# Patient Record
Sex: Male | Born: 1935 | Race: White | Hispanic: No | Marital: Married | Smoking: Never smoker
Health system: Southern US, Community
[De-identification: ages and names within clinical notes are randomized; demographics above are authoritative.]

## PROBLEM LIST (undated history)

## (undated) DIAGNOSIS — I2510 Atherosclerotic heart disease of native coronary artery without angina pectoris: Secondary | ICD-10-CM

## (undated) DIAGNOSIS — I351 Nonrheumatic aortic (valve) insufficiency: Secondary | ICD-10-CM

## (undated) DIAGNOSIS — I493 Ventricular premature depolarization: Secondary | ICD-10-CM

## (undated) DIAGNOSIS — E7800 Pure hypercholesterolemia, unspecified: Secondary | ICD-10-CM

## (undated) DIAGNOSIS — I10 Essential (primary) hypertension: Secondary | ICD-10-CM

## (undated) DIAGNOSIS — I255 Ischemic cardiomyopathy: Secondary | ICD-10-CM

## (undated) DIAGNOSIS — I340 Nonrheumatic mitral (valve) insufficiency: Secondary | ICD-10-CM

## (undated) HISTORY — PX: CORONARY ARTERY BYPASS GRAFT: SHX141

## (undated) HISTORY — DX: Nonrheumatic mitral (valve) insufficiency: I34.0

## (undated) HISTORY — DX: Pure hypercholesterolemia, unspecified: E78.00

## (undated) HISTORY — DX: Atherosclerotic heart disease of native coronary artery without angina pectoris: I25.10

## (undated) HISTORY — DX: Essential (primary) hypertension: I10

## (undated) HISTORY — PX: OTHER SURGICAL HISTORY: SHX169

## (undated) HISTORY — DX: Nonrheumatic aortic (valve) insufficiency: I35.1

## (undated) HISTORY — PX: LUNG SURGERY: SHX703

## (undated) HISTORY — DX: Ischemic cardiomyopathy: I25.5

## (undated) HISTORY — DX: Ventricular premature depolarization: I49.3

---

## 1998-01-27 ENCOUNTER — Inpatient Hospital Stay (HOSPITAL_COMMUNITY): Admission: EM | Admit: 1998-01-27 | Discharge: 1998-01-28 | Payer: Self-pay | Admitting: Emergency Medicine

## 1998-01-27 ENCOUNTER — Encounter: Payer: Self-pay | Admitting: *Deleted

## 2003-12-10 ENCOUNTER — Ambulatory Visit: Payer: Self-pay | Admitting: Cardiology

## 2003-12-16 ENCOUNTER — Ambulatory Visit: Payer: Self-pay | Admitting: Cardiology

## 2004-09-15 ENCOUNTER — Ambulatory Visit: Payer: Self-pay | Admitting: Cardiology

## 2004-09-20 ENCOUNTER — Ambulatory Visit: Payer: Self-pay

## 2004-12-04 ENCOUNTER — Ambulatory Visit: Payer: Self-pay | Admitting: Cardiology

## 2005-03-22 ENCOUNTER — Ambulatory Visit: Payer: Self-pay | Admitting: Internal Medicine

## 2005-04-24 ENCOUNTER — Ambulatory Visit: Payer: Self-pay | Admitting: Internal Medicine

## 2005-06-05 ENCOUNTER — Ambulatory Visit: Payer: Self-pay | Admitting: Internal Medicine

## 2005-10-22 ENCOUNTER — Ambulatory Visit: Payer: Self-pay | Admitting: Internal Medicine

## 2005-10-22 LAB — CONVERTED CEMR LAB
ALT: 21 units/L (ref 0–40)
AST: 19 units/L (ref 0–37)
Albumin: 3.4 g/dL — ABNORMAL LOW (ref 3.5–5.2)
Alkaline Phosphatase: 59 units/L (ref 39–117)
BUN: 9 mg/dL (ref 6–23)
Basophils Absolute: 0.1 10*3/uL (ref 0.0–0.1)
Basophils Relative: 0.6 % (ref 0.0–1.0)
Bilirubin Urine: NEGATIVE
CO2: 30 meq/L (ref 19–32)
Calcium: 9.1 mg/dL (ref 8.4–10.5)
Chloride: 105 meq/L (ref 96–112)
Chol/HDL Ratio, serum: 3.3
Cholesterol: 137 mg/dL (ref 0–200)
Creatinine, Ser: 0.9 mg/dL (ref 0.4–1.5)
Eosinophil percent: 4.4 % (ref 0.0–5.0)
GFR calc non Af Amer: 89 mL/min
Glomerular Filtration Rate, Af Am: 107 mL/min/{1.73_m2}
Glucose, Bld: 166 mg/dL — ABNORMAL HIGH (ref 70–99)
HCT: 42.8 % (ref 39.0–52.0)
HDL: 41.5 mg/dL (ref >39.0)
Hemoglobin, Urine: NEGATIVE
Hemoglobin: 14.4 g/dL (ref 13.0–17.0)
Hgb A1c MFr Bld: 7 % — ABNORMAL HIGH (ref 4.6–6.0)
Ketones, ur: NEGATIVE mg/dL
LDL Cholesterol: 75 mg/dL (ref 0–99)
Leukocytes, UA: NEGATIVE
Lymphocytes Relative: 22.9 % (ref 12.0–46.0)
MCHC: 33.6 g/dL (ref 30.0–36.0)
MCV: 94.3 fL (ref 78.0–100.0)
Monocytes Absolute: 0.6 10*3/uL (ref 0.2–0.7)
Monocytes Relative: 5.6 % (ref 3.0–11.0)
Neutro Abs: 7.1 10*3/uL (ref 1.4–7.7)
Neutrophils Relative %: 66.5 % (ref 43.0–77.0)
Nitrite: NEGATIVE
PSA: 0.52 ng/mL (ref 0.10–4.00)
Platelets: 255 10*3/uL (ref 150–400)
Potassium: 5.4 meq/L — ABNORMAL HIGH (ref 3.5–5.1)
RBC: 4.54 M/uL (ref 4.22–5.81)
RDW: 12.2 % (ref 11.5–14.6)
Sodium: 141 meq/L (ref 135–145)
Specific Gravity, Urine: 1.02 (ref 1.000–1.03)
TSH: 2.48 microintl units/mL (ref 0.35–5.50)
Total Bilirubin: 0.6 mg/dL (ref 0.3–1.2)
Total Protein, Urine: NEGATIVE mg/dL
Total Protein: 6.4 g/dL (ref 6.0–8.3)
Triglyceride fasting, serum: 102 mg/dL (ref 0–149)
Urine Glucose: NEGATIVE mg/dL
Urobilinogen, UA: 0.2 (ref 0.0–1.0)
VLDL: 20 mg/dL (ref 0–40)
WBC: 10.8 10*3/uL — ABNORMAL HIGH (ref 4.5–10.5)
pH: 5.5 (ref 5.0–8.0)

## 2005-11-28 ENCOUNTER — Ambulatory Visit: Payer: Self-pay | Admitting: Cardiology

## 2006-11-28 ENCOUNTER — Ambulatory Visit: Payer: Self-pay | Admitting: Cardiology

## 2006-12-04 ENCOUNTER — Ambulatory Visit: Payer: Self-pay | Admitting: Cardiology

## 2006-12-04 LAB — CONVERTED CEMR LAB
ALT: 19 units/L (ref 0–53)
AST: 19 units/L (ref 0–37)
Albumin: 3.4 g/dL — ABNORMAL LOW (ref 3.5–5.2)
Alkaline Phosphatase: 59 units/L (ref 39–117)
Bilirubin, Direct: 0.1 mg/dL (ref 0.0–0.3)
Cholesterol: 147 mg/dL (ref 0–200)
HDL: 49 mg/dL (ref >39.0)
LDL Cholesterol: 82 mg/dL (ref 0–99)
Total Bilirubin: 0.8 mg/dL (ref 0.3–1.2)
Total CHOL/HDL Ratio: 3
Total Protein: 6.5 g/dL (ref 6.0–8.3)
Triglycerides: 78 mg/dL (ref 0–149)
VLDL: 16 mg/dL (ref 0–40)

## 2008-01-14 ENCOUNTER — Ambulatory Visit: Payer: Self-pay | Admitting: Cardiology

## 2008-01-14 LAB — CONVERTED CEMR LAB
ALT: 21 units/L (ref 0–53)
AST: 18 units/L (ref 0–37)
Albumin: 3.5 g/dL (ref 3.5–5.2)
Alkaline Phosphatase: 49 units/L (ref 39–117)
Bilirubin, Direct: 0.1 mg/dL (ref 0.0–0.3)
Cholesterol: 118 mg/dL (ref 0–200)
HDL: 44.8 mg/dL (ref >39.0)
LDL Cholesterol: 58 mg/dL (ref 0–99)
Total Bilirubin: 0.8 mg/dL (ref 0.3–1.2)
Total CHOL/HDL Ratio: 2.6
Total Protein: 6.8 g/dL (ref 6.0–8.3)
Triglycerides: 74 mg/dL (ref 0–149)
VLDL: 15 mg/dL (ref 0–40)

## 2008-01-16 ENCOUNTER — Ambulatory Visit: Payer: Self-pay | Admitting: Cardiology

## 2008-05-06 ENCOUNTER — Encounter: Payer: Self-pay | Admitting: Cardiology

## 2008-06-08 ENCOUNTER — Encounter: Payer: Self-pay | Admitting: Cardiology

## 2009-01-20 DIAGNOSIS — E7800 Pure hypercholesterolemia, unspecified: Secondary | ICD-10-CM | POA: Insufficient documentation

## 2009-01-20 DIAGNOSIS — I25810 Atherosclerosis of coronary artery bypass graft(s) without angina pectoris: Secondary | ICD-10-CM | POA: Insufficient documentation

## 2009-01-21 DIAGNOSIS — I10 Essential (primary) hypertension: Secondary | ICD-10-CM | POA: Insufficient documentation

## 2009-01-21 DIAGNOSIS — E119 Type 2 diabetes mellitus without complications: Secondary | ICD-10-CM | POA: Insufficient documentation

## 2009-02-03 ENCOUNTER — Ambulatory Visit: Payer: Self-pay | Admitting: Cardiology

## 2009-02-07 ENCOUNTER — Ambulatory Visit: Payer: Self-pay | Admitting: Cardiology

## 2009-02-15 LAB — CONVERTED CEMR LAB
ALT: 23 units/L (ref 0–53)
AST: 19 units/L (ref 0–37)
Albumin: 3.5 g/dL (ref 3.5–5.2)
Alkaline Phosphatase: 50 units/L (ref 39–117)
Bilirubin, Direct: 0.1 mg/dL (ref 0.0–0.3)
Cholesterol: 121 mg/dL (ref 0–200)
HDL: 49.5 mg/dL (ref >39.00)
LDL Cholesterol: 58 mg/dL (ref 0–99)
Total Bilirubin: 0.5 mg/dL (ref 0.3–1.2)
Total CHOL/HDL Ratio: 2
Total Protein: 6.5 g/dL (ref 6.0–8.3)
Triglycerides: 69 mg/dL (ref 0.0–149.0)
VLDL: 13.8 mg/dL (ref 0.0–40.0)

## 2009-02-17 ENCOUNTER — Encounter (INDEPENDENT_AMBULATORY_CARE_PROVIDER_SITE_OTHER): Payer: Self-pay

## 2009-04-26 ENCOUNTER — Ambulatory Visit: Payer: Self-pay | Admitting: Cardiology

## 2010-01-15 ENCOUNTER — Emergency Department (HOSPITAL_COMMUNITY)
Admission: EM | Admit: 2010-01-15 | Discharge: 2010-01-15 | Payer: Self-pay | Source: Home / Self Care | Admitting: Emergency Medicine

## 2010-01-23 LAB — STREP A DNA PROBE: Group A Strep Probe: NEGATIVE

## 2010-01-23 LAB — RAPID STREP SCREEN (MED CTR MEBANE ONLY): Streptococcus, Group A Screen (Direct): NEGATIVE

## 2010-01-29 ENCOUNTER — Encounter: Payer: Self-pay | Admitting: Internal Medicine

## 2010-02-09 NOTE — Letter (Signed)
Summary: [place], [place] [address] [place]   [place], [place] [address]   Phone: [phone]  Fax: [phone]     February 17, 2009 MRN: [id number]   [place], [place]  [id number]   Dear Mr. THALHEIMER,  We have reviewed your cholesterol results.  They are as follows:     Total Cholesterol:    121 (Desirable: less than 200)       HDL  Cholesterol:     49.50  (Desirable: greater than 40 for men and 50 for women)       LDL Cholesterol:       58  (Desirable: less than 100 for low risk and less than 70 for moderate to high risk)       Triglycerides:       69.0  (Desirable: less than 150)  Our recommendations include: These are excellent and I would continue current regimen as outlined.  Liver function is normal. TS   Call our office at the number listed above if you have any questions.  Lowering your LDL cholesterol is important, but it is only one of a large number of "risk factors" that may indicate that you are at risk for heart disease, stroke or other complications of hardening of the arteries.  Other risk factors include:   A.  Cigarette Smoking* B.  High Blood Pressure* C.  Obesity* D.   Low HDL Cholesterol (see yours above)* E.   Diabetes Mellitus (higher risk if your is uncontrolled) F.  Family history of premature heart disease G.  Previous history of stroke or cardiovascular disease    *These are risk factors YOU HAVE CONTROL OVER.  For more information, visit .  There is now evidence that lowering the TOTAL CHOLESTEROL AND LDL CHOLESTEROL can reduce the risk of heart disease.  The American Heart Association recommends the following guidelines for the treatment of elevated cholesterol:  1.  If there is now current heart disease and less than two risk factors, TOTAL CHOLESTEROL should be less than 200 and LDL CHOLESTEROL should be less than 100. 2.  If there is current heart disease or two or more  risk factors, TOTAL CHOLESTEROL should be less than 200 and LDL CHOLESTEROL should be less than 70.  A diet low in cholesterol, saturated fat, and calories is the cornerstone of treatment for elevated cholesterol.  Cessation of smoking and exercise are also important in the management of elevated cholesterol and preventing vascular disease.  Studies have shown that 30 to 60 minutes of physical activity most days can help lower blood pressure, lower cholesterol, and keep your weight at a healthy level.  Drug therapy is used when cholesterol levels do not respond to therapeutic lifestyle changes (smoking cessation, diet, and exercise) and remains unacceptably high.  If medication is started, it is important to have you levels checked periodically to evaluate the need for further treatment options.  Thank you,  Theodosia Quay RN-BSN [employer]

## 2010-02-09 NOTE — Assessment & Plan Note (Signed)
[id number]      Allergies Added: NKDA  Visit Type:  1 year follow up  CC:  No complains.  History of Present Illness: Does regular exercise without difficulty.  He denies chest pain.  Had CABG 1996, and PCI in 2000 with non DES.  Has not had any symptoms.  Last GXT 2006.  Current Medications (verified): 1)  Hydrocodone-Acetaminophen 5-500 Mg Tabs (Hydrocodone-Acetaminophen) .... Take 1 Tablet By Mouth Four Times A Day 2)  Metformin Hcl 500 Mg Tabs (Metformin Hcl) .... Take 1 Tablet By Mouth Twice A Day 3)  Robaxin-750 750 Mg Tabs (Methocarbamol) .... Take 1 Tablet By Mouth Twice A Day 4)  Metoprolol Tartrate 50 Mg Tabs (Metoprolol Tartrate) .... Take 1/2 Tablet Two Times A Day 5)  Crestor 20 Mg Tabs (Rosuvastatin Calcium) .Marland Kitchen.. 1 Tab Once Daily  Allergies (verified): No Known Drug Allergies  Vital Signs:  Patient profile:   76 year old male Height:      69 inches Weight:      184.50 pounds BMI:     27.34 Pulse rate:   57 / minute Pulse rhythm:   regular Resp:     18 per minute BP sitting:   180 / 70  (left arm) Cuff size:   large  Vitals Entered By: Sidney Ace (February 03, 2009 9:07 AM)  Physical Exam  General:  Well developed, well nourished, in no acute distress. Head:  normocephalic and atraumatic Neck:  Neck supple, no JVD. No masses, thyromegaly or abnormal cervical nodes. Lungs:  Decrease BS with slight prolonged expiration. Heart:  PMI non displaced.  No murmur rub or gallop. Pulses:  pulses normal in all 4 extremities   EKG  Procedure date:  02/03/2009  Findings:      NSR.  Nonspecific iv block.  Non specific ST and T abnl.  Impression & Recommendations:  Problem # 1:  CAD, ARTERY BYPASS GRAFT (ICD-414.04) Prior CABG, then PCI of native RCA.  Symptoms stable.  Will do GXT  (last 5 years ago) as he is physically active. His updated medication list for this problem includes:    Metoprolol Tartrate 50 Mg Tabs (Metoprolol tartrate) .Marland Kitchen... Take 1/2  tablet two times a day  Orders: Treadmill (Treadmill) EKG w/ Interpretation (93000)  Problem # 2:  HYPERCHOLESTEROLEMIA (ICD-272.0) Needs lipid and liver profile. His updated medication list for this problem includes:    Crestor 20 Mg Tabs (Rosuvastatin calcium) .Marland Kitchen... 1 tab once daily  Orders: Treadmill (Treadmill) EKG w/ Interpretation (93000)  His updated medication list for this problem includes:    Crestor 20 Mg Tabs (Rosuvastatin calcium) .Marland Kitchen... 1 tab once daily  Problem # 3:  DM (ICD-250.00)  Per Dr Jenny Reichmann. His updated medication list for this problem includes:    Metformin Hcl 500 Mg Tabs (Metformin hcl) .Marland Kitchen... Take 1 tablet by mouth twice a day  His updated medication list for this problem includes:    Metformin Hcl 500 Mg Tabs (Metformin hcl) .Marland Kitchen... Take 1 tablet by mouth twice a day  Patient Instructions: 1)  Your physician recommends that you return for a FASTING LIPID and LIVER PROFILE (414.01, 272.0, v58.69) 2)  Your physician has requested that you have an exercise tolerance test in 3-6 MONTHS.  For further information please visit [email or web address].  Please also follow instruction sheet, as given. 3)  Your physician recommends that you continue on your current medications as directed. Please refer to the Current Medication list given to you today.

## 2010-05-02 ENCOUNTER — Other Ambulatory Visit: Payer: Self-pay | Admitting: Cardiology

## 2010-05-08 ENCOUNTER — Telehealth: Payer: Self-pay | Admitting: Cardiology

## 2010-05-08 NOTE — Telephone Encounter (Signed)
Pt wife calling to see if he needs blood work before appt 5-2?

## 2010-05-08 NOTE — Telephone Encounter (Signed)
This pt can have a lipid and liver profile drawn prior to appointment. I spoke with the pt and he said he would just wait until after his appointment to have labs drawn.

## 2010-05-09 ENCOUNTER — Encounter: Payer: Self-pay | Admitting: Cardiology

## 2010-05-10 ENCOUNTER — Ambulatory Visit (INDEPENDENT_AMBULATORY_CARE_PROVIDER_SITE_OTHER): Payer: Medicare Other | Admitting: Cardiology

## 2010-05-10 ENCOUNTER — Encounter: Payer: Self-pay | Admitting: Cardiology

## 2010-05-10 VITALS — BP 150/70 | HR 72 | Ht 70.0 in | Wt 187.0 lb

## 2010-05-10 DIAGNOSIS — E7800 Pure hypercholesterolemia, unspecified: Secondary | ICD-10-CM

## 2010-05-10 DIAGNOSIS — E785 Hyperlipidemia, unspecified: Secondary | ICD-10-CM

## 2010-05-10 DIAGNOSIS — I25810 Atherosclerosis of coronary artery bypass graft(s) without angina pectoris: Secondary | ICD-10-CM

## 2010-05-10 DIAGNOSIS — I10 Essential (primary) hypertension: Secondary | ICD-10-CM

## 2010-05-10 NOTE — Assessment & Plan Note (Signed)
Has been well controlled in the past.  His serial studies have been given to him.  We will recheck next week for followup.

## 2010-05-10 NOTE — Patient Instructions (Signed)
Your physician recommends that you schedule a follow-up appointment in: 1 year with Dr. Lia Foyer  Your physician recommends that you return for a FASTING lipid profile on Monday 04/15/10.

## 2010-05-10 NOTE — Assessment & Plan Note (Signed)
No angina.. Patient had CABG then later in 2000 PCI of native RCA.  No current symptoms. Had PLA stenosis dilated, and had patent grafts at that time.

## 2010-05-10 NOTE — Progress Notes (Signed)
HPI:  He is in for one year follow up.  He says he is about the same.  Denies any chest pain or shortness of breath beyond his usual cough associated with his bronchiectasis.  Does whatever he wants.  Sees Dr. Jenny Reichmann only when he has a problem.  Current Outpatient Prescriptions  Medication Sig Dispense Refill  . CRESTOR 20 MG tablet TAKE 1 TABLET BY MOUTH EVERY DAY  30 tablet  10  . metFORMIN (GLUCOPHAGE) 500 MG tablet Take 500 mg by mouth 2 (two) times daily with a meal.        . metoprolol (LOPRESSOR) 50 MG tablet TAKE 1/2 TABLET TWICE DAILY  30 tablet  10  . DISCONTD: HYDROcodone-acetaminophen (VICODIN) 5-500 MG per tablet Take 1 tablet by mouth every 4 (four) hours.       Marland Kitchen DISCONTD: methocarbamol (ROBAXIN) 750 MG tablet Take 750 mg by mouth 2 (two) times daily.          No Known Allergies  Past Medical History  Diagnosis Date  . Hypertension   . Coronary artery disease     coronary artery bypass graft  . Diabetes mellitus   . Hypercholesterolemia     non-insulin dependent    Past Surgical History  Procedure Date  . Coronary artery bypass graft   . Lung surgery   . Other surgical history     percutaneous coronary intervention of the  posterolateral segment on 01/27/1998    No family history on file.  History   Social History  . Marital Status: Married    Spouse Name: N/A    Number of Children: N/A  . Years of Education: N/A   Occupational History  . Not on file.   Social History Main Topics  . Smoking status: Never Smoker   . Smokeless tobacco: Not on file  . Alcohol Use: No  . Drug Use: No  . Sexually Active: Not on file   Other Topics Concern  . Not on file   Social History Narrative  . No narrative on file    ROS: Please see the HPI.  All other systems reviewed and negative.  PHYSICAL EXAM:  BP 150/70  Pulse 72  Ht 5\' 10"  (1.778 m)  Wt 187 lb (84.823 kg)  BMI 26.83 kg/m2  General: Well developed, well nourished, in no acute distress. Head:   Normocephalic and atraumatic. Neck: no JVD Lungs:SLight ronchii at the bases.  Heart: Normal S1 and S2.  No murmur, rubs or gallops.  Abdomen:  Normal bowel sounds; soft; non tender; no organomegaly Pulses: Pulses normal in all 4 extremities. Extremities: No clubbing or cyanosis. No edema. Neurologic: Alert and oriented x 3.  EKG:  NSR. Nonspecific IVCD.  Non specific T abnormality  ASSESSMENT AND PLAN:

## 2010-05-10 NOTE — Assessment & Plan Note (Signed)
Controlled at present.  

## 2010-05-15 ENCOUNTER — Other Ambulatory Visit: Payer: Medicare Other | Admitting: *Deleted

## 2010-05-23 NOTE — Assessment & Plan Note (Signed)
[place] OFFICE NOTE   SHIV, LAUER                     MRN:          [id number]  DATE:11/28/2006                            DOB:          1933/11/09    Mr. Philip Kerr is in for a followup visit.  In general, this gentleman has  been stable.  He has not been having any ongoing chest pain or  significant shortness of breath.  His blood pressures at home have been  running in the [id number] systolic range.  He has not had his lipids checked.  He continues to work.   PHYSICAL EXAMINATION:  VITAL SIGNS:  Blood pressure 160/76, pulse 63.  On repeat by me, it is 150/80.  LUNGS:  The lung fields are clear to auscultation and percussion.  There  is decreased breath sounds, particularly in the left base, compatible  with the known hemidiaphragm elevation.  CARDIAC:  Without a significant murmur.  EXTREMITIES:  No edema.   Mr. Philip Kerr is now many years following coronary revascularization  surgery.  Last catheterization was done in 2000, and at that time the  patient had implantation of a non-drug-eluting stents in the distal  right coronary artery to protect the posterolateral system which had not  been grafted.  The grafts themselves were widely patent.  He has  continued to do well from a medical standpoint.  He remains on aspirin,  Crestor, and beta blockade.  Lipid profile will be obtained.  He will  follow up with in cardiology clinic in 1 year.  Continued followup with  Dr. Jenny Reichmann is recommended.     Loretha Brasil. Lia Foyer, MD, [facility]  Electronically Signed    TDS/MedQ  DD: 11/28/2006  DT: 11/29/2006  Job #: [id number]

## 2010-05-23 NOTE — Assessment & Plan Note (Signed)
[place] OFFICE NOTE   Philip, Kerr                     MRN:          [id number]  DATE:01/16/2008                            DOB:          October 21, 1933    Philip Kerr is in for followup.  He is doing quite well.  He denies any  ongoing chest pain or shortness of breath.  He always has had a low-  grade cough since his surgery in 1955.  He has an elevated left  hemidiaphragm.  His blood pressures when he checks them at the grocery  store anywhere else generally run in the 116-120 range.  It is somewhat  higher today.   His medications include:  1. Multivitamin daily.  2. Lopressor 50 mg one-half tablet b.i.d.  3. Enteric-coated aspirin 81 mg daily.  4. Vitamin C daily.  5. Crestor 20 mg daily.   On physical, he is alert and oriented in no distress.  Blood pressure is  168/76, pulse 60.  Lung fields clear.  Cardiac rhythm is regular.  There  is an S4 gallop.  There is decreased breath sounds in the left base with  a surgical incision over the left posterior chest.   Electrocardiogram demonstrates normal sinus rhythm.  There is one  premature beat.  There is nonspecific interventricular conduction delay.   Laboratory studies include a bilirubin of 0.8, SGOT of 18, SGPT of 21.  Total cholesterol of 118, LDL of 58, and HDL of 44.8.   IMPRESSION:  1. Coronary artery disease status post coronary bypass graft surgery.  2. Patent grafts at last catheterization in 2000.  3. Status post percutaneous coronary intervention of the      posterolateral segment on January 27, 1998.  4. Hypercholesterolemia on lipid-lowering therapy at target.   PLAN:  1. Return to clinic in 1 year.  2. Continue follow up with Dr. Jenny Reichmann.  3. Continue current medical regimen.     Loretha Brasil. Lia Foyer, MD, [facility]  Electronically Signed    TDS/MedQ  DD: 01/16/2008  DT: 01/16/2008  Job #: [id number]

## 2011-04-05 ENCOUNTER — Encounter: Payer: Self-pay | Admitting: Cardiology

## 2011-04-05 ENCOUNTER — Ambulatory Visit (INDEPENDENT_AMBULATORY_CARE_PROVIDER_SITE_OTHER): Payer: Medicare Other | Admitting: Cardiology

## 2011-04-05 VITALS — BP 162/62 | HR 51 | Ht 70.0 in | Wt 180.4 lb

## 2011-04-05 DIAGNOSIS — I2510 Atherosclerotic heart disease of native coronary artery without angina pectoris: Secondary | ICD-10-CM

## 2011-04-05 DIAGNOSIS — I10 Essential (primary) hypertension: Secondary | ICD-10-CM

## 2011-04-05 DIAGNOSIS — E7800 Pure hypercholesterolemia, unspecified: Secondary | ICD-10-CM

## 2011-04-05 DIAGNOSIS — I25810 Atherosclerosis of coronary artery bypass graft(s) without angina pectoris: Secondary | ICD-10-CM

## 2011-04-05 NOTE — Patient Instructions (Signed)
Your physician recommends that you return for a FASTING LIPID and LIVER Profile--nothing to eat or drink after midnight, lab opens at 8:30  Your physician recommends that you continue on your current medications as directed. Please refer to the Current Medication list given to you today.  Your physician wants you to follow-up in: 1 YEAR.  You will receive a reminder letter in the mail two months in advance. If you don't receive a letter, please call our office to schedule the follow-up appointment.

## 2011-04-05 NOTE — Progress Notes (Signed)
   HPI:  Stable.  No pain.  Has to have a tooth out and they insisted on cardiology clearance.  He can go up a hill near his house and does a mile every day in less than twelve minutes.  Still has mild cough.  Visit was precipitated by need for tooth removal.  Feels good.    Current Outpatient Prescriptions  Medication Sig Dispense Refill  . aspirin 81 MG tablet Take 81 mg by mouth daily.      . CRESTOR 20 MG tablet TAKE 1 TABLET BY MOUTH EVERY DAY  30 tablet  10  . fish oil-omega-3 fatty acids 1000 MG capsule Take 1 g by mouth daily.      . metoprolol (LOPRESSOR) 50 MG tablet TAKE 1/2 TABLET TWICE DAILY  30 tablet  10  . vitamin C (ASCORBIC ACID) 500 MG tablet Take 500 mg by mouth daily.        No Known Allergies  Past Medical History  Diagnosis Date  . Hypertension   . Coronary artery disease     coronary artery bypass graft  . Diabetes mellitus   . Hypercholesterolemia     non-insulin dependent    Past Surgical History  Procedure Date  . Coronary artery bypass graft   . Lung surgery   . Other surgical history     percutaneous coronary intervention of the  posterolateral segment on 01/27/1998    No family history on file.  History   Social History  . Marital Status: Married    Spouse Name: N/A    Number of Children: N/A  . Years of Education: N/A   Occupational History  . Not on file.   Social History Main Topics  . Smoking status: Never Smoker   . Smokeless tobacco: Not on file  . Alcohol Use: No  . Drug Use: No  . Sexually Active: Not on file   Other Topics Concern  . Not on file   Social History Narrative  . No narrative on file    ROS: Please see the HPI.  All other systems reviewed and negative.  PHYSICAL EXAM:  BP 162/62  Pulse 51  Ht 5\' 10"  (1.778 m)  Wt 180 lb 6.4 oz (81.829 kg)  BMI 25.88 kg/m2  General: Well developed, well nourished, in no acute distress. Head:  Normocephalic and atraumatic. Neck: no JVD Sternotomy looks good.     Lungs: Clear to auscultation and percussion.  Minimal ronchii.   Heart: Normal S1 and S2.  No murmur, rubs or gallops.  Abdomen:  Normal bowel sounds; soft; non tender; no organomegaly Pulses: Pulses normal in all 4 extremities. Extremities: No clubbing or cyanosis. No edema. Neurologic: Alert and oriented x 3.  EKG:  NSR.  LBBB  (similar to old tracing)  ASSESSMENT AND PLAN:

## 2011-04-05 NOTE — Assessment & Plan Note (Signed)
Able to walk well in excessive of four mets.  Low risk procedure.  No further testing warranted.  Continues to do well.

## 2011-04-05 NOTE — Assessment & Plan Note (Signed)
Do for lipid and liver.

## 2011-04-05 NOTE — Assessment & Plan Note (Signed)
Mildly elevated, but will have follow up with Dr. Jenny Reichmann in near future.  If systolics remain high, then consider addition to meds.  Wide pulse pressure but I cannot appreciate AI.

## 2011-04-11 ENCOUNTER — Other Ambulatory Visit: Payer: Medicare Other

## 2011-04-11 ENCOUNTER — Other Ambulatory Visit: Payer: Self-pay | Admitting: Cardiology

## 2011-05-11 ENCOUNTER — Ambulatory Visit: Payer: Medicare Other | Admitting: Cardiology

## 2012-03-31 ENCOUNTER — Other Ambulatory Visit (INDEPENDENT_AMBULATORY_CARE_PROVIDER_SITE_OTHER): Payer: Medicare Other

## 2012-03-31 DIAGNOSIS — E7800 Pure hypercholesterolemia, unspecified: Secondary | ICD-10-CM

## 2012-03-31 DIAGNOSIS — I2510 Atherosclerotic heart disease of native coronary artery without angina pectoris: Secondary | ICD-10-CM

## 2012-03-31 DIAGNOSIS — I10 Essential (primary) hypertension: Secondary | ICD-10-CM

## 2012-03-31 LAB — HEPATIC FUNCTION PANEL
ALT: 18 U/L (ref 0–53)
AST: 19 U/L (ref 0–37)
Albumin: 3.6 g/dL (ref 3.5–5.2)
Alkaline Phosphatase: 51 U/L (ref 39–117)
Bilirubin, Direct: 0.1 mg/dL (ref 0.0–0.3)
Total Bilirubin: 0.5 mg/dL (ref 0.3–1.2)
Total Protein: 6.7 g/dL (ref 6.0–8.3)

## 2012-03-31 LAB — LIPID PANEL
Cholesterol: 128 mg/dL (ref 0–200)
HDL: 42 mg/dL (ref >39.00)
LDL Cholesterol: 62 mg/dL (ref 0–99)
Total CHOL/HDL Ratio: 3
Triglycerides: 119 mg/dL (ref 0.0–149.0)
VLDL: 23.8 mg/dL (ref 0.0–40.0)

## 2012-04-03 ENCOUNTER — Ambulatory Visit: Payer: Medicare Other | Admitting: Cardiology

## 2012-04-15 ENCOUNTER — Other Ambulatory Visit: Payer: Self-pay | Admitting: *Deleted

## 2012-04-15 MED ORDER — ROSUVASTATIN CALCIUM 20 MG PO TABS: 20.0000 mg | ORAL_TABLET | Freq: Every day | ORAL | Status: DC

## 2012-04-15 MED ORDER — METOPROLOL TARTRATE 50 MG PO TABS: 25.0000 mg | ORAL_TABLET | Freq: Two times a day (BID) | ORAL | Status: DC

## 2012-04-16 ENCOUNTER — Other Ambulatory Visit: Payer: Self-pay | Admitting: *Deleted

## 2012-04-16 MED ORDER — ROSUVASTATIN CALCIUM 20 MG PO TABS: 20.0000 mg | ORAL_TABLET | Freq: Every day | ORAL | Status: DC

## 2012-04-21 ENCOUNTER — Ambulatory Visit (INDEPENDENT_AMBULATORY_CARE_PROVIDER_SITE_OTHER): Payer: Medicare Other | Admitting: Cardiology

## 2012-04-21 ENCOUNTER — Encounter: Payer: Self-pay | Admitting: Cardiology

## 2012-04-21 VITALS — BP 150/64 | HR 63 | Ht 70.0 in | Wt 187.0 lb

## 2012-04-21 DIAGNOSIS — I10 Essential (primary) hypertension: Secondary | ICD-10-CM

## 2012-04-21 DIAGNOSIS — E7800 Pure hypercholesterolemia, unspecified: Secondary | ICD-10-CM

## 2012-04-21 DIAGNOSIS — R011 Cardiac murmur, unspecified: Secondary | ICD-10-CM

## 2012-04-21 DIAGNOSIS — I2510 Atherosclerotic heart disease of native coronary artery without angina pectoris: Secondary | ICD-10-CM

## 2012-04-21 NOTE — Progress Notes (Addendum)
   HPI:  This nice patient is in today for followup visit. He continues to do well from a clinical standpoint, denying chest pain or progressive shortness of breath. He does have a cough related to his prior history of bronchiectasis and lung resection, and he does note that this is perhaps slightly more than he has had in the past. Nonetheless, he is many years out from coronary revascularization surgery, and continuing to do well from a clinical standpoint  Current Outpatient Prescriptions  Medication Sig Dispense Refill  . aspirin 81 MG tablet Take 81 mg by mouth daily.      . fish oil-omega-3 fatty acids 1000 MG capsule Take 1 g by mouth daily.      . metoprolol (LOPRESSOR) 50 MG tablet Take 0.5 tablets (25 mg total) by mouth 2 (two) times daily.  30 tablet  10  . rosuvastatin (CRESTOR) 20 MG tablet Take 1 tablet (20 mg total) by mouth daily.  30 tablet  10  . vitamin C (ASCORBIC ACID) 500 MG tablet Take 500 mg by mouth daily.       No current facility-administered medications for this visit.    No Known Allergies  Past Medical History  Diagnosis Date  . Hypertension   . Coronary artery disease     coronary artery bypass graft  . Diabetes mellitus   . Hypercholesterolemia     non-insulin dependent    Past Surgical History  Procedure Laterality Date  . Coronary artery bypass graft    . Lung surgery    . Other surgical history      percutaneous coronary intervention of the  posterolateral segment on 01/27/1998    No family history on file.  History   Social History  . Marital Status: Married    Spouse Name: N/A    Number of Children: N/A  . Years of Education: N/A   Occupational History  . Not on file.   Social History Main Topics  . Smoking status: Never Smoker   . Smokeless tobacco: Not on file  . Alcohol Use: No  . Drug Use: No  . Sexually Active: Not on file   Other Topics Concern  . Not on file   Social History Narrative  . No narrative on file     ROS: Please see the HPI.  All other systems reviewed and negative.  PHYSICAL EXAM:  BP 150/64  Pulse 63  Ht 5\' 10"  (1.778 m)  Wt 187 lb (84.823 kg)  BMI 26.83 kg/m2  SpO2 98%  General: Well developed, well nourished, in no acute distress. Head:  Normocephalic and atraumatic. Neck: no JVD Lungs:  Bilateral mild ronchii, slightly worse in the R lung ---prior thoracotomy incision.   Heart: Normal S1 and S2.  Short apical murmur, noted laterally, new from last visit.   Abdomen:  Normal bowel sounds; soft; non tender; no organomegaly.  Firm abdomen.   Pulses: Pulses normal in all 4 extremities. Extremities: No clubbing or cyanosis. No edema. Neurologic: Alert and oriented x 3.  EKG:  NSR.  Occasional PVCs.  IVCD, unchanged.  Nonspecific T changes.  From last tracing, slightly more inferolateral T inversion, cannot exclude ischemia.    ASSESSMENT AND PLAN:

## 2012-04-21 NOTE — Patient Instructions (Signed)
Your physician wants you to follow-up in:  12 months with Dr. Angelena Form. You will receive a reminder letter in the mail two months in advance. If you don't receive a letter, please call our office to schedule the follow-up appointment.  Your physician has requested that you have an echocardiogram. Echocardiography is a painless test that uses sound waves to create images of your heart. It provides your doctor with information about the size and shape of your heart and how well your heart's chambers and valves are working. This procedure takes approximately one hour. There are no restrictions for this procedure.

## 2012-04-21 NOTE — Assessment & Plan Note (Signed)
Values are at target.  Continue current meds.

## 2012-04-21 NOTE — Assessment & Plan Note (Addendum)
He is stable with class I symptoms.  He has borderline ECG changes that are hard to interpret in setting of IVCD, and they are nonspecific.  Will get a 2 D echo in light of new murmur, and assess LV function.  Will also get information from the warehouse regarding prior CABG data as this is not available.  He had CABG in 1996 and PCI in 2000---only information likely to be in warehouse chart.  Depending on this data, will decide on further workup.    See overview:  Old chart received and reviewed.

## 2012-04-21 NOTE — Assessment & Plan Note (Signed)
Borderline elevated.

## 2012-04-21 NOTE — Assessment & Plan Note (Signed)
Will check 2D echo to assess LV function--and as noted.  Will reevaluate.

## 2012-04-24 ENCOUNTER — Ambulatory Visit (HOSPITAL_COMMUNITY): Payer: Medicare Other | Attending: Cardiology | Admitting: Radiology

## 2012-04-24 ENCOUNTER — Other Ambulatory Visit: Payer: Self-pay

## 2012-04-24 DIAGNOSIS — R011 Cardiac murmur, unspecified: Secondary | ICD-10-CM

## 2012-04-24 DIAGNOSIS — I2510 Atherosclerotic heart disease of native coronary artery without angina pectoris: Secondary | ICD-10-CM

## 2012-04-24 NOTE — Progress Notes (Signed)
Echocardiogram performed.  

## 2012-05-13 ENCOUNTER — Telehealth: Payer: Self-pay | Admitting: Cardiology

## 2012-05-13 NOTE — Telephone Encounter (Signed)
New problem   Patient returning  Nurse called.

## 2012-05-13 NOTE — Telephone Encounter (Signed)
Pt was made aware of echo results and understands he needs a f/u app with Dr Lia Foyer. Pt was told that his nurse will call back to fit him into Dr Maren Beach schedule. Pt agreed to plan.

## 2012-05-14 NOTE — Telephone Encounter (Signed)
I spoke with the pt's wife and appointment scheduled on 05/15/12 with Dr Lia Foyer.

## 2012-05-15 ENCOUNTER — Encounter: Payer: Self-pay | Admitting: Cardiology

## 2012-05-15 ENCOUNTER — Ambulatory Visit (INDEPENDENT_AMBULATORY_CARE_PROVIDER_SITE_OTHER): Payer: Medicare Other | Admitting: Cardiology

## 2012-05-15 VITALS — BP 150/60 | HR 63 | Ht 70.0 in | Wt 182.0 lb

## 2012-05-15 DIAGNOSIS — I10 Essential (primary) hypertension: Secondary | ICD-10-CM

## 2012-05-15 DIAGNOSIS — I2510 Atherosclerotic heart disease of native coronary artery without angina pectoris: Secondary | ICD-10-CM

## 2012-05-15 NOTE — Assessment & Plan Note (Signed)
The patient has an abnormal echocardiogram with an inferolateral lateral wall motion abnormality. His last catheterization was in the year 2000, and the Information is not in epic. We are pulling his prior chart were reviewed correlate this. He will undergo radionuclide imaging to better assess myocardial perfusion, and the potential etiology of his reduction in overall left ventricular ejection fraction. He is agreeable to this evaluation, although it should be noted that he is symptomatically not changed from the way he was doing this was found on incidental echocardiogram as he had not had any left ventricular function data over the last decade. Hopefully this information will be helpful

## 2012-05-15 NOTE — Patient Instructions (Addendum)
Your physician has requested that you have an exercise stress myoview. For further information please visit [email or web address]. Please follow instruction sheet, as given.  Your physician recommends that you schedule a follow-up appointment with Dr Lia Foyer on June 13, 2012 at 10:30.  Your physician recommends that you continue on your current medications as directed. Please refer to the Current Medication list given to you today.

## 2012-05-15 NOTE — Progress Notes (Signed)
HPI:  The patient came in today to review his data in detail.  I sent the patient down and his monitor, and we reviewed the echo in detail. He clearly shows a moderately large inferolateral wall motion abnormality was some reduction in overall left ventricular function.  His cath data, and his last surgical data is not currently in epic, but we are having his chart pulled. The patient underwent revascularization surgery in the 1990s, and his continued to remain relatively stable. He does have a chronic cough related to bronchiectasis, has had absolutely no cardiac symptoms, it would be considered class I to at most. There is been no significant interval change. Nonetheless, he clearly has reduced overall left ventricular ejection fraction, and we have discussed further evaluation in detail.  Current Outpatient Prescriptions  Medication Sig Dispense Refill  . aspirin 81 MG tablet Take 81 mg by mouth daily.      . fish oil-omega-3 fatty acids 1000 MG capsule Take 1 g by mouth daily.      . metoprolol (LOPRESSOR) 50 MG tablet Take 0.5 tablets (25 mg total) by mouth 2 (two) times daily.  30 tablet  10  . rosuvastatin (CRESTOR) 20 MG tablet Take 1 tablet (20 mg total) by mouth daily.  30 tablet  10  . vitamin C (ASCORBIC ACID) 500 MG tablet Take 500 mg by mouth daily.       No current facility-administered medications for this visit.    No Known Allergies  Past Medical History  Diagnosis Date  . Hypertension   . Coronary artery disease     coronary artery bypass graft  . Diabetes mellitus   . Hypercholesterolemia     non-insulin dependent    Past Surgical History  Procedure Laterality Date  . Coronary artery bypass graft    . Lung surgery    . Other surgical history      percutaneous coronary intervention of the  posterolateral segment on 01/27/1998    No family history on file.  History   Social History  . Marital Status: Married    Spouse Name: N/A    Number of Children: N/A    . Years of Education: N/A   Occupational History  . Not on file.   Social History Main Topics  . Smoking status: Never Smoker   . Smokeless tobacco: Not on file  . Alcohol Use: No  . Drug Use: No  . Sexually Active: Not on file   Other Topics Concern  . Not on file   Social History Narrative  . No narrative on file    ROS: Please see the HPI.  All other systems reviewed and negative.  PHYSICAL EXAM:  BP 150/60  Pulse 63  Ht 5\' 10"  (1.778 m)  Wt 182 lb (82.555 kg)  BMI 26.11 kg/m2  SpO2 99%  No specific exam today.  Review only.    EKG:  Not done.  ECHO    Study Conclusions  - Left ventricle: The cavity size was mildly dilated. Wall thickness was normal. The estimated ejection fraction was 35%. Posterior akinesis and basal inferior hypokinesis. Features are consistent with a pseudonormal left ventricular filling pattern, with concomitant abnormal relaxation and increased filling pressure (grade 2 diastolic dysfunction). - Aortic valve: There was no stenosis. Mild regurgitation. - Mitral valve: Moderate regurgitation. There is restriction of the posterior leaflet likely related to the inferoposterior wall motion abnormality. Suspect infarct-related MR. Effective regurgitant orifice: 0.24cm^2 (PISA). - Left atrium: The atrium  was moderately dilated. - Right ventricle: The cavity size was normal. Systolic function was normal. - Tricuspid valve: Peak RV-RA gradient: 56mm Hg (S). - Pulmonary arteries: PA systolic pressure 0000000 mmHg. - Systemic veins: IVC measured 2.0 cmwith normal respirophasic variation, suggesting RA pressure 6-10 mmHg. Impressions:  - Mildly dilated LV with moderately decreased systolic function, [id number]. Posterior akinesis, basal inferior severe hypokinesis. Moderate diastolic dysfunction. Moderate MR (probably infarct-related MR). Mild pulmonary hypertension. Normal RV size and systolic function.     ASSESSMENT AND PLAN:  The  patient has an abnormal echocardiogram as noted. Based upon the findings, even though he is asymptomatic I think it would be helpful to have a radionuclide imaging study to assess the potential for myocardial ischemia and evidence of scar and wall motion abnormality. His ejection fraction is clearly on the borderline with regard to consideration for ICD. With these factors in mind, it makes sense to go ahead, and the patient is totally in agreement with proceeding on with exercise radionuclide imaging to get a better handle on why his overall ejection fraction is significantly reduced at this point. It certainly it is not accompanied by any major symptoms, so we will need to keep this in mind.  We do know that he had 5 out of 5 grafts open and 2000, and at he had a stent placed into the distal right coronary artery, and the current echo findings to suggest the possible change in status of some of the graft to either his distal right, and/or the native vessel. Once we get the radionuclide information, and have his Pulled out, we will able to correlate these and I scheduled to be seen back in followup in June 6.

## 2012-05-20 ENCOUNTER — Ambulatory Visit (HOSPITAL_COMMUNITY): Payer: Medicare Other | Attending: Cardiology | Admitting: Radiology

## 2012-05-20 VITALS — Ht 70.0 in | Wt 178.0 lb

## 2012-05-20 DIAGNOSIS — R0609 Other forms of dyspnea: Secondary | ICD-10-CM | POA: Insufficient documentation

## 2012-05-20 DIAGNOSIS — E119 Type 2 diabetes mellitus without complications: Secondary | ICD-10-CM | POA: Insufficient documentation

## 2012-05-20 DIAGNOSIS — Z9861 Coronary angioplasty status: Secondary | ICD-10-CM | POA: Insufficient documentation

## 2012-05-20 DIAGNOSIS — R002 Palpitations: Secondary | ICD-10-CM | POA: Insufficient documentation

## 2012-05-20 DIAGNOSIS — I25810 Atherosclerosis of coronary artery bypass graft(s) without angina pectoris: Secondary | ICD-10-CM

## 2012-05-20 DIAGNOSIS — Z951 Presence of aortocoronary bypass graft: Secondary | ICD-10-CM | POA: Insufficient documentation

## 2012-05-20 DIAGNOSIS — I2510 Atherosclerotic heart disease of native coronary artery without angina pectoris: Secondary | ICD-10-CM

## 2012-05-20 DIAGNOSIS — R0602 Shortness of breath: Secondary | ICD-10-CM

## 2012-05-20 DIAGNOSIS — I10 Essential (primary) hypertension: Secondary | ICD-10-CM | POA: Insufficient documentation

## 2012-05-20 DIAGNOSIS — E785 Hyperlipidemia, unspecified: Secondary | ICD-10-CM | POA: Insufficient documentation

## 2012-05-20 DIAGNOSIS — R0989 Other specified symptoms and signs involving the circulatory and respiratory systems: Secondary | ICD-10-CM | POA: Insufficient documentation

## 2012-05-20 MED ORDER — TECHNETIUM TC 99M SESTAMIBI GENERIC - CARDIOLITE
11.0000 | Freq: Once | INTRAVENOUS | Status: AC | PRN
  Administered 2012-05-20: 11 via INTRAVENOUS

## 2012-05-20 MED ORDER — TECHNETIUM TC 99M SESTAMIBI GENERIC - CARDIOLITE
33.0000 | Freq: Once | INTRAVENOUS | Status: AC | PRN
  Administered 2012-05-20: 33 via INTRAVENOUS

## 2012-05-20 NOTE — Progress Notes (Signed)
[facility] [address] [place], [place] [address] [phone]    Cardiology Nuclear Med Study  Philip Kerr is a 79 y.o. male     MRN : [id number]     DOB: 12/23/1933  Procedure Date: 05/20/2012  Nuclear Med Background Indication for Stress Test:  Evaluation for Ischemia, Graft Patency and Stent Patency History:  1990's CABG, '00 Cath: patent grafts, PTCA/Stent RCA, and 04-2012 Echo: EF=35%, wall motion abnormality Cardiac Risk Factors: Hypertension, Lipids and NIDDM  Symptoms:  DOE and Palpitations   Nuclear Pre-Procedure Caffeine/Decaff Intake:  None > 12 hrs NPO After: 11:00pm   Lungs:  clear O2 Sat: 98% on room air. IV 0.9% NS with Angio Cath:  20g  IV Site: R Antecubital x 1, tolerated well IV Started by:  Irven Baltimore, RN  Chest Size (in):  40 Cup Size: n/a  Height: 5\' 10"  (1.778 m)  Weight:  178 lb (80.74 kg)  BMI:  Body mass index is 25.54 kg/(m^2). Tech Comments:  Held lopressor x 36 hrs    Nuclear Med Study 1 or 2 day study: 1 day  Stress Test Type:  Stress  Reading MD: Darlin Coco, MD  Order Authorizing Provider:  Bing Quarry, MD  Resting Radionuclide: Technetium 66m Sestamibi  Resting Radionuclide Dose: 11.0 mCi   Stress Radionuclide:  Technetium 35m Sestamibi  Stress Radionuclide Dose: 33.0 mCi           Stress Protocol Rest HR: 72 Stress HR: 137  Rest BP: 132/67 Stress BP: 190/65  Exercise Time (min): 6:45 METS: 8.1   Predicted Max HR: 144 bpm % Max HR: 95.14 bpm Rate Pressure Product: 26030   Dose of Adenosine (mg):  n/a Dose of Lexiscan: n/a mg  Dose of Atropine (mg): n/a Dose of Dobutamine: n/a mcg/kg/min (at max HR)  Stress Test Technologist: Irven Baltimore, RN  Nuclear Technologist:  Charlton Amor, CNMT     Rest Procedure:  Myocardial perfusion imaging was performed at rest 45 minutes following the intravenous administration of Technetium 58m Sestamibi. Rest ECG: Frequent PVCs, gating not  done.  Stress Procedure:  The patient exercised on the treadmill utilizing the Bruce Protocol for 6:45 minutes, RPE=15. The patient stopped due to DOE and denied any chest pain. There were frequent PVC's, bigeminy @ baseline that continued with exercise. The patient took metoprolol 50 mg 1/2 tablet after recovery.  Technetium 53m Sestamibi was injected at peak exercise and myocardial perfusion imaging was performed after a brief delay. Dr. Lia Foyer reviewed the EKG's and preliminary images and discussed with the patient.  Stress ECG: No significant change from baseline ECG  QPS Raw Data Images:  Patient motion noted. Stress Images:  Normal homogeneous uptake in all areas of the myocardium. Rest Images:  Normal homogeneous uptake in all areas of the myocardium. Subtraction (SDS):  No evidence of ischemia. Transient Ischemic Dilatation (Normal <1.22):  1.00 Lung/Heart Ratio (Normal <0.45):  0.36  Quantitative Gated Spect Images QGS EDV:  n/a QGS ESV:  n/a  Impression Exercise Capacity:  Fair exercise capacity. BP Response:  Normal blood pressure response. Clinical Symptoms:  No chest pain. ECG Impression:  There are scattered PVCs. Comparison with Prior Nuclear Study: No previous nuclear study performed  Overall Impression:  There is decreased uptake in high lateral wall seen in both stress and rest images of the horizontal plane images only.  Not seen on other views. There is motion artefact. No evidence of ischemia. No gating  secondary to frequent PVCs. Suggest echo to evaluate LV systolic function.  LV Ejection Fraction: Study not gated.  LV Wall Motion:  Study not gated   [employer]

## 2012-06-13 ENCOUNTER — Ambulatory Visit (INDEPENDENT_AMBULATORY_CARE_PROVIDER_SITE_OTHER): Payer: Medicare Other | Admitting: Cardiology

## 2012-06-13 ENCOUNTER — Encounter: Payer: Self-pay | Admitting: Cardiology

## 2012-06-13 VITALS — BP 128/58 | HR 57 | Ht 70.0 in | Wt 180.8 lb

## 2012-06-13 DIAGNOSIS — I255 Ischemic cardiomyopathy: Secondary | ICD-10-CM

## 2012-06-13 DIAGNOSIS — I2589 Other forms of chronic ischemic heart disease: Secondary | ICD-10-CM

## 2012-06-13 DIAGNOSIS — I5022 Chronic systolic (congestive) heart failure: Secondary | ICD-10-CM | POA: Insufficient documentation

## 2012-06-13 DIAGNOSIS — E7800 Pure hypercholesterolemia, unspecified: Secondary | ICD-10-CM

## 2012-06-13 DIAGNOSIS — I2510 Atherosclerotic heart disease of native coronary artery without angina pectoris: Secondary | ICD-10-CM

## 2012-06-13 NOTE — Patient Instructions (Signed)
Your physician wants you to follow-up in: 6 MONTHS with Dr Angelena Form.  You will receive a reminder letter in the mail two months in advance. If you don't receive a letter, please call our office to schedule the follow-up appointment.  Your physician recommends that you continue on your current medications as directed. Please refer to the Current Medication list given to you today.

## 2012-06-13 NOTE — Assessment & Plan Note (Signed)
I discussed findings with patient in detail.  No ischemia noted on nuclear, but EF is down.  Patient had normal LV in 2000.  May have occluded graft and or native vessel to the posterior myocardium.  Given EF drop, some consideration to cath, but patient asymptomatic and not really interested in pursuing at this time.

## 2012-06-13 NOTE — Assessment & Plan Note (Signed)
Currently noted by echo with no def ischemia.  I suggested consideration of ARB given EF drop and long standing cough.  He is not interested in ARB and/or further wu at this time.  This can be readdressed at six months follow up office visit.

## 2012-06-13 NOTE — Assessment & Plan Note (Signed)
Currently nicely controlled on medication.  Would continue statin life long unless recommendations change.

## 2012-06-13 NOTE — Progress Notes (Signed)
HPI:  I asked him to come in to review the results of his studies.  We had a thorough discussion regarding the findings.  I reviewed the images of his nuclear scan with him, and discussed the echo results.  He feels well.  He denies chest pain, or any progressive shortness of breath or change in status.  He went for a a mile today.  He is doing well overall.    Current Outpatient Prescriptions  Medication Sig Dispense Refill  . aspirin 81 MG tablet Take 81 mg by mouth daily.      . fish oil-omega-3 fatty acids 1000 MG capsule Take 1 g by mouth daily.      . metoprolol (LOPRESSOR) 50 MG tablet Take 0.5 tablets (25 mg total) by mouth 2 (two) times daily.  30 tablet  10  . rosuvastatin (CRESTOR) 20 MG tablet Take 1 tablet (20 mg total) by mouth daily.  30 tablet  10  . vitamin C (ASCORBIC ACID) 500 MG tablet Take 500 mg by mouth daily.       No current facility-administered medications for this visit.    No Known Allergies  Past Medical History  Diagnosis Date  . Hypertension   . Coronary artery disease     coronary artery bypass graft  . Diabetes mellitus   . Hypercholesterolemia     non-insulin dependent    Past Surgical History  Procedure Laterality Date  . Coronary artery bypass graft    . Lung surgery    . Other surgical history      percutaneous coronary intervention of the  posterolateral segment on 01/27/1998    No family history on file.  History   Social History  . Marital Status: Married    Spouse Name: N/A    Number of Children: N/A  . Years of Education: N/A   Occupational History  . Not on file.   Social History Main Topics  . Smoking status: Never Smoker   . Smokeless tobacco: Not on file  . Alcohol Use: No  . Drug Use: No  . Sexually Active: Not on file   Other Topics Concern  . Not on file   Social History Narrative  . No narrative on file    ROS: Please see the HPI.  All other systems reviewed and negative.  PHYSICAL EXAM:  BP 128/58   Pulse 57  Ht 5\' 10"  (1.778 m)  Wt 180 lb 12.8 oz (82.01 kg)  BMI 25.94 kg/m2  SpO2 99%  General: Well developed, well nourished, in no acute distress.  Chronic cough.   Head:  Normocephalic and atraumatic. Neck: no JVD Lungs: Clear to auscultation and percussion. Heart: Normal S1 and S2. Trace AI murmur.  I do not appreciate an MR murmur.   Pulses: Pulses normal in all 4 extremities. Extremities: No clubbing or cyanosis. No edema. Neurologic: Alert and oriented x 3.  EKG:  Not done today.   ECHO  Study Conclusions  - Left ventricle: The cavity size was mildly dilated. Wall thickness was normal. The estimated ejection fraction was 35%. Posterior akinesis and basal inferior hypokinesis. Features are consistent with a pseudonormal left ventricular filling pattern, with concomitant abnormal relaxation and increased filling pressure (grade 2 diastolic dysfunction). - Aortic valve: There was no stenosis. Mild regurgitation. - Mitral valve: Moderate regurgitation. There is restriction of the posterior leaflet likely related to the inferoposterior wall motion abnormality. Suspect infarct-related MR. Effective regurgitant orifice: 0.24cm^2 (PISA). - Left atrium: The  atrium was moderately dilated. - Right ventricle: The cavity size was normal. Systolic function was normal. - Tricuspid valve: Peak RV-RA gradient: 48mm Hg (S). - Pulmonary arteries: PA systolic pressure 0000000 mmHg. - Systemic veins: IVC measured 2.0 cmwith normal respirophasic variation, suggesting RA pressure 6-10 mmHg. Impressions:  - Mildly dilated LV with moderately decreased systolic function, [id number]. Posterior akinesis, basal inferior severe hypokinesis. Moderate diastolic dysfunction. Moderate MR (probably infarct-related MR). Mild pulmonary hypertension. Normal RV size and systolic function.  NUCLEAR STUDY  Impression  Exercise Capacity: Fair exercise capacity.  BP Response: Normal blood pressure  response.  Clinical Symptoms: No chest pain.  ECG Impression: There are scattered PVCs.  Comparison with Prior Nuclear Study: No previous nuclear study performed  Overall Impression: There is decreased uptake in high lateral wall seen in both stress and rest images of the horizontal plane images only. Not seen on other views. There is motion artefact. No evidence of ischemia. No gating secondary to frequent PVCs. Suggest echo to evaluate LV systolic function.  LV Ejection Fraction: Study not gated. LV Wall Motion: Study not gated  [employer]   ASSESSMENT AND PLAN:  1.  Coronary artery disease sp CABG with class I symptoms  -- however EF is 35% by echo, with no definite ischemia.   2.  Ischemic cardiomyopathy  --  Discussed ACE or ARB with patient and he would like to defer given how he feels.   3.  History of bronchiectasis.    Discussed at length with patient.  No new meds.  FU with Dr. Angelena Form in six months.

## 2013-01-20 ENCOUNTER — Encounter: Payer: Self-pay | Admitting: Cardiovascular Disease

## 2013-01-20 ENCOUNTER — Ambulatory Visit (INDEPENDENT_AMBULATORY_CARE_PROVIDER_SITE_OTHER): Payer: Medicare HMO | Admitting: Cardiovascular Disease

## 2013-01-20 VITALS — BP 156/56 | HR 67 | Ht 70.0 in | Wt 181.0 lb

## 2013-01-20 DIAGNOSIS — I2510 Atherosclerotic heart disease of native coronary artery without angina pectoris: Secondary | ICD-10-CM

## 2013-01-20 DIAGNOSIS — I255 Ischemic cardiomyopathy: Secondary | ICD-10-CM

## 2013-01-20 DIAGNOSIS — E7800 Pure hypercholesterolemia, unspecified: Secondary | ICD-10-CM

## 2013-01-20 DIAGNOSIS — I2589 Other forms of chronic ischemic heart disease: Secondary | ICD-10-CM

## 2013-01-20 MED ORDER — LOSARTAN POTASSIUM 50 MG PO TABS
50.0000 mg | ORAL_TABLET | Freq: Every day | ORAL | Status: DC
Start: 2013-01-20 — End: 2014-01-22

## 2013-01-20 NOTE — Progress Notes (Signed)
History of Present Illness: 80 yo male with history of CAD s/p 5V CABG 1996, HTN, HLD, DM here today for cardiac follow up. He has been followed in the past by Dr. Lia Foyer. Echo 2014 with LVEF=35%, mild AI, moderate MR. Stress myoview May 2014 without ischemia, lateral scar. Long discussion at that time with Dr. Lia Foyer as noted. Pt was not interested in a cath at that time. Also refused ARB.   He is here today for follow up. Feeling well overall. No chest pain, SOB or LE edema. He is very active.   Primary Care Physician: Cathlean Cower  Last Lipid Profile:Lipid Panel     Component Value Date/Time   CHOL 128 03/31/2012 0838   TRIG 119.0 03/31/2012 0838   HDL 42.00 03/31/2012 0838   CHOLHDL 3 03/31/2012 0838   VLDL 23.8 03/31/2012 0838   LDLCALC 62 03/31/2012 0838     Past Medical History  Diagnosis Date  . Hypertension   . Coronary artery disease     coronary artery bypass graft  . Diabetes mellitus   . Hypercholesterolemia     non-insulin dependent    Past Surgical History  Procedure Laterality Date  . Coronary artery bypass graft    . Lung surgery    . Other surgical history      percutaneous coronary intervention of the  posterolateral segment on 01/27/1998    Current Outpatient Prescriptions  Medication Sig Dispense Refill  . aspirin 81 MG tablet Take 81 mg by mouth daily.      . fish oil-omega-3 fatty acids 1000 MG capsule Take 1 g by mouth daily.      . metoprolol (LOPRESSOR) 50 MG tablet Take 0.5 tablets (25 mg total) by mouth 2 (two) times daily.  30 tablet  10  . rosuvastatin (CRESTOR) 20 MG tablet Take 1 tablet (20 mg total) by mouth daily.  30 tablet  10  . vitamin C (ASCORBIC ACID) 500 MG tablet Take 500 mg by mouth daily.       No current facility-administered medications for this visit.    No Known Allergies  History   Social History  . Marital Status: Married    Spouse Name: N/A    Number of Children: N/A  . Years of Education: N/A   Occupational  History  . Not on file.   Social History Main Topics  . Smoking status: Never Smoker   . Smokeless tobacco: Not on file  . Alcohol Use: No  . Drug Use: No  . Sexual Activity: Not on file   Other Topics Concern  . Not on file   Social History Narrative  . No narrative on file    No family history on file.  Review of Systems:  As stated in the HPI and otherwise negative.   BP 156/56  Pulse 67  Ht 5\' 10"  (1.778 m)  Wt 181 lb (82.101 kg)  BMI 25.97 kg/m2  Physical Examination: General: Well developed, well nourished, NAD HEENT: OP clear, mucus membranes moist SKIN: warm, dry. No rashes. Neuro: No focal deficits Musculoskeletal: Muscle strength 5/5 all ext Psychiatric: Mood and affect normal Neck: No JVD, no carotid bruits, no thyromegaly, no lymphadenopathy. Lungs:Clear bilaterally, no wheezes, rhonci, crackles Cardiovascular: Regular rate and rhythm. No murmurs, gallops or rubs. Abdomen:Soft. Bowel sounds present. Non-tender.  Extremities: No lower extremity edema. Pulses are 2 + in the bilateral DP/PT.  EKG: Sinus, rate 67 bpm. PVC. LBBB  Echo 04/24/12: Left ventricle: The  cavity size was mildly dilated. Wall thickness was normal. The estimated ejection fraction was 35%. Posterior akinesis and basal inferior hypokinesis. Features are consistent with a pseudonormal left ventricular filling pattern, with concomitant abnormal relaxation and increased filling pressure (grade 2 diastolic dysfunction). - Aortic valve: There was no stenosis. Mild regurgitation. - Mitral valve: Moderate regurgitation. There is restriction of the posterior leaflet likely related to the inferoposterior wall motion abnormality. Suspect infarct-related MR. Effective regurgitant orifice: 0.24cm^2 (PISA). - Left atrium: The atrium was moderately dilated. - Right ventricle: The cavity size was normal. Systolic function was normal. - Tricuspid valve: Peak RV-RA gradient: 82mm Hg (S). -  Pulmonary arteries: PA systolic pressure 0000000 mmHg. - Systemic veins: IVC measured 2.0 cmwith normal respirophasic variation, suggesting RA pressure 6-10 mmHg. Impressions:  - Mildly dilated LV with moderately decreased systolic function, [id number]. Posterior akinesis, basal inferior severe hypokinesis. Moderate diastolic dysfunction. Moderate MR (probably infarct-related MR). Mild pulmonary hypertension. Normal RV size and systolic function.  Stress myoview 05/20/12: Stress Procedure: The patient exercised on the treadmill utilizing the Bruce Protocol for 6:45 minutes, RPE=15. The patient stopped due to DOE and denied any chest pain. There were frequent PVC's, bigeminy @ baseline that continued with exercise. The patient took metoprolol 50 mg 1/2 tablet after recovery. Technetium 4m Sestamibi was injected at peak exercise and myocardial perfusion imaging was performed after a brief delay. Dr. Lia Foyer reviewed the EKG's and preliminary images and discussed with the patient.  Stress ECG: No significant change from baseline ECG  QPS  Raw Data Images: Patient motion noted.  Stress Images: Normal homogeneous uptake in all areas of the myocardium.  Rest Images: Normal homogeneous uptake in all areas of the myocardium.  Subtraction (SDS): No evidence of ischemia.  Transient Ischemic Dilatation (Normal <1.22): 1.00  Lung/Heart Ratio (Normal <0.45): 0.36  Quantitative Gated Spect Images  QGS EDV: n/a  QGS ESV: n/a  Impression  Exercise Capacity: Fair exercise capacity.  BP Response: Normal blood pressure response.  Clinical Symptoms: No chest pain.  ECG Impression: There are scattered PVCs.  Comparison with Prior Nuclear Study: No previous nuclear study performed  Overall Impression: There is decreased uptake in high lateral wall seen in both stress and rest images of the horizontal plane images only. Not seen on other views. There is motion artefact. No evidence of ischemia. No gating secondary  to frequent PVCs. Suggest echo to evaluate LV systolic function.  LV Ejection Fraction: Study not gated. LV Wall Motion: Study not gated  Assessment and Plan:   1. Coronary artery disease: Stable at this time. No ischemia noted on stress test 2014. LVEF did drop but patient and Dr. Lia Foyer discussed and he refused cath. He is not having angina. Continue ASA, statin, beta blocker  2. Cardiomyopathy, ischemic: LVEF=35%. Continue beta blocker and will start Cozaar 50 mg po Qdaily.  3. Hyperlipidemia: Currently controlled on statin. .       4. 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 Now LBBB

## 2013-01-20 NOTE — Patient Instructions (Signed)
Your physician wants you to follow-up in:  12 months.  You will receive a reminder letter in the mail two months in advance. If you don't receive a letter, please call our office to schedule the follow-up appointment.  Your physician has recommended you make the following change in your medication:  Start Cozaar 50 mg by mouth daily

## 2013-04-18 ENCOUNTER — Other Ambulatory Visit: Payer: Self-pay | Admitting: Cardiology

## 2013-06-09 ENCOUNTER — Other Ambulatory Visit: Payer: Self-pay | Admitting: Cardiology

## 2013-10-27 ENCOUNTER — Encounter: Payer: Self-pay | Admitting: Nurse Practitioner

## 2013-10-27 ENCOUNTER — Encounter (INDEPENDENT_AMBULATORY_CARE_PROVIDER_SITE_OTHER): Payer: Commercial Managed Care - HMO

## 2013-10-27 ENCOUNTER — Ambulatory Visit (INDEPENDENT_AMBULATORY_CARE_PROVIDER_SITE_OTHER): Payer: Commercial Managed Care - HMO | Admitting: Nurse Practitioner

## 2013-10-27 VITALS — BP 140/78 | HR 97 | Ht 70.0 in | Wt 184.0 lb

## 2013-10-27 DIAGNOSIS — I255 Ischemic cardiomyopathy: Secondary | ICD-10-CM

## 2013-10-27 DIAGNOSIS — I493 Ventricular premature depolarization: Secondary | ICD-10-CM

## 2013-10-27 DIAGNOSIS — I2510 Atherosclerotic heart disease of native coronary artery without angina pectoris: Secondary | ICD-10-CM

## 2013-10-27 LAB — HEPATIC FUNCTION PANEL
ALT: 24 U/L (ref 0–53)
AST: 23 U/L (ref 0–37)
Albumin: 3.2 g/dL — ABNORMAL LOW (ref 3.5–5.2)
Alkaline Phosphatase: 58 U/L (ref 39–117)
Bilirubin, Direct: 0.1 mg/dL (ref 0.0–0.3)
Total Bilirubin: 0.7 mg/dL (ref 0.2–1.2)
Total Protein: 6.8 g/dL (ref 6.0–8.3)

## 2013-10-27 LAB — BASIC METABOLIC PANEL
BUN: 14 mg/dL (ref 6–23)
CO2: 27 mEq/L (ref 19–32)
Calcium: 8.5 mg/dL (ref 8.4–10.5)
Chloride: 105 mEq/L (ref 96–112)
Creatinine, Ser: 1.1 mg/dL (ref 0.4–1.5)
GFR: 72.59 mL/min (ref >60.00)
Glucose, Bld: 113 mg/dL — ABNORMAL HIGH (ref 70–99)
Potassium: 3.3 mEq/L — ABNORMAL LOW (ref 3.5–5.1)
Sodium: 139 mEq/L (ref 135–145)

## 2013-10-27 LAB — LIPID PANEL
Cholesterol: 109 mg/dL (ref 0–200)
HDL: 50.3 mg/dL (ref >39.00)
LDL Cholesterol: 35 mg/dL (ref 0–99)
NonHDL: 58.7
Total CHOL/HDL Ratio: 2
Triglycerides: 120 mg/dL (ref 0.0–149.0)
VLDL: 24 mg/dL (ref 0.0–40.0)

## 2013-10-27 NOTE — Patient Instructions (Addendum)
We will be checking the following labs today BMET, Lipids, HPF  We will place a holter monitor  We will update your echocardiogram  See Dr. Angelena Form in 2 weeks  Stay on your current medicines for now  Call the [facility] office at [phone] if you have any questions, problems or concerns.

## 2013-10-27 NOTE — Progress Notes (Signed)
Philip Kerr Date of Birth: [birth year] Medical Record [id number]  History of Present Illness: Philip Kerr is seen back today for a work in visit. Seen for Dr. Angelena Form. Former patient of Dr. Maren Beach. He has known CAD with remote CABG x 5 back in 1996, HTN, HLD, and DM.   Echo from 2014 showed EF down to 35% with mild AI, moderate MR. Stress Myoview from May of 2014 was without ischemia, but with lateral scar. Patient refused ARB therapy at that time and was not interested in cardiac cath.   Comes in today. Here alone. Not very happy about being here. Says his wife got all worried about him and called up here. He says he is fine now. He tells me that Dr. Lia Foyer had him "take a drink every night" before going to bed. A week ago, he was at the [facility] store and the worker sold him a new brand. The next day, felt sick - was wobbly. Had some swelling. He stopped this last night and is now "just fine". Really wants nothing else done. Tells me that he always "has a skip" in his heart and has a "bad lung". Not smoking. He has had no recent labs. Does not appear to be seeing PCP.   Current Outpatient Prescriptions  Medication Sig Dispense Refill  . aspirin 81 MG tablet Take 81 mg by mouth daily.      Marland Kitchen losartan (COZAAR) 50 MG tablet Take 1 tablet (50 mg total) by mouth daily.  90 tablet  3  . metoprolol (LOPRESSOR) 50 MG tablet TAKE 1/2 TABLET BY MOUTH TWICE A DAY  30 tablet  9  . rosuvastatin (CRESTOR) 20 MG tablet Take 1 tablet (20 mg total) by mouth daily.  30 tablet  10  . vitamin C (ASCORBIC ACID) 500 MG tablet Take 500 mg by mouth daily.       No current facility-administered medications for this visit.    No Known Allergies  Past Medical History  Diagnosis Date  . Hypertension   . Coronary artery disease     coronary artery bypass graft x 5 in 1996  . Diabetes mellitus   . Hypercholesterolemia     non-insulin dependent    Past Surgical History  Procedure Laterality Date  .  Coronary artery bypass graft    . Lung surgery    . Other surgical history      percutaneous coronary intervention of the  posterolateral segment on 01/27/1998    History  Smoking status  . Never Smoker   Smokeless tobacco  . Not on file    History  Alcohol Use No    Family History  Problem Relation Age of Onset  . CAD Neg Hx     Review of Systems: The review of systems is per the HPI.  All other systems were reviewed and are negative.  Physical Exam: BP 140/78  Pulse 97  Ht 5\' 10"  (1.778 m)  Wt 184 lb (83.462 kg)  BMI 26.40 kg/m2  SpO2 77% Patient is alert and in no acute distress. Skin is warm and dry. Color is normal.  HEENT is unremarkable but very poor dentition. Normocephalic/atraumatic. PERRL. Sclera are nonicteric. Neck is supple. No masses. No JVD. Lungs are coarse. Cardiac exam shows an riregular rhythm. Rate is a little faster. Abdomen is soft. Extremities are without edema. Gait and ROM are intact. No gross neurologic deficits noted.  Wt Readings from Last 3 Encounters:  10/27/13 184 lb (83.462 kg)  01/20/13 181 lb (82.101 kg)  06/13/12 180 lb 12.8 oz (82.01 kg)    LABORATORY DATA/PROCEDURES: EKG pending   Lab Results  Component Value Date   WBC 10.8* 10/22/2005   HGB 14.4 10/22/2005   HCT 42.8 10/22/2005   PLT 255 10/22/2005   GLUCOSE 166* 10/22/2005   CHOL 128 03/31/2012   TRIG 119.0 03/31/2012   HDL 42.00 03/31/2012   LDLCALC 62 03/31/2012   ALT 18 03/31/2012   AST 19 03/31/2012   NA 141 10/22/2005   K 5.4* 10/22/2005   CL 105 10/22/2005   CREATININE 0.9 10/22/2005   BUN 9 10/22/2005   CO2 30 10/22/2005   TSH 2.48 10/22/2005   PSA 0.52 10/22/2005   HGBA1C 7.0* 10/22/2005    BNP (last 3 results) No results found for this basename: PROBNP,  in the last 8760 hours  Myoview Impression from May 2014 Exercise Capacity: Fair exercise capacity.  BP Response: Normal blood pressure response.  Clinical Symptoms: No chest pain.  ECG Impression:  There are scattered PVCs.  Comparison with Prior Nuclear Study: No previous nuclear study performed  Overall Impression: There is decreased uptake in high lateral wall seen in both stress and rest images of the horizontal plane images only. Not seen on other views. There is motion artefact. No evidence of ischemia. No gating secondary to frequent PVCs. Suggest echo to evaluate LV systolic function.  LV Ejection Fraction: Study not gated. LV Wall Motion: Study not gated  Darlin Coco   Per Dr. Maren Beach comment: "We previously reviewed with him the day he was here. I again showed him the images. There is no definite ischemia, but the inferior/posterior wall is thin. ? Silent graft occlusion. No current ischemia. I did discuss with him some consideration regarding anatomic information, but his preference is to defer, and with no new meds, and no other evaluation at this time. FU is scheduled with Dr. Angelena Form in six months."  Echo Study Conclusions from April 2014  - Left ventricle: The cavity size was mildly dilated. Wall thickness was normal. The estimated ejection fraction was 35%. Posterior akinesis and basal inferior hypokinesis. Features are consistent with a pseudonormal left ventricular filling pattern, with concomitant abnormal relaxation and increased filling pressure (grade 2 diastolic dysfunction). - Aortic valve: There was no stenosis. Mild regurgitation. - Mitral valve: Moderate regurgitation. There is restriction of the posterior leaflet likely related to the inferoposterior wall motion abnormality. Suspect infarct-related MR. Effective regurgitant orifice: 0.24cm^2 (PISA). - Left atrium: The atrium was moderately dilated. - Right ventricle: The cavity size was normal. Systolic function was normal. - Tricuspid valve: Peak RV-RA gradient: 79mm Hg (S). - Pulmonary arteries: PA systolic pressure 0000000 mmHg. - Systemic veins: IVC measured 2.0 cmwith normal respirophasic  variation, suggesting RA pressure 6-10 mmHg. Impressions:  - Mildly dilated LV with moderately decreased systolic function, EF [id number]. Posterior akinesis, basal inferior severe hypokinesis. Moderate diastolic dysfunction. Moderate MR (probably infarct-related MR). Mild pulmonary hypertension. Normal RV size and systolic function.  Per Dr. Maren Beach note: "I reviewed these findings with him in detail. I suggested that he consider ACE inhibition or perhaps more appropriately an ARB given his chronic cough. He says he feels fine and prefers not to take any new medications, or consider any intervention such as an ICD. I explained the reasons for this, but he wanted to defer. He will see Dr. Angelena Form in about six months."   Assessment / Plan: 1. Ischemic CM - not interested in further testing.  Not really happy about being here today.  Is agreeable to checking EKG and labs today. Not sure what to make of his symptoms but nevertheless he says he is better. See Dr. Angelena Form back in January as planned.   2. Remote CABG  Patient is agreeable to this plan and will call if any problems develop in the interim.   Burtis Junes, RN, [facility] [address] [place], [place]  [phone]  Addendum:  His EKG was done - shows probable sinus, has a left bundle with very frequent PVCs - he then tells me (after our visit) that he felt presyncopal last week - he still attributes this to the new rum that he bought. He says he will let me place a 24 hour Holter and get his echo updated. He is on beta blocker.   Follow up with Dr. Angelena Form in 2 weeks. Labs being checked today.

## 2013-10-28 ENCOUNTER — Ambulatory Visit (HOSPITAL_COMMUNITY): Payer: Medicare HMO | Attending: Cardiology

## 2013-10-28 ENCOUNTER — Other Ambulatory Visit: Payer: Self-pay | Admitting: *Deleted

## 2013-10-28 DIAGNOSIS — I493 Ventricular premature depolarization: Secondary | ICD-10-CM

## 2013-10-28 DIAGNOSIS — I255 Ischemic cardiomyopathy: Secondary | ICD-10-CM | POA: Diagnosis not present

## 2013-10-28 DIAGNOSIS — E785 Hyperlipidemia, unspecified: Secondary | ICD-10-CM | POA: Insufficient documentation

## 2013-10-28 DIAGNOSIS — E119 Type 2 diabetes mellitus without complications: Secondary | ICD-10-CM | POA: Insufficient documentation

## 2013-10-28 DIAGNOSIS — I10 Essential (primary) hypertension: Secondary | ICD-10-CM | POA: Diagnosis not present

## 2013-10-28 DIAGNOSIS — I2510 Atherosclerotic heart disease of native coronary artery without angina pectoris: Secondary | ICD-10-CM

## 2013-10-28 MED ORDER — POTASSIUM CHLORIDE CRYS ER 20 MEQ PO TBCR: 20.0000 meq | EXTENDED_RELEASE_TABLET | Freq: Every day | ORAL | Status: DC

## 2013-10-28 NOTE — Progress Notes (Signed)
2D Echo completed. 10/28/2013

## 2013-11-02 ENCOUNTER — Telehealth: Payer: Self-pay | Admitting: *Deleted

## 2013-11-02 NOTE — Telephone Encounter (Signed)
Monitor results reviewed by Dr. Angelena Form. Instructions given by Dr. Angelena Form for pt to increase lopressor to 50 mg by mouth twice daily and keep scheduled follow up appt with Truitt Merle, NP on November 11, 2013. I placed call to pt. Left message on home number to call office. 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

## 2013-11-03 NOTE — Telephone Encounter (Signed)
Follow up          Pt returning nurse call

## 2013-11-03 NOTE — Telephone Encounter (Signed)
Patient aware to increase metoprolol to 50mg  BID and keep appointment next week with Tera Helper. per Dr.McAlhany.

## 2013-11-11 ENCOUNTER — Encounter: Payer: Self-pay | Admitting: Nurse Practitioner

## 2013-11-11 ENCOUNTER — Ambulatory Visit (INDEPENDENT_AMBULATORY_CARE_PROVIDER_SITE_OTHER): Payer: Commercial Managed Care - HMO | Admitting: Nurse Practitioner

## 2013-11-11 VITALS — BP 138/56 | HR 58 | Ht 70.0 in | Wt 179.8 lb

## 2013-11-11 DIAGNOSIS — I255 Ischemic cardiomyopathy: Secondary | ICD-10-CM

## 2013-11-11 DIAGNOSIS — E876 Hypokalemia: Secondary | ICD-10-CM

## 2013-11-11 LAB — BASIC METABOLIC PANEL
BUN: 20 mg/dL (ref 6–23)
CO2: 24 mEq/L (ref 19–32)
Calcium: 9 mg/dL (ref 8.4–10.5)
Chloride: 105 mEq/L (ref 96–112)
Creatinine, Ser: 1.2 mg/dL (ref 0.4–1.5)
GFR: 65.35 mL/min (ref >60.00)
Glucose, Bld: 100 mg/dL — ABNORMAL HIGH (ref 70–99)
Potassium: 5 mEq/L (ref 3.5–5.1)
Sodium: 135 mEq/L (ref 135–145)

## 2013-11-11 NOTE — Progress Notes (Signed)
Philip Kerr Date of Birth: 11/19/1933 Medical Record [id number]  History of Present Illness: Mr. Wirts is seen back today for a follow up visit. Seen for Dr. Angelena Form. Former patient of Dr. Maren Beach. He has known CAD with remote CABG x 5 back in 1996, HTN, HLD, and DM.   Echo from 2014 showed EF down to 35% with mild AI, moderate MR. Stress Myoview from May of 2014 was without ischemia, but with lateral scar. Patient refused ARB therapy at that time and was not interested in cardiac cath.   I saw him about 10 days ago - hard visit to figure out what was going on. I updated his echo. Got a Holter - lots of PVCs - Dr. Angelena Form increased his beta blocker.   Comes in today. Here alone. He is feeling better. He did increase the beta blocker - made his HR go to the 40's - so he went back to his old dose. Potassium has been replaced. He now feels good. Not drinking the alcohol before bedtime as well. He is not interested in having any more tests, meds, or ICD implant.   Current Outpatient Prescriptions  Medication Sig Dispense Refill  . aspirin 81 MG tablet Take 81 mg by mouth daily.    Marland Kitchen losartan (COZAAR) 50 MG tablet Take 1 tablet (50 mg total) by mouth daily. 90 tablet 3  . metoprolol (LOPRESSOR) 50 MG tablet Take 25 mg by mouth 2 (two) times daily.     . potassium chloride SA (K-DUR,KLOR-CON) 20 MEQ tablet Take 1 tablet (20 mEq total) by mouth daily. 30 tablet 6  . rosuvastatin (CRESTOR) 20 MG tablet Take 1 tablet (20 mg total) by mouth daily. 30 tablet 10  . vitamin C (ASCORBIC ACID) 500 MG tablet Take 500 mg by mouth daily.     No current facility-administered medications for this visit.    No Known Allergies  Past Medical History  Diagnosis Date  . Hypertension   . Coronary artery disease     coronary artery bypass graft x 5 in 1996  . Diabetes mellitus   . Hypercholesterolemia     non-insulin dependent    Past Surgical History  Procedure Laterality Date  . Coronary  artery bypass graft    . Lung surgery    . Other surgical history      percutaneous coronary intervention of the  posterolateral segment on 01/27/1998    History  Smoking status  . Never Smoker   Smokeless tobacco  . Not on file    History  Alcohol Use No    Family History  Problem Relation Age of Onset  . CAD Neg Hx     Review of Systems: The review of systems is per the HPI.  All other systems were reviewed and are negative.  Physical Exam: BP 138/56 mmHg  Pulse 58  Ht 5\' 10"  (1.778 m)  Wt 179 lb 12.8 oz (81.557 kg)  BMI 25.80 kg/m2  SpO2 96% Patient is very pleasant and in no acute distress. Skin is warm and dry. Color is normal.  HEENT is unremarkable. Normocephalic/atraumatic. PERRL. Sclera are nonicteric. Neck is supple. No masses. No JVD. Lungs are clear. Cardiac exam shows a regular rate and rhythm. Abdomen is soft. Extremities are without edema. Gait and ROM are intact. No gross neurologic deficits noted.  Wt Readings from Last 3 Encounters:  11/11/13 179 lb 12.8 oz (81.557 kg)  10/27/13 184 lb (83.462 kg)  01/20/13 181 lb (82.101  kg)    LABORATORY DATA/PROCEDURES:  Lab Results  Component Value Date   WBC 10.8* 10/22/2005   HGB 14.4 10/22/2005   HCT 42.8 10/22/2005   PLT 255 10/22/2005   GLUCOSE 113* 10/27/2013   CHOL 109 10/27/2013   TRIG 120.0 10/27/2013   HDL 50.30 10/27/2013   LDLCALC 35 10/27/2013   ALT 24 10/27/2013   AST 23 10/27/2013   NA 139 10/27/2013   K 3.3* 10/27/2013   CL 105 10/27/2013   CREATININE 1.1 10/27/2013   BUN 14 10/27/2013   CO2 27 10/27/2013   TSH 2.48 10/22/2005   PSA 0.52 10/22/2005   HGBA1C 7.0* 10/22/2005    BNP (last 3 results) No results for input(s): PROBNP in the last 8760 hours.   Echo Study Conclusions from October 2015  - Left ventricle: There is apical septal dyskinesis due to paradoxical septal motion from BBB. The cavity size was normal. There was moderate concentric hypertrophy. Systolic  function was moderately reduced. The estimated ejection fraction was in the range of 35% to 40%. There is akinesis of the basalinferior myocardium. There is severe hypokinesis of the apicalinferior myocardium. There is hypokinesis of the entireinferolateral myocardium. - Ventricular septum: Septal motion showed paradox. - Aortic valve: There was trivial regurgitation. - Aorta: Ascending aortic diameter: 41 mm (S). - Ascending aorta: The ascending aorta was mildly dilated. - Mitral valve: There was moderate regurgitation. - Left atrium: The atrium was moderately dilated. - Tricuspid valve: There was mild regurgitation  Assessment / Plan: 1. Ischemic CM - not interested in further testing. He is doing well clinically. Potassium replaced. Back on old dose of his Lopressor - see back in 6 months. Advised to stay off the alcohol.  2. Remote CABG  3. Hypokalemia - recheck today.   Patient is agreeable to this plan and will call if any problems develop in the interim.   Burtis Junes, RN, [facility] [address] [place], [place]  [phone]

## 2013-11-11 NOTE — Patient Instructions (Signed)
We will be checking the following labs today BMET  Stay on your current medicines  See me in 6 months  Call the [place] office at [phone] if you have any questions, problems or concerns.

## 2013-11-11 NOTE — Addendum Note (Signed)
Addended by: Burtis Junes on: 11/11/2013 12:10 PM   Modules accepted: Orders

## 2013-11-13 ENCOUNTER — Encounter: Payer: Self-pay | Admitting: *Deleted

## 2014-01-05 ENCOUNTER — Telehealth: Payer: Self-pay | Admitting: *Deleted

## 2014-01-05 DIAGNOSIS — I255 Ischemic cardiomyopathy: Secondary | ICD-10-CM

## 2014-01-05 MED ORDER — POTASSIUM CHLORIDE CRYS ER 20 MEQ PO TBCR: 10.0000 meq | EXTENDED_RELEASE_TABLET | Freq: Every day | ORAL | Status: DC

## 2014-01-05 NOTE — Telephone Encounter (Signed)
Pt notified per Tera Helper., NP to have repeat BMET. Pt will come in 01/11/14 . Pt agreeable to plan of care.

## 2014-01-05 NOTE — Telephone Encounter (Signed)
Pt notified today of lab results from 11/2013. Pt states he has been taking 1/2 tab K+ BID. I advised per Tera Helper, NP to decrease to 1/2 tab K+ daily. I will check with Tera Helper, NP to see if she would like repeat labs and let pt know. Pt said ok and thank you

## 2014-01-11 ENCOUNTER — Other Ambulatory Visit (INDEPENDENT_AMBULATORY_CARE_PROVIDER_SITE_OTHER): Payer: Commercial Managed Care - HMO | Admitting: *Deleted

## 2014-01-11 DIAGNOSIS — I255 Ischemic cardiomyopathy: Secondary | ICD-10-CM

## 2014-01-12 LAB — BASIC METABOLIC PANEL
BUN: 17 mg/dL (ref 6–23)
CO2: 27 mEq/L (ref 19–32)
Calcium: 8.8 mg/dL (ref 8.4–10.5)
Chloride: 105 mEq/L (ref 96–112)
Creatinine, Ser: 1.1 mg/dL (ref 0.4–1.5)
GFR: 71.76 mL/min (ref >60.00)
Glucose, Bld: 109 mg/dL — ABNORMAL HIGH (ref 70–99)
Potassium: 4.1 mEq/L (ref 3.5–5.1)
Sodium: 138 mEq/L (ref 135–145)

## 2014-01-22 ENCOUNTER — Other Ambulatory Visit: Payer: Self-pay | Admitting: Cardiovascular Disease

## 2014-04-18 ENCOUNTER — Other Ambulatory Visit: Payer: Self-pay | Admitting: Cardiovascular Disease

## 2014-04-20 ENCOUNTER — Other Ambulatory Visit: Payer: Self-pay | Admitting: Cardiovascular Disease

## 2014-05-17 ENCOUNTER — Ambulatory Visit: Payer: Commercial Managed Care - HMO | Admitting: Nurse Practitioner

## 2014-05-19 ENCOUNTER — Encounter: Payer: Self-pay | Admitting: Nurse Practitioner

## 2014-05-19 ENCOUNTER — Ambulatory Visit (INDEPENDENT_AMBULATORY_CARE_PROVIDER_SITE_OTHER): Payer: Commercial Managed Care - HMO | Admitting: Nurse Practitioner

## 2014-05-19 VITALS — BP 170/60 | HR 50 | Ht 70.0 in | Wt 177.1 lb

## 2014-05-19 DIAGNOSIS — E7800 Pure hypercholesterolemia, unspecified: Secondary | ICD-10-CM

## 2014-05-19 DIAGNOSIS — I255 Ischemic cardiomyopathy: Secondary | ICD-10-CM

## 2014-05-19 DIAGNOSIS — I10 Essential (primary) hypertension: Secondary | ICD-10-CM | POA: Diagnosis not present

## 2014-05-19 LAB — LIPID PANEL
Cholesterol: 117 mg/dL (ref 0–200)
HDL: 39.3 mg/dL (ref >39.00)
LDL Cholesterol: 39 mg/dL (ref 0–99)
NonHDL: 77.7
Total CHOL/HDL Ratio: 3
Triglycerides: 195 mg/dL — ABNORMAL HIGH (ref 0.0–149.0)
VLDL: 39 mg/dL (ref 0.0–40.0)

## 2014-05-19 LAB — BASIC METABOLIC PANEL
BUN: 12 mg/dL (ref 6–23)
CO2: 28 mEq/L (ref 19–32)
Calcium: 8.9 mg/dL (ref 8.4–10.5)
Chloride: 107 mEq/L (ref 96–112)
Creatinine, Ser: 0.98 mg/dL (ref 0.40–1.50)
GFR: 78.49 mL/min (ref >60.00)
Glucose, Bld: 102 mg/dL — ABNORMAL HIGH (ref 70–99)
Potassium: 4.9 mEq/L (ref 3.5–5.1)
Sodium: 138 mEq/L (ref 135–145)

## 2014-05-19 LAB — HEPATIC FUNCTION PANEL
ALT: 11 U/L (ref 0–53)
AST: 12 U/L (ref 0–37)
Albumin: 3.5 g/dL (ref 3.5–5.2)
Alkaline Phosphatase: 54 U/L (ref 39–117)
Bilirubin, Direct: 0.1 mg/dL (ref 0.0–0.3)
Total Bilirubin: 0.3 mg/dL (ref 0.2–1.2)
Total Protein: 6.6 g/dL (ref 6.0–8.3)

## 2014-05-19 NOTE — Progress Notes (Signed)
CARDIOLOGY OFFICE NOTE  Date:  05/19/2014    Philip Kerr Date of Birth: 11/26/1933 Medical Record [id number]  PCP:  Philip Cower, MD  Cardiologist:  [facility]  Chief Complaint  Patient presents with  . Cardiomyopathy    Follow up visit - seen for Philip Kerr    History of Present Illness: Philip Kerr is a 81 y.o. male who presents today for a follow up visit. Seen for Philip Kerr. Former patient of Dr. Maren Kerr. He has known CAD with remote CABG x 5 back in 1996, HTN, HLD, and DM.   Echo from 2014 showed EF down to 35% with mild AI, moderate MR. Stress Myoview from May of 2014 was without ischemia, but with lateral scar. Patient refused ARB therapy at that time and was not interested in cardiac cath.   I saw him back in October - hard visit to figure out what was going on. I updated his echo. Got a Holter - lots of PVCs - Philip Kerr increased his beta blocker. Last seen by me in November - he had increased the beta blocker - but this made his HR go to the 40's - so he went back to his old dose. He was doing ok and feeling well at his last visit.   Not drinking alcohol before bedtime as well. He was not interested in having any more tests, meds, or ICD implant.   Comes in today. Here alone. Doing ok. Not short of breath. Weight is stable. No chest pain. He feels pretty well and is happy with how he is doing. BP is lower at home. Taking his medicines. No alcohol.   Past Medical History  Diagnosis Date  . Hypertension   . Coronary artery disease     coronary artery bypass graft x 5 in 1996  . Diabetes mellitus   . Hypercholesterolemia     non-insulin dependent    Past Surgical History  Procedure Laterality Date  . Coronary artery bypass graft    . Lung surgery    . Other surgical history      percutaneous coronary intervention of the  posterolateral segment on 01/27/1998     Medications: Current Outpatient Prescriptions  Medication Sig Dispense Refill  .  aspirin 81 MG tablet Take 81 mg by mouth daily.    Marland Kitchen losartan (COZAAR) 50 MG tablet TAKE 1 TABLET BY MOUTH EVERY DAY 90 tablet 0  . metoprolol (LOPRESSOR) 50 MG tablet TAKE 1/2 TABLET BY MOUTH TWICE A DAY 30 tablet 3  . potassium chloride SA (K-DUR,KLOR-CON) 20 MEQ tablet Take 0.5 tablets (10 mEq total) by mouth daily.    . rosuvastatin (CRESTOR) 20 MG tablet Take 1 tablet (20 mg total) by mouth daily. 30 tablet 10  . vitamin C (ASCORBIC ACID) 500 MG tablet Take 500 mg by mouth daily.     No current facility-administered medications for this visit.    Allergies: No Known Allergies  Social History: The patient  reports that he has never smoked. He does not have any smokeless tobacco history on file. He reports that he does not drink alcohol or use illicit drugs.   Family History: The patient's family history is negative for CAD.   Review of Systems: Please see the history of present illness.    All other systems are reviewed and negative.   Physical Exam: VS:  BP 170/60 mmHg  Pulse 50  Ht 5\' 10"  (1.778 m)  Wt 177 lb 1.9 oz (  80.341 kg)  BMI 25.41 kg/m2  SpO2 96% .  BMI Body mass index is 25.41 kg/(m^2).  Wt Readings from Last 3 Encounters:  05/19/14 177 lb 1.9 oz (80.341 kg)  11/11/13 179 lb 12.8 oz (81.557 kg)  10/27/13 184 lb (83.462 kg)    General: Alert. He is in no acute distress. Weight is stable. HEENT: Normal. Neck: Supple, no JVD, carotid bruits, or masses noted.  Cardiac: Regular rate and rhythm. No murmurs, rubs, or gallops. No edema.  Respiratory:  Lungs are clear to auscultation bilaterally with normal work of breathing.  GI: Soft and nontender.  MS: No deformity or atrophy. Gait and ROM intact. Skin: Warm and dry. Color is normal.  Neuro:  Strength and sensation are intact and no gross focal deficits noted.  Psych: Alert, appropriate and with normal affect.   LABORATORY DATA:  EKG:  EKG is not ordered today.  Lab Results  Component Value Date   WBC  10.8* 10/22/2005   HGB 14.4 10/22/2005   HCT 42.8 10/22/2005   PLT 255 10/22/2005   GLUCOSE 109* 01/11/2014   CHOL 109 10/27/2013   TRIG 120.0 10/27/2013   HDL 50.30 10/27/2013   LDLCALC 35 10/27/2013   ALT 24 10/27/2013   AST 23 10/27/2013   NA 138 01/11/2014   K 4.1 01/11/2014   CL 105 01/11/2014   CREATININE 1.1 01/11/2014   BUN 17 01/11/2014   CO2 27 01/11/2014   TSH 2.48 10/22/2005   PSA 0.52 10/22/2005   HGBA1C 7.0* 10/22/2005    BNP (last 3 results) No results for input(s): BNP in the last 8760 hours.  ProBNP (last 3 results) No results for input(s): PROBNP in the last 8760 hours.   Other Studies Reviewed Today:  Echo Study Conclusions from October 2015  - Left ventricle: There is apical septal dyskinesis due to paradoxical septal motion from BBB. The cavity size was normal. There was moderate concentric hypertrophy. Systolic function was moderately reduced. The estimated ejection fraction was in the range of 35% to 40%. There is akinesis of the basalinferior myocardium. There is severe hypokinesis of the apicalinferior myocardium. There is hypokinesis of the entireinferolateral myocardium. - Ventricular septum: Septal motion showed paradox. - Aortic valve: There was trivial regurgitation. - Aorta: Ascending aortic diameter: 41 mm (S). - Ascending aorta: The ascending aorta was mildly dilated. - Mitral valve: There was moderate regurgitation. - Left atrium: The atrium was moderately dilated. - Tricuspid valve: There was mild regurgitation  Assessment / Plan: 1. Ischemic CM - chronic systolic HF - not interested in further testing. He is doing well clinically.  Volume status is ok.  2. Remote CABG - no symptoms  3. Hypokalemia - recheck lab today   4. HTN - recheck of his BP by me is down to 140/60 - he does not wish to change any medicines.   5. HLD - rechecking labs today.  Current medicines are reviewed with the patient today.  The  patient does not have concerns regarding medicines other than what has been noted above.  The following changes have been made:  See above.  Labs/ tests ordered today include:    Orders Placed This Encounter  Procedures  . Basic metabolic panel  . Hepatic function panel  . Lipid panel     Disposition:   FU with me with fasting labs in 6 months.   Patient is agreeable to this plan and will call if any problems develop in the interim.  Signed: Burtis Junes, RN, ANP-C 05/19/2014 11:14 AM  [facility] [address] [place], [place]  [address] Phone: [phone] Fax: [phone]

## 2014-05-19 NOTE — Patient Instructions (Addendum)
We will be checking the following labs today - BMET, Lipids and HPF   Medication Instructions:    Continue with your current medicines.     Testing/Procedures To Be Arranged:  N/A  Follow-Up:   See me in 6 months with fasting labs    Other Special Instructions:   N/A  Call the [place] office at [phone] if you have any questions, problems or concerns.

## 2014-06-24 ENCOUNTER — Other Ambulatory Visit: Payer: Self-pay | Admitting: Nurse Practitioner

## 2014-07-19 ENCOUNTER — Other Ambulatory Visit: Payer: Self-pay | Admitting: Cardiovascular Disease

## 2014-08-21 ENCOUNTER — Other Ambulatory Visit: Payer: Self-pay | Admitting: Cardiovascular Disease

## 2014-10-18 ENCOUNTER — Other Ambulatory Visit: Payer: Self-pay | Admitting: Cardiovascular Disease

## 2014-12-07 ENCOUNTER — Encounter: Payer: Self-pay | Admitting: Nurse Practitioner

## 2014-12-07 ENCOUNTER — Ambulatory Visit (INDEPENDENT_AMBULATORY_CARE_PROVIDER_SITE_OTHER): Payer: Commercial Managed Care - HMO | Admitting: Nurse Practitioner

## 2014-12-07 VITALS — BP 158/66 | HR 71 | Ht 70.0 in | Wt 173.8 lb

## 2014-12-07 DIAGNOSIS — I255 Ischemic cardiomyopathy: Secondary | ICD-10-CM | POA: Diagnosis not present

## 2014-12-07 NOTE — Patient Instructions (Addendum)
We will be checking the following labs today - NONE   Medication Instructions:    Continue with your current medicines.     Testing/Procedures To Be Arranged:  N/A  Follow-Up:   See me in 6 months with fasting labs    Other Special Instructions:   N/A    If you need a refill on your cardiac medications before your next appointment, please call your pharmacy.   Call the [facility] office at [phone] if you have any questions, problems or concerns.      

## 2014-12-07 NOTE — Progress Notes (Signed)
CARDIOLOGY OFFICE NOTE  Date:  12/07/2014    Philip Kerr Date of Birth: 15-Jul-1933 Medical Record [id number]  PCP:  Philip Cower, MD  Cardiologist:  Philip Kerr    Chief Complaint  Patient presents with  . Coronary Artery Disease    6 month check - seen for Philip Kerr    History of Present Illness: Philip Kerr is a 81 y.o. male who presents today for a follow up visit. Seen for Philip Kerr. Former patient of Dr. Maren Kerr. He has known CAD with remote CABG x 5 back in 1996, HTN, HLD, and DM.   Echo from 2014 showed EF down to 35% with mild AI, moderate MR. Stress Myoview from May of 2014 was without ischemia, but with lateral scar. Patient refused ARB therapy at that time and was not interested in cardiac cath.   I saw him back in October of 2015 for the first time - hard visit to figure out what was going on. I updated his echo. Got a Holter - lots of PVCs - Philip Kerr increased his beta blocker.  This made his HR go to the 40's - so the patient went back to his old dose.  Last seen by me in May of 2016 -   was doing ok and feeling well at his last visit. Had stopped drinking alcohol before bedtime as well. He was not interested in having any more tests, meds, or ICD implant.   Comes in today. Here alone. Says he has been doing ok. Says "you really helped me" No more palpitations. No chest pain. Breathing is ok. Weight is down a few pounds. Not fasting today. He is happy with how he is doing.  Past Medical History  Diagnosis Date  . Hypertension   . Coronary artery disease     coronary artery bypass graft x 5 in 1996  . Diabetes mellitus   . Hypercholesterolemia     non-insulin dependent    Past Surgical History  Procedure Laterality Date  . Coronary artery bypass graft    . Lung surgery    . Other surgical history      percutaneous coronary intervention of the  posterolateral segment on 01/27/1998     Medications: Current Outpatient Prescriptions    Medication Sig Dispense Refill  . aspirin 81 MG tablet Take 81 mg by mouth daily.    . CRESTOR 20 MG tablet TAKE 1 TABLET (20 MG TOTAL) BY MOUTH DAILY. 30 tablet 3  . losartan (COZAAR) 50 MG tablet TAKE 1 TABLET BY MOUTH EVERY DAY 90 tablet 1  . metoprolol (LOPRESSOR) 50 MG tablet TAKE 1/2 TABLET BY MOUTH TWICE A DAY 30 tablet 3  . potassium chloride SA (KLOR-CON M20) 20 MEQ tablet Take 0.5 tablets (10 mEq total) by mouth daily. 30 tablet 6  . rosuvastatin (CRESTOR) 20 MG tablet Take 1 tablet (20 mg total) by mouth daily. 30 tablet 10  . vitamin C (ASCORBIC ACID) 500 MG tablet Take 500 mg by mouth daily.     No current facility-administered medications for this visit.    Allergies: No Known Allergies  Social History: The patient  reports that he has never smoked. He does not have any smokeless tobacco history on file. He reports that he does not drink alcohol or use illicit drugs.   Family History: The patient's family history is negative for CAD.   Review of Systems: Please see the history of present illness.   Otherwise, the  review of systems is positive for none.   All other systems are reviewed and negative.   Physical Exam: VS:  BP 158/66 mmHg  Pulse 71  Ht 5\' 10"  (1.778 m)  Wt 173 lb 12.8 oz (78.835 kg)  BMI 24.94 kg/m2 .  BMI Body mass index is 24.94 kg/(m^2).  Wt Readings from Last 3 Encounters:  12/07/14 173 lb 12.8 oz (78.835 kg)  05/19/14 177 lb 1.9 oz (80.341 kg)  11/11/13 179 lb 12.8 oz (81.557 kg)   Recheck of his BP by me is 130/60.   General: Alert. A little gruff but in no acute distress.  HEENT: Normal. Neck: Supple, no JVD, carotid bruits, or masses noted.  Cardiac: Regular rate and rhythm. No murmurs, rubs, or gallops. No edema.  Respiratory:  Lungs are clear to auscultation bilaterally with normal work of breathing.  GI: Soft and nontender.  MS: No deformity or atrophy. Gait and ROM intact. Skin: Warm and dry. Color is normal.  Neuro:  Strength and  sensation are intact and no gross focal deficits noted.  Psych: Alert, appropriate and with normal affect.   LABORATORY DATA:  EKG:  EKG is ordered today. This shows NSR with LBBB.  Lab Results  Component Value Date   WBC 10.8* 10/22/2005   HGB 14.4 10/22/2005   HCT 42.8 10/22/2005   PLT 255 10/22/2005   GLUCOSE 102* 05/19/2014   CHOL 117 05/19/2014   TRIG 195.0* 05/19/2014   HDL 39.30 05/19/2014   LDLCALC 39 05/19/2014   ALT 11 05/19/2014   AST 12 05/19/2014   NA 138 05/19/2014   K 4.9 05/19/2014   CL 107 05/19/2014   CREATININE 0.98 05/19/2014   BUN 12 05/19/2014   CO2 28 05/19/2014   TSH 2.48 10/22/2005   PSA 0.52 10/22/2005   HGBA1C 7.0* 10/22/2005    BNP (last 3 results) No results for input(s): BNP in the last 8760 hours.  ProBNP (last 3 results) No results for input(s): PROBNP in the last 8760 hours.   Other Studies Reviewed Today:  Echo Study Conclusions from October 2015  - Left ventricle: There is apical septal dyskinesis due to paradoxical septal motion from BBB. The cavity size was normal. There was moderate concentric hypertrophy. Systolic function was moderately reduced. The estimated ejection fraction was in the range of 35% to 40%. There is akinesis of the basalinferior myocardium. There is severe hypokinesis of the apicalinferior myocardium. There is hypokinesis of the entireinferolateral myocardium. - Ventricular septum: Septal motion showed paradox. - Aortic valve: There was trivial regurgitation. - Aorta: Ascending aortic diameter: 41 mm (S). - Ascending aorta: The ascending aorta was mildly dilated. - Mitral valve: There was moderate regurgitation. - Left atrium: The atrium was moderately dilated. - Tricuspid valve: There was mild regurgitation  Assessment / Plan: 1. Ischemic CM - chronic systolic HF - not interested in further testing. He is doing well clinically. Volume status is ok.  2. Remote CABG - no  symptoms  3. HTN - recheck of his BP by me is down - he does not wish to change any medicines and does not need med change today.   5. HLD - remains on statin therapy  Current medicines are reviewed with the patient today.  The patient does not have concerns regarding medicines other than what has been noted above.  The following changes have been made:  See above.  Labs/ tests ordered today include:    Orders Placed This Encounter  Procedures  .  EKG 12-Lead     Disposition:   FU with me in 6 months.   Patient is agreeable to this plan and will call if any problems develop in the interim.   Signed: Burtis Junes, RN, ANP-C 12/07/2014 11:26 AM  [facility] [address] [place], [place]  [address] Phone: [phone] Fax: [phone]

## 2015-01-02 ENCOUNTER — Other Ambulatory Visit: Payer: Self-pay | Admitting: Nurse Practitioner

## 2015-04-22 ENCOUNTER — Other Ambulatory Visit: Payer: Self-pay | Admitting: Cardiovascular Disease

## 2015-05-31 ENCOUNTER — Ambulatory Visit (INDEPENDENT_AMBULATORY_CARE_PROVIDER_SITE_OTHER): Payer: Commercial Managed Care - HMO | Admitting: Nurse Practitioner

## 2015-05-31 ENCOUNTER — Encounter: Payer: Self-pay | Admitting: Nurse Practitioner

## 2015-05-31 VITALS — BP 138/80 | HR 56 | Ht 70.0 in | Wt 172.4 lb

## 2015-05-31 DIAGNOSIS — I255 Ischemic cardiomyopathy: Secondary | ICD-10-CM | POA: Diagnosis not present

## 2015-05-31 DIAGNOSIS — I10 Essential (primary) hypertension: Secondary | ICD-10-CM

## 2015-05-31 DIAGNOSIS — E7800 Pure hypercholesterolemia, unspecified: Secondary | ICD-10-CM | POA: Diagnosis not present

## 2015-05-31 DIAGNOSIS — I2589 Other forms of chronic ischemic heart disease: Secondary | ICD-10-CM | POA: Diagnosis not present

## 2015-05-31 LAB — LIPID PANEL
Cholesterol: 102 mg/dL — ABNORMAL LOW (ref 125–200)
HDL: 41 mg/dL (ref >=40)
LDL Cholesterol: 37 mg/dL (ref <130)
Total CHOL/HDL Ratio: 2.5 Ratio (ref <=5.0)
Triglycerides: 119 mg/dL (ref <150)
VLDL: 24 mg/dL (ref <30)

## 2015-05-31 LAB — HEPATIC FUNCTION PANEL
ALT: 12 U/L (ref 9–46)
AST: 14 U/L (ref 10–35)
Albumin: 3.5 g/dL — ABNORMAL LOW (ref 3.6–5.1)
Alkaline Phosphatase: 56 U/L (ref 40–115)
Bilirubin, Direct: 0.1 mg/dL (ref <=0.2)
Indirect Bilirubin: 0.2 mg/dL (ref 0.2–1.2)
Total Bilirubin: 0.3 mg/dL (ref 0.2–1.2)
Total Protein: 6.4 g/dL (ref 6.1–8.1)

## 2015-05-31 LAB — BASIC METABOLIC PANEL
BUN: 15 mg/dL (ref 7–25)
CO2: 25 mmol/L (ref 20–31)
Calcium: 8.5 mg/dL — ABNORMAL LOW (ref 8.6–10.3)
Chloride: 104 mmol/L (ref 98–110)
Creat: 0.97 mg/dL (ref 0.70–1.18)
Glucose, Bld: 111 mg/dL — ABNORMAL HIGH (ref 65–99)
Potassium: 5 mmol/L (ref 3.5–5.3)
Sodium: 134 mmol/L — ABNORMAL LOW (ref 135–146)

## 2015-05-31 NOTE — Progress Notes (Signed)
CARDIOLOGY OFFICE NOTE  Date:  05/31/2015    Philip Kerr Date of Birth: [birth year] Medical Record [id number]  PCP:  Philip Cower, MD  Cardiologist:  Philip Kerr    Chief Complaint  Patient presents with  . Cardiomyopathy  . Hyperlipidemia  . Hypertension    6 month check - seen for Philip Kerr    History of Present Illness: Philip Kerr is a 82 y.o. male who presents today for a follow up visit. Seen for Philip Kerr. Former patient of Philip Kerr.   He has known CAD with remote CABG x 5 back in 1996, HTN, HLD, and DM.   Echo from 2014 showed EF down to 35% with mild AI, moderate MR. Stress Myoview from May of 2014 was without ischemia, but with lateral scar. Patient refused ARB therapy at that time and was not interested in cardiac cath.   I saw him back in October of 2015 for the first time - hard visit to figure out what was going on. I updated his echo. Got a Holter - lots of PVCs - Philip Kerr increased his beta blocker. This made his HR go to the 40's - so the patient went back to his old dose. Last seen by me in May of 2016 - was doing ok and feeling well at his last visit. Had stopped drinking alcohol before bedtime as well. He was not interested in having any more tests, meds, or ICD implant. I last saw him back in November and he was doing ok.   Comes in today. Here alone. Says he is doing ok - "still here". No chest pain. Breathing ok. Taking his medicines. He is ok with how he is doing overall and has no complaint.    Past Medical History  Diagnosis Date  . Hypertension   . Coronary artery disease     coronary artery bypass graft x 5 in 1996  . Diabetes mellitus   . Hypercholesterolemia     non-insulin dependent    Past Surgical History  Procedure Laterality Date  . Coronary artery bypass graft    . Lung surgery    . Other surgical history      percutaneous coronary intervention of the  posterolateral segment on 01/27/1998      Medications: Current Outpatient Prescriptions  Medication Sig Dispense Refill  . aspirin 81 MG tablet Take 81 mg by mouth daily.    Marland Kitchen losartan (COZAAR) 50 MG tablet TAKE 1 TABLET BY MOUTH EVERY DAY 90 tablet 1  . metoprolol (LOPRESSOR) 50 MG tablet TAKE 1/2 TABLET BY MOUTH TWICE A DAY 30 tablet 11  . potassium chloride SA (KLOR-CON M20) 20 MEQ tablet Take 0.5 tablets (10 mEq total) by mouth daily. 30 tablet 6  . rosuvastatin (CRESTOR) 20 MG tablet Take 1 tablet (20 mg total) by mouth daily. 30 tablet 10  . vitamin C (ASCORBIC ACID) 500 MG tablet Take 500 mg by mouth daily.     No current facility-administered medications for this visit.    Allergies: No Known Allergies  Social History: The patient  reports that he has never smoked. He does not have any smokeless tobacco history on file. He reports that he does not drink alcohol or use illicit drugs.   Family History: The patient's family history is negative for CAD.   Review of Systems: Please see the history of present illness.   Otherwise, the review of systems is positive for none.   All other  systems are reviewed and negative.   Physical Exam: VS:  BP 138/80 mmHg  Pulse 56  Ht 5\' 10"  (1.778 m)  Wt 172 lb 6.4 oz (78.2 kg)  BMI 24.74 kg/m2 .  BMI Body mass index is 24.74 kg/(m^2).  Wt Readings from Last 3 Encounters:  05/31/15 172 lb 6.4 oz (78.2 kg)  12/07/14 173 lb 12.8 oz (78.835 kg)  05/19/14 177 lb 1.9 oz (80.341 kg)    General: Pleasant. Well developed, well nourished and in no acute distress.  HEENT: Normal. Neck: Supple, no JVD, carotid bruits, or masses noted.  Cardiac: Regular rate and rhythm. No murmurs, rubs, or gallops. No edema.  Respiratory:  Lungs are clear to auscultation bilaterally with normal work of breathing.  GI: Soft and nontender.  MS: No deformity or atrophy. Gait and ROM intact. Skin: Warm and dry. Color is normal.  Neuro:  Strength and sensation are intact and no gross focal deficits  noted.  Psych: Alert, appropriate and with normal affect.   LABORATORY DATA:  EKG:  EKG is not ordered today.  Lab Results  Component Value Date   WBC 10.8* 10/22/2005   HGB 14.4 10/22/2005   HCT 42.8 10/22/2005   PLT 255 10/22/2005   GLUCOSE 102* 05/19/2014   CHOL 117 05/19/2014   TRIG 195.0* 05/19/2014   HDL 39.30 05/19/2014   LDLCALC 39 05/19/2014   ALT 11 05/19/2014   AST 12 05/19/2014   NA 138 05/19/2014   K 4.9 05/19/2014   CL 107 05/19/2014   CREATININE 0.98 05/19/2014   BUN 12 05/19/2014   CO2 28 05/19/2014   TSH 2.48 10/22/2005   PSA 0.52 10/22/2005   HGBA1C 7.0* 10/22/2005    BNP (last 3 results) No results for input(s): BNP in the last 8760 hours.  ProBNP (last 3 results) No results for input(s): PROBNP in the last 8760 hours.   Other Studies Reviewed Today:  Echo Study Conclusions from October 2015  - Left ventricle: There is apical septal dyskinesis due to paradoxical septal motion from BBB. The cavity size was normal. There was moderate concentric hypertrophy. Systolic function was moderately reduced. The estimated ejection fraction was in the range of 35% to 40%. There is akinesis of the basalinferior myocardium. There is severe hypokinesis of the apicalinferior myocardium. There is hypokinesis of the entireinferolateral myocardium. - Ventricular septum: Septal motion showed paradox. - Aortic valve: There was trivial regurgitation. - Aorta: Ascending aortic diameter: 41 mm (S). - Ascending aorta: The ascending aorta was mildly dilated. - Mitral valve: There was moderate regurgitation. - Left atrium: The atrium was moderately dilated. - Tricuspid valve: There was mild regurgitation  Assessment / Plan: 1. Ischemic CM - chronic systolic HF - not interested in further testing. He is doing well clinically. Volume status is ok. Does not wish to come back for one year.   2. Remote CABG - no symptoms  3. HTN - BP is stable on his  current regimen. No changes made.  5. HLD - remains on statin therapy - labs today.  Current medicines are reviewed with the patient today.  The patient does not have concerns regarding medicines other than what has been noted above.  The following changes have been made:  See above.  Labs/ tests ordered today include:    Orders Placed This Encounter  Procedures  . Basic metabolic panel  . Hepatic function panel  . Lipid panel     Disposition:   FU with Philip Kerr in  12 months.   Patient is agreeable to this plan and will call if any problems develop in the interim.   Signed: Burtis Junes, RN, ANP-C 05/31/2015 11:06 AM  [facility] [address] [place], [place]  [address] Phone: [phone] Fax: [phone]

## 2015-05-31 NOTE — Patient Instructions (Addendum)
We will be checking the following labs today - BMET, HPF, Lipids   Medication Instructions:    Continue with your current medicines.     Testing/Procedures To Be Arranged:  N/A  Follow-Up:   Recall visit for one year     Other Special Instructions:   N/A    If you need a refill on your cardiac medications before your next appointment, please call your pharmacy.   Call the [place] office at [phone] if you have any questions, problems or concerns.

## 2015-06-08 ENCOUNTER — Telehealth: Payer: Self-pay | Admitting: Cardiovascular Disease

## 2015-06-08 NOTE — Telephone Encounter (Signed)
Spoke with pt and reviewed lab results with him. 

## 2015-06-08 NOTE — Telephone Encounter (Signed)
Follow Up ° °Pt returned call//  °

## 2015-08-25 ENCOUNTER — Other Ambulatory Visit: Payer: Self-pay | Admitting: Cardiovascular Disease

## 2015-10-27 ENCOUNTER — Other Ambulatory Visit: Payer: Self-pay | Admitting: Nurse Practitioner

## 2015-11-09 ENCOUNTER — Other Ambulatory Visit: Payer: Self-pay | Admitting: Cardiovascular Disease

## 2016-03-29 ENCOUNTER — Telehealth: Payer: Self-pay | Admitting: Cardiovascular Disease

## 2016-03-29 NOTE — Telephone Encounter (Signed)
New message   Pt wife is calling.  Pt c/o swelling: STAT is pt has developed SOB within 24 hours  1. How long have you been experiencing swelling? A week  2. Where is the swelling located? Feet and legs  3.  Are you currently taking a "fluid pill"? No  4.  Are you currently SOB? Per pt wife, pt coughs a lot.  5.  Have you traveled recently? No   Pt wife scheduled appt per recall and is asking if pt should come in sooner for the swelling.

## 2016-03-29 NOTE — Telephone Encounter (Signed)
I spoke with pt's wife. She reports she noticed swelling in pt's feet and ankles about a week ago. She does not know when swelling started but thinks it may have been longer than a week ago.  She states pt does not complain and dislikes going to see the doctor.  Does not weigh daily but wife states pt has lost weight recently. She does not think he has been having chest pain. He does have shortness of breath and cough at night.  I scheduled pt to see Dr. Angelena Form on March 26,2018 at 10:45.  I instructed wife pt should go to ED if symptoms worsen prior to this appointment.

## 2016-04-02 ENCOUNTER — Encounter: Payer: Self-pay | Admitting: Cardiovascular Disease

## 2016-04-02 ENCOUNTER — Ambulatory Visit (INDEPENDENT_AMBULATORY_CARE_PROVIDER_SITE_OTHER): Payer: Medicare HMO | Admitting: Cardiovascular Disease

## 2016-04-02 ENCOUNTER — Encounter (INDEPENDENT_AMBULATORY_CARE_PROVIDER_SITE_OTHER): Payer: Self-pay

## 2016-04-02 VITALS — BP 154/64 | HR 62 | Ht 70.0 in | Wt 180.6 lb

## 2016-04-02 DIAGNOSIS — E7800 Pure hypercholesterolemia, unspecified: Secondary | ICD-10-CM

## 2016-04-02 DIAGNOSIS — I255 Ischemic cardiomyopathy: Secondary | ICD-10-CM | POA: Diagnosis not present

## 2016-04-02 DIAGNOSIS — I447 Left bundle-branch block, unspecified: Secondary | ICD-10-CM

## 2016-04-02 DIAGNOSIS — I2510 Atherosclerotic heart disease of native coronary artery without angina pectoris: Secondary | ICD-10-CM | POA: Diagnosis not present

## 2016-04-02 DIAGNOSIS — I10 Essential (primary) hypertension: Secondary | ICD-10-CM | POA: Diagnosis not present

## 2016-04-02 MED ORDER — FUROSEMIDE 20 MG PO TABS: 20.0000 mg | ORAL_TABLET | Freq: Every day | ORAL | 11 refills | Status: DC | PRN

## 2016-04-02 NOTE — Patient Instructions (Signed)
Medication Instructions:  Your physician has recommended you make the following change in your medication:  Start furosemide 20 mg by mouth daily as needed for swelling   Labwork: none  Testing/Procedures: None  Follow-Up: Your physician recommends that you schedule a follow-up appointment in: 12 months. Please call our office in about 9 months to schedule this appointment    Any Other Special Instructions Will Be Listed Below (If Applicable).     If you need a refill on your cardiac medications before your next appointment, please call your pharmacy.

## 2016-04-02 NOTE — Progress Notes (Signed)
Chief Complaint  Patient presents with  . Cardiomyopathy    History of Present Illness: 83 yo male with history of CAD s/p 5V CABG 1996, HTN, HLD, DM here today for cardiac follow up. He has been followed in the past by Dr. Lia Foyer. Echo October 2015 with LVEF=35=40%, moderate LVH, trivial AI, moderate MR. Stress myoview May 2014 without ischemia, lateral scar. Cardiac monitor 2015 with PVCs. He did not tolerate higher doses of beta blockers. He has been reluctant to consider cardiac testing and has refused to consider an ICD.   He is here today for follow up. Feeling well overall. No chest pain, SOB or dizziness. He has had lower extremity edema, bilateral but slightly worse on the right. This is improving.   Primary Care Physician: No PCP Per Patient He has seen Dr. Jenny Reichmann in the past  Past Medical History:  Diagnosis Date  . Coronary artery disease    coronary artery bypass graft x 5 in 1996  . Diabetes mellitus   . Hypercholesterolemia    non-insulin dependent  . Hypertension     Past Surgical History:  Procedure Laterality Date  . CORONARY ARTERY BYPASS GRAFT    . LUNG SURGERY    . OTHER SURGICAL HISTORY     percutaneous coronary intervention of the  posterolateral segment on 01/27/1998    Current Outpatient Prescriptions  Medication Sig Dispense Refill  . aspirin 81 MG tablet Take 81 mg by mouth daily.    Marland Kitchen losartan (COZAAR) 50 MG tablet TAKE 1 TABLET BY MOUTH EVERY DAY 90 tablet 1  . metoprolol (LOPRESSOR) 50 MG tablet TAKE 1/2 TABLET BY MOUTH TWICE A DAY 30 tablet 11  . potassium chloride SA (KLOR-CON M20) 20 MEQ tablet Take 0.5 tablets (10 mEq total) by mouth daily. 45 tablet 2  . rosuvastatin (CRESTOR) 20 MG tablet Take 1 tablet (20 mg total) by mouth daily. 30 tablet 10  . vitamin C (ASCORBIC ACID) 500 MG tablet Take 500 mg by mouth daily.    . furosemide (LASIX) 20 MG tablet Take 1 tablet (20 mg total) by mouth daily as needed (for swelling). 20 tablet 11   No  current facility-administered medications for this visit.     No Known Allergies  Social History   Social History  . Marital status: Married    Spouse name: N/A  . Number of children: 1  . Years of education: N/A   Occupational History  . 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 Smoking status: Never Smoker  . Smokeless tobacco: Never Used  . Alcohol use No  . Drug use: No  . Sexual activity: Not Currently   Other Topics Concern  . Not on file   Social History Narrative  . No narrative on file    Family History  Problem Relation Age of Onset  . CAD Neg Hx     Review of Systems:  As stated in the HPI and otherwise negative.   BP (!) 154/64   Pulse 62   Ht 5\' 10"  (1.778 m)   Wt 180 lb 9.6 oz (81.9 kg)   BMI 25.91 kg/m   Physical Examination: General: Well developed, well nourished, NAD  HEENT: OP clear, mucus membranes moist  SKIN: warm, dry. No rashes. Neuro: No focal deficits  Musculoskeletal: Muscle strength 5/5 all ext  Psychiatric: Mood and affect normal  Neck: No JVD, no carotid bruits, no thyromegaly, no lymphadenopathy.  Lungs:Clear bilaterally,  no wheezes, rhonci, crackles Cardiovascular: Regular rate and rhythm. No murmurs, gallops or rubs. Abdomen:Soft. Bowel sounds present. Non-tender.  Extremities: No lower extremity edema. Pulses are 2 + in the bilateral DP/PT.  Echo 04/24/12: Left ventricle: The cavity size was mildly dilated. Wall thickness was normal. The estimated ejection fraction was 35%. Posterior akinesis and basal inferior hypokinesis. Features are consistent with a pseudonormal left ventricular filling pattern, with concomitant abnormal relaxation and increased filling pressure (grade 2 diastolic dysfunction). - Aortic valve: There was no stenosis. Mild regurgitation. - Mitral valve: Moderate regurgitation. There is restriction of the posterior leaflet likely related to the inferoposterior wall motion  abnormality. Suspect infarct-related MR. Effective regurgitant orifice: 0.24cm^2 (PISA). - Left atrium: The atrium was moderately dilated. - Right ventricle: The cavity size was normal. Systolic function was normal. - Tricuspid valve: Peak RV-RA gradient: 85mm Hg (S). - Pulmonary arteries: PA systolic pressure 67-67 mmHg. - Systemic veins: IVC measured 2.0 cmwith normal respirophasic variation, suggesting RA pressure 6-10 mmHg. Impressions:  - Mildly dilated LV with moderately decreased systolic function, EF 20%. Posterior akinesis, basal inferior severe hypokinesis. Moderate diastolic dysfunction. Moderate MR (probably infarct-related MR). Mild pulmonary hypertension. Normal RV size and systolic function.  EKG:  EKG is ordered today. The ekg ordered today demonstrates NSR, rate 62 bpm. LBBB  Recent Labs: 05/31/2015: ALT 12; BUN 15; Creat 0.97; Potassium 5.0; Sodium 134   Lipid Panel    Component Value Date/Time   CHOL 102 (L) 05/31/2015 1113   TRIG 119 05/31/2015 1113   TRIG 102 10/22/2005 0730   HDL 41 05/31/2015 1113   CHOLHDL 2.5 05/31/2015 1113   VLDL 24 05/31/2015 1113   LDLCALC 37 05/31/2015 1113     Wt Readings from Last 3 Encounters:  04/02/16 180 lb 9.6 oz (81.9 kg)  05/31/15 172 lb 6.4 oz (78.2 kg)  12/07/14 173 lb 12.8 oz (78.8 kg)     Other studies Reviewed: Additional studies/ records that were reviewed today include: . Review of the above records demonstrates:   Assessment and Plan:   1. CAD without angina: He has no chest pain suggestive of angina. No ischemia noted on stress test 2014. LVEF did drop but patient and Dr. Lia Foyer discussed and he refused cath. Continue ASA, statin, beta blocker  2. Cardiomyopathy, ischemic: LVEF=35-40% by echo 2015. Continue beta blocker and ARB.   3. Hyperlipidemia: LDL at goal. Continue statin.     4. LBBB, chronic   5. HTN: BP is controlled at home. NO changes  6. Chronic systolic CHF: Slight LE edema. Will  give him Lasix to use prn.   Current medicines are reviewed at length with the patient today.  The patient does not have concerns regarding medicines.  The following changes have been made:  no change  Labs/ tests ordered today include:   Orders Placed This Encounter  Procedures  . EKG 12-Lead     Disposition:   FU with me in 12 months   Signed, Lauree Chandler, MD 04/02/2016 1:34 PM    [facility] [place], [place], [place]  [address] Phone: [phone]; Fax: [phone]

## 2016-05-15 ENCOUNTER — Other Ambulatory Visit: Payer: Self-pay | Admitting: Cardiovascular Disease

## 2016-05-22 ENCOUNTER — Other Ambulatory Visit: Payer: Self-pay | Admitting: Cardiovascular Disease

## 2016-05-30 ENCOUNTER — Ambulatory Visit: Payer: Commercial Managed Care - HMO | Admitting: Cardiovascular Disease

## 2016-10-31 ENCOUNTER — Other Ambulatory Visit: Payer: Self-pay | Admitting: *Deleted

## 2016-10-31 NOTE — Patient Outreach (Signed)
[employer] outreach for HRA and possible AWV. I was not able to reach this member today and left a message on his home and mobile numbers to return my call.  I note pt states he currently does not have a [facility] Provider but does go to [facility] and sees Dr. Angelena Form.  There is no evidence of him having an advanced directive of any type.  I will call him back within the week if he does not return my call.  Eulah Pont. [name], MSN, [facility] Gerontological Nurse Practitioner [facility] Care Management [phone]

## 2016-11-06 ENCOUNTER — Other Ambulatory Visit: Payer: Self-pay | Admitting: *Deleted

## 2016-11-06 NOTE — Patient Outreach (Signed)
Second attempt to complete [employer] Risk Assessment. No one answered the phone but I left a message and requested a return call.  I will call him back if I do not hear from him.  Philip Kerr. [name], MSN, [facility] Gerontological Nurse Practitioner [facility] Care Management [phone]

## 2016-11-12 ENCOUNTER — Encounter: Payer: Self-pay | Admitting: *Deleted

## 2016-11-12 ENCOUNTER — Telehealth: Payer: Self-pay | Admitting: *Deleted

## 2016-11-12 NOTE — Telephone Encounter (Signed)
[place] for shirron to assist with contacting pt for AWV with Sharee Pimple, as well as yearly follow up appt with me, thanks

## 2016-11-12 NOTE — Patient Outreach (Signed)
[name] telephone screen completed today. Pt does not see his primary care provider, it has been a long time. He does see his cardiologist however. He has a long hx of CAD and has had multiple procedures including CAGB. He reports he takes his medications but could not tell me what they are.  He has not had an AWV with his primary care provider and I offered to provide this to him at his home or buisness. He states he would rather go to Dr. Jenny Reichmann for this. I have encouraged him to do so before the end of the year.  He has had a flu vaccine given at [facility]. He has never had a pneumovaccination.  He does not require any case management services at this time. I will send him our information for future reference.  Philip Kerr. [name], MSN, [facility] Gerontological Nurse Practitioner [facility] Care Management [phone]

## 2016-11-13 NOTE — Telephone Encounter (Signed)
Appointment scheduled with Sharee Pimple and Dr Jenny Reichmann

## 2016-11-18 ENCOUNTER — Other Ambulatory Visit: Payer: Self-pay | Admitting: Nurse Practitioner

## 2016-11-26 NOTE — Progress Notes (Signed)
Subjective:   Philip Kerr is a 83 y.o. male who presents for an Initial Medicare Annual Wellness Visit.  Review of Systems  No ROS.  Medicare Wellness Visit. Additional risk factors are reflected in the social history.  Cardiac Risk Factors include: advanced age (>38men, >48 women);diabetes mellitus;dyslipidemia;hypertension;male gender Sleep patterns: gets up 1-2 times nightly to void and sleeps 6 hours nightly.  Patient reports insomnia issues, discussed recommended sleep tips.   Home Safety/Smoke Alarms: Feels safe in home. Smoke alarms in place.  Living environment; residence and Firearm Safety: 1-story house/ trailer, no firearms. Lives with wife, no needs for DME, good support system Seat Belt Safety/Bike Helmet: Wears seat belt.    Objective:    Today's Vitals   11/27/16 1013  BP: (!) 153/58  Pulse: (!) 57  Resp: 20  Temp: (!) 97.4 F (36.3 C)  SpO2: 98%  Weight: 171 lb (77.6 kg)  Height: 5\' 10"  (1.778 m)   Body mass index is 24.54 kg/m.  Current Medications (verified) Outpatient Encounter Medications as of 11/27/2016  Medication Sig  . aspirin 81 MG tablet Take 81 mg by mouth daily.  Marland Kitchen KLOR-CON M20 20 MEQ tablet TAKE 1/2 TABLET BY MOUTH EVERY DAY  . losartan (COZAAR) 50 MG tablet TAKE 1 TABLET BY MOUTH EVERY DAY  . metoprolol tartrate (LOPRESSOR) 50 MG tablet TAKE 1/2 TABLET BY MOUTH TWICE A DAY  . rosuvastatin (CRESTOR) 20 MG tablet Take 1 tablet (20 mg total) by mouth daily.  . rosuvastatin (CRESTOR) 20 MG tablet TAKE 1 TABLET (20 MG TOTAL) BY MOUTH DAILY.  . vitamin C (ASCORBIC ACID) 500 MG tablet Take 500 mg by mouth daily.  . furosemide (LASIX) 20 MG tablet Take 1 tablet (20 mg total) by mouth daily as needed (for swelling).   No facility-administered encounter medications on file as of 11/27/2016.     Allergies (verified) Patient has no known allergies.   History: Past Medical History:  Diagnosis Date  . Coronary artery disease    coronary  artery bypass graft x 5 in 1996  . Diabetes mellitus   . Hypercholesterolemia    non-insulin dependent  . Hypertension    Past Surgical History:  Procedure Laterality Date  . CORONARY ARTERY BYPASS GRAFT    . LUNG SURGERY    . OTHER SURGICAL HISTORY     percutaneous coronary intervention of the  posterolateral segment on 01/27/1998   Family History  Problem Relation Age of Onset  . CAD Neg Hx    Social History   Occupational History  . Occupation: Runs a Acupuncturist: SELF-EMPLOYED  Tobacco Use  . Smoking status: Never Smoker  . Smokeless tobacco: Never Used  Substance and Sexual Activity  . Alcohol use: No  . Drug use: No  . Sexual activity: Not Currently   Tobacco Counseling Counseling given: Not Answered   Activities of Daily Living In your present state of health, do you have any difficulty performing the following activities: 11/27/2016  Hearing? N  Vision? N  Difficulty concentrating or making decisions? N  Walking or climbing stairs? N  Dressing or bathing? N  Doing errands, shopping? N  Preparing Food and eating ? N  Using the Toilet? N  In the past six months, have you accidently leaked urine? N  Do you have problems with loss of bowel control? N  Managing your Medications? N  Managing your Finances? N  Housekeeping or managing your Housekeeping? N  Some  recent data might be hidden    Immunizations and Health Maintenance Immunization History  Administered Date(s) Administered  . Influenza-Unspecified 10/08/2016   Health Maintenance Due  Topic Date Due  . OPHTHALMOLOGY EXAM  09/27/1945  . TETANUS/TDAP  09/28/1954  . HEMOGLOBIN A1C  04/23/2006    Patient Care Team: Patient, No Pcp Per as PCP - General ([facility] [place]) Burnell Blanks, MD as Consulting Physician (Cardiology)  Indicate any recent Medical Services you may have received from other than [facility] providers in the past year (date may be approximate).      Assessment:   This is a routine wellness examination for Alaa.Physical assessment deferred to PCP.   Hearing/Vision screen  Visual Acuity Screening   Right eye Left eye Both eyes  Without correction: 20/40 20/40 20/40   With correction:     Hearing Screening Comments: Able to hear conversational tones w/o difficulty. No issues reported.  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 Discussed supplementing with ensure to increase nutrition.  Goals    . Patient Stated     Stay as active and as independent as possible      Depression Screen PHQ 2/9 Scores 11/27/2016 11/12/2016  PHQ - 2 Score 0 0  PHQ- 9 Score 2 -    Fall Risk Fall Risk  11/27/2016  Falls in the past year? No    Cognitive Function: MMSE - Mini Mental State Exam 11/27/2016  Orientation to time 5  Orientation to Place 5  Registration 3  Attention/ Calculation 5  Recall 0  Language- name 2 objects 2  Language- repeat 1  Language- follow 3 step command 3  Language- read & follow direction 1  Write a sentence 1  Copy design 1  Total score 27        Screening Tests Health Maintenance  Topic Date Due  . OPHTHALMOLOGY EXAM  09/27/1945  . TETANUS/TDAP  09/28/1954  . HEMOGLOBIN A1C  04/23/2006  . PNA vac Low Risk Adult (1 of 2 - PCV13) 11/27/2017 (Originally 09/27/2000)  . FOOT EXAM  11/27/2017  . INFLUENZA VACCINE  Completed        Plan:    Continue  doing brain stimulating activities (puzzles, reading, adult coloring books, staying active) to keep memory sharp.   Continue to eat heart healthy diet (full of fruits, vegetables, whole grains, lean protein, water--limit salt, fat, and sugar intake) and increase physical activity as tolerated.  I have personally reviewed and noted the following in the patient's chart:   . Medical and social history . Use of alcohol, tobacco or illicit drugs  . Current medications and supplements . Functional ability and status . Nutritional status . Physical activity . Advanced directives . List of other physicians . Vitals . Screenings to include cognitive, depression, and falls . Referrals and appointments  In addition, I have reviewed and discussed with patient certain preventive protocols, quality metrics, and best practice recommendations. A written personalized care plan for preventive services as well as general preventive health recommendations were  provided to patient.     Michiel Cowboy, RN   11/27/2016   Medical screening examination/treatment/procedure(s) were performed by non-physician practitioner and as supervising physician I was immediately available for consultation/collaboration. I agree with above. Cathlean Cower, MD

## 2016-11-27 ENCOUNTER — Encounter: Payer: Self-pay | Admitting: Internal Medicine

## 2016-11-27 ENCOUNTER — Other Ambulatory Visit (INDEPENDENT_AMBULATORY_CARE_PROVIDER_SITE_OTHER): Payer: Medicare HMO

## 2016-11-27 ENCOUNTER — Ambulatory Visit (INDEPENDENT_AMBULATORY_CARE_PROVIDER_SITE_OTHER): Payer: Medicare HMO | Admitting: Internal Medicine

## 2016-11-27 VITALS — BP 153/58 | HR 57 | Temp 97.4°F | Resp 20 | Ht 70.0 in | Wt 171.0 lb

## 2016-11-27 DIAGNOSIS — Z0000 Encounter for general adult medical examination without abnormal findings: Secondary | ICD-10-CM | POA: Diagnosis not present

## 2016-11-27 DIAGNOSIS — Z0001 Encounter for general adult medical examination with abnormal findings: Secondary | ICD-10-CM | POA: Insufficient documentation

## 2016-11-27 DIAGNOSIS — E119 Type 2 diabetes mellitus without complications: Secondary | ICD-10-CM | POA: Diagnosis not present

## 2016-11-27 LAB — CBC WITH DIFFERENTIAL/PLATELET
BASOS ABS: 0.1 10*3/uL (ref 0.0–0.1)
BASOS PCT: 1.4 % (ref 0.0–3.0)
EOS ABS: 0.5 10*3/uL (ref 0.0–0.7)
Eosinophils Relative: 5.8 % — ABNORMAL HIGH (ref 0.0–5.0)
HEMATOCRIT: 38.9 % — AB (ref 39.0–52.0)
HEMOGLOBIN: 13 g/dL (ref 13.0–17.0)
LYMPHS PCT: 35.4 % (ref 12.0–46.0)
Lymphs Abs: 3.1 10*3/uL (ref 0.7–4.0)
MCHC: 33.5 g/dL (ref 30.0–36.0)
MCV: 103.2 fl — ABNORMAL HIGH (ref 78.0–100.0)
MONO ABS: 0.7 10*3/uL (ref 0.1–1.0)
Monocytes Relative: 8 % (ref 3.0–12.0)
Neutro Abs: 4.3 10*3/uL (ref 1.4–7.7)
Neutrophils Relative %: 49.4 % (ref 43.0–77.0)
Platelets: 238 10*3/uL (ref 150.0–400.0)
RBC: 3.77 Mil/uL — AB (ref 4.22–5.81)
RDW: 13 % (ref 11.5–15.5)
WBC: 8.8 10*3/uL (ref 4.0–10.5)

## 2016-11-27 LAB — BASIC METABOLIC PANEL
BUN: 14 mg/dL (ref 6–23)
CHLORIDE: 106 meq/L (ref 96–112)
CO2: 30 mEq/L (ref 19–32)
Calcium: 9.6 mg/dL (ref 8.4–10.5)
Creatinine, Ser: 1.05 mg/dL (ref 0.40–1.50)
GFR: 72.02 mL/min (ref >60.00)
GLUCOSE: 121 mg/dL — AB (ref 70–99)
POTASSIUM: 5.7 meq/L — AB (ref 3.5–5.1)
SODIUM: 141 meq/L (ref 135–145)

## 2016-11-27 LAB — URINALYSIS, ROUTINE W REFLEX MICROSCOPIC
BILIRUBIN URINE: NEGATIVE
KETONES UR: NEGATIVE
LEUKOCYTES UA: NEGATIVE
Nitrite: NEGATIVE
PH: 5.5 (ref 5.0–8.0)
SPECIFIC GRAVITY, URINE: 1.015 (ref 1.000–1.030)
TOTAL PROTEIN, URINE-UPE24: NEGATIVE
UROBILINOGEN UA: 0.2 (ref 0.0–1.0)
Urine Glucose: NEGATIVE
WBC, UA: NONE SEEN (ref 0–?)

## 2016-11-27 LAB — LIPID PANEL
CHOL/HDL RATIO: 2
CHOLESTEROL: 107 mg/dL (ref 0–200)
HDL: 44.7 mg/dL (ref >39.00)
LDL CALC: 42 mg/dL (ref 0–99)
NonHDL: 62.59
TRIGLYCERIDES: 104 mg/dL (ref 0.0–149.0)
VLDL: 20.8 mg/dL (ref 0.0–40.0)

## 2016-11-27 LAB — HEPATIC FUNCTION PANEL
ALBUMIN: 3.9 g/dL (ref 3.5–5.2)
ALT: 12 U/L (ref 0–53)
AST: 14 U/L (ref 0–37)
Alkaline Phosphatase: 61 U/L (ref 39–117)
Bilirubin, Direct: 0.1 mg/dL (ref 0.0–0.3)
TOTAL PROTEIN: 6.7 g/dL (ref 6.0–8.3)
Total Bilirubin: 0.3 mg/dL (ref 0.2–1.2)

## 2016-11-27 LAB — PSA: PSA: 1.01 ng/mL (ref 0.10–4.00)

## 2016-11-27 LAB — TSH: TSH: 1.54 u[IU]/mL (ref 0.35–4.50)

## 2016-11-27 NOTE — Progress Notes (Signed)
Subjective:    Patient ID: Philip Kerr, male    DOB: Jan 25, 1933, 83 y.o.   MRN: [id number]  HPI  Lost to f/u since 2007 - Here for wellness, is not very forthcoming with hx, just wants to leave, but overall states doing ok;  Pt denies Chest pain, worsening SOB, DOE, wheezing, orthopnea, PND, worsening LE edema, palpitations, dizziness or syncope.  Pt denies neurological change such as new headache, facial or extremity weakness.  Pt denies polydipsia, polyuria, or low sugar symptoms. Pt states overall good compliance with treatment and medications, good tolerability, and has been trying to follow appropriate diet.  Pt denies worsening depressive symptoms, suicidal ideation or panic. No fever, night sweats, wt loss, loss of appetite, or other constitutional symptoms.  Pt states good ability with ADL's, has low fall risk, home safety reviewed and adequate, no other significant changes in hearing or vision, and not active with exercise. Wife who was her elast wk mentioned to me about memory issues.  He denies any memory problem.  Never tried the medication given for DM years ago.   BP at home < 140.90 - pt is adamant, does not want changes to tx Past Medical History:  Diagnosis Date  . Coronary artery disease    coronary artery bypass graft x 5 in 1996  . Diabetes mellitus   . Hypercholesterolemia    non-insulin dependent  . Hypertension    Past Surgical History:  Procedure Laterality Date  . CORONARY ARTERY BYPASS GRAFT    . LUNG SURGERY    . OTHER SURGICAL HISTORY     percutaneous coronary intervention of the  posterolateral segment on 01/27/1998    reports that  has never smoked. he has never used smokeless tobacco. He reports that he does not drink alcohol or use drugs. family history is not on file. No Known Allergies Current Outpatient Medications on File Prior to Visit  Medication Sig Dispense Refill  . aspirin 81 MG tablet Take 81 mg by mouth daily.    Marland Kitchen KLOR-CON M20 20 MEQ  tablet TAKE 1/2 TABLET BY MOUTH EVERY DAY 45 tablet 2  . losartan (COZAAR) 50 MG tablet TAKE 1 TABLET BY MOUTH EVERY DAY 90 tablet 2  . metoprolol tartrate (LOPRESSOR) 50 MG tablet TAKE 1/2 TABLET BY MOUTH TWICE A DAY 30 tablet 0  . rosuvastatin (CRESTOR) 20 MG tablet TAKE 1 TABLET (20 MG TOTAL) BY MOUTH DAILY. 30 tablet 0  . vitamin C (ASCORBIC ACID) 500 MG tablet Take 500 mg by mouth daily.    . furosemide (LASIX) 20 MG tablet Take 1 tablet (20 mg total) by mouth daily as needed (for swelling). 20 tablet 11   No current facility-administered medications on file prior to visit.    Review of Systems Constitutional: Negative for other unusual diaphoresis, sweats, appetite or weight changes HENT: Negative for other worsening hearing loss, ear pain, facial swelling, mouth sores or neck stiffness.   Eyes: Negative for other worsening pain, redness or other visual disturbance.  Respiratory: Negative for other stridor or swelling Cardiovascular: Negative for other palpitations or other chest pain  Gastrointestinal: Negative for worsening diarrhea or loose stools, blood in stool, distention or other pain Genitourinary: Negative for hematuria, flank pain or other change in urine volume.  Musculoskeletal: Negative for myalgias or other joint swelling.  Skin: Negative for other color change, or other wound or worsening drainage.  Neurological: Negative for other syncope or numbness. Hematological: Negative for other adenopathy or swelling  Psychiatric/Behavioral: Negative for hallucinations, other worsening agitation, SI, self-injury, or new decreased concentration All other system neg per pt    Objective:   Physical Exam BP (!) 153/58   Pulse (!) 57   Temp (!) 97.4 F (36.3 C)   Resp 20   Ht 5\' 10"  (1.778 m)   Wt 171 lb (77.6 kg)   SpO2 98%   BMI 24.54 kg/m  VS noted,  Constitutional: Pt is oriented to person, place, and time. Appears well-developed and well-nourished, in no significant  distress and comfortable Head: Normocephalic and atraumatic  Eyes: Conjunctivae and EOM are normal. Pupils are equal, round, and reactive to light Right Ear: External ear normal without discharge Left Ear: External ear normal without discharge Nose: Nose without discharge or deformity Mouth/Throat: Oropharynx is without other ulcerations and moist  Neck: Normal range of motion. Neck supple. No JVD present. No tracheal deviation present or significant neck LA or mass Cardiovascular: Normal rate, regular rhythm, normal heart sounds and intact distal pulses.   Pulmonary/Chest: WOB normal and breath sounds without rales or wheezing  Abdominal: Soft. Bowel sounds are normal. NT. No HSM  Musculoskeletal: Normal range of motion. Exhibits no edema Lymphadenopathy: Has no other cervical adenopathy.  Neurological: Pt is alert and oriented to person, place, and time but appears to have some difficulty with ST memory.  Pt has normal reflexes. No cranial nerve deficit. Motor grossly intact, Gait intact Skin: Skin is warm and dry. No rash noted or new ulcerations Psychiatric:  Has wary nervous mood and affect. Behavior is normal without agitation No other exam findings Lab Results  Component Value Date   WBC 10.8 (H) 10/22/2005   HGB 14.4 10/22/2005   HCT 42.8 10/22/2005   PLT 255 10/22/2005   GLUCOSE 111 (H) 05/31/2015   CHOL 102 (L) 05/31/2015   TRIG 119 05/31/2015   HDL 41 05/31/2015   LDLCALC 37 05/31/2015   ALT 12 05/31/2015   AST 14 05/31/2015   NA 134 (L) 05/31/2015   K 5.0 05/31/2015   CL 104 05/31/2015   CREATININE 0.97 05/31/2015   BUN 15 05/31/2015   CO2 25 05/31/2015   TSH 2.48 10/22/2005   PSA 0.52 10/22/2005   HGBA1C 7.0 (H) 10/22/2005       Assessment & Plan:

## 2016-11-27 NOTE — Patient Instructions (Addendum)
Please continue all other medications as before, and refills have been done if requested.  Please have the pharmacy call with any other refills you may need.  Please continue your efforts at being more active, low cholesterol diet, and weight control.  You are otherwise up to date with prevention measures today.  Please keep your appointments with your specialists as you may have planned  Please go to the LAB in the Basement (turn left off the elevator) for the tests to be done today  You will be contacted by phone if any changes need to be made immediately.  Otherwise, you will receive a letter about your results with an explanation, but please check with MyChart first.  Please remember to sign up for MyChart if you have not done so, as this will be important to you in the future with finding out test results, communicating by private email, and scheduling acute appointments online when needed.  Please return in 6 months, or sooner if needed  Continue doing brain stimulating activities (puzzles, reading, adult coloring books, staying active) to keep memory sharp.   Continue to eat heart healthy diet (full of fruits, vegetables, whole grains, lean protein, water--limit salt, fat, and sugar intake) and increase physical activity as tolerated.   Philip Kerr , Thank you for taking time to come for your Medicare Wellness Visit. I appreciate your ongoing commitment to your health goals. Please review the following plan we discussed and let me know if I can assist you in the future.   These are the goals we discussed: Goals    . Patient Stated     Stay as active and as independent as possible       This is a list of the screening recommended for you and due dates:  Health Maintenance  Topic Date Due  . Eye exam for diabetics  09/27/1945  . Tetanus Vaccine  09/28/1954  . Hemoglobin A1C  04/23/2006  . Pneumonia vaccines (1 of 2 - PCV13) 11/27/2017*  . Complete foot exam   11/27/2017   . Flu Shot  Completed  *Topic was postponed. The date shown is not the original due date.

## 2016-11-27 NOTE — Assessment & Plan Note (Signed)
Pt declines a1c today

## 2016-11-27 NOTE — Assessment & Plan Note (Signed)

## 2016-12-18 ENCOUNTER — Other Ambulatory Visit: Payer: Self-pay | Admitting: Nurse Practitioner

## 2017-02-15 ENCOUNTER — Other Ambulatory Visit: Payer: Self-pay | Admitting: Cardiovascular Disease

## 2017-03-04 ENCOUNTER — Other Ambulatory Visit: Payer: Self-pay | Admitting: Cardiovascular Disease

## 2017-03-08 ENCOUNTER — Ambulatory Visit: Payer: Medicare HMO | Admitting: Cardiovascular Disease

## 2017-03-20 ENCOUNTER — Ambulatory Visit: Payer: Medicare HMO | Admitting: Nurse Practitioner

## 2017-03-20 ENCOUNTER — Encounter: Payer: Self-pay | Admitting: Nurse Practitioner

## 2017-03-20 VITALS — BP 130/73 | HR 64 | Ht 70.0 in | Wt 172.4 lb

## 2017-03-20 DIAGNOSIS — I255 Ischemic cardiomyopathy: Secondary | ICD-10-CM

## 2017-03-20 DIAGNOSIS — I447 Left bundle-branch block, unspecified: Secondary | ICD-10-CM

## 2017-03-20 DIAGNOSIS — I10 Essential (primary) hypertension: Secondary | ICD-10-CM

## 2017-03-20 DIAGNOSIS — E7800 Pure hypercholesterolemia, unspecified: Secondary | ICD-10-CM

## 2017-03-20 DIAGNOSIS — I2510 Atherosclerotic heart disease of native coronary artery without angina pectoris: Secondary | ICD-10-CM

## 2017-03-20 NOTE — Patient Instructions (Addendum)
We will be checking the following labs today - NONE   Medication Instructions:    Continue with your current medicines.     Testing/Procedures To Be Arranged:  N/A  Follow-Up:   See me in one year.     Other Special Instructions:   N/A    If you need a refill on your cardiac medications before your next appointment, please call your pharmacy.   Call the [facility] office at [phone] if you have any questions, problems or concerns.

## 2017-03-20 NOTE — Progress Notes (Signed)
CARDIOLOGY OFFICE NOTE  Date:  03/20/2017    Philip Kerr Date of Birth: 10-23-1933 Medical Record #[id number]  PCP:  Biagio Borg, MD  Cardiologist:  Aldona Bar    Chief Complaint  Patient presents with  . Coronary Artery Disease  . Congestive Heart Failure    1 year check - seen for Dr. Angelena Form    History of Present Illness: Philip Kerr is a 84 y.o. male who presents today for a one year check. Seen for Dr. Angelena Form. Former patient of Dr. Maren Beach.   He has a history of CAD s/p 5V CABG 1996, HTN, HLD, & DM. Echo October 2015 with LVEF=35=40%, moderate LVH, trivial AI, moderate MR. Stress myoview May 2014 without ischemia, lateral scar. Cardiac monitor 2015 with PVCs. He did not tolerate higher doses of beta blockers. He has been reluctant to consider cardiac testing and has refused to consider an ICD.   I have not seen him since May of 2017. Last sen by Dr. Angelena Form back in March of 2018.   Comes in today. Here alone. He tells me is ok. Tells me "I'm old and ready to die". No chest pain. Breathing ok. Still working some. Walking. Labs from November noted. Told me during the visit that "I may have had a stroke". Then says he "fell out" of the shower and was "out for 4 hours". Does not remember when this happened. Says "I don't give a crap, I'm going to die anyway". Did not seek any medical attention and has no plans to.   Past Medical History:  Diagnosis Date  . Coronary artery disease    coronary artery bypass graft x 5 in 1996  . Diabetes mellitus   . Hypercholesterolemia    non-insulin dependent  . Hypertension     Past Surgical History:  Procedure Laterality Date  . CORONARY ARTERY BYPASS GRAFT    . LUNG SURGERY    . OTHER SURGICAL HISTORY     percutaneous coronary intervention of the  posterolateral segment on 01/27/1998     Medications: Current Meds  Medication Sig  . aspirin 81 MG tablet Take 81 mg by mouth daily.  Marland Kitchen KLOR-CON M20 20  MEQ tablet TAKE 1/2 TABLET BY MOUTH EVERY DAY  . losartan (COZAAR) 50 MG tablet TAKE 1 TABLET BY MOUTH EVERY DAY  . metoprolol tartrate (LOPRESSOR) 50 MG tablet TAKE 1/2 TABLET BY MOUTH TWICE A DAY  . rosuvastatin (CRESTOR) 20 MG tablet TAKE 1 TABLET BY MOUTH EVERY DAY  . vitamin C (ASCORBIC ACID) 500 MG tablet Take 500 mg by mouth daily.     Allergies: No Known Allergies  Social History: The patient  reports that  has never smoked. he has never used smokeless tobacco. He reports that he does not drink alcohol or use drugs.   Family History: The patient's family history is not on file.   Review of Systems: Please see the history of present illness.   Otherwise, the review of systems is positive for none.   All other systems are reviewed and negative.   Physical Exam: VS:  BP 130/73   Pulse 64   Ht 5\' 10"  (1.778 m)   Wt 172 lb 6.4 oz (78.2 kg)   BMI 24.74 kg/m  .  BMI Body mass index is 24.74 kg/m.  Wt Readings from Last 3 Encounters:  03/20/17 172 lb 6.4 oz (78.2 kg)  11/27/16 171 lb (77.6 kg)  04/02/16 180 lb 9.6  oz (81.9 kg)    General: Elderly. Alert and in no acute distress.   HEENT: Normal. Broken teeth.  Neck: Supple, no JVD, carotid bruits, or masses noted.  Cardiac: Regular rate and rhythm. Heart tones are distant.  No edema.  Respiratory:  Lungs are clear to auscultation bilaterally with normal work of breathing.  GI: Soft and nontender.  MS: No deformity or atrophy. Gait and ROM intact.  Skin: Warm and dry. Color is normal.  Neuro:  Strength and sensation are intact and no gross focal deficits noted.  Psych: Alert, appropriate and with normal affect.   LABORATORY DATA:  EKG:  EKG is ordered today. This demonstrates sinus with PVCs  Lab Results  Component Value Date   WBC 8.8 11/27/2016   HGB 13.0 11/27/2016   HCT 38.9 (L) 11/27/2016   PLT 238.0 11/27/2016   GLUCOSE 121 (H) 11/27/2016   CHOL 107 11/27/2016   TRIG 104.0 11/27/2016   HDL 44.70  11/27/2016   LDLCALC 42 11/27/2016   ALT 12 11/27/2016   AST 14 11/27/2016   NA 141 11/27/2016   K 5.7 (H) 11/27/2016   CL 106 11/27/2016   CREATININE 1.05 11/27/2016   BUN 14 11/27/2016   CO2 30 11/27/2016   TSH 1.54 11/27/2016   PSA 1.01 11/27/2016   HGBA1C 7.0 (H) 10/22/2005     BNP (last 3 results) No results for input(s): BNP in the last 8760 hours.  ProBNP (last 3 results) No results for input(s): PROBNP in the last 8760 hours.   Other Studies Reviewed Today:  Echo Study Conclusions 2015  - Left ventricle: There is apical septal dyskinesis due to paradoxical septal motion from BBB. The cavity size was normal. There was moderate concentric hypertrophy. Systolic function was moderately reduced. The estimated ejection fraction was in the range of 35% to 40%. There is akinesis of the basalinferior myocardium. There is severe hypokinesis of the apicalinferior myocardium. There is hypokinesis of the entireinferolateral myocardium. - Ventricular septum: Septal motion showed paradox. - Aortic valve: There was trivial regurgitation. - Aorta: Ascending aortic diameter: 41 mm (S). - Ascending aorta: The ascending aorta was mildly dilated. - Mitral valve: There was moderate regurgitation. - Left atrium: The atrium was moderately dilated. - Tricuspid valve: There was mild regurgitation.    Assessment/Plan:  1. Probable syncopal spell - does not say when this happened, wants no evaluation/treatment/labs. Probably was a cardiac event.   2. CAD - managed medically. No active chest pain that he endorses.   3. Cardiomyopathy, ischemic: LVEF=35-40% by echo 2015. Continue beta blocker and ARB. He does not wish for any further testing/evaluation.   4. Hyperlipidemia: labs by PCP   5. LBBB, chronic   6. HTN: BP is controlled at home. No changes made today  6. Chronic systolic CHF:  Has Lasix - sounds like rare prn use.    Current medicines are reviewed  with the patient today.  The patient does not have concerns regarding medicines other than what has been noted above.  The following changes have been made:  See above.  Labs/ tests ordered today include:   No orders of the defined types were placed in this encounter.    Disposition:   FU with me or Dr. Angelena Form in one year.    Patient is agreeable to this plan and will call if any problems develop in the interim.   SignedTruitt Merle, NP  03/20/2017 4:08 PM  [facility]  [address] [place], [place]  [address] Phone: [phone] Fax: [phone]

## 2017-03-22 ENCOUNTER — Other Ambulatory Visit: Payer: Self-pay | Admitting: Cardiovascular Disease

## 2017-03-22 NOTE — Addendum Note (Signed)
Addended by: Gaetano Net on: 03/22/2017 07:52 AM   Modules accepted: Orders

## 2017-05-31 ENCOUNTER — Other Ambulatory Visit: Payer: Self-pay | Admitting: Cardiovascular Disease

## 2017-06-28 ENCOUNTER — Other Ambulatory Visit: Payer: Self-pay | Admitting: Cardiovascular Disease

## 2017-08-23 ENCOUNTER — Encounter: Payer: Self-pay | Admitting: Internal Medicine

## 2017-08-23 ENCOUNTER — Ambulatory Visit (INDEPENDENT_AMBULATORY_CARE_PROVIDER_SITE_OTHER): Payer: Medicare HMO | Admitting: Internal Medicine

## 2017-08-23 VITALS — BP 120/42 | HR 59 | Temp 98.0°F | Wt 172.0 lb

## 2017-08-23 DIAGNOSIS — R739 Hyperglycemia, unspecified: Secondary | ICD-10-CM | POA: Diagnosis not present

## 2017-08-23 DIAGNOSIS — R350 Frequency of micturition: Secondary | ICD-10-CM

## 2017-08-23 DIAGNOSIS — I10 Essential (primary) hypertension: Secondary | ICD-10-CM | POA: Diagnosis not present

## 2017-08-23 DIAGNOSIS — R300 Dysuria: Secondary | ICD-10-CM | POA: Diagnosis not present

## 2017-08-23 DIAGNOSIS — G4700 Insomnia, unspecified: Secondary | ICD-10-CM

## 2017-08-23 LAB — POCT URINALYSIS DIPSTICK
BILIRUBIN UA: NEGATIVE
Blood, UA: NEGATIVE
GLUCOSE UA: NEGATIVE
Ketones, UA: NEGATIVE
Leukocytes, UA: NEGATIVE
Nitrite, UA: NEGATIVE
Protein, UA: NEGATIVE
Spec Grav, UA: >=1.03 — AB (ref 1.010–1.025)
UROBILINOGEN UA: 0.2 U/dL
pH, UA: 6 (ref 5.0–8.0)

## 2017-08-23 MED ORDER — TRAZODONE HCL 50 MG PO TABS: 25.0000 mg | ORAL_TABLET | Freq: Every evening | ORAL | 1 refills | Status: DC | PRN

## 2017-08-23 MED ORDER — CIPROFLOXACIN HCL 500 MG PO TABS: 500.0000 mg | ORAL_TABLET | Freq: Two times a day (BID) | ORAL | 0 refills | Status: AC

## 2017-08-23 NOTE — Assessment & Plan Note (Signed)
stable overall by history and exam, recent data reviewed with pt, and pt to continue medical treatment as before,  to f/u any worsening symptoms or concerns Lab Results  Component Value Date   HGBA1C 7.0 (H) 10/22/2005

## 2017-08-23 NOTE — Patient Instructions (Signed)
Please take all new medication as prescribed - the antibiotic  You can also take AZO (OTC) for urinating pain  Your specimen will be sent for the culture  Please take all new medication as prescribed - the trazodone for sleep  Please continue all other medications as before, and refills have been done if requested.  Please have the pharmacy call with any other refills you may need.  Please continue your efforts at being more active, low cholesterol diet, and weight control.  Please keep your appointments with your specialists as you may have planned  Please return in 6 months, or sooner if needed, with Lab testing done 3-5 days before

## 2017-08-23 NOTE — Assessment & Plan Note (Signed)
With mod to high suspicion for GU infection, for empiric antibx, urine studies today, also otc AZO prn ok

## 2017-08-23 NOTE — Assessment & Plan Note (Signed)
stable overall by history and exam, recent data reviewed with pt, and pt to continue medical treatment as before,  to f/u any worsening symptoms or concerns BP Readings from Last 3 Encounters:  08/23/17 (!) 120/42  03/20/17 130/73  11/27/16 (!) 153/58

## 2017-08-23 NOTE — Assessment & Plan Note (Signed)
Mild to mod, for trazodone qhs prn,  to f/u any worsening symptoms or concerns 

## 2017-08-23 NOTE — Progress Notes (Signed)
Subjective:    Patient ID: Philip Kerr, male    DOB: 1933-01-20, 84 y.o.   MRN: [id number]  HPI  Here with acute onset 3-4 days dysuria, for some reason maybe better this am, but has been mild to mod, intermittent, and assoc with some frequency but Denies urinary symptoms such as urgency, flank pain, hematuria or n/v, fever, chills. Pt denies chest pain, increased sob or doe, wheezing, orthopnea, PND, increased LE swelling, palpitations, dizziness or syncope.  Pt denies new neurological symptoms such as new headache, or facial or extremity weakness or numbness   Pt denies polydipsia, polyuria.  Also with increased difficutly with getting to sleep most nights for the last several nights,  Denies worsening depressive symptoms, suicidal ideation, or panic; has ongoing anxiety, wants to leave quickly today Past Medical History:  Diagnosis Date  . Coronary artery disease    coronary artery bypass graft x 5 in 1996  . Diabetes mellitus   . Hypercholesterolemia    non-insulin dependent  . Hypertension    Past Surgical History:  Procedure Laterality Date  . CORONARY ARTERY BYPASS GRAFT    . LUNG SURGERY    . OTHER SURGICAL HISTORY     percutaneous coronary intervention of the  posterolateral segment on 01/27/1998    reports that he has never smoked. He has never used smokeless tobacco. He reports that he does not drink alcohol or use drugs. family history is not on file. No Known Allergies Current Outpatient Medications on File Prior to Visit  Medication Sig Dispense Refill  . aspirin 81 MG tablet Take 81 mg by mouth daily.    Marland Kitchen KLOR-CON M20 20 MEQ tablet TAKE 1/2 TABLET BY MOUTH EVERY DAY 45 tablet 2  . losartan (COZAAR) 50 MG tablet TAKE 1 TABLET BY MOUTH EVERY DAY 90 tablet 2  . metoprolol tartrate (LOPRESSOR) 50 MG tablet TAKE 1/2 TABLET BY MOUTH TWICE A DAY 90 tablet 3  . rosuvastatin (CRESTOR) 20 MG tablet TAKE 1 TABLET BY MOUTH EVERY DAY 90 tablet 3  . vitamin C (ASCORBIC ACID)  500 MG tablet Take 500 mg by mouth daily.    . furosemide (LASIX) 20 MG tablet Take 1 tablet (20 mg total) by mouth daily as needed (for swelling). 20 tablet 11   No current facility-administered medications on file prior to visit.    Review of Systems  Constitutional: Negative for other unusual diaphoresis or sweats HENT: Negative for ear discharge or swelling Eyes: Negative for other worsening visual disturbances Respiratory: Negative for stridor or other swelling  Gastrointestinal: Negative for worsening distension or other blood Genitourinary: Negative for retention or other urinary change Musculoskeletal: Negative for other MSK pain or swelling Skin: Negative for color change or other new lesions Neurological: Negative for worsening tremors and other numbness  Psychiatric/Behavioral: Negative for worsening agitation or other fatigue All other system neg per pt    Objective:   Physical Exam BP (!) 120/42 (BP Location: Left Arm, Patient Position: Sitting, Cuff Size: Normal)   Pulse (!) 59   Temp 98 F (36.7 C) (Oral)   Wt 172 lb (78 kg)   SpO2 97%   BMI 24.68 kg/m  VS noted, nervous, mild ill appearing Constitutional: Pt appears in NAD HENT: Head: NCAT.  Right Ear: External ear normal.  Left Ear: External ear normal.  Eyes: . Pupils are equal, round, and reactive to light. Conjunctivae and EOM are normal Nose: without d/c or deformity Neck: Neck supple. Gross normal  ROM Cardiovascular: Normal rate and regular rhythm.   Pulmonary/Chest: Effort normal and breath sounds without rales or wheezing.  Abd:  Soft, ND, + BS, no organomegaly, mild low mid abd tender, no flank tender Neurological: Pt is alert. At baseline orientation, motor grossly intact Skin: Skin is warm. No rashes, other new lesions, no LE edema Psychiatric: Pt behavior is normal without agitation  No other exam findings  Declines further lab today  Contains abnormal data POCT Urinalysis Dipstick  Order:  [id number]  Status:  Final result Visible to patient:  No (Not Released) Dx:  Urine frequency   Ref Range & Units 09:22 43mo ago 26yr ago  Color, UA  yellow     Clarity, UA  clear     Glucose, UA Negative Negative     Bilirubin, UA  negative     Ketones, UA  Negative     Spec Grav, UA 1.010 - 1.025 >=1.030Abnormal      Blood, UA  NEGATIVE     pH, UA 5.0 - 8.0 6.0     Protein, UA Negative Negative     Urobilinogen, UA 0.2 or 1.0 E.U./dL 0.2  0.2 R 0.2 mg/dL R  Nitrite, UA  NEGATIVE     Leukocytes, UA Negative Negative  NEGATIVE  Negative R  Appearance  CLEAR  CLEAR    Odor  NONE              Assessment & Plan:

## 2017-08-27 ENCOUNTER — Ambulatory Visit (INDEPENDENT_AMBULATORY_CARE_PROVIDER_SITE_OTHER): Payer: Medicare HMO | Admitting: Internal Medicine

## 2017-08-27 ENCOUNTER — Encounter: Payer: Self-pay | Admitting: Internal Medicine

## 2017-08-27 VITALS — BP 116/62 | HR 53 | Temp 97.7°F | Ht 70.0 in | Wt 167.0 lb

## 2017-08-27 DIAGNOSIS — I10 Essential (primary) hypertension: Secondary | ICD-10-CM | POA: Diagnosis not present

## 2017-08-27 DIAGNOSIS — R300 Dysuria: Secondary | ICD-10-CM | POA: Diagnosis not present

## 2017-08-27 DIAGNOSIS — G2581 Restless legs syndrome: Secondary | ICD-10-CM | POA: Diagnosis not present

## 2017-08-27 DIAGNOSIS — R739 Hyperglycemia, unspecified: Secondary | ICD-10-CM | POA: Diagnosis not present

## 2017-08-27 DIAGNOSIS — R21 Rash and other nonspecific skin eruption: Secondary | ICD-10-CM | POA: Diagnosis not present

## 2017-08-27 MED ORDER — PRAMIPEXOLE DIHYDROCHLORIDE 0.25 MG PO TABS: 0.2500 mg | ORAL_TABLET | Freq: Every day | ORAL | 3 refills | Status: DC

## 2017-08-27 MED ORDER — TRIAMCINOLONE ACETONIDE 0.1 % EX CREA: 1.0000 "application " | TOPICAL_CREAM | Freq: Two times a day (BID) | CUTANEOUS | 2 refills | Status: AC | PRN

## 2017-08-27 MED ORDER — METHYLPREDNISOLONE ACETATE 80 MG/ML IJ SUSP
80.0000 mg | Freq: Once | INTRAMUSCULAR | Status: AC
  Administered 2017-08-27: 80 mg via INTRAMUSCULAR

## 2017-08-27 MED ORDER — PREDNISONE 10 MG PO TABS: ORAL_TABLET | ORAL | 0 refills | Status: DC

## 2017-08-27 NOTE — Assessment & Plan Note (Signed)
stable overall by history and exam, recent data reviewed with pt, and pt to continue medical treatment as before,  to f/u any worsening symptoms or concerns Lab Results  Component Value Date   HGBA1C 7.0 (H) 10/22/2005

## 2017-08-27 NOTE — Assessment & Plan Note (Signed)
Resolved, cont to monitor 

## 2017-08-27 NOTE — Progress Notes (Signed)
Subjective:    Patient ID: Philip Kerr, male    DOB: [birth year], 84 y.o.   MRN: [id number]  HPI  Here to f/u; overall doing ok,  Pt denies chest pain, increasing sob or doe, wheezing, orthopnea, PND, increased LE swelling, palpitations, dizziness or syncope.  Pt denies new neurological symptoms such as new headache, or facial or extremity weakness or numbness.  Pt denies polydipsia, polyuria, or low sugar episode.  Pt states overall good compliance with meds, mostly trying to follow appropriate diet, with wt overall stable,  but little exercise however. Does have large area upper and lower back bilat with itchy red rash of uncertain etiology, sweats but changes shirt twice per day every day.  No fever or pain, just marked itch for 2 wks  Also recently has been kicking the wife with legs running most nights in his sleep Past Medical History:  Diagnosis Date  . Coronary artery disease    coronary artery bypass graft x 5 in 1996  . Diabetes mellitus   . Hypercholesterolemia    non-insulin dependent  . Hypertension    Past Surgical History:  Procedure Laterality Date  . CORONARY ARTERY BYPASS GRAFT    . LUNG SURGERY    . OTHER SURGICAL HISTORY     percutaneous coronary intervention of the  posterolateral segment on 01/27/1998    reports that he has never smoked. He has never used smokeless tobacco. He reports that he does not drink alcohol or use drugs. family history is not on file. No Known Allergies Current Outpatient Medications on File Prior to Visit  Medication Sig Dispense Refill  . aspirin 81 MG tablet Take 81 mg by mouth daily.    . ciprofloxacin (CIPRO) 500 MG tablet Take 1 tablet (500 mg total) by mouth 2 (two) times daily for 10 days. 20 tablet 0  . KLOR-CON M20 20 MEQ tablet TAKE 1/2 TABLET BY MOUTH EVERY DAY 45 tablet 2  . losartan (COZAAR) 50 MG tablet TAKE 1 TABLET BY MOUTH EVERY DAY 90 tablet 2  . metoprolol tartrate (LOPRESSOR) 50 MG tablet TAKE 1/2 TABLET BY MOUTH  TWICE A DAY 90 tablet 3  . rosuvastatin (CRESTOR) 20 MG tablet TAKE 1 TABLET BY MOUTH EVERY DAY 90 tablet 3  . traZODone (DESYREL) 50 MG tablet Take 0.5-1 tablets (25-50 mg total) by mouth at bedtime as needed for sleep. 90 tablet 1  . vitamin C (ASCORBIC ACID) 500 MG tablet Take 500 mg by mouth daily.    . furosemide (LASIX) 20 MG tablet Take 1 tablet (20 mg total) by mouth daily as needed (for swelling). 20 tablet 11   No current facility-administered medications on file prior to visit.    Review of Systems  Constitutional: Negative for other unusual diaphoresis or sweats HENT: Negative for ear discharge or swelling Eyes: Negative for other worsening visual disturbances Respiratory: Negative for stridor or other swelling  Gastrointestinal: Negative for worsening distension or other blood Genitourinary: Negative for retention or other urinary change Musculoskeletal: Negative for other MSK pain or swelling Skin: Negative for color change or other new lesions Neurological: Negative for worsening tremors and other numbness  Psychiatric/Behavioral: Negative for worsening agitation or other fatigue All other system neg per pt    Objective:   Physical Exam BP 116/62   Pulse (!) 53   Temp 97.7 F (36.5 C) (Oral)   Ht 5\' 10"  (1.778 m)   Wt 167 lb (75.8 kg)   SpO2 96%  BMI 23.96 kg/m  VS noted, not ill appearing Constitutional: Pt appears in NAD HENT: Head: NCAT.  Right Ear: External ear normal.  Left Ear: External ear normal.  Eyes: . Pupils are equal, round, and reactive to light. Conjunctivae and EOM are normal Nose: without d/c or deformity Neck: Neck supple. Gross normal ROM Cardiovascular: Normal rate and regular rhythm.   Pulmonary/Chest: Effort normal and breath sounds without rales or wheezing.  Abd:  Soft, NT, ND, + BS, no organomegaly Neurological: Pt is alert. At baseline orientation, motor grossly intact Skin: Skin is warm. + diffuse TNTC very small eryth lesion rash  to bilat back upper and lower, other new lesions, no LE edema Psychiatric: Pt behavior is normal without agitation  No other exam findings Lab Results  Component Value Date   WBC 8.8 11/27/2016   HGB 13.0 11/27/2016   HCT 38.9 (L) 11/27/2016   PLT 238.0 11/27/2016   GLUCOSE 121 (H) 11/27/2016   CHOL 107 11/27/2016   TRIG 104.0 11/27/2016   HDL 44.70 11/27/2016   LDLCALC 42 11/27/2016   ALT 12 11/27/2016   AST 14 11/27/2016   NA 141 11/27/2016   K 5.7 (H) 11/27/2016   CL 106 11/27/2016   CREATININE 1.05 11/27/2016   BUN 14 11/27/2016   CO2 30 11/27/2016   TSH 1.54 11/27/2016   PSA 1.01 11/27/2016   HGBA1C 7.0 (H) 10/22/2005       Assessment & Plan:

## 2017-08-27 NOTE — Patient Instructions (Signed)
You had the steroid shot today  Please take all new medication as prescribed - the prednisone, and also the steroid cream if needed  Please take all new medication as prescribed - the mirapex for the legs at night  You can also use OTC topical Benadryl cream for itching if needed  Please continue all other medications as before, and refills have been done if requested.  Please have the pharmacy call with any other refills you may need.  Please keep your appointments with your specialists as you may have planned

## 2017-08-27 NOTE — Assessment & Plan Note (Signed)
stable overall by history and exam, recent data reviewed with pt, and pt to continue medical treatment as before,  to f/u any worsening symptoms or concerns BP Readings from Last 3 Encounters:  08/27/17 116/62  08/23/17 (!) 120/42  03/20/17 130/73

## 2017-08-27 NOTE — Assessment & Plan Note (Signed)
Recent onset vs PLMD - for mirapex low dose at bedtime,  to f/u any worsening symptoms or concerns

## 2017-08-27 NOTE — Assessment & Plan Note (Signed)
C/w inflammatory dermatitis, etiology unclear, for depomedrol IM 80, predpac asd, and topical steroid prn

## 2017-09-02 ENCOUNTER — Other Ambulatory Visit: Payer: Self-pay | Admitting: Internal Medicine

## 2017-09-02 DIAGNOSIS — R300 Dysuria: Secondary | ICD-10-CM

## 2017-09-02 NOTE — Telephone Encounter (Signed)
Patient is having UTI symptoms after stopping the 10 day Cipro ordered on 08/23/17, burning and frequency. He's is wondering if he can be prescribed the Cipro again.  Cipro refill Last Refill:08/23/17 # 20/0 Last OV: 08/23/17 PCP: [place]: [id number] - [place], [place] - [address] [phone] (Phone) [phone] (Fax)

## 2017-09-02 NOTE — Telephone Encounter (Signed)
Copied from [place]. Topic: Quick Communication - See Telephone Encounter >> Sep 02, 2017  2:01 PM Mylinda Latina, NT wrote: CRM for notification. See Telephone encounter for: 09/02/17. Patient wife called and states that patient was prescribed ciprofloxacin (CIPRO) 500 MG tablet for a ? UTI ( wife is unsure ) she states the symptoms is coming back ( burning sensation , frequency urination. The patient is wondering if the provider can prescribed this medication again  [id number] - [place], [place] - [address] [phone] (Phone) [phone] (Fax)

## 2017-09-02 NOTE — Telephone Encounter (Signed)
I would not restart the cipro unless we can show there is infection trying to restart -   Ok for UA and Culture - if abnormal, we would probably try a different antibiotic

## 2017-09-03 ENCOUNTER — Other Ambulatory Visit (INDEPENDENT_AMBULATORY_CARE_PROVIDER_SITE_OTHER): Payer: Medicare HMO

## 2017-09-03 DIAGNOSIS — R300 Dysuria: Secondary | ICD-10-CM

## 2017-09-03 LAB — URINALYSIS, ROUTINE W REFLEX MICROSCOPIC
BILIRUBIN URINE: NEGATIVE
HGB URINE DIPSTICK: NEGATIVE
Ketones, ur: NEGATIVE
LEUKOCYTES UA: NEGATIVE
NITRITE: NEGATIVE
RBC / HPF: NONE SEEN (ref 0–?)
Specific Gravity, Urine: 1.015 (ref 1.000–1.030)
Total Protein, Urine: NEGATIVE
Urine Glucose: NEGATIVE
Urobilinogen, UA: 0.2 (ref 0.0–1.0)
WBC UA: NONE SEEN (ref 0–?)
pH: 5.5 (ref 5.0–8.0)

## 2017-09-03 NOTE — Addendum Note (Signed)
Addended by: Juliet Rude on: 09/03/2017 08:42 AM   Modules accepted: Orders

## 2017-09-03 NOTE — Telephone Encounter (Signed)
Pt has been informed and labs have been ordered.

## 2017-09-04 ENCOUNTER — Telehealth: Payer: Self-pay

## 2017-09-04 LAB — URINE CULTURE
MICRO NUMBER:: 91023027
Result:: NO GROWTH
SPECIMEN QUALITY:: ADEQUATE

## 2017-09-04 NOTE — Telephone Encounter (Signed)
Pt has been informed and expressed understanding.  

## 2017-09-04 NOTE — Telephone Encounter (Signed)
-----   Message from Biagio Borg, MD sent at 09/03/2017  2:36 PM EDT ----- Ok to let pt know - UA negative, culture pending, so no need for further antibx for now

## 2017-09-04 NOTE — Telephone Encounter (Signed)
-----   Message from Biagio Borg, MD sent at 09/04/2017  3:42 PM EDT ----- Ok to let pt now  - urine cx negative, so no indication for further antibx

## 2017-09-05 ENCOUNTER — Telehealth: Payer: Self-pay | Admitting: Internal Medicine

## 2017-09-05 NOTE — Telephone Encounter (Signed)
I have scheduled patient for appt tomorrow with Dr. Jenny Reichmann (8/30) for 9:20am.

## 2017-09-05 NOTE — Telephone Encounter (Signed)
Copied from [place] [id number]. Topic: Quick Communication - See Telephone Encounter >> Sep 05, 2017  2:13 PM Antonieta Iba C wrote: CRM for notification. See Telephone encounter for: 09/05/17.  Pt's spouse called in. They are requesting imaging due to pt's cough. Spouse says that years ago pt was told that his lungs was filling up with fluid.    Please assist further.

## 2017-09-06 ENCOUNTER — Encounter: Payer: Self-pay | Admitting: Internal Medicine

## 2017-09-06 ENCOUNTER — Other Ambulatory Visit (INDEPENDENT_AMBULATORY_CARE_PROVIDER_SITE_OTHER): Payer: Medicare HMO

## 2017-09-06 ENCOUNTER — Telehealth: Payer: Self-pay

## 2017-09-06 ENCOUNTER — Ambulatory Visit (INDEPENDENT_AMBULATORY_CARE_PROVIDER_SITE_OTHER)
Admission: RE | Admit: 2017-09-06 | Discharge: 2017-09-06 | Disposition: A | Discharge status: Home / Self Care | Payer: Medicare HMO | Source: Ambulatory Visit | Attending: Internal Medicine | Admitting: Internal Medicine

## 2017-09-06 ENCOUNTER — Ambulatory Visit (INDEPENDENT_AMBULATORY_CARE_PROVIDER_SITE_OTHER): Payer: Medicare HMO | Admitting: Internal Medicine

## 2017-09-06 VITALS — BP 140/76 | HR 53 | Temp 97.6°F | Ht 70.0 in | Wt 172.0 lb

## 2017-09-06 DIAGNOSIS — E7800 Pure hypercholesterolemia, unspecified: Secondary | ICD-10-CM

## 2017-09-06 DIAGNOSIS — R059 Cough, unspecified: Secondary | ICD-10-CM | POA: Insufficient documentation

## 2017-09-06 DIAGNOSIS — R05 Cough: Secondary | ICD-10-CM | POA: Diagnosis not present

## 2017-09-06 DIAGNOSIS — Z23 Encounter for immunization: Secondary | ICD-10-CM | POA: Diagnosis not present

## 2017-09-06 DIAGNOSIS — E559 Vitamin D deficiency, unspecified: Secondary | ICD-10-CM

## 2017-09-06 DIAGNOSIS — I10 Essential (primary) hypertension: Secondary | ICD-10-CM

## 2017-09-06 DIAGNOSIS — R5383 Other fatigue: Secondary | ICD-10-CM

## 2017-09-06 DIAGNOSIS — J439 Emphysema, unspecified: Secondary | ICD-10-CM | POA: Diagnosis not present

## 2017-09-06 DIAGNOSIS — E538 Deficiency of other specified B group vitamins: Secondary | ICD-10-CM

## 2017-09-06 DIAGNOSIS — I255 Ischemic cardiomyopathy: Secondary | ICD-10-CM

## 2017-09-06 DIAGNOSIS — Z0001 Encounter for general adult medical examination with abnormal findings: Secondary | ICD-10-CM

## 2017-09-06 DIAGNOSIS — R739 Hyperglycemia, unspecified: Secondary | ICD-10-CM

## 2017-09-06 LAB — BASIC METABOLIC PANEL
BUN: 47 mg/dL — AB (ref 6–23)
CHLORIDE: 105 meq/L (ref 96–112)
CO2: 27 mEq/L (ref 19–32)
CREATININE: 1.18 mg/dL (ref 0.40–1.50)
Calcium: 9.1 mg/dL (ref 8.4–10.5)
GFR: 62.82 mL/min (ref >60.00)
Glucose, Bld: 108 mg/dL — ABNORMAL HIGH (ref 70–99)
Potassium: 6 mEq/L — ABNORMAL HIGH (ref 3.5–5.1)
Sodium: 136 mEq/L (ref 135–145)

## 2017-09-06 LAB — HEPATIC FUNCTION PANEL
ALK PHOS: 55 U/L (ref 39–117)
ALT: 16 U/L (ref 0–53)
AST: 16 U/L (ref 0–37)
Albumin: 3.7 g/dL (ref 3.5–5.2)
BILIRUBIN DIRECT: 0.1 mg/dL (ref 0.0–0.3)
TOTAL PROTEIN: 6.4 g/dL (ref 6.0–8.3)
Total Bilirubin: 0.4 mg/dL (ref 0.2–1.2)

## 2017-09-06 LAB — HEMOGLOBIN A1C: HEMOGLOBIN A1C: 6.8 % — AB (ref 4.6–6.5)

## 2017-09-06 LAB — LIPID PANEL
CHOL/HDL RATIO: 2
Cholesterol: 135 mg/dL (ref 0–200)
HDL: 62.7 mg/dL (ref >39.00)
LDL CALC: 38 mg/dL (ref 0–99)
NONHDL: 72.42
Triglycerides: 170 mg/dL — ABNORMAL HIGH (ref 0.0–149.0)
VLDL: 34 mg/dL (ref 0.0–40.0)

## 2017-09-06 LAB — CBC WITH DIFFERENTIAL/PLATELET
BASOS ABS: 0.1 10*3/uL (ref 0.0–0.1)
Basophils Relative: 0.8 % (ref 0.0–3.0)
EOS ABS: 0.4 10*3/uL (ref 0.0–0.7)
Eosinophils Relative: 4 % (ref 0.0–5.0)
HEMATOCRIT: 37.2 % — AB (ref 39.0–52.0)
HEMOGLOBIN: 12.3 g/dL — AB (ref 13.0–17.0)
LYMPHS PCT: 30.5 % (ref 12.0–46.0)
Lymphs Abs: 2.9 10*3/uL (ref 0.7–4.0)
MCHC: 33.1 g/dL (ref 30.0–36.0)
MCV: 103 fl — ABNORMAL HIGH (ref 78.0–100.0)
Monocytes Absolute: 0.7 10*3/uL (ref 0.1–1.0)
Monocytes Relative: 7.2 % (ref 3.0–12.0)
NEUTROS ABS: 5.4 10*3/uL (ref 1.4–7.7)
Neutrophils Relative %: 57.5 % (ref 43.0–77.0)
PLATELETS: 227 10*3/uL (ref 150.0–400.0)
RBC: 3.61 Mil/uL — ABNORMAL LOW (ref 4.22–5.81)
RDW: 13.3 % (ref 11.5–15.5)
WBC: 9.4 10*3/uL (ref 4.0–10.5)

## 2017-09-06 LAB — VITAMIN B12: VITAMIN B 12: 422 pg/mL (ref 211–911)

## 2017-09-06 LAB — TESTOSTERONE: TESTOSTERONE: 174.92 ng/dL — AB (ref 300.00–890.00)

## 2017-09-06 LAB — CORTISOL: CORTISOL PLASMA: 11.8 ug/dL

## 2017-09-06 LAB — PSA: PSA: 1.97 ng/mL (ref 0.10–4.00)

## 2017-09-06 LAB — VITAMIN D 25 HYDROXY (VIT D DEFICIENCY, FRACTURES): VITD: 23.2 ng/mL — ABNORMAL LOW (ref 30.00–100.00)

## 2017-09-06 LAB — TSH: TSH: 2.5 u[IU]/mL (ref 0.35–4.50)

## 2017-09-06 NOTE — Progress Notes (Signed)
Subjective:    Patient ID: Philip Kerr, male    DOB: [birth year], 84 y.o.   MRN: [id number]  HPI   Here for wellness and f/u;  Overall doing ok;  Pt denies Chest pain, worsening SOB, DOE, wheezing, orthopnea, PND, worsening LE edema, palpitations, dizziness or syncope.  Pt denies neurological change such as new headache, facial or extremity weakness.  Pt denies polydipsia, polyuria, or low sugar symptoms. Pt states overall good compliance with treatment and medications, good tolerability, and has been trying to follow appropriate diet.  Pt denies worsening depressive symptoms, suicidal ideation or panic. No fever, night sweats, wt loss, loss of appetite, or other constitutional symptoms.  Pt states good ability with ADL's, has low fall risk, home safety reviewed and adequate, no other significant changes in hearing or vision, and only occasionally active with exercise. Also c/o marked fatigue, and persistent non prod cough for several weeks, without fever.  Has hx of abnormal Echo 10/2013 with EF 35-40%  Past Medical History:  Diagnosis Date  . Coronary artery disease    coronary artery bypass graft x 5 in 1996  . Diabetes mellitus   . Hypercholesterolemia    non-insulin dependent  . Hypertension    Past Surgical History:  Procedure Laterality Date  . CORONARY ARTERY BYPASS GRAFT    . LUNG SURGERY    . OTHER SURGICAL HISTORY     percutaneous coronary intervention of the  posterolateral segment on 01/27/1998    reports that he has never smoked. He has never used smokeless tobacco. He reports that he does not drink alcohol or use drugs. family history is not on file. No Known Allergies Current Outpatient Medications on File Prior to Visit  Medication Sig Dispense Refill  . aspirin 81 MG tablet Take 81 mg by mouth daily.    Marland Kitchen KLOR-CON M20 20 MEQ tablet TAKE 1/2 TABLET BY MOUTH EVERY DAY 45 tablet 2  . losartan (COZAAR) 50 MG tablet TAKE 1 TABLET BY MOUTH EVERY DAY 90 tablet 2  .  metoprolol tartrate (LOPRESSOR) 50 MG tablet TAKE 1/2 TABLET BY MOUTH TWICE A DAY 90 tablet 3  . pramipexole (MIRAPEX) 0.25 MG tablet Take 1 tablet (0.25 mg total) by mouth at bedtime. 90 tablet 3  . predniSONE (DELTASONE) 10 MG tablet 2 tabs by mouth per day for 5 days 10 tablet 0  . rosuvastatin (CRESTOR) 20 MG tablet TAKE 1 TABLET BY MOUTH EVERY DAY 90 tablet 3  . traZODone (DESYREL) 50 MG tablet Take 0.5-1 tablets (25-50 mg total) by mouth at bedtime as needed for sleep. 90 tablet 1  . triamcinolone cream (KENALOG) 0.1 % Apply 1 application topically 2 (two) times daily as needed. 30 g 2  . vitamin C (ASCORBIC ACID) 500 MG tablet Take 500 mg by mouth daily.    . furosemide (LASIX) 20 MG tablet Take 1 tablet (20 mg total) by mouth daily as needed (for swelling). 20 tablet 11   No current facility-administered medications on file prior to visit.    Review of Systems  Constitutional: Negative for other unusual diaphoresis or sweats HENT: Negative for ear discharge or swelling Eyes: Negative for other worsening visual disturbances Respiratory: Negative for stridor or other swelling  Gastrointestinal: Negative for worsening distension or other blood Genitourinary: Negative for retention or other urinary change Musculoskeletal: Negative for other MSK pain or swelling Skin: Negative for color change or other new lesions Neurological: Negative for worsening tremors and other numbness  Psychiatric/Behavioral:  Negative for worsening agitation or other fatigue All other system neg per pt    Objective:   Physical Exam BP 140/76   Pulse (!) 53   Temp 97.6 F (36.4 C) (Oral)   Ht 5\' 10"  (1.778 m)   Wt 172 lb (78 kg)   SpO2 98%   BMI 24.68 kg/m  VS noted,  Constitutional: Pt appears in NAD HENT: Head: NCAT.  Right Ear: External ear normal.  Left Ear: External ear normal.  Eyes: . Pupils are equal, round, and reactive to light. Conjunctivae and EOM are normal Nose: without d/c or  deformity Neck: Neck supple. Gross normal ROM Cardiovascular: Normal rate and regular rhythm.   Pulmonary/Chest: Effort normal and breath sounds without rales or wheezing.  Abd:  Soft, NT, ND, + BS, no organomegaly Neurological: Pt is alert. At baseline orientation, motor grossly intact Skin: Skin is warm. No rashes, other new lesions, no LE edema Psychiatric: Pt behavior is normal without agitation  No other exam findings    Assessment & Plan:

## 2017-09-06 NOTE — Telephone Encounter (Signed)
Pt has been informed of results and expressed understanding.  °

## 2017-09-06 NOTE — Telephone Encounter (Signed)
-----   Message from Biagio Borg, MD sent at 09/06/2017 12:42 PM EDT ----- Left message on MyChart, pt to cont same tx except  The test results show that your current treatment is OK, except the Vitamin D is low, the potassium is mildly high, the testosterone is mildly low,and there may be a very mild anemia.       As far as management of all of this, I will send prescription for Vit D to the pharmacy at 50,000 units per wk for 12 weeks, then switch to OTC Vit D3 at 2000 units per day (indefinitely).  We need to STOP the potassium, and recheck the potassium blood test in 1 week.  We can hold on treating the low testosterone for now to give the other issues a chance to be worked on, and we will also addon an iron level to your labs that you have already have drawn. Redmond Baseman to please inform pt, I will do rx for Vit D, pt to stop the potassium and I will add the order for the f/u BMP in 1 week, and shirron to add Iron Panel - anemia

## 2017-09-06 NOTE — Patient Instructions (Addendum)
You had the flu shot and the tetanus shot today  Please continue all other medications as before, and refills have been done if requested.  Please have the pharmacy call with any other refills you may need.  Please continue your efforts at being more active, low cholesterol diet, and weight control.  You are otherwise up to date with prevention measures today.  Please keep your appointments with your specialists as you may have planned  You will be contacted regarding the referral for: Echocardiogram  Please go to the XRAY Department in the Basement (go straight as you get off the elevator) for the x-ray testing  Please go to the LAB in the Basement (turn left off the elevator) for the tests to be done today  You will be contacted by phone if any changes need to be made immediately.  Otherwise, you will receive a letter about your results with an explanation, but please check with MyChart first.  Please remember to sign up for MyChart if you have not done so, as this will be important to you in the future with finding out test results, communicating by private email, and scheduling acute appointments online when needed.  Please return in 6 months, or sooner if needed

## 2017-09-07 ENCOUNTER — Encounter: Payer: Self-pay | Admitting: Internal Medicine

## 2017-09-07 NOTE — Assessment & Plan Note (Signed)
Lab Results  Component Value Date   LDLCALC 38 09/06/2017  stable overall by history and exam, recent data reviewed with pt, and pt to continue medical treatment as before,  to f/u any worsening symptoms or concerns

## 2017-09-07 NOTE — Assessment & Plan Note (Signed)
stable overall by history and exam, recent data reviewed with pt, and pt to continue medical treatment as before,  to f/u any worsening symptoms or concerns BP Readings from Last 3 Encounters:  09/06/17 140/76  08/27/17 116/62  08/23/17 (!) 120/42

## 2017-09-07 NOTE — Assessment & Plan Note (Signed)
Lab Results  Component Value Date   HGBA1C 6.8 (H) 09/06/2017  stable overall by history and exam, recent data reviewed with pt, and pt to continue medical treatment as before,  to f/u any worsening symptoms or concerns

## 2017-09-07 NOTE — Assessment & Plan Note (Signed)
Etiology unclear, for cortisol and testosterone with labs

## 2017-09-07 NOTE — Assessment & Plan Note (Signed)
No recent pain, but fatigue without obvious cause, denies depression, for repeat echo

## 2017-09-07 NOTE — Assessment & Plan Note (Signed)

## 2017-09-07 NOTE — Assessment & Plan Note (Addendum)
Etiology unclear, for cxr   In addition to the time spent performing CPE, I spent an additional 25 minutes face to face,in which greater than 50% of this time was spent in counseling and coordination of care for patient's acute illness as documented, including the differential dx, treatment, further evaluation and other management of cough, cardiomyopathy, hyperglycemia, HLD, HTN, fatigue

## 2017-09-10 NOTE — Progress Notes (Signed)
Chief Complaint  Patient presents with  . Follow-up    CAD   History of Present Illness: 84 yo male with history of CAD s/p 5V CABG 1996, ischemic cardiomyopathy, HTN, HLD, DM and PVCs here today for cardiac follow up. He has been followed in the past by Dr. Lia Foyer. Echo October 2015 with LVEF=35=40%, moderate LVH, trivial AI, moderate MR. Stress myoview May 2014 without ischemia, lateral scar. Cardiac monitor 2015 with PVCs. He did not tolerate higher doses of beta blockers. He has been reluctant to consider cardiac testing and has refused to consider an ICD. He was seen in March 2019 by Truitt Merle, NP and described a syncopal event at home. He refused any workup. He was seen in primary care last week by Dr. Jenny Reichmann. He was c/o fatigue. Echo ordered for today by Dr. Jenny Reichmann. Potassium stopped due to hyperkalemia.   He is here today for follow up. The patient denies any chest pain, dyspnea, palpitations, lower extremity edema, orthopnea, PND, dizziness, near syncope or syncope. He does feel more fatigued in the sun and heat.    Primary Care Physician: Biagio Borg, MD  Past Medical History:  Diagnosis Date  . Coronary artery disease    coronary artery bypass graft x 5 in 1996  . Diabetes mellitus   . Hypercholesterolemia    non-insulin dependent  . Hypertension     Past Surgical History:  Procedure Laterality Date  . CORONARY ARTERY BYPASS GRAFT    . LUNG SURGERY    . OTHER SURGICAL HISTORY     percutaneous coronary intervention of the  posterolateral segment on 01/27/1998    Current Outpatient Medications  Medication Sig Dispense Refill  . aspirin 81 MG tablet Take 81 mg by mouth daily.    Marland Kitchen losartan (COZAAR) 50 MG tablet TAKE 1 TABLET BY MOUTH EVERY DAY 90 tablet 2  . metoprolol tartrate (LOPRESSOR) 50 MG tablet TAKE 1/2 TABLET BY MOUTH TWICE A DAY 90 tablet 3  . pramipexole (MIRAPEX) 0.25 MG tablet Take 1 tablet (0.25 mg total) by mouth at bedtime. 90 tablet 3  .  rosuvastatin (CRESTOR) 20 MG tablet TAKE 1 TABLET BY MOUTH EVERY DAY 90 tablet 3  . traZODone (DESYREL) 50 MG tablet Take 0.5-1 tablets (25-50 mg total) by mouth at bedtime as needed for sleep. 90 tablet 1  . triamcinolone cream (KENALOG) 0.1 % Apply 1 application topically 2 (two) times daily as needed. 30 g 2  . vitamin C (ASCORBIC ACID) 500 MG tablet Take 500 mg by mouth daily.    . furosemide (LASIX) 20 MG tablet Take 1 tablet (20 mg total) by mouth daily as needed (for swelling). 20 tablet 11   No current facility-administered medications for this visit.     No Known Allergies  Social History   Socioeconomic History  . Marital status: Married    Spouse name: Not on file  . Number of children: 1  . Years of education: Not on file  . Highest education level: Not on file  Occupational History  . Occupation: Runs a Acupuncturist: SELF-EMPLOYED  Social Needs  . Financial resource strain: Not on file  . Food insecurity:    Worry: Never true    Inability: Never true  . Transportation needs:    Medical: No    Non-medical: No  Tobacco Use  . Smoking status: Never Smoker  . Smokeless tobacco: Never Used  Substance and Sexual Activity  .  Alcohol use: No  . Drug use: No  . Sexual activity: Not Currently  Lifestyle  . Physical activity:    Days per week: 3 days    Minutes per session: 30 min  . Stress: Only a little  Relationships  . Social connections:    Talks on phone: Not on file    Gets together: Not on file    Attends religious service: Not on file    Active member of club or organization: Not on file    Attends meetings of clubs or organizations: Not on file    Relationship status: Not on file  . Intimate partner violence:    Fear of current or ex partner: Not on file    Emotionally abused: Not on file    Physically abused: Not on file    Forced sexual activity: Not on file  Other Topics Concern  . Not on file  Social History Narrative  . Not on  file    Family History  Problem Relation Age of Onset  . CAD Neg Hx     Review of Systems:  As stated in the HPI and otherwise negative.   BP 136/60   Pulse 67   Ht 5\' 10"  (1.778 m)   Wt 170 lb 6.4 oz (77.3 kg)   SpO2 97%   BMI 24.45 kg/m   Physical Examination:  General: Well developed, well nourished, NAD  HEENT: OP clear, mucus membranes moist  SKIN: warm, dry. No rashes. Neuro: No focal deficits  Musculoskeletal: Muscle strength 5/5 all ext  Psychiatric: Mood and affect normal  Neck: No JVD, no carotid bruits, no thyromegaly, no lymphadenopathy.  Lungs:Clear bilaterally, no wheezes, rhonci, crackles Cardiovascular: Regular rate and rhythm. No murmurs, gallops or rubs. Abdomen:Soft. Bowel sounds present. Non-tender.  Extremities: No lower extremity edema. Pulses are 2 + in the bilateral DP/PT.  Echo October 2015: Left ventricle: There is apical septal dyskinesis due to paradoxical septal motion from BBB. The cavity size was normal. There was moderate concentric hypertrophy. Systolic function was moderately reduced. The estimated ejection fraction was in the range of 35% to 40%. There is akinesis of the basalinferior myocardium. There is severe hypokinesis of the apicalinferior myocardium. There is hypokinesis of the entireinferolateral myocardium. - Ventricular septum: Septal motion showed paradox. - Aortic valve: There was trivial regurgitation. - Aorta: Ascending aortic diameter: 41 mm (S). - Ascending aorta: The ascending aorta was mildly dilated. - Mitral valve: There was moderate regurgitation. - Left atrium: The atrium was moderately dilated. - Tricuspid valve: There was mild regurgitation.  EKG:  EKG is rdered today. The ekg ordered today demonstrates Sinus, rate 67 bpm. PVCs. LVH  Recent Labs: 09/06/2017: ALT 16; BUN 47; Creatinine, Ser 1.18; Hemoglobin 12.3; Platelets 227.0; Potassium 6.0 No hemolysis seen..; Sodium 136; TSH 2.50   Lipid  Panel    Component Value Date/Time   CHOL 135 09/06/2017 1024   TRIG 170.0 (H) 09/06/2017 1024   TRIG 102 10/22/2005 0730   HDL 62.70 09/06/2017 1024   CHOLHDL 2 09/06/2017 1024   VLDL 34.0 09/06/2017 1024   LDLCALC 38 09/06/2017 1024     Wt Readings from Last 3 Encounters:  09/11/17 170 lb 6.4 oz (77.3 kg)  09/06/17 172 lb (78 kg)  08/27/17 167 lb (75.8 kg)     Other studies Reviewed: Additional studies/ records that were reviewed today include: . Review of the above records demonstrates:   Assessment and Plan:   1. CAD without angina: No  chest pain. LVEF dropped on echo in 2015 but he and Dr. Lia Foyer discussed and he refused cath. Continue ASA, beta blocker and statin.   2. Cardiomyopathy, ischemic: LVEF=35-40% by echo 2015. Will continue Cozaar and Lopressor. Repeat echo is planned.   3. Hyperlipidemia: Lipids controlled. Continue statin.    4. LBBB: Chronic.   5. HTN: BP is controlled. No changes  6. Chronic systolic CHF: Volume status is ok. He has occasional lower ext edema. Continue to use Lasix as needed.   Current medicines are reviewed at length with the patient today.  The patient does not have concerns regarding medicines.  The following changes have been made:  no change  Labs/ tests ordered today include:   Orders Placed This Encounter  Procedures  . EKG 12-Lead     Disposition:   FU with me in 12 months   Signed, Lauree Chandler, MD 09/11/2017 8:49 AM    [facility] [place], [place],   [address] Phone: [phone]; Fax: [phone]

## 2017-09-10 NOTE — Telephone Encounter (Signed)
I have went over results with pt again and explained to him what each med prescribed by PCP was for.

## 2017-09-10 NOTE — Telephone Encounter (Signed)
Can you please call pt regarding his medication.

## 2017-09-11 ENCOUNTER — Ambulatory Visit (HOSPITAL_COMMUNITY): Payer: Medicare HMO | Attending: Cardiovascular Disease

## 2017-09-11 ENCOUNTER — Other Ambulatory Visit: Payer: Self-pay

## 2017-09-11 ENCOUNTER — Encounter: Payer: Self-pay | Admitting: Cardiovascular Disease

## 2017-09-11 ENCOUNTER — Ambulatory Visit: Payer: Medicare HMO | Admitting: Cardiovascular Disease

## 2017-09-11 VITALS — BP 136/60 | HR 67 | Ht 70.0 in | Wt 170.4 lb

## 2017-09-11 DIAGNOSIS — E119 Type 2 diabetes mellitus without complications: Secondary | ICD-10-CM | POA: Diagnosis not present

## 2017-09-11 DIAGNOSIS — I2510 Atherosclerotic heart disease of native coronary artery without angina pectoris: Secondary | ICD-10-CM | POA: Diagnosis not present

## 2017-09-11 DIAGNOSIS — I447 Left bundle-branch block, unspecified: Secondary | ICD-10-CM

## 2017-09-11 DIAGNOSIS — I255 Ischemic cardiomyopathy: Secondary | ICD-10-CM | POA: Diagnosis not present

## 2017-09-11 DIAGNOSIS — I080 Rheumatic disorders of both mitral and aortic valves: Secondary | ICD-10-CM | POA: Insufficient documentation

## 2017-09-11 DIAGNOSIS — Z951 Presence of aortocoronary bypass graft: Secondary | ICD-10-CM | POA: Insufficient documentation

## 2017-09-11 DIAGNOSIS — I5022 Chronic systolic (congestive) heart failure: Secondary | ICD-10-CM | POA: Diagnosis not present

## 2017-09-11 DIAGNOSIS — I119 Hypertensive heart disease without heart failure: Secondary | ICD-10-CM | POA: Insufficient documentation

## 2017-09-11 DIAGNOSIS — E785 Hyperlipidemia, unspecified: Secondary | ICD-10-CM | POA: Diagnosis not present

## 2017-09-11 DIAGNOSIS — I10 Essential (primary) hypertension: Secondary | ICD-10-CM | POA: Diagnosis not present

## 2017-09-11 DIAGNOSIS — E7800 Pure hypercholesterolemia, unspecified: Secondary | ICD-10-CM | POA: Diagnosis not present

## 2017-09-11 LAB — ECHOCARDIOGRAM COMPLETE
Height: 70 in
WEIGHTICAEL: 2726.4 [oz_av]

## 2017-09-11 NOTE — Patient Instructions (Signed)
Medication Instructions:  Your physician has recommended you make the following change in your medication: Stop potassium    Labwork: none  Testing/Procedures: none  Follow-Up: Your physician wants you to follow-up in: 12 months.  You will receive a reminder letter in the mail two months in advance. If you don't receive a letter, please call our office to schedule the follow-up appointment.   Any Other Special Instructions Will Be Listed Below (If Applicable).     If you need a refill on your cardiac medications before your next appointment, please call your pharmacy.

## 2017-09-12 NOTE — Telephone Encounter (Signed)
Patient called to ask doctor or nurse what was the medication that the doctor wanted him to take from his last visit on 09/06/17.  Patient stated that he doesn't know what it is and no one has called to confirm.  Please advise.  CB# [phone].

## 2017-09-13 NOTE — Telephone Encounter (Signed)
Dr. Jenny Reichmann please advise on xray results.

## 2017-09-13 NOTE — Telephone Encounter (Signed)
Philip Kerr 09/13/2017 11:47 AM    Pt said he is still waiting on the results from his Chest xray done 09/06/17

## 2017-09-13 NOTE — Telephone Encounter (Deleted)
Copied from [place] [id number]. Topic: General - Other >> Sep 12, 2017  1:23 PM Keene Breath wrote: Reason for CRM: Patient called to ask doctor or nurse what was the medication that the doctor wanted him to take from his last visit on 09/06/17.  Patient stated that he doesn't know what it is and no one has called to confirm.  Please advise.  CB# [phone]. >> Sep 12, 2017  1:30 PM Morey Hummingbird wrote: Added to telephone encounter  >> Sep 13, 2017 11:47 AM Carolyn Stare wrote:   Pt said he is still waiting on the results from his Chest xray done 09/06/17

## 2017-09-13 NOTE — Telephone Encounter (Signed)
Ok to resend aug 30 letter - Done hardcopy to [employer]

## 2017-09-16 ENCOUNTER — Telehealth: Payer: Self-pay | Admitting: Internal Medicine

## 2017-09-16 ENCOUNTER — Other Ambulatory Visit (INDEPENDENT_AMBULATORY_CARE_PROVIDER_SITE_OTHER): Payer: Medicare HMO

## 2017-09-16 DIAGNOSIS — D649 Anemia, unspecified: Secondary | ICD-10-CM

## 2017-09-16 LAB — COMPREHENSIVE METABOLIC PANEL
ALBUMIN: 3.6 g/dL (ref 3.5–5.2)
ALT: 28 U/L (ref 0–53)
AST: 25 U/L (ref 0–37)
Alkaline Phosphatase: 61 U/L (ref 39–117)
BUN: 35 mg/dL — AB (ref 6–23)
CHLORIDE: 102 meq/L (ref 96–112)
CO2: 27 meq/L (ref 19–32)
CREATININE: 1.23 mg/dL (ref 0.40–1.50)
Calcium: 8.9 mg/dL (ref 8.4–10.5)
GFR: 59.88 mL/min — ABNORMAL LOW (ref >60.00)
GLUCOSE: 151 mg/dL — AB (ref 70–99)
POTASSIUM: 5.3 meq/L — AB (ref 3.5–5.1)
SODIUM: 133 meq/L — AB (ref 135–145)
Total Bilirubin: 0.3 mg/dL (ref 0.2–1.2)
Total Protein: 6.8 g/dL (ref 6.0–8.3)

## 2017-09-16 LAB — IBC PANEL
IRON: 91 ug/dL (ref 42–165)
SATURATION RATIOS: 26.6 % (ref 20.0–50.0)
Transferrin: 244 mg/dL (ref 212.0–360.0)

## 2017-09-16 NOTE — Telephone Encounter (Signed)
Copied from [place] [id number]. Topic: General - Other >> Sep 16, 2017  7:55 AM Conception Chancy, NT wrote: Reason for CRM: patient wife is calling and states that the patient was instructed to get labs done in 1 week from visit on 09/06/17. Patient was taken off of potassium and was told to come back and get labs redrawn. I do not see any lab orders placed. Please contact patient once orders are in.

## 2017-09-16 NOTE — Telephone Encounter (Signed)
Thanks for catching that  Labs are ordered, we may need to contact the lab to make sure they know the iron panel is an addon

## 2017-09-16 NOTE — Telephone Encounter (Signed)
Please add iron panel and I will contact pt to come in and have labs redrawn

## 2017-09-16 NOTE — Telephone Encounter (Signed)
Spoke with pt to inform.  

## 2017-09-17 ENCOUNTER — Encounter: Payer: Self-pay | Admitting: Internal Medicine

## 2017-09-17 NOTE — Telephone Encounter (Signed)
Spoke to wife and wife states that the patient has no energy and is having dizzy spells.  Feels he needs the potassium again and testosterone.  Appt made for patient to come in tomorrow to discuss meds

## 2017-09-17 NOTE — Telephone Encounter (Signed)
Patients wife calling because no testosterone was sent to the pharmacy. [facility] in [place] is the one he uses.

## 2017-09-18 ENCOUNTER — Ambulatory Visit (INDEPENDENT_AMBULATORY_CARE_PROVIDER_SITE_OTHER): Payer: Medicare HMO | Admitting: Internal Medicine

## 2017-09-18 ENCOUNTER — Encounter: Payer: Self-pay | Admitting: Internal Medicine

## 2017-09-18 ENCOUNTER — Other Ambulatory Visit (INDEPENDENT_AMBULATORY_CARE_PROVIDER_SITE_OTHER): Payer: Medicare HMO

## 2017-09-18 VITALS — BP 134/78 | HR 51 | Temp 97.7°F | Ht 70.0 in | Wt 168.0 lb

## 2017-09-18 DIAGNOSIS — E875 Hyperkalemia: Secondary | ICD-10-CM | POA: Insufficient documentation

## 2017-09-18 DIAGNOSIS — F32A Depression, unspecified: Secondary | ICD-10-CM | POA: Insufficient documentation

## 2017-09-18 DIAGNOSIS — I10 Essential (primary) hypertension: Secondary | ICD-10-CM

## 2017-09-18 DIAGNOSIS — R39198 Other difficulties with micturition: Secondary | ICD-10-CM | POA: Diagnosis not present

## 2017-09-18 DIAGNOSIS — R42 Dizziness and giddiness: Secondary | ICD-10-CM | POA: Insufficient documentation

## 2017-09-18 DIAGNOSIS — R5383 Other fatigue: Secondary | ICD-10-CM | POA: Diagnosis not present

## 2017-09-18 DIAGNOSIS — R413 Other amnesia: Secondary | ICD-10-CM | POA: Diagnosis not present

## 2017-09-18 DIAGNOSIS — D649 Anemia, unspecified: Secondary | ICD-10-CM

## 2017-09-18 DIAGNOSIS — R634 Abnormal weight loss: Secondary | ICD-10-CM

## 2017-09-18 DIAGNOSIS — E119 Type 2 diabetes mellitus without complications: Secondary | ICD-10-CM | POA: Diagnosis not present

## 2017-09-18 DIAGNOSIS — F329 Major depressive disorder, single episode, unspecified: Secondary | ICD-10-CM | POA: Diagnosis not present

## 2017-09-18 DIAGNOSIS — F0390 Unspecified dementia without behavioral disturbance: Secondary | ICD-10-CM | POA: Insufficient documentation

## 2017-09-18 DIAGNOSIS — R300 Dysuria: Secondary | ICD-10-CM

## 2017-09-18 DIAGNOSIS — E559 Vitamin D deficiency, unspecified: Secondary | ICD-10-CM | POA: Insufficient documentation

## 2017-09-18 DIAGNOSIS — E871 Hypo-osmolality and hyponatremia: Secondary | ICD-10-CM

## 2017-09-18 DIAGNOSIS — R7989 Other specified abnormal findings of blood chemistry: Secondary | ICD-10-CM | POA: Diagnosis not present

## 2017-09-18 DIAGNOSIS — F03918 Unspecified dementia, unspecified severity, with other behavioral disturbance: Secondary | ICD-10-CM | POA: Insufficient documentation

## 2017-09-18 DIAGNOSIS — F0391 Unspecified dementia with behavioral disturbance: Secondary | ICD-10-CM | POA: Insufficient documentation

## 2017-09-18 LAB — CBC WITH DIFFERENTIAL/PLATELET
Basophils Absolute: 0.1 10*3/uL (ref 0.0–0.1)
Basophils Relative: 1.3 % (ref 0.0–3.0)
Eosinophils Absolute: 0.5 10*3/uL (ref 0.0–0.7)
Eosinophils Relative: 4.7 % (ref 0.0–5.0)
HCT: 38.9 % — ABNORMAL LOW (ref 39.0–52.0)
Hemoglobin: 13.1 g/dL (ref 13.0–17.0)
LYMPHS ABS: 3.2 10*3/uL (ref 0.7–4.0)
Lymphocytes Relative: 32.3 % (ref 12.0–46.0)
MCHC: 33.7 g/dL (ref 30.0–36.0)
MCV: 101.3 fl — AB (ref 78.0–100.0)
MONOS PCT: 6.2 % (ref 3.0–12.0)
Monocytes Absolute: 0.6 10*3/uL (ref 0.1–1.0)
NEUTROS PCT: 55.5 % (ref 43.0–77.0)
Neutro Abs: 5.4 10*3/uL (ref 1.4–7.7)
PLATELETS: 210 10*3/uL (ref 150.0–400.0)
RBC: 3.84 Mil/uL — ABNORMAL LOW (ref 4.22–5.81)
RDW: 13.3 % (ref 11.5–15.5)
WBC: 9.8 10*3/uL (ref 4.0–10.5)

## 2017-09-18 LAB — URINALYSIS, ROUTINE W REFLEX MICROSCOPIC
Bilirubin Urine: NEGATIVE
HGB URINE DIPSTICK: NEGATIVE
Ketones, ur: NEGATIVE
Leukocytes, UA: NEGATIVE
Nitrite: NEGATIVE
SPECIFIC GRAVITY, URINE: 1.01 (ref 1.000–1.030)
TOTAL PROTEIN, URINE-UPE24: NEGATIVE
URINE GLUCOSE: NEGATIVE
Urobilinogen, UA: 0.2 (ref 0.0–1.0)
pH: 5.5 (ref 5.0–8.0)

## 2017-09-18 LAB — BASIC METABOLIC PANEL
BUN: 40 mg/dL — ABNORMAL HIGH (ref 6–23)
CALCIUM: 8.9 mg/dL (ref 8.4–10.5)
CO2: 26 mEq/L (ref 19–32)
Chloride: 103 mEq/L (ref 96–112)
Creatinine, Ser: 1.31 mg/dL (ref 0.40–1.50)
GFR: 55.68 mL/min — ABNORMAL LOW (ref >60.00)
GLUCOSE: 99 mg/dL (ref 70–99)
Potassium: 4.7 mEq/L (ref 3.5–5.1)
SODIUM: 136 meq/L (ref 135–145)

## 2017-09-18 MED ORDER — TAMSULOSIN HCL 0.4 MG PO CAPS: 0.4000 mg | ORAL_CAPSULE | Freq: Every day | ORAL | 3 refills | Status: DC

## 2017-09-18 MED ORDER — CITALOPRAM HYDROBROMIDE 10 MG PO TABS: 10.0000 mg | ORAL_TABLET | Freq: Every day | ORAL | 3 refills | Status: DC

## 2017-09-18 NOTE — Assessment & Plan Note (Signed)
Minor recent, for f/u today

## 2017-09-18 NOTE — Assessment & Plan Note (Signed)
To continue oral supplementation

## 2017-09-18 NOTE — Assessment & Plan Note (Signed)
Improved recently with stopping K, he keeps wanting to take this at home per wife but she has been able to control this, for f/u lab today

## 2017-09-18 NOTE — Assessment & Plan Note (Signed)
With recent mild trending down, iron and b12 ok, macrocytic indices with no hx of med relation or liver dz,  to f/u any worsening symptoms or concerns

## 2017-09-18 NOTE — Patient Instructions (Addendum)
Please take all new medication as prescribed - the flomax for the prostate, and celexa  You will be contacted regarding the referral for: Urology  Please call if you change your mind about other testing such as MRI head for the dizziness, and neurology referral, or CT scans for weight loss  Please continue all other medications as before, and refills have been done if requested.  Please have the pharmacy call with any other refills you may need.  Please keep your appointments with your specialists as you may have planned  Please go to the LAB in the Basement (turn left off the elevator) for the tests to be done today  You will be contacted by phone if any changes need to be made immediately.  Otherwise, you will receive a letter about your results with an explanation, but please check with MyChart first.  Please remember to sign up for MyChart if you have not done so, as this will be important to you in the future with finding out test results, communicating by private email, and scheduling acute appointments online when needed

## 2017-09-18 NOTE — Assessment & Plan Note (Signed)
Mild to mod it seems, for celexa 10 qd, declines referral counseling or psychiatry

## 2017-09-18 NOTE — Assessment & Plan Note (Signed)
Etiology unclear, Exam otherwise benign, to check labs as documented, follow with expectant management  

## 2017-09-18 NOTE — Assessment & Plan Note (Signed)
Etiology unclear, could be depression related, for SPEP, declines CT chest/abd/pelvis today

## 2017-09-18 NOTE — Assessment & Plan Note (Signed)
Would not attempt HRT at this time due to risk of worsening urinary symptoms

## 2017-09-18 NOTE — Assessment & Plan Note (Signed)
D/w pt who denies, wife agrees but pt declines MRI brain or neurology referral

## 2017-09-18 NOTE — Progress Notes (Signed)
Subjective:    Patient ID: Philip Kerr, male    DOB: 06-20-1933, 84 y.o.   MRN: [id number]  HPI  Here with wife for support for numerous problems in the setting of his anxiety and keeps pushing me to let him leave from the exam and mentions several times he "ready to go" (as in dying, "whats the use?);  Retired in July, now with worsening depression without SI or HI, and Denies worsening panic. Also C/o persistent mild urinary discomfort with slow flow on urination with some hesitancy and possible mild retention better with taking a walk around the house and trying to urinate again.  Has lost some wt for unclear reasons, appetite some less.  Also noted is mild new anemia with recent iron and b12 levels normal.  Taking the Vit D 50000/wk but does not feel any different.  Wt Readings from Last 3 Encounters:  09/18/17 168 lb (76.2 kg)  09/11/17 170 lb 6.4 oz (77.3 kg)  09/06/17 172 lb (78 kg)  Wife reports he is more dizzy and she drives him now, concerned about his dizziness and even recent memory worsening.   Past Medical History:  Diagnosis Date  . Coronary artery disease    coronary artery bypass graft x 5 in 1996  . Diabetes mellitus   . Hypercholesterolemia    non-insulin dependent  . Hypertension    Past Surgical History:  Procedure Laterality Date  . CORONARY ARTERY BYPASS GRAFT    . LUNG SURGERY    . OTHER SURGICAL HISTORY     percutaneous coronary intervention of the  posterolateral segment on 01/27/1998    reports that he has never smoked. He has never used smokeless tobacco. He reports that he does not drink alcohol or use drugs. family history is not on file. No Known Allergies Current Outpatient Medications on File Prior to Visit  Medication Sig Dispense Refill  . aspirin 81 MG tablet Take 81 mg by mouth daily.    Marland Kitchen losartan (COZAAR) 50 MG tablet TAKE 1 TABLET BY MOUTH EVERY DAY 90 tablet 2  . metoprolol tartrate (LOPRESSOR) 50 MG tablet TAKE 1/2 TABLET BY MOUTH  TWICE A DAY 90 tablet 3  . pramipexole (MIRAPEX) 0.25 MG tablet Take 1 tablet (0.25 mg total) by mouth at bedtime. 90 tablet 3  . rosuvastatin (CRESTOR) 20 MG tablet TAKE 1 TABLET BY MOUTH EVERY DAY 90 tablet 3  . traZODone (DESYREL) 50 MG tablet Take 0.5-1 tablets (25-50 mg total) by mouth at bedtime as needed for sleep. 90 tablet 1  . triamcinolone cream (KENALOG) 0.1 % Apply 1 application topically 2 (two) times daily as needed. 30 g 2  . vitamin C (ASCORBIC ACID) 500 MG tablet Take 500 mg by mouth daily.    . furosemide (LASIX) 20 MG tablet Take 1 tablet (20 mg total) by mouth daily as needed (for swelling). 20 tablet 11   No current facility-administered medications on file prior to visit.    Review of Systems  Constitutional: Negative for other unusual diaphoresis or sweats HENT: Negative for ear discharge or swelling Eyes: Negative for other worsening visual disturbances Respiratory: Negative for stridor or other swelling  Gastrointestinal: Negative for worsening distension or other blood Genitourinary: Negative for retention or other urinary change Musculoskeletal: Negative for other MSK pain or swelling Skin: Negative for color change or other new lesions Neurological: Negative for worsening tremors and other numbness  Psychiatric/Behavioral: Negative for worsening agitation or other fatigue All other system  neg per pt    Objective:   Physical Exam BP 134/78   Pulse (!) 51   Temp 97.7 F (36.5 C) (Oral)   Ht 5\' 10"  (1.778 m)   Wt 168 lb (76.2 kg)   SpO2 97%   BMI 24.11 kg/m  VS noted, not ill appearing Constitutional: Pt appears in NAD HENT: Head: NCAT.  Right Ear: External ear normal.  Left Ear: External ear normal.  Eyes: . Pupils are equal, round, and reactive to light. Conjunctivae and EOM are normal Nose: without d/c or deformity Neck: Neck supple. Gross normal ROM Cardiovascular: Normal rate and regular rhythm.   Pulmonary/Chest: Effort normal and breath  sounds without rales or wheezing.  Abd:  Soft, NT, ND, + BS, no organomegaly Neurological: Pt is alert. At baseline orientation but has some decreased cognitive ability he covers by making jokes, motor grossly intact Skin: Skin is warm. No rashes, other new lesions, no LE edema Psychiatric: Pt behavior is normal without agitation , depressed affect No other exam findings Lab Results  Component Value Date   WBC 9.4 09/06/2017   HGB 12.3 (L) 09/06/2017   HCT 37.2 (L) 09/06/2017   PLT 227.0 09/06/2017   GLUCOSE 151 (H) 09/16/2017   CHOL 135 09/06/2017   TRIG 170.0 (H) 09/06/2017   HDL 62.70 09/06/2017   LDLCALC 38 09/06/2017   ALT 28 09/16/2017   AST 25 09/16/2017   NA 133 (L) 09/16/2017   K 5.3 (H) 09/16/2017   CL 102 09/16/2017   CREATININE 1.23 09/16/2017   BUN 35 (H) 09/16/2017   CO2 27 09/16/2017   TSH 2.50 09/06/2017   PSA 1.97 09/06/2017   HGBA1C 6.8 (H) 09/06/2017      Assessment & Plan:

## 2017-09-18 NOTE — Assessment & Plan Note (Signed)
Etiology unclear, declines evaluation

## 2017-09-18 NOTE — Assessment & Plan Note (Signed)
stable overall by history and exam, recent data reviewed with pt, and pt to continue medical treatment as before,  to f/u any worsening symptoms or concerns  

## 2017-09-18 NOTE — Assessment & Plan Note (Signed)
Suspect BPH - for trial flomax, refer urology

## 2017-09-18 NOTE — Assessment & Plan Note (Addendum)
Also for f/u urine studies, doubt infection at this time  Note:  Total time for pt hx, exam, review of record with pt in the room, determination of diagnoses and plan for further eval and tx is > 40 min, with over 50% spent in coordination and counseling of patient including the differential dx, tx, further evaluation and other management of dysuria, slow urinary flow, dizzy, memory changes, anemia, DM, HTN, fatigue, low testosterone, depression, hyperkalemia, Vit D deficiency

## 2017-09-19 LAB — URINE CULTURE
MICRO NUMBER:: 91087939
Result:: NO GROWTH
SPECIMEN QUALITY: ADEQUATE

## 2017-11-07 ENCOUNTER — Other Ambulatory Visit: Payer: Self-pay | Admitting: Internal Medicine

## 2018-01-02 ENCOUNTER — Telehealth: Payer: Self-pay | Admitting: Internal Medicine

## 2018-01-02 NOTE — Telephone Encounter (Signed)
Patient was seen here on September 06, 2017. I don't see anything from 2 weeks ago. The patient needs to be seen.

## 2018-01-02 NOTE — Telephone Encounter (Signed)
Please schedule pt for an OV for a follow up. Thanks!

## 2018-01-02 NOTE — Telephone Encounter (Signed)
I have tried to call the only number that both patients have .  There is not VM set up.  I was unable to reach.

## 2018-01-02 NOTE — Telephone Encounter (Signed)
Copied from [place] [phone]. Topic: Quick Communication - See Telephone Encounter >> Jan 02, 2018 12:03 PM Bea Graff, NT wrote: CRM for notification. See Telephone encounter for: 01/02/18. Pts wife states her husband came for a chest xray a few weeks ago and that Dr. Jenny Reichmann stated his lungs are clear. She would like to have the x-ray doubled checked because she states "there are no way his lungs are clear." Please advise.

## 2018-01-03 NOTE — Telephone Encounter (Signed)
Noted  

## 2018-01-29 ENCOUNTER — Other Ambulatory Visit: Payer: Self-pay | Admitting: Internal Medicine

## 2018-02-18 ENCOUNTER — Ambulatory Visit (INDEPENDENT_AMBULATORY_CARE_PROVIDER_SITE_OTHER): Payer: Medicare HMO | Admitting: Internal Medicine

## 2018-02-18 ENCOUNTER — Other Ambulatory Visit (INDEPENDENT_AMBULATORY_CARE_PROVIDER_SITE_OTHER): Payer: Medicare HMO

## 2018-02-18 VITALS — BP 122/72 | HR 50 | Temp 97.7°F | Wt 171.0 lb

## 2018-02-18 DIAGNOSIS — R300 Dysuria: Secondary | ICD-10-CM

## 2018-02-18 DIAGNOSIS — R39198 Other difficulties with micturition: Secondary | ICD-10-CM | POA: Diagnosis not present

## 2018-02-18 DIAGNOSIS — F0390 Unspecified dementia without behavioral disturbance: Secondary | ICD-10-CM

## 2018-02-18 LAB — URINALYSIS, ROUTINE W REFLEX MICROSCOPIC
BILIRUBIN URINE: NEGATIVE
Hgb urine dipstick: NEGATIVE
Ketones, ur: NEGATIVE
Leukocytes,Ua: NEGATIVE
Nitrite: NEGATIVE
RBC / HPF: NONE SEEN (ref 0–?)
Specific Gravity, Urine: 1.01 (ref 1.000–1.030)
Urine Glucose: NEGATIVE
Urobilinogen, UA: 0.2 (ref 0.0–1.0)
pH: 5 (ref 5.0–8.0)

## 2018-02-18 MED ORDER — ALBUTEROL SULFATE HFA 108 (90 BASE) MCG/ACT IN AERS: 2.0000 | INHALATION_SPRAY | Freq: Four times a day (QID) | RESPIRATORY_TRACT | 11 refills | Status: DC | PRN

## 2018-02-18 MED ORDER — LEVOFLOXACIN 250 MG PO TABS: 250.0000 mg | ORAL_TABLET | Freq: Every day | ORAL | 0 refills | Status: DC

## 2018-02-18 MED ORDER — DOXAZOSIN MESYLATE 2 MG PO TABS: 2.0000 mg | ORAL_TABLET | Freq: Every day | ORAL | 3 refills | Status: DC

## 2018-02-18 NOTE — Assessment & Plan Note (Signed)
Ok for trial cardua 2 mg qhs, refer urology

## 2018-02-18 NOTE — Assessment & Plan Note (Signed)
Mild recent worsening, declines MRI or specific tx today

## 2018-02-18 NOTE — Patient Instructions (Addendum)
.  Please take all new medication as prescribed  - the antibiotic, and the inhaler as needed, and cardura at bedtime for the prostate  You will be contacted regarding the referral for: Urology  Please continue all other medications as before, and refills have been done if requested.  Please have the pharmacy call with any other refills you may need  Please keep your appointments with your specialists as you may have planned  Please go to the LAB in the Basement (turn left off the elevator) for the tests to be done today - just the urine testing today  You will be contacted by phone if any changes need to be made immediately.  Otherwise, you will receive a letter about your results with an explanation, but please check with MyChart first.  Please remember to sign up for MyChart if you have not done so, as this will be important to you in the future with finding out test results, communicating by private email, and scheduling acute appointments online when needed.

## 2018-02-18 NOTE — Assessment & Plan Note (Signed)
Etiology unclear, for urine studies, empiric levaquin asd

## 2018-02-18 NOTE — Progress Notes (Signed)
Subjective:    Patient ID: Philip Kerr, male    DOB: 1933-06-24, 85 y.o.   MRN: [id number]  HPI  Here to f/u with wife, having more memory loss and now mod to severe hearing (constantly says "Huh?" to each question, but is stubborn on hearing evaluation; he is difficult historian as well, but I gather he is c/o dysuria recurrent as per several times last yr, better with cipro course in early august, then better in sept with flomax.  Not taking the flomax due to perceived dizziness with this (though has chronic dizziness) and will not take further.  Did not see urology as referred before, but may be willing now.  Denies urinary symptoms such as urgency, flank pain, hematuria or n/v, fever, chills, but has some increased frequency as well as straining to start urination for several months.  Pt denies chest pain, but has incresased sob with nocturnal wheezing, but no orthopnea, PND, increased LE swelling, palpitations, dizziness or syncope.   Pt denies polydipsia, polyuria Past Medical History:  Diagnosis Date  . Coronary artery disease    coronary artery bypass graft x 5 in 1996  . Diabetes mellitus   . Hypercholesterolemia    non-insulin dependent  . Hypertension    Past Surgical History:  Procedure Laterality Date  . CORONARY ARTERY BYPASS GRAFT    . LUNG SURGERY    . OTHER SURGICAL HISTORY     percutaneous coronary intervention of the  posterolateral segment on 01/27/1998    reports that he has never smoked. He has never used smokeless tobacco. He reports that he does not drink alcohol or use drugs. family history is not on file. No Known Allergies Current Outpatient Medications on File Prior to Visit  Medication Sig Dispense Refill  . aspirin 81 MG tablet Take 81 mg by mouth daily.    . citalopram (CELEXA) 10 MG tablet Take 1 tablet (10 mg total) by mouth daily. 90 tablet 3  . losartan (COZAAR) 50 MG tablet TAKE 1 TABLET BY MOUTH EVERY DAY 90 tablet 2  . metoprolol tartrate  (LOPRESSOR) 50 MG tablet TAKE 1/2 TABLET BY MOUTH TWICE A DAY 90 tablet 3  . pramipexole (MIRAPEX) 0.25 MG tablet Take 1 tablet (0.25 mg total) by mouth at bedtime. 90 tablet 3  . rosuvastatin (CRESTOR) 20 MG tablet TAKE 1 TABLET BY MOUTH EVERY DAY 90 tablet 3  . tamsulosin (FLOMAX) 0.4 MG CAPS capsule TAKE 1 CAPSULE BY MOUTH EVERY DAY 90 capsule 1  . traZODone (DESYREL) 50 MG tablet Take 0.5-1 tablets (25-50 mg total) by mouth at bedtime as needed for sleep. 90 tablet 1  . triamcinolone cream (KENALOG) 0.1 % Apply 1 application topically 2 (two) times daily as needed. 30 g 2  . vitamin C (ASCORBIC ACID) 500 MG tablet Take 500 mg by mouth daily.    . furosemide (LASIX) 20 MG tablet Take 1 tablet (20 mg total) by mouth daily as needed (for swelling). 20 tablet 11   No current facility-administered medications on file prior to visit.    Review of Systems  Constitutional: Negative for other unusual diaphoresis or sweats HENT: Negative for ear discharge or swelling Eyes: Negative for other worsening visual disturbances Respiratory: Negative for stridor or other swelling  Gastrointestinal: Negative for worsening distension or other blood Genitourinary: Negative for retention or other urinary change Musculoskeletal: Negative for other MSK pain or swelling Skin: Negative for color change or other new lesions Neurological: Negative for worsening tremors  and other numbness  Psychiatric/Behavioral: Negative for worsening agitation or other fatigue All other system neg per pt    Objective:   Physical Exam BP 122/72   Pulse (!) 50   Temp 97.7 F (36.5 C) (Oral)   Wt 171 lb (77.6 kg)   SpO2 97%   BMI 24.54 kg/m  VS noted, non toxic Constitutional: Pt appears in NAD HENT: Head: NCAT.  Right Ear: External ear normal.  Left Ear: External ear normal.  Eyes: . Pupils are equal, round, and reactive to light. Conjunctivae and EOM are normal Nose: without d/c or deformity Neck: Neck supple. Gross  normal ROM Cardiovascular: Normal rate and regular rhythm.   Pulmonary/Chest: Effort normal and breath sounds without rales or wheezing.  Abd:  Soft, NT, ND, + BS, no organomegaly Neurological: Pt is alert. At baseline orientation, motor grossly intact Skin: Skin is warm. No rashes, other new lesions, no LE edema Psychiatric: Pt behavior is normal without agitation  No other exam findings Lab Results  Component Value Date   WBC 9.8 09/18/2017   HGB 13.1 09/18/2017   HCT 38.9 (L) 09/18/2017   PLT 210.0 09/18/2017   GLUCOSE 99 09/18/2017   CHOL 135 09/06/2017   TRIG 170.0 (H) 09/06/2017   HDL 62.70 09/06/2017   LDLCALC 38 09/06/2017   ALT 28 09/16/2017   AST 25 09/16/2017   NA 136 09/18/2017   K 4.7 09/18/2017   CL 103 09/18/2017   CREATININE 1.31 09/18/2017   BUN 40 (H) 09/18/2017   CO2 26 09/18/2017   TSH 2.50 09/06/2017   PSA 1.97 09/06/2017   HGBA1C 6.8 (H) 09/06/2017       Assessment & Plan:

## 2018-02-19 ENCOUNTER — Encounter: Payer: Self-pay | Admitting: Internal Medicine

## 2018-02-19 LAB — URINE CULTURE
MICRO NUMBER: 179498
Result:: NO GROWTH
SPECIMEN QUALITY:: ADEQUATE

## 2018-02-23 ENCOUNTER — Other Ambulatory Visit: Payer: Self-pay | Admitting: Cardiovascular Disease

## 2018-02-25 ENCOUNTER — Other Ambulatory Visit: Payer: Self-pay | Admitting: Internal Medicine

## 2018-02-25 MED ORDER — LOSARTAN POTASSIUM 50 MG PO TABS: 50.0000 mg | ORAL_TABLET | Freq: Every day | ORAL | 2 refills | Status: DC

## 2018-02-27 ENCOUNTER — Other Ambulatory Visit: Payer: Self-pay | Admitting: Cardiovascular Disease

## 2018-03-12 ENCOUNTER — Telehealth: Payer: Self-pay | Admitting: Cardiovascular Disease

## 2018-03-12 NOTE — Telephone Encounter (Signed)
Pt's pharmacy [facility] in [place], requesting a different medication for Losartan. Pharmacy stating that all strengths of Losartan are on backorder and would like the doctor to switch medication to something different. Please address

## 2018-03-13 MED ORDER — VALSARTAN 160 MG PO TABS: 160.0000 mg | ORAL_TABLET | Freq: Every day | ORAL | 3 refills | Status: DC

## 2018-03-13 NOTE — Telephone Encounter (Signed)
Reviewed with Eino Farber, PharmD who recommends pt change to Valsartan 160 mg daily. Monitor BP and call if it becomes elevated. I spoke with [facility] at [facility] and gave her medication change information.  I spoke with pt and made him aware of change. I asked him to monitor his BP and call if it starts running higher.

## 2018-04-06 ENCOUNTER — Other Ambulatory Visit: Payer: Self-pay | Admitting: Cardiovascular Disease

## 2018-04-11 ENCOUNTER — Encounter: Payer: Self-pay | Admitting: Internal Medicine

## 2018-04-18 ENCOUNTER — Other Ambulatory Visit: Payer: Self-pay | Admitting: Cardiovascular Disease

## 2018-04-20 NOTE — Telephone Encounter (Signed)
He had been on Cozaar 50 mg daily. If his pharmacy has this medication, I am ok changing it back to Cozaar. Thanks, chris

## 2018-04-21 ENCOUNTER — Telehealth: Payer: Self-pay | Admitting: Cardiovascular Disease

## 2018-04-21 NOTE — Telephone Encounter (Signed)
Spoke with patient's wife and confirmed understanding to stop valsartan and restart losartan 50 mg daily (see refill encounter 04/18/18 for further detail)

## 2018-04-21 NOTE — Telephone Encounter (Signed)
Left detailed message on home VM (per DPR). Adv that Dr. Angelena Form has approved the change back to losartan 50 mg since it is available at pharmacy now. Adv to make sure that he stops the valsartan 160 mg since starting back on losartan.  Adv to call office back if concerns or needs to discuss further.

## 2018-04-21 NOTE — Telephone Encounter (Signed)
New Message   Patient returning Michalene's call.

## 2018-08-24 ENCOUNTER — Other Ambulatory Visit: Payer: Self-pay | Admitting: Internal Medicine

## 2018-09-27 ENCOUNTER — Other Ambulatory Visit: Payer: Self-pay | Admitting: Cardiovascular Disease

## 2018-09-28 ENCOUNTER — Other Ambulatory Visit: Payer: Self-pay | Admitting: Internal Medicine

## 2018-11-04 ENCOUNTER — Ambulatory Visit: Payer: Medicare HMO | Admitting: Internal Medicine

## 2018-11-04 ENCOUNTER — Encounter: Payer: Self-pay | Admitting: Internal Medicine

## 2018-11-04 ENCOUNTER — Ambulatory Visit (INDEPENDENT_AMBULATORY_CARE_PROVIDER_SITE_OTHER): Payer: Medicare HMO | Admitting: Internal Medicine

## 2018-11-04 ENCOUNTER — Ambulatory Visit: Payer: Self-pay

## 2018-11-04 DIAGNOSIS — J069 Acute upper respiratory infection, unspecified: Secondary | ICD-10-CM | POA: Diagnosis not present

## 2018-11-04 DIAGNOSIS — Z20828 Contact with and (suspected) exposure to other viral communicable diseases: Secondary | ICD-10-CM | POA: Diagnosis not present

## 2018-11-04 MED ORDER — HYDROCODONE-HOMATROPINE 5-1.5 MG/5ML PO SYRP: 5.0000 mL | ORAL_SOLUTION | Freq: Four times a day (QID) | ORAL | 0 refills | Status: AC | PRN

## 2018-11-04 MED ORDER — AZITHROMYCIN 250 MG PO TABS: ORAL_TABLET | ORAL | 1 refills | Status: DC

## 2018-11-04 NOTE — Progress Notes (Signed)
Patient ID: Philip Kerr, male   DOB: [birth year], 85 y.o.   MRN: [id number]  Pt no show for virtual, and unable to reach by mobile or home phones

## 2018-11-04 NOTE — Patient Instructions (Signed)
Please take all new medication as prescribed - the antibiotic, and the cough medicine as needed  Please follow up on your COVID test results done at the [facility] today  Please continue all other medications as before, and refills have been done if requested.  Please have the pharmacy call with any other refills you may need.  Please continue your efforts at being more active, low cholesterol diet, and weight control.  Please keep your appointments with your specialists as you may have planned

## 2018-11-04 NOTE — Progress Notes (Signed)
Patient ID: Philip Kerr, male   DOB: 03-01-1933, 85 y.o.   MRN: [id number]  Phone visit:  Cumulative time during 7-day interval 12 min, there was not an associated office visit for this concern within a 7 day period.  Verbal consent for services obtained from patient prior to services given.  Names of all persons present for services: Cathlean Cower, MD, patient  Chief complaint: sinus pain  History, background, results pertinent:   Here with 2-3 days acute onset fever, facial pain, pressure, headache, general weakness and malaise, and greenish d/c, with mild ST and cough, but pt denies chest pain, wheezing, increased sob or doe, orthopnea, PND, increased LE swelling, palpitations, dizziness or syncope Past Medical History:  Diagnosis Date  . Coronary artery disease    coronary artery bypass graft x 5 in 1996  . Diabetes mellitus   . Hypercholesterolemia    non-insulin dependent  . Hypertension    No results found for this or any previous visit (from the past 48 hour(s)). Lab Results  Component Value Date   WBC 9.8 09/18/2017   HGB 13.1 09/18/2017   HCT 38.9 (L) 09/18/2017   PLT 210.0 09/18/2017   GLUCOSE 99 09/18/2017   CHOL 135 09/06/2017   TRIG 170.0 (H) 09/06/2017   HDL 62.70 09/06/2017   LDLCALC 38 09/06/2017   ALT 28 09/16/2017   AST 25 09/16/2017   NA 136 09/18/2017   K 4.7 09/18/2017   CL 103 09/18/2017   CREATININE 1.31 09/18/2017   BUN 40 (H) 09/18/2017   CO2 26 09/18/2017   TSH 2.50 09/06/2017   PSA 1.97 09/06/2017   HGBA1C 6.8 (H) 09/06/2017   A/P/next steps:   1)  Acute sinus infection - for zpack, cough med prn,  to f/u any worsening symptoms or concerns  Cathlean Cower MD

## 2018-11-04 NOTE — Telephone Encounter (Signed)
Patient called stating that he has a sore throat and nasal congestion. He denies fever.  He states that he know of no known exposure to COVID-19.  He has made himself an appointment to be tested later today. He has a cough but has had cough for years. He has had lung surgery. He denies breathing issues. Care advice read to patient.but patient call dropped. Returned call to patient and transferred call to office for appointment. He verbalized understanding of treatment plan. He will be tested and Dr will speak with him about his symptoms.  Reason for Disposition . [1] COVID-19 infection suspected by caller or triager AND [2] mild symptoms (cough, fever, or others) AND [id number] no complications or SOB  Answer Assessment - Initial Assessment Questions 1. COVID-19 DIAGNOSIS: "Who made your Coronavirus (COVID-19) diagnosis?" "Was it confirmed by a positive lab test?" If not diagnosed by a HCP, ask "Are there lots of cases (community spread) where you live?" (See public health department website, if unsure)     [place] 2. ONSET: "When did the COVID-19 symptoms start?"      2-3 days ago 3. WORST SYMPTOM: "What is your worst symptom?" (e.g., cough, fever, shortness of breath, muscle aches)    Sore throat, nasal congestion, 4. COUGH: "Do you have a cough?" If so, ask: "How bad is the cough?"       cough 5. FEVER: "Do you have a fever?" If so, ask: "What is your temperature, how was it measured, and when did it start?"    No 6. RESPIRATORY STATUS: "Describe your breathing?" (e.g., shortness of breath, wheezing, unable to speak)      none 7. BETTER-SAME-WORSE: "Are you getting better, staying the same or getting worse compared to yesterday?"  If getting worse, ask, "In what way?"    same 8. HIGH RISK DISEASE: "Do you have any chronic medical problems?" (e.g., asthma, heart or lung disease, weak immune system, etc.)    Lung surgery bronchial problems 9. PREGNANCY: "Is there any chance you are pregnant?"  "When was your last menstrual period?"    N/A 10. OTHER SYMPTOMS: "Do you have any other symptoms?"  (e.g., chills, fatigue, headache, loss of smell or taste, muscle pain, sore throat)     Sore throat, nothing else  Protocols used: CORONAVIRUS (COVID-19) DIAGNOSED OR SUSPECTED-A-AH

## 2018-11-04 NOTE — Patient Instructions (Signed)
none

## 2018-11-04 NOTE — Telephone Encounter (Signed)
Noted  

## 2018-11-05 ENCOUNTER — Encounter: Payer: Self-pay | Admitting: Internal Medicine

## 2018-11-05 NOTE — Assessment & Plan Note (Signed)
Mild to mod, for antibx course,  to f/u any worsening symptoms or concerns 

## 2018-11-16 ENCOUNTER — Encounter: Payer: Self-pay | Admitting: Cardiovascular Disease

## 2018-11-16 NOTE — Progress Notes (Signed)
Chief Complaint  Patient presents with  . Follow-up    CAD   History of Present Illness: 85 yo male with history of CAD s/p 5V CABG 1996, ischemic cardiomyopathy, HTN, HLD, DM, mitral regurgitation, aortic valve insufficiency and PVCs here today for cardiac follow up. He has been followed in the past by Dr. Lia Foyer. Echo October 2015 with LVEF=35=40%, moderate LVH, trivial AI, moderate MR. Stress myoview May 2014 without ischemia, lateral scar. Cardiac monitor 2015 with PVCs. He did not tolerate higher doses of beta blockers. He has been reluctant to consider cardiac testing and has refused to consider an ICD. He was seen in March 2019 by Truitt Merle, NP and described a syncopal event at home. He refused any workup. Echo September 2019 with LVEF=25-30%, severe LVH. Mild AI, moderate MR. He had a left lobectomy as a teenager.   He is here today for follow up. The patient denies any chest pain, dyspnea, palpitations, lower extremity edema, orthopnea, PND, dizziness, near syncope or syncope. He feels great. He did 65 pushups this morning.    Primary Care Physician: Biagio Borg, MD  Past Medical History:  Diagnosis Date  . Aortic insufficiency   . Coronary artery disease    coronary artery bypass graft x 5 in 1996  . Diabetes mellitus   . Hypercholesterolemia    non-insulin dependent  . Hypertension   . Ischemic cardiomyopathy   . Mitral regurgitation   . PVC (premature ventricular contraction)     Past Surgical History:  Procedure Laterality Date  . CORONARY ARTERY BYPASS GRAFT    . LUNG SURGERY    . OTHER SURGICAL HISTORY     percutaneous coronary intervention of the  posterolateral segment on 01/27/1998    Current Outpatient Medications  Medication Sig Dispense Refill  . albuterol (PROVENTIL HFA;VENTOLIN HFA) 108 (90 Base) MCG/ACT inhaler Inhale 2 puffs into the lungs every 6 (six) hours as needed for wheezing or shortness of breath. 1 Inhaler 11  . aspirin 81 MG tablet  Take 81 mg by mouth daily.    Marland Kitchen azithromycin (ZITHROMAX) 250 MG tablet 2 tab by mouth day 1, then 1 per day 6 tablet 1  . doxazosin (CARDURA) 2 MG tablet Take 1 tablet (2 mg total) by mouth at bedtime. 90 tablet 3  . levofloxacin (LEVAQUIN) 250 MG tablet Take 1 tablet (250 mg total) by mouth daily. 10 tablet 0  . losartan (COZAAR) 25 MG tablet Take 2 tablets (50 mg total) by mouth daily. 180 tablet 2  . metoprolol tartrate (LOPRESSOR) 50 MG tablet Take 0.5 tablets (25 mg total) by mouth 2 (two) times daily. Please keep upcoming appt in November with Dr. Angelena Form for future refills. Thank you 90 tablet 0  . pramipexole (MIRAPEX) 0.25 MG tablet Take 1 tablet (0.25 mg total) by mouth at bedtime. 90 tablet 3  . rosuvastatin (CRESTOR) 20 MG tablet Take 1 tablet (20 mg total) by mouth daily. Please keep upcoming appt in November with Dr. Angelena Form for future refills. Thank you 90 tablet 0  . tamsulosin (FLOMAX) 0.4 MG CAPS capsule TAKE 1 CAPSULE BY MOUTH EVERY DAY 90 capsule 1  . traZODone (DESYREL) 50 MG tablet TAKE 1/2 TO 1 TABLET BY MOUTH AT BEDTIME AS NEEDED FOR SLEEP 90 tablet 1  . vitamin C (ASCORBIC ACID) 500 MG tablet Take 500 mg by mouth daily.    . citalopram (CELEXA) 10 MG tablet Take 1 tablet (10 mg total) by mouth daily. [place]  tablet 3  . furosemide (LASIX) 20 MG tablet Take 1 tablet (20 mg total) by mouth daily as needed (for swelling). 20 tablet 11   No current facility-administered medications for this visit.     No Known Allergies  Social History   Socioeconomic History  . Marital status: Married    Spouse name: Not on file  . Number of children: 1  . Years of education: Not on file  . Highest education level: Not on file  Occupational History  . Occupation: Runs a Acupuncturist: SELF-EMPLOYED  Social Needs  . Financial resource strain: Not on file  . Food insecurity    Worry: Never true    Inability: Never true  . Transportation needs    Medical: No     Non-medical: No  Tobacco Use  . Smoking status: Never Smoker  . Smokeless tobacco: Never Used  Substance and Sexual Activity  . Alcohol use: No  . Drug use: No  . Sexual activity: Not Currently  Lifestyle  . Physical activity    Days per week: 3 days    Minutes per session: 30 min  . Stress: Only a little  Relationships  . Social Herbalist on phone: Not on file    Gets together: Not on file    Attends religious service: Not on file    Active member of club or organization: Not on file    Attends meetings of clubs or organizations: Not on file    Relationship status: Not on file  . Intimate partner violence    Fear of current or ex partner: Not on file    Emotionally abused: Not on file    Physically abused: Not on file    Forced sexual activity: Not on file  Other Topics Concern  . Not on file  Social History Narrative  . Not on file    Family History  Problem Relation Age of Onset  . CAD Neg Hx     Review of Systems:  As stated in the HPI and otherwise negative.   BP (!) 150/60   Pulse (!) 48   Ht 5\' 10"  (1.778 m)   Wt 168 lb 12.8 oz (76.6 kg)   SpO2 97%   BMI 24.22 kg/m   Physical Examination: General: Well developed, well nourished, NAD  HEENT: OP clear, mucus membranes moist  SKIN: warm, dry. No rashes. Neuro: No focal deficits  Musculoskeletal: Muscle strength 5/5 all ext  Psychiatric: Mood and affect normal  Neck: No JVD, no carotid bruits, no thyromegaly, no lymphadenopathy.  Lungs:Clear bilaterally, no wheezes, rhonci, crackles Cardiovascular: Regular rate and rhythm. Soft systolic murmur.  Abdomen:Soft. Bowel sounds present. Non-tender.  Extremities: No lower extremity edema. Pulses are 2 + in the bilateral DP/PT.  Echo September 2019: - Left ventricle: The cavity size was moderately dilated. Wall   thickness was increased in a pattern of severe LVH. Systolic   function was severely reduced. The estimated ejection fraction   was in  the range of 25% to 30%. Diffuse hypokinesis. Left   ventricular diastolic function parameters were normal. - Aortic valve: There was mild regurgitation. - Mitral valve: There was moderate regurgitation. - Left atrium: The atrium was moderately dilated. - Atrial septum: No defect or patent foramen ovale was identified.  EKG:  EKG is ordered today. The ekg ordered today demonstrates Sinus bradycardia, rate 48 bpm. LBBB  Recent Labs: No results found for requested labs within  last 8760 hours.   Lipid Panel    Component Value Date/Time   CHOL 135 09/06/2017 1024   TRIG 170.0 (H) 09/06/2017 1024   TRIG 102 10/22/2005 0730   HDL 62.70 09/06/2017 1024   CHOLHDL 2 09/06/2017 1024   VLDL 34.0 09/06/2017 1024   LDLCALC 38 09/06/2017 1024     Wt Readings from Last 3 Encounters:  11/17/18 168 lb 12.8 oz (76.6 kg)  02/18/18 171 lb (77.6 kg)  09/18/17 168 lb (76.2 kg)     Other studies Reviewed: Additional studies/ records that were reviewed today include: . Review of the above records demonstrates:   Assessment and Plan:   1. CAD without angina: He has no chest pain. Continue ASA, statin and beta blocker.    2. Cardiomyopathy, ischemic: LVEF=25-30% by echo September 2019. He has not wished to consider an ICD during previous discussion. Continue Cozaar and Lopressor.    3. Hyperlipidemia: LDL at goal in August 2019. Repeat lipids and LFTs now. Continue statin.     4. LBBB: Chronic.   5. HTN: BP is controlled at home.   6. Chronic systolic CHF: Weight is stable. No evidence of volume overload. Continue Lasix prn   Current medicines are reviewed at length with the patient today.  The patient does not have concerns regarding medicines.  The following changes have been made:  no change  Labs/ tests ordered today include:   Orders Placed This Encounter  Procedures  . Hepatic function panel  . Lipid Profile  . EKG 12-Lead     Disposition:   FU with me in 12 months    Signed, Lauree Chandler, MD 11/17/2018 11:30 AM    [facility] [address], [place], [place]  [address] Phone: [phone]; Fax: [phone]

## 2018-11-17 ENCOUNTER — Encounter: Payer: Self-pay | Admitting: Cardiovascular Disease

## 2018-11-17 ENCOUNTER — Ambulatory Visit: Payer: Medicare HMO | Admitting: Cardiovascular Disease

## 2018-11-17 ENCOUNTER — Other Ambulatory Visit: Payer: Self-pay

## 2018-11-17 VITALS — BP 150/60 | HR 48 | Ht 70.0 in | Wt 168.8 lb

## 2018-11-17 DIAGNOSIS — I447 Left bundle-branch block, unspecified: Secondary | ICD-10-CM | POA: Diagnosis not present

## 2018-11-17 DIAGNOSIS — I2510 Atherosclerotic heart disease of native coronary artery without angina pectoris: Secondary | ICD-10-CM

## 2018-11-17 DIAGNOSIS — I10 Essential (primary) hypertension: Secondary | ICD-10-CM

## 2018-11-17 DIAGNOSIS — E7800 Pure hypercholesterolemia, unspecified: Secondary | ICD-10-CM

## 2018-11-17 DIAGNOSIS — I5022 Chronic systolic (congestive) heart failure: Secondary | ICD-10-CM | POA: Diagnosis not present

## 2018-11-17 DIAGNOSIS — I255 Ischemic cardiomyopathy: Secondary | ICD-10-CM

## 2018-11-17 NOTE — Patient Instructions (Signed)
Medication Instructions:  No changes *If you need a refill on your cardiac medications before your next appointment, please call your pharmacy*  Lab Work: Fasting lipids and liver function panel -- If you have labs (blood work) drawn today and your tests are completely normal, you will receive your results only by: Marland Kitchen MyChart Message (if you have MyChart) OR . A paper copy in the mail If you have any lab test that is abnormal or we need to change your treatment, we will call you to review the results.  Testing/Procedures: none  Follow-Up: At [facility], you and your health needs are our priority.  As part of our continuing mission to provide you with exceptional heart care, we have created designated Provider Care Teams.  These Care Teams include your primary Cardiologist (physician) and Advanced Practice Providers (APPs -  Physician Assistants and Nurse Practitioners) who all work together to provide you with the care you need, when you need it.  Your next appointment:   12 months  The format for your next appointment:   In Person  Provider:   Lauree Chandler, MD  Other Instructions

## 2018-12-01 ENCOUNTER — Other Ambulatory Visit: Payer: Self-pay

## 2018-12-01 ENCOUNTER — Other Ambulatory Visit: Payer: Medicare HMO | Admitting: *Deleted

## 2018-12-01 DIAGNOSIS — I10 Essential (primary) hypertension: Secondary | ICD-10-CM | POA: Diagnosis not present

## 2018-12-01 DIAGNOSIS — I447 Left bundle-branch block, unspecified: Secondary | ICD-10-CM | POA: Diagnosis not present

## 2018-12-01 DIAGNOSIS — I255 Ischemic cardiomyopathy: Secondary | ICD-10-CM | POA: Diagnosis not present

## 2018-12-01 DIAGNOSIS — I2510 Atherosclerotic heart disease of native coronary artery without angina pectoris: Secondary | ICD-10-CM | POA: Diagnosis not present

## 2018-12-01 DIAGNOSIS — E7800 Pure hypercholesterolemia, unspecified: Secondary | ICD-10-CM | POA: Diagnosis not present

## 2018-12-01 DIAGNOSIS — I5022 Chronic systolic (congestive) heart failure: Secondary | ICD-10-CM | POA: Diagnosis not present

## 2018-12-01 LAB — HEPATIC FUNCTION PANEL
ALT: 13 IU/L (ref 0–44)
AST: 14 IU/L (ref 0–40)
Albumin: 3.7 g/dL (ref 3.6–4.6)
Alkaline Phosphatase: 69 IU/L (ref 39–117)
Bilirubin Total: 0.3 mg/dL (ref 0.0–1.2)
Bilirubin, Direct: 0.11 mg/dL (ref 0.00–0.40)
Total Protein: 6.5 g/dL (ref 6.0–8.5)

## 2018-12-01 LAB — LIPID PANEL
Chol/HDL Ratio: 2.4 ratio (ref 0.0–5.0)
Cholesterol, Total: 98 mg/dL — ABNORMAL LOW (ref 100–199)
HDL: 41 mg/dL (ref >39)
LDL Chol Calc (NIH): 35 mg/dL (ref 0–99)
Triglycerides: 124 mg/dL (ref 0–149)
VLDL Cholesterol Cal: 22 mg/dL (ref 5–40)

## 2019-01-09 ENCOUNTER — Other Ambulatory Visit: Payer: Self-pay | Admitting: Cardiovascular Disease

## 2019-01-10 ENCOUNTER — Other Ambulatory Visit: Payer: Self-pay | Admitting: Cardiovascular Disease

## 2019-01-15 ENCOUNTER — Other Ambulatory Visit: Payer: Self-pay | Admitting: Cardiovascular Disease

## 2019-01-15 MED ORDER — LOSARTAN POTASSIUM 25 MG PO TABS: 50.0000 mg | ORAL_TABLET | Freq: Every day | ORAL | 3 refills | Status: DC

## 2019-02-13 ENCOUNTER — Other Ambulatory Visit: Payer: Self-pay | Admitting: Internal Medicine

## 2019-05-22 ENCOUNTER — Other Ambulatory Visit: Payer: Self-pay

## 2019-05-22 ENCOUNTER — Encounter (HOSPITAL_COMMUNITY): Payer: Self-pay | Admitting: Emergency Medicine

## 2019-05-22 ENCOUNTER — Emergency Department (HOSPITAL_COMMUNITY): Payer: Medicare HMO

## 2019-05-22 ENCOUNTER — Emergency Department (HOSPITAL_COMMUNITY)
Admission: EM | Admit: 2019-05-22 | Discharge: 2019-05-22 | Disposition: A | Discharge status: Home / Self Care | Payer: Medicare HMO | Attending: Emergency Medicine | Admitting: Emergency Medicine

## 2019-05-22 DIAGNOSIS — I2510 Atherosclerotic heart disease of native coronary artery without angina pectoris: Secondary | ICD-10-CM | POA: Diagnosis not present

## 2019-05-22 DIAGNOSIS — I10 Essential (primary) hypertension: Secondary | ICD-10-CM | POA: Insufficient documentation

## 2019-05-22 DIAGNOSIS — Z7984 Long term (current) use of oral hypoglycemic drugs: Secondary | ICD-10-CM | POA: Diagnosis not present

## 2019-05-22 DIAGNOSIS — Z23 Encounter for immunization: Secondary | ICD-10-CM | POA: Insufficient documentation

## 2019-05-22 DIAGNOSIS — S0101XA Laceration without foreign body of scalp, initial encounter: Secondary | ICD-10-CM | POA: Insufficient documentation

## 2019-05-22 DIAGNOSIS — W208XXA Other cause of strike by thrown, projected or falling object, initial encounter: Secondary | ICD-10-CM | POA: Diagnosis not present

## 2019-05-22 DIAGNOSIS — Y999 Unspecified external cause status: Secondary | ICD-10-CM | POA: Diagnosis not present

## 2019-05-22 DIAGNOSIS — Z7982 Long term (current) use of aspirin: Secondary | ICD-10-CM | POA: Diagnosis not present

## 2019-05-22 DIAGNOSIS — Y93I9 Activity, other involving external motion: Secondary | ICD-10-CM | POA: Diagnosis not present

## 2019-05-22 DIAGNOSIS — Y929 Unspecified place or not applicable: Secondary | ICD-10-CM | POA: Insufficient documentation

## 2019-05-22 DIAGNOSIS — S0990XA Unspecified injury of head, initial encounter: Secondary | ICD-10-CM | POA: Diagnosis not present

## 2019-05-22 DIAGNOSIS — E119 Type 2 diabetes mellitus without complications: Secondary | ICD-10-CM | POA: Insufficient documentation

## 2019-05-22 MED ORDER — TETANUS-DIPHTH-ACELL PERTUSSIS 5-2.5-18.5 LF-MCG/0.5 IM SUSP
0.5000 mL | Freq: Once | INTRAMUSCULAR | Status: AC
  Administered 2019-05-22: 0.5 mL via INTRAMUSCULAR
  Filled 2019-05-22: qty 0.5

## 2019-05-22 MED ORDER — LIDOCAINE-EPINEPHRINE (PF) 2 %-1:200000 IJ SOLN
10.0000 mL | Freq: Once | INTRAMUSCULAR | Status: AC
  Administered 2019-05-22: 10 mL
  Filled 2019-05-22: qty 20

## 2019-05-22 NOTE — ED Triage Notes (Signed)
Patient states he was riding tractor when a thick tree limb landed on right side of head. Approximately 12-15 feet drop. Denies any LOC or blood thinners. States he is only here because his wife made him come. Denies any pain. Alert and orientated x4.

## 2019-05-22 NOTE — ED Provider Notes (Signed)
[facility] EMERGENCY DEPARTMENT Provider Note   CSN: [id number] Arrival date & time: 05/22/19  1252     History Chief Complaint  Patient presents with  . Head Injury    Philip Kerr is a 86 y.o. male.  HPI Patient is a healthy 86 year old male who presents for a scalp laceration.  Patient was riding on his tractor earlier today when he ran over a approximately 4 inch tree limb.  A piece of the tree limb flew up in the air and came down onto his head.  He denies any LOC.  He went back to his house due to the bleeding.  He states that he pulled a right a piece of wood out from his skin.  He held pressure on the wound with a towel.  He came to the ED at the behest of his wife.  Patient denies any headache, nausea, vomiting, neck pain, visual changes, or any areas of sensory or motor loss.  Patient is not on any blood thinners, other than a baby aspirin.     Past Medical History:  Diagnosis Date  . Aortic insufficiency   . Coronary artery disease    coronary artery bypass graft x 5 in 1996  . Diabetes mellitus   . Hypercholesterolemia    non-insulin dependent  . Hypertension   . Ischemic cardiomyopathy   . Mitral regurgitation   . PVC (premature ventricular contraction)     Patient Active Problem List   Diagnosis Date Noted  . Acute upper respiratory infection 11/04/2018  . Slow urinary stream 09/18/2017  . Dizzy 09/18/2017  . Dementia ([place]) 09/18/2017  . Low testosterone in male 09/18/2017  . Depression 09/18/2017  . Hyponatremia 09/18/2017  . Hyperkalemia 09/18/2017  . Anemia 09/18/2017  . Vitamin D deficiency 09/18/2017  . Weight loss 09/18/2017  . Fatigue 09/06/2017  . RLS (restless legs syndrome) 08/27/2017  . Rash 08/27/2017  . Dysuria 08/23/2017  . Insomnia 08/23/2017  . Encounter for well adult exam with abnormal findings 11/27/2016  . Cardiomyopathy, ischemic 06/13/2012  . Murmur 04/21/2012  . Coronary artery disease 04/05/2011  .  Diabetes ([place]) 01/21/2009  . Essential hypertension 01/21/2009  . HYPERCHOLESTEROLEMIA 01/20/2009  . CAD, ARTERY BYPASS GRAFT 01/20/2009    Past Surgical History:  Procedure Laterality Date  . CORONARY ARTERY BYPASS GRAFT    . LUNG SURGERY    . OTHER SURGICAL HISTORY     percutaneous coronary intervention of the  posterolateral segment on 01/27/1998       Family History  Problem Relation Age of Onset  . CAD Neg Hx     Social History   Tobacco Use  . Smoking status: Never Smoker  . Smokeless tobacco: Never Used  Substance Use Topics  . Alcohol use: No  . Drug use: No    Home Medications Prior to Admission medications   Medication Sig Start Date End Date Taking? Authorizing Provider  albuterol (PROVENTIL HFA;VENTOLIN HFA) 108 (90 Base) MCG/ACT inhaler Inhale 2 puffs into the lungs every 6 (six) hours as needed for wheezing or shortness of breath. 02/18/18   Biagio Borg, MD  aspirin 81 MG tablet Take 81 mg by mouth daily.    [provider]  azithromycin (ZITHROMAX) 250 MG tablet 2 tab by mouth day 1, then 1 per day 11/04/18   Biagio Borg, MD  citalopram (CELEXA) 10 MG tablet Take 1 tablet (10 mg total) by mouth daily. 09/18/17 09/18/18  Biagio Borg,  MD  doxazosin (CARDURA) 2 MG tablet TAKE 1 TABLET (2 MG TOTAL) BY MOUTH AT BEDTIME. 02/13/19   Biagio Borg, MD  furosemide (LASIX) 20 MG tablet Take 1 tablet (20 mg total) by mouth daily as needed (for swelling). 04/02/16 07/01/16  Burnell Blanks, MD  levofloxacin (LEVAQUIN) 250 MG tablet Take 1 tablet (250 mg total) by mouth daily. 02/18/18   Biagio Borg, MD  losartan (COZAAR) 25 MG tablet Take 2 tablets (50 mg total) by mouth daily. 01/15/19   Burnell Blanks, MD  metoprolol tartrate (LOPRESSOR) 50 MG tablet TAKE 0.5 TABLET (25 MG TOTAL) BY MOUTH 2X DAILY. KEEP APPT IN [place] REFILLS. 01/12/19   Burnell Blanks, MD  pramipexole (MIRAPEX) 0.25 MG tablet Take 1 tablet (0.25 mg  total) by mouth at bedtime. 08/27/17   Biagio Borg, MD  rosuvastatin (CRESTOR) 20 MG tablet TAKE 1 TABLET BY MOUTH DAILY. KEEP UPCOMING APPT IN [place] REFILLS. 01/12/19   Burnell Blanks, MD  tamsulosin (FLOMAX) 0.4 MG CAPS capsule TAKE 1 CAPSULE BY MOUTH EVERY DAY 01/29/18   Biagio Borg, MD  traZODone (DESYREL) 50 MG tablet TAKE 1/2 TO 1 TABLET BY MOUTH AT BEDTIME AS NEEDED FOR SLEEP 08/25/18   Biagio Borg, MD  vitamin C (ASCORBIC ACID) 500 MG tablet Take 500 mg by mouth daily.    [provider]    Allergies    Patient has no known allergies.  Review of Systems   Review of Systems  Constitutional: Negative for chills and fever.  HENT: Negative for ear pain and sore throat.   Eyes: Negative for pain and visual disturbance.  Respiratory: Negative for cough and shortness of breath.   Cardiovascular: Negative for chest pain and palpitations.  Gastrointestinal: Negative for abdominal pain and vomiting.  Genitourinary: Negative for dysuria and hematuria.  Musculoskeletal: Negative for arthralgias and back pain.  Skin: Positive for wound. Negative for color change and rash.  Neurological: Negative for dizziness, tremors, seizures, syncope, facial asymmetry, speech difficulty, weakness, light-headedness, numbness and headaches.  Hematological: Does not bruise/bleed easily.  Psychiatric/Behavioral: Negative for confusion and decreased concentration.  All other systems reviewed and are negative.   Physical Exam Updated Vital Signs BP (!) 146/55 (BP Location: Left Arm)   Pulse 72   Temp 98 F (36.7 C) (Oral)   Resp 16   SpO2 99%   Physical Exam Vitals and nursing note reviewed.  Constitutional:      General: He is not in acute distress.    Appearance: He is well-developed and normal weight. He is not ill-appearing, toxic-appearing or diaphoretic.  HENT:     Head:     Comments: 1 cm laceration to right parietal region of scalp.   Hemostatic.  No significant surrounding swelling or erythema.    Right Ear: External ear normal.     Left Ear: External ear normal.     Nose: Nose normal.     Mouth/Throat:     Mouth: Mucous membranes are moist.     Pharynx: Oropharynx is clear.  Eyes:     General: No visual field deficit.    Extraocular Movements: Extraocular movements intact.     Conjunctiva/sclera: Conjunctivae normal.     Pupils: Pupils are equal, round, and reactive to light.  Cardiovascular:     Rate and Rhythm: Normal rate and regular rhythm.     Heart sounds: No murmur.  Pulmonary:  Effort: Pulmonary effort is normal. No respiratory distress.     Breath sounds: Normal breath sounds. No wheezing or rales.  Chest:     Chest wall: No tenderness.  Abdominal:     General: Abdomen is flat.     Palpations: Abdomen is soft.     Tenderness: There is no abdominal tenderness. There is no guarding.  Musculoskeletal:        General: No swelling, tenderness, deformity or signs of injury. Normal range of motion.     Cervical back: Normal range of motion and neck supple. No rigidity or tenderness.     Right lower leg: No edema.     Left lower leg: No edema.  Skin:    General: Skin is warm and dry.     Capillary Refill: Capillary refill takes less than 2 seconds.     Coloration: Skin is not jaundiced.     Findings: No bruising.  Neurological:     General: No focal deficit present.     Mental Status: He is alert and oriented to person, place, and time.     Cranial Nerves: Cranial nerves are intact. No cranial nerve deficit, dysarthria or facial asymmetry.     Sensory: Sensation is intact. No sensory deficit.     Motor: Motor function is intact. No weakness or abnormal muscle tone.     Coordination: Coordination is intact. Romberg sign negative. Coordination normal. Finger-Nose-Finger Test normal.     Gait: Gait is intact. Gait and tandem walk normal.  Psychiatric:        Mood and Affect: Mood normal.         Behavior: Behavior normal.     ED Results / Procedures / Treatments   Labs (all labs ordered are listed, but only abnormal results are displayed) Labs Reviewed - No data to display  EKG None  Radiology CT Head Wo Contrast  Result Date: 05/22/2019 CLINICAL DATA:  Tree fell and struck head. EXAM: CT HEAD WITHOUT CONTRAST TECHNIQUE: Contiguous axial images were obtained from the base of the skull through the vertex without intravenous contrast. COMPARISON:  None. FINDINGS: Brain: Mild age related volume loss. Mild chronic small-vessel change of the hemispheric white matter. No sign of acute infarction, mass lesion, hemorrhage, hydrocephalus or extra-axial collection. Vascular: There is atherosclerotic calcification of the major vessels at the base of the brain. Skull: No skull fracture. Sinuses/Orbits: Mucosal inflammatory changes of the maxillary and ethmoid sinuses. Other: None IMPRESSION: No acute or traumatic finding. Age related volume loss and chronic small-vessel change of the white matter. Electronically Signed   By: Nelson Chimes M.D.   On: 05/22/2019 15:43    Procedures .Marland KitchenLaceration Repair  Date/Time: 05/22/2019 5:38 PM Performed by: Godfrey Pick, MD Authorized by: Quintella Reichert, MD   Consent:    Consent obtained:  Verbal   Consent given by:  Patient   Risks discussed:  Infection, pain, need for additional repair and retained foreign body   Alternatives discussed:  No treatment Anesthesia (see MAR for exact dosages):    Anesthesia method:  Local infiltration   Local anesthetic:  Lidocaine 2% WITH epi Laceration details:    Location:  Scalp   Scalp location:  R parietal   Length (cm):  1   Depth (mm):  5 Repair type:    Repair type:  Simple Pre-procedure details:    Preparation:  Imaging obtained to evaluate for foreign bodies Exploration:    Hemostasis achieved with:  Direct pressure   Wound  exploration: wound explored through full range of motion and entire depth of  wound probed and visualized     Wound extent: no foreign bodies/material noted, no underlying fracture noted and no vascular damage noted     Contaminated: no   Treatment:    Area cleansed with:  Saline   Amount of cleaning:  Standard   Irrigation solution:  Sterile water   Irrigation volume:  400   Irrigation method:  Syringe   Visualized foreign bodies/material removed: no   Skin repair:    Repair method:  Staples   Number of staples:  2 Approximation:    Approximation:  Close Post-procedure details:    Dressing:  Antibiotic ointment   Patient tolerance of procedure:  Tolerated well, no immediate complications   (including critical care time)  Medications Ordered in ED Medications  lidocaine-EPINEPHrine (XYLOCAINE W/EPI) 2 %-1:200000 (PF) injection 10 mL (10 mLs Infiltration Given 05/22/19 1718)  Tdap (BOOSTRIX) injection 0.5 mL (0.5 mLs Intramuscular Given 05/22/19 1715)    ED Course  I have reviewed the triage vital signs and the nursing notes.  Pertinent labs & imaging results that were available during my care of the patient were reviewed by me and considered in my medical decision making (see chart for details).    MDM Rules/Calculators/A&P                      Patient is a healthy 86 year old male who presents after a head injury.  While riding his tractor, tree limb flew up in the air and landed on the right frontal parietal region of his scalp.  He sustained a 1 cm laceration.  He denies any LOC or any symptoms since the accident.  Upon exam, he has no focal neurologic deficits.  Wound is hemostatic, without significant swelling or erythema.  He does state that he pulled a "piece of rotted wood" out of the wound initially.  Given the high suspicion of retained foreign body, wound was thoroughly irrigated and explored.  No further foreign bodies were identified.  Additionally, no foreign bodies were identified on CT scan.  Tetanus was updated.  Wound was closed with 2  staples, as per procedure note above.  No antibiotics are indicated at this time.  Patient was given return precautions, in the event that area becomes red, swollen, painful, or purulent.  He was discharged home in good condition.  Final Clinical Impression(s) / ED Diagnoses Final diagnoses:  Laceration of scalp, initial encounter    Rx / DC Orders ED Discharge Orders    None       Godfrey Pick, MD 05/23/19 Ernestine Mcmurray    Quintella Reichert, MD 05/25/19 1350

## 2019-05-22 NOTE — Discharge Instructions (Addendum)
Staples (2) will need to be removed in 1 week.  Return to the ED if you notice any swelling, redness, pain, or drainage from the area of the wound.

## 2019-05-29 ENCOUNTER — Other Ambulatory Visit: Payer: Self-pay

## 2019-05-29 ENCOUNTER — Ambulatory Visit (INDEPENDENT_AMBULATORY_CARE_PROVIDER_SITE_OTHER): Payer: Medicare HMO | Admitting: Internal Medicine

## 2019-05-29 ENCOUNTER — Encounter: Payer: Self-pay | Admitting: Internal Medicine

## 2019-05-29 VITALS — BP 140/72 | HR 58 | Temp 97.8°F | Ht 70.0 in | Wt 180.0 lb

## 2019-05-29 DIAGNOSIS — F0390 Unspecified dementia without behavioral disturbance: Secondary | ICD-10-CM

## 2019-05-29 DIAGNOSIS — I10 Essential (primary) hypertension: Secondary | ICD-10-CM | POA: Diagnosis not present

## 2019-05-29 DIAGNOSIS — S0101XD Laceration without foreign body of scalp, subsequent encounter: Secondary | ICD-10-CM | POA: Diagnosis not present

## 2019-05-29 DIAGNOSIS — E119 Type 2 diabetes mellitus without complications: Secondary | ICD-10-CM | POA: Diagnosis not present

## 2019-05-29 NOTE — Progress Notes (Signed)
Subjective:    Patient ID: Philip Kerr, male    DOB: 05/21/1933, 86 y.o.   MRN: [id number]  HPI  Here to f/u after accidental tractor accident with laceration to scalp near right crown area; 2 staples placed 5/14; no worsening red, tender, swelling; Pt denies new neurological symptoms such as new headache, or facial or extremity weakness or numbness  Pt denies chest pain, increased sob or doe, wheezing, orthopnea, PND, increased LE swelling, palpitations, dizziness or syncope.   Pt denies polydipsia, polyuria. Dementia overall stable symptomatically, and not assoc with behavioral changes such as hallucinations, paranoia, or agitation.   Past Medical History:  Diagnosis Date  . Aortic insufficiency   . Coronary artery disease    coronary artery bypass graft x 5 in 1996  . Diabetes mellitus   . Hypercholesterolemia    non-insulin dependent  . Hypertension   . Ischemic cardiomyopathy   . Mitral regurgitation   . PVC (premature ventricular contraction)    Past Surgical History:  Procedure Laterality Date  . CORONARY ARTERY BYPASS GRAFT    . LUNG SURGERY    . OTHER SURGICAL HISTORY     percutaneous coronary intervention of the  posterolateral segment on 01/27/1998    reports that he has never smoked. He has never used smokeless tobacco. He reports that he does not drink alcohol or use drugs. family history is not on file. No Known Allergies Current Outpatient Medications on File Prior to Visit  Medication Sig Dispense Refill  . albuterol (PROVENTIL HFA;VENTOLIN HFA) 108 (90 Base) MCG/ACT inhaler Inhale 2 puffs into the lungs every 6 (six) hours as needed for wheezing or shortness of breath. 1 Inhaler 11  . aspirin 81 MG tablet Take 81 mg by mouth daily.    Marland Kitchen azithromycin (ZITHROMAX) 250 MG tablet 2 tab by mouth day 1, then 1 per day 6 tablet 1  . doxazosin (CARDURA) 2 MG tablet TAKE 1 TABLET (2 MG TOTAL) BY MOUTH AT BEDTIME. 90 tablet 2  . levofloxacin (LEVAQUIN) 250 MG tablet Take  1 tablet (250 mg total) by mouth daily. 10 tablet 0  . losartan (COZAAR) 25 MG tablet Take 2 tablets (50 mg total) by mouth daily. 180 tablet 3  . losartan (COZAAR) 50 MG tablet Take 50 mg by mouth daily.    . metoprolol tartrate (LOPRESSOR) 50 MG tablet TAKE 0.5 TABLET (25 MG TOTAL) BY MOUTH 2X DAILY. KEEP APPT IN NOVEMBER W/ DR. Angelena Form FOR REFILLS. 90 tablet 3  . pramipexole (MIRAPEX) 0.25 MG tablet Take 1 tablet (0.25 mg total) by mouth at bedtime. 90 tablet 3  . rosuvastatin (CRESTOR) 20 MG tablet TAKE 1 TABLET BY MOUTH DAILY. KEEP UPCOMING APPT IN NOVEMBER WITH DR. [place] REFILLS. 90 tablet 3  . tamsulosin (FLOMAX) 0.4 MG CAPS capsule TAKE 1 CAPSULE BY MOUTH EVERY DAY 90 capsule 1  . traZODone (DESYREL) 50 MG tablet TAKE 1/2 TO 1 TABLET BY MOUTH AT BEDTIME AS NEEDED FOR SLEEP 90 tablet 1  . vitamin C (ASCORBIC ACID) 500 MG tablet Take 500 mg by mouth daily.    . citalopram (CELEXA) 10 MG tablet Take 1 tablet (10 mg total) by mouth daily. 90 tablet 3  . furosemide (LASIX) 20 MG tablet Take 1 tablet (20 mg total) by mouth daily as needed (for swelling). 20 tablet 11   No current facility-administered medications on file prior to visit.   Review of Systems All otherwise neg per pt  Objective:   Physical Exam BP 140/72 (BP Location: Left Arm, Patient Position: Sitting, Cuff Size: Large)   Pulse (!) 58   Temp 97.8 F (36.6 C) (Oral)   Ht 5\' 10"  (1.778 m)   Wt 180 lb (81.6 kg)   SpO2 98%   BMI 25.83 kg/m  VS noted,  Constitutional: Pt appears in NAD HENT: Head: NCAT.  Right Ear: External ear normal.  Left Ear: External ear normal.  Eyes: . Pupils are equal, round, and reactive to light. Conjunctivae and EOM are normal Nose: without d/c or deformity Neck: Neck supple. Gross normal ROM Cardiovascular: Normal rate and regular rhythm.   Pulmonary/Chest: Effort normal and breath sounds without rales or wheezing.  Neurological: Pt is alert. At baseline orientation,  motor grossly intact Skin: Skin is warm. No rashes, other new lesions, no LE edema, scalp staples x 2 removed Psychiatric: Pt behavior is normal without agitation  All otherwise neg per pt Lab Results  Component Value Date   WBC 9.8 09/18/2017   HGB 13.1 09/18/2017   HCT 38.9 (L) 09/18/2017   PLT 210.0 09/18/2017   GLUCOSE 99 09/18/2017   CHOL 98 (L) 12/01/2018   TRIG 124 12/01/2018   HDL 41 12/01/2018   LDLCALC 35 12/01/2018   ALT 13 12/01/2018   AST 14 12/01/2018   NA 136 09/18/2017   K 4.7 09/18/2017   CL 103 09/18/2017   CREATININE 1.31 09/18/2017   BUN 40 (H) 09/18/2017   CO2 26 09/18/2017   TSH 2.50 09/06/2017   PSA 1.97 09/06/2017   HGBA1C 6.8 (H) 09/06/2017      Assessment & Plan:

## 2019-05-30 ENCOUNTER — Encounter: Payer: Self-pay | Admitting: Internal Medicine

## 2019-05-30 DIAGNOSIS — S0101XD Laceration without foreign body of scalp, subsequent encounter: Secondary | ICD-10-CM | POA: Insufficient documentation

## 2019-05-30 NOTE — Assessment & Plan Note (Addendum)
Resolved, ,staples removed,  to f/u any worsening symptoms or concerns I spent 31 minutes in preparing to see the patient by review of recent labs, imaging and procedures, obtaining and reviewing separately obtained history, communicating with the patient and family or caregiver, ordering medications, tests or procedures, and documenting clinical information in the EHR including the differential Dx, treatment, and any further evaluation and other management of scalp laceration, dm, htn, dementia

## 2019-05-30 NOTE — Patient Instructions (Signed)
Your staples were removed today  Please continue all other medications as before, and refills have been done if requested.  Please have the pharmacy call with any other refills you may need.  Please continue your efforts at being more active, low cholesterol diet, and weight control.  You are otherwise up to date with prevention measures today.  Please keep your appointments with your specialists as you may have planned

## 2019-05-30 NOTE — Assessment & Plan Note (Signed)
stable overall by history and exam, recent data reviewed with pt, and pt to continue medical treatment as before,  to f/u any worsening symptoms or concerns  

## 2019-05-30 NOTE — Assessment & Plan Note (Addendum)
stable overall by history and exam, recent data reviewed with pt, and pt to continue medical treatment as before,  to f/u any worsening symptoms or concerns, declines labs today

## 2019-07-17 ENCOUNTER — Encounter: Payer: Self-pay | Admitting: Internal Medicine

## 2019-07-17 ENCOUNTER — Ambulatory Visit (INDEPENDENT_AMBULATORY_CARE_PROVIDER_SITE_OTHER): Payer: Medicare HMO | Admitting: Internal Medicine

## 2019-07-17 VITALS — BP 132/60 | HR 64 | Temp 97.7°F | Ht 70.0 in | Wt 182.0 lb

## 2019-07-17 DIAGNOSIS — Z0000 Encounter for general adult medical examination without abnormal findings: Secondary | ICD-10-CM

## 2019-07-17 DIAGNOSIS — E559 Vitamin D deficiency, unspecified: Secondary | ICD-10-CM | POA: Diagnosis not present

## 2019-07-17 DIAGNOSIS — Z23 Encounter for immunization: Secondary | ICD-10-CM

## 2019-07-17 DIAGNOSIS — E119 Type 2 diabetes mellitus without complications: Secondary | ICD-10-CM | POA: Diagnosis not present

## 2019-07-17 DIAGNOSIS — E538 Deficiency of other specified B group vitamins: Secondary | ICD-10-CM | POA: Diagnosis not present

## 2019-07-17 NOTE — Progress Notes (Signed)
Subjective:    Patient ID: Philip Kerr, male    DOB: 1933-08-27, 86 y.o.   MRN: [id number]  HPI  Here for wellness and f/u;  Overall doing ok;  Pt denies Chest pain, worsening SOB, DOE, wheezing, orthopnea, PND, worsening LE edema, palpitations, dizziness or syncope.  Pt denies neurological change such as new headache, facial or extremity weakness.  Pt denies polydipsia, polyuria, or low sugar symptoms. Pt states overall good compliance with treatment and medications, good tolerability, and has been trying to follow appropriate diet.  Pt denies worsening depressive symptoms, suicidal ideation or panic. No fever, night sweats, wt loss, loss of appetite, or other constitutional symptoms.  Pt states good ability with ADL's, has low fall risk, home safety reviewed and adequate, no other significant changes in hearing or vision, and only occasionally active with exercise. Wt Readings from Last 3 Encounters:  07/17/19 182 lb (82.6 kg)  05/29/19 180 lb (81.6 kg)  11/17/18 168 lb 12.8 oz (76.6 kg)   Past Medical History:  Diagnosis Date   Aortic insufficiency    Coronary artery disease    coronary artery bypass graft x 5 in 1996   Diabetes mellitus    Hypercholesterolemia    non-insulin dependent   Hypertension    Ischemic cardiomyopathy    Mitral regurgitation    PVC (premature ventricular contraction)    Past Surgical History:  Procedure Laterality Date   CORONARY ARTERY BYPASS GRAFT     LUNG SURGERY     OTHER SURGICAL HISTORY     percutaneous coronary intervention of the  posterolateral segment on 01/27/1998    reports that he has never smoked. He has never used smokeless tobacco. He reports that he does not drink alcohol and does not use drugs. family history is not on file. No Known Allergies Current Outpatient Medications on File Prior to Visit  Medication Sig Dispense Refill   albuterol (PROVENTIL HFA;VENTOLIN HFA) 108 (90 Base) MCG/ACT inhaler Inhale 2 puffs into  the lungs every 6 (six) hours as needed for wheezing or shortness of breath. 1 Inhaler 11   aspirin 81 MG tablet Take 81 mg by mouth daily.     azithromycin (ZITHROMAX) 250 MG tablet 2 tab by mouth day 1, then 1 per day 6 tablet 1   doxazosin (CARDURA) 2 MG tablet TAKE 1 TABLET (2 MG TOTAL) BY MOUTH AT BEDTIME. 90 tablet 2   levofloxacin (LEVAQUIN) 250 MG tablet Take 1 tablet (250 mg total) by mouth daily. 10 tablet 0   losartan (COZAAR) 25 MG tablet Take 2 tablets (50 mg total) by mouth daily. 180 tablet 3   losartan (COZAAR) 50 MG tablet Take 50 mg by mouth daily.     metoprolol tartrate (LOPRESSOR) 50 MG tablet TAKE 0.5 TABLET (25 MG TOTAL) BY MOUTH 2X DAILY. KEEP APPT IN NOVEMBER W/ DR. Angelena Form FOR REFILLS. 90 tablet 3   pramipexole (MIRAPEX) 0.25 MG tablet Take 1 tablet (0.25 mg total) by mouth at bedtime. 90 tablet 3   rosuvastatin (CRESTOR) 20 MG tablet TAKE 1 TABLET BY MOUTH DAILY. KEEP UPCOMING APPT IN NOVEMBER WITH DR. Valeria REFILLS. 90 tablet 3   tamsulosin (FLOMAX) 0.4 MG CAPS capsule TAKE 1 CAPSULE BY MOUTH EVERY DAY 90 capsule 1   traZODone (DESYREL) 50 MG tablet TAKE 1/2 TO 1 TABLET BY MOUTH AT BEDTIME AS NEEDED FOR SLEEP 90 tablet 1   vitamin C (ASCORBIC ACID) 500 MG tablet Take 500 mg by mouth daily.  citalopram (CELEXA) 10 MG tablet Take 1 tablet (10 mg total) by mouth daily. 90 tablet 3   furosemide (LASIX) 20 MG tablet Take 1 tablet (20 mg total) by mouth daily as needed (for swelling). 20 tablet 11   No current facility-administered medications on file prior to visit.   Review of Systems All otherwise neg per pt     Objective:   Physical Exam BP 132/60 (BP Location: Right Arm, Patient Position: Sitting, Cuff Size: Large)    Pulse 64    Temp 97.7 F (36.5 C) (Oral)    Ht 5\' 10"  (1.778 m)    Wt 182 lb (82.6 kg)    SpO2 97%    BMI 26.11 kg/m  VS noted,  Constitutional: Pt appears in NAD HENT: Head: NCAT.  Right Ear: External ear normal.    Left Ear: External ear normal.  Eyes: . Pupils are equal, round, and reactive to light. Conjunctivae and EOM are normal Nose: without d/c or deformity Neck: Neck supple. Gross normal ROM Cardiovascular: Normal rate and regular rhythm.   Pulmonary/Chest: Effort normal and breath sounds without rales or wheezing.  Abd:  Soft, NT, ND, + BS, no organomegaly Neurological: Pt is alert. At baseline orientation, motor grossly intact Skin: Skin is warm. No rashes, other new lesions, no LE edema Psychiatric: Pt behavior is normal without agitation  All otherwise neg per pt Lab Results  Component Value Date   WBC 7.7 07/17/2019   HGB 12.4 (L) 07/17/2019   HCT 37.5 (L) 07/17/2019   PLT 223 07/17/2019   GLUCOSE 126 (H) 07/17/2019   CHOL 114 07/17/2019   TRIG 176 (H) 07/17/2019   HDL 45 07/17/2019   LDLCALC 44 07/17/2019   ALT 15 07/17/2019   AST 18 07/17/2019   NA 139 07/17/2019   K 4.3 07/17/2019   CL 105 07/17/2019   CREATININE 1.13 (H) 07/17/2019   BUN 16 07/17/2019   CO2 27 07/17/2019   TSH 3.00 07/17/2019   PSA 1.97 09/06/2017   HGBA1C 6.0 (H) 07/17/2019   MICROALBUR 4.2 07/17/2019      Assessment & Plan:

## 2019-07-17 NOTE — Patient Instructions (Addendum)
You had the Prevnar 13 pneumonia shot today  Please continue all other medications as before, and refills have been done if requested.  Please have the pharmacy call with any other refills you may need.  Please continue your efforts at being more active, low cholesterol diet, and weight control.  You are otherwise up to date with prevention measures today.  Please keep your appointments with your specialists as you may have planned  Please go to the LAB at the blood drawing area for the tests to be done  You will be contacted by phone if any changes need to be made immediately.  Otherwise, you will receive a letter about your results with an explanation, but please check with MyChart first.  Please remember to sign up for MyChart if you have not done so, as this will be important to you in the future with finding out test results, communicating by private email, and scheduling acute appointments online when needed.  Please make an Appointment to return in 6 months, or sooner if needed

## 2019-07-18 ENCOUNTER — Encounter: Payer: Self-pay | Admitting: Internal Medicine

## 2019-07-18 LAB — HEPATIC FUNCTION PANEL
AG Ratio: 1.4 (calc) (ref 1.0–2.5)
ALT: 15 U/L (ref 9–46)
AST: 18 U/L (ref 10–35)
Albumin: 3.8 g/dL (ref 3.6–5.1)
Alkaline phosphatase (APISO): 61 U/L (ref 35–144)
Bilirubin, Direct: 0.1 mg/dL (ref 0.0–0.2)
Globulin: 2.8 g/dL (calc) (ref 1.9–3.7)
Indirect Bilirubin: 0.3 mg/dL (calc) (ref 0.2–1.2)
Total Bilirubin: 0.4 mg/dL (ref 0.2–1.2)
Total Protein: 6.6 g/dL (ref 6.1–8.1)

## 2019-07-18 LAB — URINALYSIS, ROUTINE W REFLEX MICROSCOPIC
Bacteria, UA: NONE SEEN /HPF
Bilirubin Urine: NEGATIVE
Glucose, UA: NEGATIVE
Hgb urine dipstick: NEGATIVE
Hyaline Cast: NONE SEEN /LPF
Ketones, ur: NEGATIVE
Leukocytes,Ua: NEGATIVE
Nitrite: NEGATIVE
RBC / HPF: NONE SEEN /HPF (ref 0–2)
Specific Gravity, Urine: 1.017 (ref 1.001–1.03)
Squamous Epithelial / HPF: NONE SEEN /HPF (ref <=5)
WBC, UA: NONE SEEN /HPF (ref 0–5)
pH: <=5 (ref 5.0–8.0)

## 2019-07-18 LAB — LIPID PANEL
Cholesterol: 114 mg/dL (ref <200)
HDL: 45 mg/dL (ref >=40)
LDL Cholesterol (Calc): 44 mg/dL (calc)
Non-HDL Cholesterol (Calc): 69 mg/dL (calc) (ref <130)
Total CHOL/HDL Ratio: 2.5 (calc) (ref <5.0)
Triglycerides: 176 mg/dL — ABNORMAL HIGH (ref <150)

## 2019-07-18 LAB — BASIC METABOLIC PANEL
BUN/Creatinine Ratio: 14 (calc) (ref 6–22)
BUN: 16 mg/dL (ref 7–25)
CO2: 27 mmol/L (ref 20–32)
Calcium: 9.3 mg/dL (ref 8.6–10.3)
Chloride: 105 mmol/L (ref 98–110)
Creat: 1.13 mg/dL — ABNORMAL HIGH (ref 0.70–1.11)
Glucose, Bld: 126 mg/dL — ABNORMAL HIGH (ref 65–99)
Potassium: 4.3 mmol/L (ref 3.5–5.3)
Sodium: 139 mmol/L (ref 135–146)

## 2019-07-18 LAB — CBC WITH DIFFERENTIAL/PLATELET
Absolute Monocytes: 585 cells/uL (ref 200–950)
Basophils Absolute: 69 cells/uL (ref 0–200)
Basophils Relative: 0.9 %
Eosinophils Absolute: 547 cells/uL — ABNORMAL HIGH (ref 15–500)
Eosinophils Relative: 7.1 %
HCT: 37.5 % — ABNORMAL LOW (ref 38.5–50.0)
Hemoglobin: 12.4 g/dL — ABNORMAL LOW (ref 13.2–17.1)
Lymphs Abs: 2526 cells/uL (ref 850–3900)
MCH: 34.2 pg — ABNORMAL HIGH (ref 27.0–33.0)
MCHC: 33.1 g/dL (ref 32.0–36.0)
MCV: 103.3 fL — ABNORMAL HIGH (ref 80.0–100.0)
MPV: 10.3 fL (ref 7.5–12.5)
Monocytes Relative: 7.6 %
Neutro Abs: 3973 cells/uL (ref 1500–7800)
Neutrophils Relative %: 51.6 %
Platelets: 223 10*3/uL (ref 140–400)
RBC: 3.63 10*6/uL — ABNORMAL LOW (ref 4.20–5.80)
RDW: 12.5 % (ref 11.0–15.0)
Total Lymphocyte: 32.8 %
WBC: 7.7 10*3/uL (ref 3.8–10.8)

## 2019-07-18 LAB — MICROALBUMIN / CREATININE URINE RATIO
Creatinine, Urine: 101 mg/dL (ref 20–320)
Microalb Creat Ratio: 42 mcg/mg creat — ABNORMAL HIGH (ref <30)
Microalb, Ur: 4.2 mg/dL

## 2019-07-18 LAB — VITAMIN D 25 HYDROXY (VIT D DEFICIENCY, FRACTURES): Vit D, 25-Hydroxy: 49 ng/mL (ref 30–100)

## 2019-07-18 LAB — VITAMIN B12: Vitamin B-12: 430 pg/mL (ref 200–1100)

## 2019-07-18 LAB — HEMOGLOBIN A1C
Hgb A1c MFr Bld: 6 % of total Hgb — ABNORMAL HIGH (ref <5.7)
Mean Plasma Glucose: 126 (calc)
eAG (mmol/L): 7 (calc)

## 2019-07-18 LAB — TSH: TSH: 3 mIU/L (ref 0.40–4.50)

## 2019-07-19 ENCOUNTER — Encounter: Payer: Self-pay | Admitting: Internal Medicine

## 2019-07-19 NOTE — Assessment & Plan Note (Signed)
For cont oral replacement

## 2019-07-19 NOTE — Assessment & Plan Note (Signed)
stable overall by history and exam, recent data reviewed with pt, and pt to continue medical treatment as before,  to f/u any worsening symptoms or concerns  

## 2019-07-19 NOTE — Assessment & Plan Note (Signed)

## 2019-09-17 ENCOUNTER — Telehealth: Payer: Self-pay | Admitting: Cardiovascular Disease

## 2019-09-17 MED ORDER — FUROSEMIDE 20 MG PO TABS: 20.0000 mg | ORAL_TABLET | Freq: Every day | ORAL | 6 refills | Status: DC | PRN

## 2019-09-17 NOTE — Telephone Encounter (Signed)
     Pt c/o swelling: STAT is pt has developed SOB within 24 hours  1) How much weight have you gained and in what time span?   2) If swelling, where is the swelling located? Legs and ankles  3) Are you currently taking a fluid pill? Not sure  4) Are you currently SOB? Yes but always been sob  5) Do you have a log of your daily weights (if so, list)?   6) Have you gained 3 pounds in a day or 5 pounds in a week? Not sure  7) Have you traveled recently? No  Pt's wife called, she made appt for pt's recall and she said pt's edema is getting worst. She also feel like his SOB is getting worst specially when laying down. She just wanted to check in with Dr. Angelena Form if he needs to be seen sooner.

## 2019-09-17 NOTE — Telephone Encounter (Signed)
Spoke with patient's wife, patient in background speaking. He has run out of the lasix prescription that he had. Per last ov note w Dr. Angelena Form, continue lasix 20 mg daily as needed.  Adv to pick up lasix and take daily x 3 days to see if symptoms improve.  After that go back to only as needed.  Adv her to call back on Monday if symptoms do not improve.    She thanked me for call and will pick up the new lasix prescription today.

## 2019-11-17 ENCOUNTER — Other Ambulatory Visit: Payer: Self-pay | Admitting: Internal Medicine

## 2019-11-17 ENCOUNTER — Ambulatory Visit: Payer: Medicare HMO | Admitting: Physician Assistant

## 2019-11-19 ENCOUNTER — Ambulatory Visit: Payer: Medicare HMO | Admitting: Physician Assistant

## 2019-11-19 ENCOUNTER — Encounter: Payer: Self-pay | Admitting: Physician Assistant

## 2019-11-19 ENCOUNTER — Other Ambulatory Visit: Payer: Self-pay

## 2019-11-19 VITALS — BP 162/60 | HR 55 | Ht 70.0 in | Wt 180.2 lb

## 2019-11-19 DIAGNOSIS — E7800 Pure hypercholesterolemia, unspecified: Secondary | ICD-10-CM | POA: Diagnosis not present

## 2019-11-19 DIAGNOSIS — I447 Left bundle-branch block, unspecified: Secondary | ICD-10-CM | POA: Diagnosis not present

## 2019-11-19 DIAGNOSIS — I5022 Chronic systolic (congestive) heart failure: Secondary | ICD-10-CM

## 2019-11-19 DIAGNOSIS — I10 Essential (primary) hypertension: Secondary | ICD-10-CM | POA: Diagnosis not present

## 2019-11-19 DIAGNOSIS — I255 Ischemic cardiomyopathy: Secondary | ICD-10-CM

## 2019-11-19 DIAGNOSIS — I2510 Atherosclerotic heart disease of native coronary artery without angina pectoris: Secondary | ICD-10-CM | POA: Diagnosis not present

## 2019-11-19 MED ORDER — LOSARTAN POTASSIUM 50 MG PO TABS
75.0000 mg | ORAL_TABLET | Freq: Every day | ORAL | 3 refills | Status: DC
Start: 2019-11-19 — End: 2019-12-25

## 2019-11-19 NOTE — Patient Instructions (Addendum)
Medication Instructions:  Your physician has recommended you make the following change in your medication:  1.  INCREASE the Losartan to 50 mg taking 1 & 1/2 tablet daily.  Monitor your blood pressure daily, keep a log.   *If you need a refill on your cardiac medications before your next appointment, please call your pharmacy*   Lab Work: 12/07/19:  Come anytime after 7:30 and before 3:30 for BMET.  Bring a list of your blood pressure readings with you and leave them for Vin Bhagat, PA-C to view.   If you have labs (blood work) drawn today and your tests are completely normal, you will receive your results only by: Marland Kitchen MyChart Message (if you have MyChart) OR . A paper copy in the mail If you have any lab test that is abnormal or we need to change your treatment, we will call you to review the results.   Testing/Procedures: None ordered   Follow-Up: At [facility], you and your health needs are our priority.  As part of our continuing mission to provide you with exceptional heart care, we have created designated Provider Care Teams.  These Care Teams include your primary Cardiologist (physician) and Advanced Practice Providers (APPs -  Physician Assistants and Nurse Practitioners) who all work together to provide you with the care you need, when you need it.  We recommend signing up for the patient portal called "MyChart".  Sign up information is provided on this After Visit Summary.  MyChart is used to connect with patients for Virtual Visits (Telemedicine).  Patients are able to view lab/test results, encounter notes, upcoming appointments, etc.  Non-urgent messages can be sent to your provider as well.   To learn more about what you can do with MyChart, go to [email or web address].    Your next appointment:   12 month(s)  The format for your next appointment:   In Person  Provider:   You may see Lauree Chandler, MD or one of the following Advanced Practice Providers on  your designated Care Team:    Melina Copa, PA-C  Ermalinda Barrios, PA-C    Other Instructions

## 2019-11-19 NOTE — Progress Notes (Signed)
Cardiology Office Note:    Date:  11/19/2019   ID:  REMIJIO Kerr, DOB [birth year], MRN [id number]  PCP:  Biagio Borg, MD  [facility] Cardiologist:  Lauree Chandler, MD  [place] Electrophysiologist:  None   Chief Complaint: 12 months follow up for CAD  History of Present Illness:    Philip Kerr is a 86 y.o. male with a hx of CAD s/p 5V CABG 1996, ischemic cardiomyopathy, HTN, HLD, DM, chronic LBBB mitral regurgitation, aortic valve insufficiency, left lobectomy as a teenager and PVCs seen for yearly follow up.   Echo October 2015 with LVEF=35=40%, moderate LVH, trivial AI, moderate MR. Stress myoview May 2014 without ischemia, lateral scar. Cardiac monitor 2015 with PVCs. He did not tolerate higher doses of beta blockers. He has been reluctant to consider cardiac testing and has refused to consider an ICD. He was seen in March 2019 by Truitt Merle, NP and described a syncopal event at home. He refused any workup. Echo September 2019 with LVEF=25-30%, severe LVH. Mild AI, moderate MR.   Last seen by Dr. Angelena Form 11/2018.  Here today for follow up. No complains. Reports elevated BP in 170-80s range for past 2 months. Does 80 pushups/day. The patient denies nausea, vomiting, fever, chest pain, palpitations, shortness of breath, orthopnea, PND, dizziness, syncope, cough, congestion, abdominal pain, hematochezia, melena, lower extremity edema.   Past Medical History:  Diagnosis Date  . Aortic insufficiency   . Coronary artery disease    coronary artery bypass graft x 5 in 1996  . Diabetes mellitus   . Hypercholesterolemia    non-insulin dependent  . Hypertension   . Ischemic cardiomyopathy   . Mitral regurgitation   . PVC (premature ventricular contraction)     Past Surgical History:  Procedure Laterality Date  . CORONARY ARTERY BYPASS GRAFT    . LUNG SURGERY    . OTHER SURGICAL HISTORY     percutaneous coronary intervention of the  posterolateral  segment on 01/27/1998    Current Medications: Current Meds  Medication Sig  . albuterol (PROVENTIL HFA;VENTOLIN HFA) 108 (90 Base) MCG/ACT inhaler Inhale 2 puffs into the lungs every 6 (six) hours as needed for wheezing or shortness of breath.  Marland Kitchen aspirin 81 MG tablet Take 81 mg by mouth daily.  Marland Kitchen azithromycin (ZITHROMAX) 250 MG tablet 2 tab by mouth day 1, then 1 per day  . doxazosin (CARDURA) 2 MG tablet TAKE 1 TABLET (2 MG TOTAL) BY MOUTH AT BEDTIME.  . furosemide (LASIX) 20 MG tablet Take 1 tablet (20 mg total) by mouth daily as needed (for swelling).  Marland Kitchen levofloxacin (LEVAQUIN) 250 MG tablet Take 1 tablet (250 mg total) by mouth daily.  . metoprolol tartrate (LOPRESSOR) 50 MG tablet TAKE 0.5 TABLET (25 MG TOTAL) BY MOUTH 2X DAILY. KEEP APPT IN NOVEMBER W/ DR. Angelena Form FOR REFILLS.  Marland Kitchen pramipexole (MIRAPEX) 0.25 MG tablet Take 1 tablet (0.25 mg total) by mouth at bedtime.  . rosuvastatin (CRESTOR) 20 MG tablet TAKE 1 TABLET BY MOUTH DAILY. KEEP UPCOMING APPT IN NOVEMBER WITH DR. [place] REFILLS.  Marland Kitchen tamsulosin (FLOMAX) 0.4 MG CAPS capsule TAKE 1 CAPSULE BY MOUTH EVERY DAY  . traZODone (DESYREL) 50 MG tablet TAKE 1/2 TO 1 TABLET BY MOUTH AT BEDTIME AS NEEDED FOR SLEEP  . vitamin C (ASCORBIC ACID) 500 MG tablet Take 500 mg by mouth daily.  . [DISCONTINUED] losartan (COZAAR) 50 MG tablet Take 50 mg by mouth daily.  Allergies:   Patient has no known allergies.   Social History   Socioeconomic History  . Marital status: Married    Spouse name: Not on file  . Number of children: 1  . Years of education: Not on file  . Highest education level: Not on file  Occupational History  . Occupation: Runs a Acupuncturist: SELF-EMPLOYED  Tobacco Use  . Smoking status: Never Smoker  . Smokeless tobacco: Never Used  Vaping Use  . Vaping Use: Never used  Substance and Sexual Activity  . Alcohol use: No  . Drug use: No  . Sexual activity: Not Currently  Other Topics  Concern  . Not on file  Social History Narrative  . Not on file   Social Determinants of Health   Financial Resource Strain:   . Difficulty of Paying Living Expenses: Not on file  Food Insecurity:   . Worried About Charity fundraiser in the Last Year: Not on file  . Ran Out of Food in the Last Year: Not on file  Transportation Needs:   . Lack of Transportation (Medical): Not on file  . Lack of Transportation (Non-Medical): Not on file  Physical Activity:   . Days of Exercise per Week: Not on file  . Minutes of Exercise per Session: Not on file  Stress:   . Feeling of Stress : Not on file  Social Connections:   . Frequency of Communication with Friends and Family: Not on file  . Frequency of Social Gatherings with Friends and Family: Not on file  . Attends Religious Services: Not on file  . Active Member of Clubs or Organizations: Not on file  . Attends Archivist Meetings: Not on file  . Marital Status: Not on file     Family History: The patient's family history is negative for CAD.   ROS:   Please see the history of present illness.    All other systems reviewed and are negative.   EKGs/Labs/Other Studies Reviewed:    The following studies were reviewed today:  Echo 09/2017 Study Conclusions   - Left ventricle: The cavity size was moderately dilated. Wall  thickness was increased in a pattern of severe LVH. Systolic  function was severely reduced. The estimated ejection fraction  was in the range of 25% to 30%. Diffuse hypokinesis. Left  ventricular diastolic function parameters were normal.  - Aortic valve: There was mild regurgitation.  - Mitral valve: There was moderate regurgitation.  - Left atrium: The atrium was moderately dilated.  - Atrial septum: No defect or patent foramen ovale was identified.   EKG:  EKG is ordered today.  The ekg ordered today demonstrates sinus bradycardia, LBBB, PAC  Recent Labs: 07/17/2019: ALT 15; BUN 16;  Creat 1.13; Hemoglobin 12.4; Platelets 223; Potassium 4.3; Sodium 139; TSH 3.00  Recent Lipid Panel    Component Value Date/Time   CHOL 114 07/17/2019 1022   CHOL 98 (L) 12/01/2018 0808   TRIG 176 (H) 07/17/2019 1022   TRIG 102 10/22/2005 0730   HDL 45 07/17/2019 1022   HDL 41 12/01/2018 0808   CHOLHDL 2.5 07/17/2019 1022   VLDL 34.0 09/06/2017 1024   LDLCALC 44 07/17/2019 1022     Physical Exam:    VS:  BP (!) 162/60   Pulse (!) 55   Ht 5\' 10"  (1.778 m)   Wt 180 lb 3.2 oz (81.7 kg)   SpO2 97%   BMI 25.86 kg/m  Wt Readings from Last 3 Encounters:  11/19/19 180 lb 3.2 oz (81.7 kg)  07/17/19 182 lb (82.6 kg)  05/29/19 180 lb (81.6 kg)     GEN: Well nourished, well developed in no acute distress HEENT: Normal NECK: No JVD; No carotid bruits LYMPHATICS: No lymphadenopathy CARDIAC: RRR, no murmurs, rubs, gallops RESPIRATORY:  Clear to auscultation without rales, wheezing or rhonchi  ABDOMEN: Soft, non-tender, non-distended MUSCULOSKELETAL:  No edema; No deformity  SKIN: Warm and dry NEUROLOGIC:  Alert and oriented x 3 PSYCHIATRIC:  Normal affect   ASSESSMENT AND PLAN:    1. CAD s/p remote CABG - No angina. Continue ASA, statin and BB  2. Chronic systolic CHF/ICM - Last echo 09/2017 showed LVEF of 25-30% (LVEF 35-40% in 2015; LVEF 35% in 2014_ - He has declined ICD in past - Continue BB and ARB (see below) - Euvolemic - Has PRN lasix but never required to use  3. HTN - Elevated. Continue metoprolol 25mg  BID (would not increase due to baseline bradycardia). Increase losartan to 75mg  qd. Keep log and bring when come for BMET in 2 weeks.   4. Mitral regurgitations - Moderate by echo in 2019 - Follow clinically, not interested in intervention   5. HLD - 07/17/2019: Cholesterol 114; HDL 45; LDL Cholesterol (Calc) 44; Triglycerides 176  - Continue Crestor 20mg  qd   Medication Adjustments/Labs and Tests Ordered: Current medicines are reviewed at length with the  patient today.  Concerns regarding medicines are outlined above.  Orders Placed This Encounter  Procedures  . Basic metabolic panel  . EKG 12-Lead   Meds ordered this encounter  Medications  . losartan (COZAAR) 50 MG tablet    Sig: Take 1.5 tablets (75 mg total) by mouth daily.    Dispense:  135 tablet    Refill:  3    Patient Instructions  Medication Instructions:  Your physician has recommended you make the following change in your medication:  1.  INCREASE the Losartan to 50 mg taking 1 & 1/2 tablet daily.  Monitor your blood pressure daily, keep a log.   *If you need a refill on your cardiac medications before your next appointment, please call your pharmacy*   Lab Work: 12/07/19:  Come anytime after 7:30 and before 3:30 for BMET.  Bring a list of your blood pressure readings with you and leave them for Vin Sahra Converse, PA-C to view.   If you have labs (blood work) drawn today and your tests are completely normal, you will receive your results only by: Marland Kitchen MyChart Message (if you have MyChart) OR . A paper copy in the mail If you have any lab test that is abnormal or we need to change your treatment, we will call you to review the results.   Testing/Procedures: None ordered   Follow-Up: At [facility], you and your health needs are our priority.  As part of our continuing mission to provide you with exceptional heart care, we have created designated Provider Care Teams.  These Care Teams include your primary Cardiologist (physician) and Advanced Practice Providers (APPs -  Physician Assistants and Nurse Practitioners) who all work together to provide you with the care you need, when you need it.  We recommend signing up for the patient portal called "MyChart".  Sign up information is provided on this After Visit Summary.  MyChart is used to connect with patients for Virtual Visits (Telemedicine).  Patients are able to view lab/test results, encounter notes, upcoming  appointments, etc.  Non-urgent messages can be sent to your provider as well.   To learn more about what you can do with MyChart, go to [email or web address].    Your next appointment:   12 month(s)  The format for your next appointment:   In Person  Provider:   You may see Lauree Chandler, MD or one of the following Advanced Practice Providers on your designated Care Team:    Melina Copa, PA-C  Ermalinda Barrios, PA-C    Other Instructions      Signed, Leanor Kail, [place]  11/19/2019 10:10 AM    [place]

## 2019-12-02 ENCOUNTER — Ambulatory Visit: Payer: Medicare HMO | Admitting: Physician Assistant

## 2019-12-07 ENCOUNTER — Other Ambulatory Visit: Payer: Medicare HMO | Admitting: *Deleted

## 2019-12-07 DIAGNOSIS — I255 Ischemic cardiomyopathy: Secondary | ICD-10-CM

## 2019-12-07 DIAGNOSIS — I10 Essential (primary) hypertension: Secondary | ICD-10-CM | POA: Diagnosis not present

## 2019-12-07 DIAGNOSIS — E7800 Pure hypercholesterolemia, unspecified: Secondary | ICD-10-CM | POA: Diagnosis not present

## 2019-12-07 DIAGNOSIS — I2510 Atherosclerotic heart disease of native coronary artery without angina pectoris: Secondary | ICD-10-CM | POA: Diagnosis not present

## 2019-12-07 DIAGNOSIS — I447 Left bundle-branch block, unspecified: Secondary | ICD-10-CM | POA: Diagnosis not present

## 2019-12-07 DIAGNOSIS — I5022 Chronic systolic (congestive) heart failure: Secondary | ICD-10-CM

## 2019-12-07 LAB — BASIC METABOLIC PANEL
BUN/Creatinine Ratio: 20 (ref 10–24)
BUN: 26 mg/dL (ref 8–27)
CO2: 25 mmol/L (ref 20–29)
Calcium: 9.3 mg/dL (ref 8.6–10.2)
Chloride: 103 mmol/L (ref 96–106)
Creatinine, Ser: 1.28 mg/dL — ABNORMAL HIGH (ref 0.76–1.27)
GFR calc Af Amer: 59 mL/min/{1.73_m2} — ABNORMAL LOW (ref >59)
GFR calc non Af Amer: 51 mL/min/{1.73_m2} — ABNORMAL LOW (ref >59)
Glucose: 136 mg/dL — ABNORMAL HIGH (ref 65–99)
Potassium: 5.2 mmol/L (ref 3.5–5.2)
Sodium: 138 mmol/L (ref 134–144)

## 2019-12-08 ENCOUNTER — Telehealth: Payer: Self-pay | Admitting: *Deleted

## 2019-12-08 DIAGNOSIS — E876 Hypokalemia: Secondary | ICD-10-CM

## 2019-12-08 NOTE — Telephone Encounter (Signed)
-----   Message from [place], [place] sent at 12/08/2019  8:59 AM EST ----- Yes likely medications induced hyperkalemia then. Recheck BMET Monday for close follow up.  ----- Message ----- From: Jeanann Lewandowsky, RMA Sent: 12/08/2019   8:26 AM EST To: Leanor Kail, PA  Pt has been made aware of his lab results.  He advised to his knowledge, he don't eat foods that are high in potassium. He is on the Losartan-Potassium medication. Can that be causing?What to do?

## 2019-12-14 ENCOUNTER — Other Ambulatory Visit: Payer: Self-pay

## 2019-12-14 ENCOUNTER — Other Ambulatory Visit: Payer: Medicare HMO | Admitting: *Deleted

## 2019-12-14 DIAGNOSIS — E876 Hypokalemia: Secondary | ICD-10-CM | POA: Diagnosis not present

## 2019-12-16 LAB — BASIC METABOLIC PANEL
BUN/Creatinine Ratio: 21 (ref 10–24)
BUN: 27 mg/dL (ref 8–27)
CO2: 20 mmol/L (ref 20–29)
Calcium: 8.6 mg/dL (ref 8.6–10.2)
Chloride: 103 mmol/L (ref 96–106)
Creatinine, Ser: 1.28 mg/dL — ABNORMAL HIGH (ref 0.76–1.27)
GFR calc Af Amer: 59 mL/min/{1.73_m2} — ABNORMAL LOW (ref >59)
GFR calc non Af Amer: 51 mL/min/{1.73_m2} — ABNORMAL LOW (ref >59)
Glucose: 136 mg/dL — ABNORMAL HIGH (ref 65–99)
Potassium: 4.3 mmol/L (ref 3.5–5.2)
Sodium: 140 mmol/L (ref 134–144)

## 2019-12-17 ENCOUNTER — Telehealth: Payer: Self-pay | Admitting: *Deleted

## 2019-12-17 MED ORDER — AMLODIPINE BESYLATE 5 MG PO TABS
5.0000 mg | ORAL_TABLET | Freq: Every day | ORAL | 3 refills | Status: DC
Start: 2019-12-17 — End: 2020-08-08

## 2019-12-17 NOTE — Telephone Encounter (Signed)
Philip Blanks, MD  Philip Key, RN Philip Kerr brought by his BP log. BP still higher at times than I would like. Can we see if he would be willing to start Norvasc 5 mg daily? I would not want to increase his Lopressor due to his bradycardia at times and would not want to increase the Losartan further due to his chronic kidney disease. [name]   i spoke with the patient.  He said his readings are coming down but while on the phone -right after carrying in groceries, BP is 176/105.  He agrees to take Norvasc 5 mg daily.  Will continue to keep track of BPs.

## 2019-12-21 ENCOUNTER — Other Ambulatory Visit: Payer: Self-pay

## 2019-12-22 ENCOUNTER — Ambulatory Visit (INDEPENDENT_AMBULATORY_CARE_PROVIDER_SITE_OTHER): Payer: Medicare HMO | Admitting: Internal Medicine

## 2019-12-22 ENCOUNTER — Encounter: Payer: Self-pay | Admitting: Internal Medicine

## 2019-12-22 VITALS — BP 142/58 | HR 56 | Temp 98.1°F | Ht 70.0 in | Wt 179.0 lb

## 2019-12-22 DIAGNOSIS — R21 Rash and other nonspecific skin eruption: Secondary | ICD-10-CM

## 2019-12-22 DIAGNOSIS — E7800 Pure hypercholesterolemia, unspecified: Secondary | ICD-10-CM

## 2019-12-22 DIAGNOSIS — E559 Vitamin D deficiency, unspecified: Secondary | ICD-10-CM | POA: Diagnosis not present

## 2019-12-22 DIAGNOSIS — F0391 Unspecified dementia with behavioral disturbance: Secondary | ICD-10-CM | POA: Diagnosis not present

## 2019-12-22 DIAGNOSIS — N1831 Chronic kidney disease, stage 3a: Secondary | ICD-10-CM | POA: Diagnosis not present

## 2019-12-22 DIAGNOSIS — I10 Essential (primary) hypertension: Secondary | ICD-10-CM | POA: Diagnosis not present

## 2019-12-22 DIAGNOSIS — E1165 Type 2 diabetes mellitus with hyperglycemia: Secondary | ICD-10-CM

## 2019-12-22 LAB — BASIC METABOLIC PANEL
BUN: 29 mg/dL — ABNORMAL HIGH (ref 6–23)
CO2: 26 mEq/L (ref 19–32)
Calcium: 9.1 mg/dL (ref 8.4–10.5)
Chloride: 104 mEq/L (ref 96–112)
Creatinine, Ser: 1.45 mg/dL (ref 0.40–1.50)
GFR: 44.34 mL/min — ABNORMAL LOW (ref >60.00)
Glucose, Bld: 112 mg/dL — ABNORMAL HIGH (ref 70–99)
Potassium: 4.2 mEq/L (ref 3.5–5.1)
Sodium: 137 mEq/L (ref 135–145)

## 2019-12-22 LAB — LIPID PANEL
Cholesterol: 103 mg/dL (ref 0–200)
HDL: 47.4 mg/dL (ref >39.00)
LDL Cholesterol: 18 mg/dL (ref 0–99)
NonHDL: 55.29
Total CHOL/HDL Ratio: 2
Triglycerides: 184 mg/dL — ABNORMAL HIGH (ref 0.0–149.0)
VLDL: 36.8 mg/dL (ref 0.0–40.0)

## 2019-12-22 LAB — HEPATIC FUNCTION PANEL
ALT: 18 U/L (ref 0–53)
AST: 17 U/L (ref 0–37)
Albumin: 3.6 g/dL (ref 3.5–5.2)
Alkaline Phosphatase: 58 U/L (ref 39–117)
Bilirubin, Direct: 0.1 mg/dL (ref 0.0–0.3)
Total Bilirubin: 0.3 mg/dL (ref 0.2–1.2)
Total Protein: 6.7 g/dL (ref 6.0–8.3)

## 2019-12-22 LAB — HEMOGLOBIN A1C: Hgb A1c MFr Bld: 6.5 % (ref 4.6–6.5)

## 2019-12-22 MED ORDER — PREDNISONE 10 MG PO TABS: ORAL_TABLET | ORAL | 0 refills | Status: DC

## 2019-12-22 MED ORDER — QUETIAPINE FUMARATE 50 MG PO TABS: 50.0000 mg | ORAL_TABLET | Freq: Every day | ORAL | 3 refills | Status: DC

## 2019-12-22 NOTE — Progress Notes (Deleted)
   Subjective:    Patient ID: Philip Kerr, male    DOB: [birth year], 86 y.o.   MRN: [id number]  HPI    Review of Systems     Objective:   Physical Exam    Lab Results  Component Value Date   WBC 7.7 07/17/2019   HGB 12.4 (L) 07/17/2019   HCT 37.5 (L) 07/17/2019   PLT 223 07/17/2019   GLUCOSE 136 (H) 12/14/2019   CHOL 114 07/17/2019   TRIG 176 (H) 07/17/2019   HDL 45 07/17/2019   LDLCALC 44 07/17/2019   ALT 15 07/17/2019   AST 18 07/17/2019   NA 140 12/14/2019   K 4.3 12/14/2019   CL 103 12/14/2019   CREATININE 1.28 (H) 12/14/2019   BUN 27 12/14/2019   CO2 20 12/14/2019   TSH 3.00 07/17/2019   PSA 1.97 09/06/2017   HGBA1C 6.0 (H) 07/17/2019   MICROALBUR 4.2 07/17/2019       Assessment & Plan:

## 2019-12-22 NOTE — Patient Instructions (Addendum)
Please take all new medication as prescribed - the very low dose prednisone for a few days for the rash  Please take all new medication as prescribed - the seroquel for bedtime  Please continue all other medications as before, and refills have been done if requested.  Please have the pharmacy call with any other refills you may need.  Please continue your efforts at being more active, low cholesterol diet, and weight control  Please keep your appointments with your specialists as you may have planned  Please go to the LAB at the blood drawing area for the tests to be done  You will be contacted by phone if any changes need to be made immediately.  Otherwise, you will receive a letter about your results with an explanation, but please check with MyChart first.  Please remember to sign up for MyChart if you have not done so, as this will be important to you in the future with finding out test results, communicating by private email, and scheduling acute appointments online when needed.  Please make an Appointment to return in 6 months, or sooner if needed

## 2019-12-24 ENCOUNTER — Encounter: Payer: Self-pay | Admitting: Internal Medicine

## 2019-12-24 DIAGNOSIS — N1831 Chronic kidney disease, stage 3a: Secondary | ICD-10-CM | POA: Insufficient documentation

## 2019-12-25 ENCOUNTER — Other Ambulatory Visit: Payer: Self-pay | Admitting: Cardiovascular Disease

## 2019-12-29 ENCOUNTER — Ambulatory Visit: Payer: Medicare HMO | Admitting: Internal Medicine

## 2020-01-07 ENCOUNTER — Encounter: Payer: Self-pay | Admitting: Internal Medicine

## 2020-01-07 NOTE — Assessment & Plan Note (Signed)
Cont oral replacement  Last vitamin D Lab Results  Component Value Date   VD25OH 49 07/17/2019

## 2020-01-07 NOTE — Assessment & Plan Note (Signed)
Lab Results  Component Value Date   LDLCALC 18 12/22/2019   stable overall by history and exam, recent data reviewed with pt, and pt to continue medical treatment as before,  to f/u any worsening symptoms or concerns

## 2020-01-07 NOTE — Assessment & Plan Note (Signed)
BP Readings from Last 3 Encounters:  12/22/19 (!) 142/58  11/19/19 (!) 162/60  07/17/19 132/60  mild elevated, declines med change today

## 2020-01-07 NOTE — Progress Notes (Signed)
Established Patient Office Visit  Subjective:  Patient ID: Philip Kerr, male    DOB: August 02, 1933  Age: 86 y.o. MRN: [id number]      Chief Complaint: (concise statement describing the symptom, problem, condition, diagnosis, physician recommended return, or other factor as reason for encounter) follow up HTN, HLD and hyperglycemia , ckd, vit d def       HPI:  Philip Kerr is a 86 y.o. male here to f/u; overall doing ok,  Pt denies chest pain, increasing sob or doe, wheezing, orthopnea, PND, increased LE swelling, palpitations, dizziness or syncope.  Pt denies new neurological symptoms such as new headache, or facial or extremity weakness or numbness.  Pt denies polydipsia, polyuria, or symptomatic low sugars. Pt states overall good compliance with meds, mostly trying to follow appropriate diet, with wt overall stable,  but little exercise however. Also has new itchy rash to bilat upper back left > right, non painful non ulcerated, no fever.  Dementia overall stable symptomatically, and not assoc with behavioral changes such as hallucinations, paranoia, or agitation, except for night time behaviors that sometimes wake her up. .       Wt Readings from Last 3 Encounters:  12/22/19 179 lb (81.2 kg)  11/19/19 180 lb 3.2 oz (81.7 kg)  07/17/19 182 lb (82.6 kg)   BP Readings from Last 3 Encounters:  12/22/19 (!) 142/58  11/19/19 (!) 162/60  07/17/19 132/60         Past Medical History:  Diagnosis Date  . Aortic insufficiency   . Coronary artery disease    coronary artery bypass graft x 5 in 1996  . Diabetes mellitus   . Hypercholesterolemia    non-insulin dependent  . Hypertension   . Ischemic cardiomyopathy   . Mitral regurgitation   . PVC (premature ventricular contraction)    Past Surgical History:  Procedure Laterality Date  . CORONARY ARTERY BYPASS GRAFT    . LUNG SURGERY    . OTHER SURGICAL HISTORY     percutaneous coronary intervention of the  posterolateral segment on  01/27/1998    reports that he has never smoked. He has never used smokeless tobacco. He reports that he does not drink alcohol and does not use drugs. family history is not on file. No Known Allergies Current Outpatient Medications on File Prior to Visit  Medication Sig Dispense Refill  . albuterol (PROVENTIL HFA;VENTOLIN HFA) 108 (90 Base) MCG/ACT inhaler Inhale 2 puffs into the lungs every 6 (six) hours as needed for wheezing or shortness of breath. 1 Inhaler 11  . amLODipine (NORVASC) 5 MG tablet Take 1 tablet (5 mg total) by mouth daily. 90 tablet 3  . aspirin 81 MG tablet Take 81 mg by mouth daily.    Marland Kitchen doxazosin (CARDURA) 2 MG tablet TAKE 1 TABLET (2 MG TOTAL) BY MOUTH AT BEDTIME. 90 tablet 2  . furosemide (LASIX) 20 MG tablet Take 1 tablet (20 mg total) by mouth daily as needed (for swelling). 30 tablet 6  . pramipexole (MIRAPEX) 0.25 MG tablet Take 1 tablet (0.25 mg total) by mouth at bedtime. 90 tablet 3  . tamsulosin (FLOMAX) 0.4 MG CAPS capsule TAKE 1 CAPSULE BY MOUTH EVERY DAY 90 capsule 1  . traZODone (DESYREL) 50 MG tablet TAKE 1/2 TO 1 TABLET BY MOUTH AT BEDTIME AS NEEDED FOR SLEEP 90 tablet 1  . vitamin C (ASCORBIC ACID) 500 MG tablet Take 500 mg by mouth daily.    . citalopram (CELEXA) 10  MG tablet Take 1 tablet (10 mg total) by mouth daily. 90 tablet 3   No current facility-administered medications on file prior to visit.        ROS:  All others reviewed and negative.  Objective        PE:  BP (!) 142/58 (BP Location: Left Arm, Patient Position: Sitting, Cuff Size: Large)   Pulse (!) 56   Temp 98.1 F (36.7 C) (Oral)   Ht 5\' 10"  (1.778 m)   Wt 179 lb (81.2 kg)   SpO2 98%   BMI 25.68 kg/m                 Constitutional: Pt appears in NAD               HENT: Head: NCAT.                Right Ear: External ear normal.                 Left Ear: External ear normal.                Eyes: . Pupils are equal, round, and reactive to light. Conjunctivae and EOM are  normal               Nose: without d/c or deformity               Neck: Neck supple. Gross normal ROM               Cardiovascular: Normal rate and regular rhythm.                 Pulmonary/Chest: Effort normal and breath sounds without rales or wheezing.                Abd:  Soft, NT, ND, + BS, no organomegaly               Neurological: Pt is alert. At baseline orientation, motor grossly intact               Skin: Skin is warm. + itchy scaly oval no raised nontender erythem rashes to bilat upper back left > right, no other new lesions, LE edema - none               Psychiatric: Pt behavior is normal without agitation   Assessment/Plan:  Philip Kerr is a 85 y.o. White or Caucasian [1] male with  has a past medical history of Aortic insufficiency, Coronary artery disease, Diabetes mellitus, Hypercholesterolemia, Hypertension, Ischemic cardiomyopathy, Mitral regurgitation, and PVC (premature ventricular contraction).   Assessment Plan  See problem oriented assessment and plan Labs reviewed for each problem: Lab Results  Component Value Date   WBC 7.7 07/17/2019   HGB 12.4 (L) 07/17/2019   HCT 37.5 (L) 07/17/2019   PLT 223 07/17/2019   GLUCOSE 112 (H) 12/22/2019   CHOL 103 12/22/2019   TRIG 184.0 (H) 12/22/2019   HDL 47.40 12/22/2019   LDLCALC 18 12/22/2019   ALT 18 12/22/2019   AST 17 12/22/2019   NA 137 12/22/2019   K 4.2 12/22/2019   CL 104 12/22/2019   CREATININE 1.45 12/22/2019   BUN 29 (H) 12/22/2019   CO2 26 12/22/2019   TSH 3.00 07/17/2019   PSA 1.97 09/06/2017   HGBA1C 6.5 12/22/2019   MICROALBUR 4.2 07/17/2019    Micro: none  Cardiac tracings I have personally interpreted today:  none  Pertinent Radiological findings (summarize): none  I spent total 42 minutes in caring for the patient for this visit:  1) by communicating with the patient and family/caregiver - wife present during the visit  2) by review of pertinent vital sign data, physical  examination and labs as documented in the assessment and plan  3) by review of pertinent imaging - none today  4) by review of pertinent procedures - none today  5) by obtaining and reviewing separately obtained information from family/caretaker and Care Everywhere - none today  6) by ordering medications  7) by ordering tests  8) by documenting all of this clinical information in the EHR including the management of each problem noted today in assessment and plan   Health Maintenance Due  Topic Date Due  . OPHTHALMOLOGY EXAM  Never done  . INFLUENZA VACCINE  08/09/2019  . COVID-19 Vaccine (2 - Pfizer 3-dose booster series) 09/06/2019    There are no preventive care reminders to display for this patient.   Problem List Items Addressed This Visit      High   Dementia with behavioral disturbance (HCC)    Mild night time behaviors, for seroquel 50 qhs      Relevant Medications   QUEtiapine (SEROQUEL) 50 MG tablet     Medium   Vitamin D deficiency    Cont oral replacement  Last vitamin D Lab Results  Component Value Date   VD25OH 52 07/17/2019        HYPERCHOLESTEROLEMIA    Lab Results  Component Value Date   LDLCALC 18 12/22/2019   stable overall by history and exam, recent data reviewed with pt, and pt to continue medical treatment as before,  to f/u any worsening symptoms or concerns       Essential hypertension    BP Readings from Last 3 Encounters:  12/22/19 (!) 142/58  11/19/19 (!) 162/60  07/17/19 132/60  mild elevated, declines med change today      Diabetes ([place]) - Primary    Lab Results  Component Value Date   HGBA1C 6.5 12/22/2019   stable overall by history and exam, recent data reviewed with pt, and pt to continue medical treatment as before,  to f/u any worsening symptoms or concerns       Relevant Orders   Hemoglobin A1c (Completed)   Lipid panel (Completed)   Hepatic function panel (Completed)   Basic metabolic panel (Completed)    CKD (chronic kidney disease) stage 3, GFR 30-59 ml/min (HCC)    Lab Results  Component Value Date   CREATININE 1.45 12/22/2019   stable overall by history and exam, recent data reviewed with pt, and pt to continue medical treatment as before,  to f/u any worsening symptoms or concerns        Low   Rash    C/w inflammatory dermatitis - for predpac asd         Meds ordered this encounter  Medications  . predniSONE (DELTASONE) 10 MG tablet    Sig: 1 tabs by mouth per day for 5 days    Dispense:  5 tablet    Refill:  0  . QUEtiapine (SEROQUEL) 50 MG tablet    Sig: Take 1 tablet (50 mg total) by mouth at bedtime.    Dispense:  90 tablet    Refill:  3    Follow-up: Return in about 6 months (around 06/21/2020).     Cathlean Cower, MD 01/07/2020 7:51 PM [place]  [facility]

## 2020-01-07 NOTE — Assessment & Plan Note (Signed)
Lab Results  Component Value Date   HGBA1C 6.5 12/22/2019   stable overall by history and exam, recent data reviewed with pt, and pt to continue medical treatment as before,  to f/u any worsening symptoms or concerns

## 2020-01-07 NOTE — Assessment & Plan Note (Signed)
Lab Results  Component Value Date   CREATININE 1.45 12/22/2019   stable overall by history and exam, recent data reviewed with pt, and pt to continue medical treatment as before,  to f/u any worsening symptoms or concerns

## 2020-01-07 NOTE — Assessment & Plan Note (Signed)
C/w inflammatory dermatitis - for predpac asd

## 2020-01-07 NOTE — Assessment & Plan Note (Signed)
Mild night time behaviors, for seroquel 50 qhs

## 2020-03-12 ENCOUNTER — Other Ambulatory Visit: Payer: Self-pay | Admitting: Cardiovascular Disease

## 2020-05-19 ENCOUNTER — Telehealth: Payer: Self-pay | Admitting: Cardiovascular Disease

## 2020-05-19 DIAGNOSIS — I10 Essential (primary) hypertension: Secondary | ICD-10-CM

## 2020-05-19 MED ORDER — LOSARTAN POTASSIUM 50 MG PO TABS: 100.0000 mg | ORAL_TABLET | Freq: Every day | ORAL | 1 refills | Status: DC

## 2020-05-19 NOTE — Telephone Encounter (Signed)
Spoke with pt and made him aware that Dr. Angelena Form is ok with him continue Losartan '100mg'$  QD but we need to keep the plan for the lab work.  Pt only has 3 days worth of medication left.  Advised I will send in a 30 day supply of medication and once labs are reviewed, we can send in a 90 day of the '100mg'$  tablets if he is able to stay on that dose.  Pt agreeable to plan and thanked me for the call.

## 2020-05-19 NOTE — Telephone Encounter (Signed)
OK with me to continue this dose of Losartan and check labs as suggested. Thanks, chris

## 2020-05-19 NOTE — Telephone Encounter (Signed)
Pt c/o medication issue:  1. Name of Medication: losartan (COZAAR) 50 MG tablet  2. How are you currently taking this medication (dosage and times per day)? 2 tablets daily   3. Are you having a reaction (difficulty breathing--STAT)? No   4. What is your medication issue? Christa is calling stating 1.5 tablets daily was not helping his BP so he began taking 2 daily and it helped. Due to the increase he is going to run out of medication this week. He is requesting a new prescription be sent for 2 tablets daily and it go to the pharmacy listed on file for a 90 day supply. Please advise.

## 2020-05-19 NOTE — Telephone Encounter (Signed)
Pt states he increased his Losartan from '75mg'$  QD to '100mg'$  QD on his own about 2 months ago because his BPs were still consistently high.  States since increasing BP runs 120s/80.  Denies any issues with higher dose.  Advised pt we would need to check his kidney function since he increased the dose.  Pt agreeable to come tomorrow for labs.  Advised I will send message to Dr. Angelena Form for review of Losartan dose.

## 2020-05-20 ENCOUNTER — Other Ambulatory Visit: Payer: Medicare HMO | Admitting: *Deleted

## 2020-05-20 ENCOUNTER — Other Ambulatory Visit: Payer: Self-pay

## 2020-05-20 DIAGNOSIS — I10 Essential (primary) hypertension: Secondary | ICD-10-CM

## 2020-05-20 NOTE — Addendum Note (Signed)
Addended by: Loren Racer on: 05/20/2020 11:31 AM   Modules accepted: Orders

## 2020-05-21 ENCOUNTER — Other Ambulatory Visit: Payer: Self-pay | Admitting: Physician Assistant

## 2020-05-21 ENCOUNTER — Telehealth: Payer: Self-pay | Admitting: Physician Assistant

## 2020-05-21 DIAGNOSIS — I10 Essential (primary) hypertension: Secondary | ICD-10-CM

## 2020-05-21 DIAGNOSIS — E875 Hyperkalemia: Secondary | ICD-10-CM

## 2020-05-21 LAB — BASIC METABOLIC PANEL
BUN/Creatinine Ratio: 25 — ABNORMAL HIGH (ref 10–24)
BUN: 33 mg/dL — ABNORMAL HIGH (ref 8–27)
CO2: 21 mmol/L (ref 20–29)
Calcium: 9.3 mg/dL (ref 8.6–10.2)
Chloride: 105 mmol/L (ref 96–106)
Creatinine, Ser: 1.31 mg/dL — ABNORMAL HIGH (ref 0.76–1.27)
Glucose: 116 mg/dL — ABNORMAL HIGH (ref 65–99)
Potassium: 5.9 mmol/L (ref 3.5–5.2)
Sodium: 138 mmol/L (ref 134–144)
eGFR: 54 mL/min/{1.73_m2} — ABNORMAL LOW (ref >59)

## 2020-05-21 NOTE — Telephone Encounter (Signed)
Mr. Colmenero is a 87 year old male who is being followed by Dr. Angelena Form for history of coronary artery disease s/p CABG.  Recently, in the past 69-month he self increased his losartan from 75 mg daily to 100 mg daily.  Based on [place] by our overnight fellow, [facility] called regarding critical lab result of potassium 5.9.  Renal function is stable.  Talking with the patient, he denies any symptom of severe fatigue or feeling of passing out.  Losartan is his only medication is capable of raising the potassium level.  I asked him to hold his losartan for 2 days before going back down to 75 mg daily.  He will need a repeat basic metabolic panel early next week.  I also instructed the patient to come to the emergency room if he does develop any sign of severe fatigue or feeling of passing out.  Order for basic metabolic panel has been placed in the system.  CAutoZonescheduler or triage nurse will need to contact the patient either on Monday or Tuesday to bring the patient in for lab work.

## 2020-05-23 NOTE — Addendum Note (Signed)
Addended by: Harland German A on: 05/23/2020 10:57 AM   Modules accepted: Orders

## 2020-05-23 NOTE — Telephone Encounter (Signed)
BMET scheduled tomorrow.

## 2020-05-23 NOTE — Telephone Encounter (Signed)
Need blood work either today or Tuesday at [facility].

## 2020-05-23 NOTE — Telephone Encounter (Signed)
Called patient, Philip Kerr and reminded him that Almyra Deforest, PA-C would like for him to come into the [facility] office to have repeat blood work today or tomorrow (Tuesday 05/24/20). Patient stated that he will come in Tuesday. Philip Kerr verbalized understanding and all (if any) questions were answered.

## 2020-05-24 ENCOUNTER — Other Ambulatory Visit: Payer: Self-pay

## 2020-05-24 ENCOUNTER — Telehealth: Payer: Self-pay | Admitting: *Deleted

## 2020-05-24 ENCOUNTER — Other Ambulatory Visit: Payer: Medicare HMO

## 2020-05-24 DIAGNOSIS — I10 Essential (primary) hypertension: Secondary | ICD-10-CM

## 2020-05-24 DIAGNOSIS — E875 Hyperkalemia: Secondary | ICD-10-CM

## 2020-05-24 LAB — BASIC METABOLIC PANEL
BUN/Creatinine Ratio: 27 — ABNORMAL HIGH (ref 10–24)
BUN: 35 mg/dL — ABNORMAL HIGH (ref 8–27)
CO2: 20 mmol/L (ref 20–29)
Calcium: 9.4 mg/dL (ref 8.6–10.2)
Chloride: 108 mmol/L — ABNORMAL HIGH (ref 96–106)
Creatinine, Ser: 1.31 mg/dL — ABNORMAL HIGH (ref 0.76–1.27)
Glucose: 131 mg/dL — ABNORMAL HIGH (ref 65–99)
Potassium: 5.5 mmol/L — ABNORMAL HIGH (ref 3.5–5.2)
Sodium: 140 mmol/L (ref 134–144)
eGFR: 54 mL/min/{1.73_m2} — ABNORMAL LOW (ref >59)

## 2020-05-24 MED ORDER — LOSARTAN POTASSIUM 50 MG PO TABS: 50.0000 mg | ORAL_TABLET | Freq: Every day | ORAL | 3 refills | Status: DC

## 2020-05-24 NOTE — Telephone Encounter (Signed)
Spoke with patient.  Informed of results.  He reports that as of today he started taking losartan 50 mg and he will continue this dose and will return next Thursday for repeat BMET.  Med list updated and lab appointment scheduled.

## 2020-05-24 NOTE — Telephone Encounter (Signed)
-----   Message from Burnell Blanks, MD sent at 05/24/2020  5:02 PM EDT ----- His potassium level is still just above normal. Would reduce his Losartan dose to 50 mg per day. No other medications should be contributing to hyperkalemia. Repeat BMET 7-10 days. Thanks, chris

## 2020-06-02 ENCOUNTER — Other Ambulatory Visit: Payer: Medicare HMO

## 2020-06-02 ENCOUNTER — Other Ambulatory Visit: Payer: Self-pay

## 2020-06-02 DIAGNOSIS — E875 Hyperkalemia: Secondary | ICD-10-CM

## 2020-06-02 LAB — BASIC METABOLIC PANEL
BUN/Creatinine Ratio: 31 — ABNORMAL HIGH (ref 10–24)
BUN: 43 mg/dL — ABNORMAL HIGH (ref 8–27)
CO2: 20 mmol/L (ref 20–29)
Calcium: 9.3 mg/dL (ref 8.6–10.2)
Chloride: 105 mmol/L (ref 96–106)
Creatinine, Ser: 1.39 mg/dL — ABNORMAL HIGH (ref 0.76–1.27)
Glucose: 107 mg/dL — ABNORMAL HIGH (ref 65–99)
Potassium: 5 mmol/L (ref 3.5–5.2)
Sodium: 137 mmol/L (ref 134–144)
eGFR: 50 mL/min/{1.73_m2} — ABNORMAL LOW (ref >59)

## 2020-07-22 ENCOUNTER — Telehealth: Payer: Self-pay | Admitting: Cardiovascular Disease

## 2020-07-22 ENCOUNTER — Encounter (HOSPITAL_COMMUNITY): Payer: Self-pay | Admitting: Emergency Medicine

## 2020-07-22 ENCOUNTER — Encounter (HOSPITAL_COMMUNITY): Payer: Self-pay | Admitting: *Deleted

## 2020-07-22 ENCOUNTER — Inpatient Hospital Stay (HOSPITAL_COMMUNITY)
Admission: EM | Admit: 2020-07-22 | Discharge: 2020-08-08 | DRG: 981 | Disposition: A | Discharge status: Home Health | Payer: Medicare HMO | Attending: Internal Medicine | Admitting: Internal Medicine

## 2020-07-22 ENCOUNTER — Emergency Department (HOSPITAL_COMMUNITY): Payer: Medicare HMO

## 2020-07-22 ENCOUNTER — Other Ambulatory Visit: Payer: Self-pay

## 2020-07-22 ENCOUNTER — Observation Stay (HOSPITAL_COMMUNITY)
Admission: EM | Admit: 2020-07-22 | Discharge: 2020-07-22 | Disposition: A | Discharge status: Home / Self Care | Payer: Medicare HMO | Source: Home / Self Care | Attending: Emergency Medicine | Admitting: Emergency Medicine

## 2020-07-22 DIAGNOSIS — I4430 Unspecified atrioventricular block: Secondary | ICD-10-CM | POA: Diagnosis present

## 2020-07-22 DIAGNOSIS — U071 COVID-19: Secondary | ICD-10-CM | POA: Diagnosis present

## 2020-07-22 DIAGNOSIS — D62 Acute posthemorrhagic anemia: Secondary | ICD-10-CM | POA: Diagnosis not present

## 2020-07-22 DIAGNOSIS — I509 Heart failure, unspecified: Secondary | ICD-10-CM

## 2020-07-22 DIAGNOSIS — N179 Acute kidney failure, unspecified: Secondary | ICD-10-CM | POA: Diagnosis not present

## 2020-07-22 DIAGNOSIS — R0789 Other chest pain: Secondary | ICD-10-CM | POA: Insufficient documentation

## 2020-07-22 DIAGNOSIS — E119 Type 2 diabetes mellitus without complications: Secondary | ICD-10-CM

## 2020-07-22 DIAGNOSIS — K4090 Unilateral inguinal hernia, without obstruction or gangrene, not specified as recurrent: Secondary | ICD-10-CM | POA: Diagnosis not present

## 2020-07-22 DIAGNOSIS — J189 Pneumonia, unspecified organism: Secondary | ICD-10-CM | POA: Diagnosis present

## 2020-07-22 DIAGNOSIS — J159 Unspecified bacterial pneumonia: Principal | ICD-10-CM | POA: Diagnosis present

## 2020-07-22 DIAGNOSIS — G8929 Other chronic pain: Secondary | ICD-10-CM | POA: Diagnosis present

## 2020-07-22 DIAGNOSIS — I2510 Atherosclerotic heart disease of native coronary artery without angina pectoris: Secondary | ICD-10-CM | POA: Insufficient documentation

## 2020-07-22 DIAGNOSIS — I493 Ventricular premature depolarization: Secondary | ICD-10-CM | POA: Diagnosis not present

## 2020-07-22 DIAGNOSIS — R310 Gross hematuria: Secondary | ICD-10-CM | POA: Diagnosis not present

## 2020-07-22 DIAGNOSIS — Z951 Presence of aortocoronary bypass graft: Secondary | ICD-10-CM | POA: Insufficient documentation

## 2020-07-22 DIAGNOSIS — M549 Dorsalgia, unspecified: Secondary | ICD-10-CM | POA: Diagnosis present

## 2020-07-22 DIAGNOSIS — N210 Calculus in bladder: Secondary | ICD-10-CM | POA: Diagnosis present

## 2020-07-22 DIAGNOSIS — N170 Acute kidney failure with tubular necrosis: Secondary | ICD-10-CM | POA: Diagnosis not present

## 2020-07-22 DIAGNOSIS — J1282 Pneumonia due to coronavirus disease 2019: Secondary | ICD-10-CM | POA: Diagnosis present

## 2020-07-22 DIAGNOSIS — S3739XA Other injury of urethra, initial encounter: Secondary | ICD-10-CM | POA: Diagnosis not present

## 2020-07-22 DIAGNOSIS — F0390 Unspecified dementia without behavioral disturbance: Secondary | ICD-10-CM | POA: Insufficient documentation

## 2020-07-22 DIAGNOSIS — D649 Anemia, unspecified: Secondary | ICD-10-CM | POA: Diagnosis not present

## 2020-07-22 DIAGNOSIS — I5022 Chronic systolic (congestive) heart failure: Secondary | ICD-10-CM | POA: Diagnosis present

## 2020-07-22 DIAGNOSIS — N183 Chronic kidney disease, stage 3 unspecified: Secondary | ICD-10-CM | POA: Insufficient documentation

## 2020-07-22 DIAGNOSIS — Z7982 Long term (current) use of aspirin: Secondary | ICD-10-CM | POA: Insufficient documentation

## 2020-07-22 DIAGNOSIS — I255 Ischemic cardiomyopathy: Secondary | ICD-10-CM | POA: Diagnosis present

## 2020-07-22 DIAGNOSIS — N400 Enlarged prostate without lower urinary tract symptoms: Secondary | ICD-10-CM | POA: Diagnosis not present

## 2020-07-22 DIAGNOSIS — I130 Hypertensive heart and chronic kidney disease with heart failure and stage 1 through stage 4 chronic kidney disease, or unspecified chronic kidney disease: Secondary | ICD-10-CM | POA: Diagnosis present

## 2020-07-22 DIAGNOSIS — G9341 Metabolic encephalopathy: Secondary | ICD-10-CM | POA: Diagnosis not present

## 2020-07-22 DIAGNOSIS — R0602 Shortness of breath: Secondary | ICD-10-CM | POA: Insufficient documentation

## 2020-07-22 DIAGNOSIS — I4729 Other ventricular tachycardia: Secondary | ICD-10-CM

## 2020-07-22 DIAGNOSIS — E1122 Type 2 diabetes mellitus with diabetic chronic kidney disease: Secondary | ICD-10-CM | POA: Diagnosis present

## 2020-07-22 DIAGNOSIS — R569 Unspecified convulsions: Secondary | ICD-10-CM | POA: Diagnosis present

## 2020-07-22 DIAGNOSIS — R319 Hematuria, unspecified: Secondary | ICD-10-CM | POA: Diagnosis not present

## 2020-07-22 DIAGNOSIS — R4182 Altered mental status, unspecified: Secondary | ICD-10-CM | POA: Diagnosis not present

## 2020-07-22 DIAGNOSIS — S3730XA Unspecified injury of urethra, initial encounter: Secondary | ICD-10-CM | POA: Diagnosis not present

## 2020-07-22 DIAGNOSIS — E872 Acidosis: Secondary | ICD-10-CM | POA: Diagnosis present

## 2020-07-22 DIAGNOSIS — R9431 Abnormal electrocardiogram [ECG] [EKG]: Secondary | ICD-10-CM

## 2020-07-22 DIAGNOSIS — F05 Delirium due to known physiological condition: Secondary | ICD-10-CM | POA: Diagnosis not present

## 2020-07-22 DIAGNOSIS — I5043 Acute on chronic combined systolic (congestive) and diastolic (congestive) heart failure: Secondary | ICD-10-CM | POA: Diagnosis not present

## 2020-07-22 DIAGNOSIS — E871 Hypo-osmolality and hyponatremia: Secondary | ICD-10-CM | POA: Diagnosis present

## 2020-07-22 DIAGNOSIS — R7401 Elevation of levels of liver transaminase levels: Secondary | ICD-10-CM | POA: Diagnosis not present

## 2020-07-22 DIAGNOSIS — F0391 Unspecified dementia with behavioral disturbance: Secondary | ICD-10-CM | POA: Diagnosis present

## 2020-07-22 DIAGNOSIS — I10 Essential (primary) hypertension: Secondary | ICD-10-CM | POA: Diagnosis present

## 2020-07-22 DIAGNOSIS — Y738 Miscellaneous gastroenterology and urology devices associated with adverse incidents, not elsewhere classified: Secondary | ICD-10-CM | POA: Diagnosis not present

## 2020-07-22 DIAGNOSIS — Z79899 Other long term (current) drug therapy: Secondary | ICD-10-CM | POA: Insufficient documentation

## 2020-07-22 DIAGNOSIS — Z7189 Other specified counseling: Secondary | ICD-10-CM | POA: Diagnosis not present

## 2020-07-22 DIAGNOSIS — Z515 Encounter for palliative care: Secondary | ICD-10-CM | POA: Diagnosis not present

## 2020-07-22 DIAGNOSIS — R34 Anuria and oliguria: Secondary | ICD-10-CM | POA: Diagnosis not present

## 2020-07-22 DIAGNOSIS — R531 Weakness: Secondary | ICD-10-CM | POA: Diagnosis not present

## 2020-07-22 DIAGNOSIS — N1831 Chronic kidney disease, stage 3a: Secondary | ICD-10-CM | POA: Diagnosis present

## 2020-07-22 DIAGNOSIS — R410 Disorientation, unspecified: Secondary | ICD-10-CM | POA: Diagnosis not present

## 2020-07-22 DIAGNOSIS — Z66 Do not resuscitate: Secondary | ICD-10-CM | POA: Diagnosis present

## 2020-07-22 DIAGNOSIS — F32A Depression, unspecified: Secondary | ICD-10-CM | POA: Diagnosis present

## 2020-07-22 DIAGNOSIS — Z7984 Long term (current) use of oral hypoglycemic drugs: Secondary | ICD-10-CM

## 2020-07-22 DIAGNOSIS — E876 Hypokalemia: Secondary | ICD-10-CM | POA: Diagnosis present

## 2020-07-22 DIAGNOSIS — R079 Chest pain, unspecified: Secondary | ICD-10-CM | POA: Diagnosis not present

## 2020-07-22 DIAGNOSIS — N3289 Other specified disorders of bladder: Secondary | ICD-10-CM | POA: Diagnosis not present

## 2020-07-22 DIAGNOSIS — I5021 Acute systolic (congestive) heart failure: Secondary | ICD-10-CM | POA: Diagnosis not present

## 2020-07-22 DIAGNOSIS — N401 Enlarged prostate with lower urinary tract symptoms: Secondary | ICD-10-CM | POA: Diagnosis present

## 2020-07-22 DIAGNOSIS — I472 Ventricular tachycardia: Secondary | ICD-10-CM | POA: Diagnosis present

## 2020-07-22 DIAGNOSIS — N281 Cyst of kidney, acquired: Secondary | ICD-10-CM | POA: Diagnosis not present

## 2020-07-22 DIAGNOSIS — D7589 Other specified diseases of blood and blood-forming organs: Secondary | ICD-10-CM | POA: Diagnosis present

## 2020-07-22 DIAGNOSIS — J181 Lobar pneumonia, unspecified organism: Secondary | ICD-10-CM | POA: Diagnosis not present

## 2020-07-22 DIAGNOSIS — I959 Hypotension, unspecified: Secondary | ICD-10-CM | POA: Diagnosis present

## 2020-07-22 DIAGNOSIS — I129 Hypertensive chronic kidney disease with stage 1 through stage 4 chronic kidney disease, or unspecified chronic kidney disease: Secondary | ICD-10-CM | POA: Insufficient documentation

## 2020-07-22 DIAGNOSIS — J90 Pleural effusion, not elsewhere classified: Secondary | ICD-10-CM | POA: Diagnosis not present

## 2020-07-22 DIAGNOSIS — I447 Left bundle-branch block, unspecified: Secondary | ICD-10-CM | POA: Diagnosis not present

## 2020-07-22 DIAGNOSIS — R338 Other retention of urine: Secondary | ICD-10-CM | POA: Diagnosis present

## 2020-07-22 DIAGNOSIS — R109 Unspecified abdominal pain: Secondary | ICD-10-CM | POA: Diagnosis not present

## 2020-07-22 DIAGNOSIS — J9811 Atelectasis: Secondary | ICD-10-CM | POA: Diagnosis not present

## 2020-07-22 DIAGNOSIS — K5730 Diverticulosis of large intestine without perforation or abscess without bleeding: Secondary | ICD-10-CM | POA: Diagnosis not present

## 2020-07-22 DIAGNOSIS — R0781 Pleurodynia: Secondary | ICD-10-CM

## 2020-07-22 DIAGNOSIS — E869 Volume depletion, unspecified: Secondary | ICD-10-CM | POA: Diagnosis not present

## 2020-07-22 DIAGNOSIS — R0600 Dyspnea, unspecified: Secondary | ICD-10-CM

## 2020-07-22 DIAGNOSIS — F03918 Unspecified dementia, unspecified severity, with other behavioral disturbance: Secondary | ICD-10-CM | POA: Diagnosis present

## 2020-07-22 DIAGNOSIS — N289 Disorder of kidney and ureter, unspecified: Secondary | ICD-10-CM | POA: Diagnosis not present

## 2020-07-22 DIAGNOSIS — E7800 Pure hypercholesterolemia, unspecified: Secondary | ICD-10-CM | POA: Diagnosis present

## 2020-07-22 DIAGNOSIS — N184 Chronic kidney disease, stage 4 (severe): Secondary | ICD-10-CM | POA: Diagnosis present

## 2020-07-22 DIAGNOSIS — K838 Other specified diseases of biliary tract: Secondary | ICD-10-CM | POA: Diagnosis not present

## 2020-07-22 DIAGNOSIS — T83021A Displacement of indwelling urethral catheter, initial encounter: Secondary | ICD-10-CM | POA: Diagnosis not present

## 2020-07-22 LAB — CBC
HCT: 37.1 % — ABNORMAL LOW (ref 39.0–52.0)
HCT: 37.5 % — ABNORMAL LOW (ref 39.0–52.0)
Hemoglobin: 12.4 g/dL — ABNORMAL LOW (ref 13.0–17.0)
Hemoglobin: 12.7 g/dL — ABNORMAL LOW (ref 13.0–17.0)
MCH: 35.3 pg — ABNORMAL HIGH (ref 26.0–34.0)
MCH: 35.1 pg — ABNORMAL HIGH (ref 26.0–34.0)
MCHC: 33.4 g/dL (ref 30.0–36.0)
MCHC: 33.9 g/dL (ref 30.0–36.0)
MCV: 105.7 fL — ABNORMAL HIGH (ref 80.0–100.0)
MCV: 103.6 fL — ABNORMAL HIGH (ref 80.0–100.0)
Platelets: 297 10*3/uL (ref 150–400)
Platelets: 250 10*3/uL (ref 150–400)
RBC: 3.51 MIL/uL — ABNORMAL LOW (ref 4.22–5.81)
RBC: 3.62 MIL/uL — ABNORMAL LOW (ref 4.22–5.81)
RDW: 12.7 % (ref 11.5–15.5)
RDW: 12.6 % (ref 11.5–15.5)
WBC: 10.1 10*3/uL (ref 4.0–10.5)
WBC: 16.2 10*3/uL — ABNORMAL HIGH (ref 4.0–10.5)
nRBC: 0 % (ref 0.0–0.2)
nRBC: 0 % (ref 0.0–0.2)

## 2020-07-22 LAB — BASIC METABOLIC PANEL
Anion gap: 8 (ref 5–15)
Anion gap: 7 (ref 5–15)
BUN: 23 mg/dL (ref 8–23)
BUN: 23 mg/dL (ref 8–23)
CO2: 22 mmol/L (ref 22–32)
CO2: 21 mmol/L — ABNORMAL LOW (ref 22–32)
Calcium: 8.6 mg/dL — ABNORMAL LOW (ref 8.9–10.3)
Calcium: 8.7 mg/dL — ABNORMAL LOW (ref 8.9–10.3)
Chloride: 106 mmol/L (ref 98–111)
Chloride: 107 mmol/L (ref 98–111)
Creatinine, Ser: 1.44 mg/dL — ABNORMAL HIGH (ref 0.61–1.24)
Creatinine, Ser: 1.26 mg/dL — ABNORMAL HIGH (ref 0.61–1.24)
GFR, Estimated: 48 mL/min — ABNORMAL LOW (ref >60)
GFR, Estimated: 56 mL/min — ABNORMAL LOW (ref >60)
Glucose, Bld: 125 mg/dL — ABNORMAL HIGH (ref 70–99)
Glucose, Bld: 214 mg/dL — ABNORMAL HIGH (ref 70–99)
Potassium: 4.1 mmol/L (ref 3.5–5.1)
Potassium: 4.5 mmol/L (ref 3.5–5.1)
Sodium: 136 mmol/L (ref 135–145)
Sodium: 135 mmol/L (ref 135–145)

## 2020-07-22 LAB — TROPONIN I (HIGH SENSITIVITY)
Troponin I (High Sensitivity): 28 ng/L — ABNORMAL HIGH (ref <18)
Troponin I (High Sensitivity): 24 ng/L — ABNORMAL HIGH (ref <18)
Troponin I (High Sensitivity): 27 ng/L — ABNORMAL HIGH (ref <18)
Troponin I (High Sensitivity): 32 ng/L — ABNORMAL HIGH (ref <18)

## 2020-07-22 LAB — RESP PANEL BY RT-PCR (FLU A&B, COVID) ARPGX2
Influenza A by PCR: NEGATIVE
Influenza B by PCR: NEGATIVE
SARS Coronavirus 2 by RT PCR: POSITIVE — AB

## 2020-07-22 LAB — BRAIN NATRIURETIC PEPTIDE: B Natriuretic Peptide: 734 pg/mL — ABNORMAL HIGH (ref 0.0–100.0)

## 2020-07-22 LAB — CBG MONITORING, ED: Glucose-Capillary: 200 mg/dL — ABNORMAL HIGH (ref 70–99)

## 2020-07-22 LAB — PROCALCITONIN: Procalcitonin: 0.51 ng/mL

## 2020-07-22 LAB — D-DIMER, QUANTITATIVE: D-Dimer, Quant: 1.56 ug/mL-FEU — ABNORMAL HIGH (ref 0.00–0.50)

## 2020-07-22 LAB — C-REACTIVE PROTEIN: CRP: 8.8 mg/dL — ABNORMAL HIGH

## 2020-07-22 LAB — LACTATE DEHYDROGENASE: LDH: 1501 U/L — ABNORMAL HIGH (ref 98–192)

## 2020-07-22 LAB — FERRITIN: Ferritin: 2204 ng/mL — ABNORMAL HIGH (ref 24–336)

## 2020-07-22 MED ORDER — ASPIRIN 81 MG PO CHEW
324.0000 mg | CHEWABLE_TABLET | Freq: Once | ORAL | Status: AC
  Administered 2020-07-22: 324 mg via ORAL
  Filled 2020-07-22: qty 4

## 2020-07-22 MED ORDER — ACETAMINOPHEN 650 MG RE SUPP: 650.0000 mg | Freq: Four times a day (QID) | RECTAL | Status: DC | PRN

## 2020-07-22 MED ORDER — ONDANSETRON HCL 4 MG/2ML IJ SOLN: 4.0000 mg | Freq: Four times a day (QID) | INTRAMUSCULAR | Status: DC | PRN

## 2020-07-22 MED ORDER — SENNOSIDES-DOCUSATE SODIUM 8.6-50 MG PO TABS: 1.0000 | ORAL_TABLET | Freq: Every evening | ORAL | Status: DC | PRN

## 2020-07-22 MED ORDER — ROSUVASTATIN CALCIUM 20 MG PO TABS: 20.0000 mg | ORAL_TABLET | Freq: Every day | ORAL | Status: DC

## 2020-07-22 MED ORDER — ACETAMINOPHEN 325 MG PO TABS: 650.0000 mg | ORAL_TABLET | Freq: Four times a day (QID) | ORAL | Status: DC | PRN

## 2020-07-22 MED ORDER — INSULIN ASPART 100 UNIT/ML IJ SOLN: 0.0000 [IU] | Freq: Every day | INTRAMUSCULAR | Status: DC

## 2020-07-22 MED ORDER — GUAIFENESIN-DM 100-10 MG/5ML PO SYRP: 10.0000 mL | ORAL_SOLUTION | ORAL | Status: DC | PRN

## 2020-07-22 MED ORDER — SODIUM CHLORIDE 0.9 % IV SOLN
2.0000 g | INTRAVENOUS | Status: DC
  Administered 2020-07-23: 2 g via INTRAVENOUS
  Filled 2020-07-22: qty 20

## 2020-07-22 MED ORDER — METOPROLOL TARTRATE 25 MG PO TABS
25.0000 mg | ORAL_TABLET | Freq: Two times a day (BID) | ORAL | Status: DC
  Administered 2020-07-23: 25 mg via ORAL
  Filled 2020-07-22: qty 1

## 2020-07-22 MED ORDER — INSULIN ASPART 100 UNIT/ML IJ SOLN
0.0000 [IU] | Freq: Three times a day (TID) | INTRAMUSCULAR | Status: DC
Start: 2020-07-23 — End: 2020-08-08
  Administered 2020-07-23 (×2): 1 [IU] via SUBCUTANEOUS
  Administered 2020-07-23: 3 [IU] via SUBCUTANEOUS
  Administered 2020-07-26: 1 [IU] via SUBCUTANEOUS
  Administered 2020-07-26 – 2020-07-27 (×3): 2 [IU] via SUBCUTANEOUS
  Administered 2020-07-27: 1 [IU] via SUBCUTANEOUS
  Administered 2020-07-28: 2 [IU] via SUBCUTANEOUS
  Administered 2020-07-28 – 2020-07-30 (×4): 1 [IU] via SUBCUTANEOUS
  Administered 2020-07-31: 2 [IU] via SUBCUTANEOUS
  Administered 2020-07-31: 1 [IU] via SUBCUTANEOUS
  Administered 2020-08-01: 2 [IU] via SUBCUTANEOUS
  Administered 2020-08-01 – 2020-08-07 (×10): 1 [IU] via SUBCUTANEOUS
  Administered 2020-08-08: 2 [IU] via SUBCUTANEOUS
  Administered 2020-08-08: 1 [IU] via SUBCUTANEOUS

## 2020-07-22 MED ORDER — SODIUM CHLORIDE 0.9 % IV SOLN
1.0000 g | Freq: Once | INTRAVENOUS | Status: AC
  Administered 2020-07-22: 1 g via INTRAVENOUS
  Filled 2020-07-22: qty 10

## 2020-07-22 MED ORDER — NITROGLYCERIN 0.4 MG SL SUBL: 0.4000 mg | SUBLINGUAL_TABLET | Freq: Once | SUBLINGUAL | Status: DC

## 2020-07-22 MED ORDER — ONDANSETRON HCL 4 MG PO TABS: 4.0000 mg | ORAL_TABLET | Freq: Four times a day (QID) | ORAL | Status: DC | PRN

## 2020-07-22 MED ORDER — ASPIRIN EC 81 MG PO TBEC
81.0000 mg | DELAYED_RELEASE_TABLET | Freq: Every morning | ORAL | Status: DC
  Administered 2020-07-26 – 2020-07-31 (×6): 81 mg via ORAL
  Filled 2020-07-22 (×8): qty 1

## 2020-07-22 MED ORDER — SODIUM CHLORIDE 0.9 % IV SOLN
100.0000 mg | Freq: Two times a day (BID) | INTRAVENOUS | Status: DC
  Administered 2020-07-23: 100 mg via INTRAVENOUS
  Filled 2020-07-22 (×4): qty 100

## 2020-07-22 MED ORDER — ENOXAPARIN SODIUM 40 MG/0.4ML IJ SOSY
40.0000 mg | PREFILLED_SYRINGE | INTRAMUSCULAR | Status: DC
  Administered 2020-07-22: 40 mg via SUBCUTANEOUS
  Filled 2020-07-22: qty 0.4

## 2020-07-22 MED ORDER — NITROGLYCERIN 0.4 MG SL SUBL
0.4000 mg | SUBLINGUAL_TABLET | SUBLINGUAL | Status: DC | PRN
  Administered 2020-07-22: 0.4 mg via SUBLINGUAL
  Filled 2020-07-22: qty 1

## 2020-07-22 MED ORDER — SODIUM CHLORIDE 0.9 % IV SOLN
100.0000 mg | Freq: Once | INTRAVENOUS | Status: AC
  Administered 2020-07-22: 100 mg via INTRAVENOUS
  Filled 2020-07-22: qty 100

## 2020-07-22 MED ORDER — LOSARTAN POTASSIUM 50 MG PO TABS
25.0000 mg | ORAL_TABLET | Freq: Two times a day (BID) | ORAL | Status: DC
  Administered 2020-07-23: 25 mg via ORAL
  Filled 2020-07-22: qty 1

## 2020-07-22 NOTE — Telephone Encounter (Signed)
Pt c/o of Chest Pain: STAT if CP now or developed within 24 hours  1. Are you having CP right now? Yes 5am today  2. Are you experiencing any other symptoms (ex. SOB, nausea, vomiting, sweating)? Gagging but not vomiting  3. How long have you been experiencing CP? 5am this morning  4. Is your CP continuous or coming and going? continuous  5. Have you taken Nitroglycerin? Pt was given Nitro at [facility] this morning before he was discharged ?

## 2020-07-22 NOTE — ED Triage Notes (Signed)
Patient presents to ed c/o chest pain onset yest , denies sob

## 2020-07-22 NOTE — Consult Note (Signed)
ER Consult   Philip Kerr [id number] DOB: May 12, 1933 DOA: 07/22/2020  PCP: Biagio Borg, MD Consultants:  Angelena Form - cardiology Patient coming from:  Home - lives with wife; NOK: Wife, Alixzander Mcsween, [phone]  Chief Complaint: Chest pain  HPI: [name] is a 87 y.o. male with medical history significant of CAD s/p CABG; DM; and HLD presenting with chest pain.  He reports that he and his wife have been sick over the last few weeks with n/v and severe fatigue, but have been feeling better.  He has chronic intermittent SOB and cough and those are relatively unchanged.  Last night he was in bed watching TV when he developed acute onset of diaphragmatic CP along the entire bottom of his chest; it finally basically resolved in the ER with ASA.  He was concerned that he was having a heart attack, although this was different from his prior presentation which led to CABG.  He is currently feeling better and desires d/c.    ED Course: Crushing CP, has RF.  Troponins flat, COVID positive.  Cardiology will consult.  ?demand from COVID contributing.  Has chronic SOB, cough - no change from baseline.  Review of Systems: As per HPI; otherwise review of systems reviewed and negative.   Ambulatory Status:  Ambulates without assistance  COVID Vaccine Status:  Complete  Past Medical History:  Diagnosis Date   Aortic insufficiency    Coronary artery disease    coronary artery bypass graft x 5 in 1996   Diabetes mellitus    Hypercholesterolemia    non-insulin dependent   Hypertension    Ischemic cardiomyopathy    Mitral regurgitation    PVC (premature ventricular contraction)     Past Surgical History:  Procedure Laterality Date   CORONARY ARTERY BYPASS GRAFT     LUNG SURGERY     OTHER SURGICAL HISTORY     percutaneous coronary intervention of the  posterolateral segment on 01/27/1998    Social History   Socioeconomic History   Marital status: Married    Spouse name:  Not on file   Number of children: 1   Years of education: Not on file   Highest education level: Not on file  Occupational History   Occupation: Runs a Acupuncturist: SELF-EMPLOYED  Tobacco Use   Smoking status: Never   Smokeless tobacco: Never  Vaping Use   Vaping Use: Never used  Substance and Sexual Activity   Alcohol use: No   Drug use: No   Sexual activity: Not Currently  Other Topics Concern   Not on file  Social History Narrative   Not on file   Social Determinants of Health   Financial Resource Strain: Not on file  Food Insecurity: Not on file  Transportation Needs: Not on file  Physical Activity: Not on file  Stress: Not on file  Social Connections: Not on file  Intimate Partner Violence: Not on file    No Known Allergies  Family History  Problem Relation Age of Onset   CAD Neg Hx     Prior to Admission medications   Medication Sig Start Date End Date Taking? Authorizing Provider  albuterol (PROVENTIL HFA;VENTOLIN HFA) 108 (90 Base) MCG/ACT inhaler Inhale 2 puffs into the lungs every 6 (six) hours as needed for wheezing or shortness of breath. 02/18/18   Biagio Borg, MD  amLODipine (NORVASC) 5 MG tablet Take 1 tablet (5 mg total) by mouth daily. 12/17/19  Burnell Blanks, MD  aspirin 81 MG tablet Take 81 mg by mouth daily.    [provider]  citalopram (CELEXA) 10 MG tablet Take 1 tablet (10 mg total) by mouth daily. 09/18/17 09/18/18  Biagio Borg, MD  doxazosin (CARDURA) 2 MG tablet TAKE 1 TABLET (2 MG TOTAL) BY MOUTH AT BEDTIME. 11/17/19   Biagio Borg, MD  furosemide (LASIX) 20 MG tablet TAKE 1 TABLET (20 MG TOTAL) BY MOUTH DAILY AS NEEDED (FOR SWELLING). 03/14/20   Burnell Blanks, MD  losartan (COZAAR) 50 MG tablet Take 1 tablet (50 mg total) by mouth daily. 05/24/20   Burnell Blanks, MD  metoprolol tartrate (LOPRESSOR) 50 MG tablet TAKE 0.5 TABLET (25 MG TOTAL) BY MOUTH 2 TIMES DAILY 12/25/19   Bhagat,  [place], PA  pramipexole (MIRAPEX) 0.25 MG tablet Take 1 tablet (0.25 mg total) by mouth at bedtime. 08/27/17   Biagio Borg, MD  predniSONE (DELTASONE) 10 MG tablet 1 tabs by mouth per day for 5 days 12/22/19   Biagio Borg, MD  QUEtiapine (SEROQUEL) 50 MG tablet Take 1 tablet (50 mg total) by mouth at bedtime. 12/22/19   Biagio Borg, MD  rosuvastatin (CRESTOR) 20 MG tablet Take 1 tablet (20 mg total) by mouth daily. 12/25/19   Leanor Kail, PA  tamsulosin (FLOMAX) 0.4 MG CAPS capsule TAKE 1 CAPSULE BY MOUTH EVERY DAY 01/29/18   Biagio Borg, MD  traZODone (DESYREL) 50 MG tablet TAKE 1/2 TO 1 TABLET BY MOUTH AT BEDTIME AS NEEDED FOR SLEEP 08/25/18   Biagio Borg, MD  vitamin C (ASCORBIC ACID) 500 MG tablet Take 500 mg by mouth daily.    [provider]    Physical Exam: Vitals:   07/22/20 1230 07/22/20 1300 07/22/20 1330 07/22/20 1401  BP: (!) 142/67 (!) 134/59 (!) 148/61   Pulse: 80 79 77   Resp: 19 (!) 22 20   Temp:    98 F (36.7 C)  TempSrc:    Oral  SpO2: 100% 99% 98%   Weight:      Height:         General:  Appears calm and comfortable and is in NAD Eyes:  PERRL, EOMI, normal lids, iris ENT:  grossly normal hearing, lips & tongue, mmm; poor dentition Neck:  no LAD, masses or thyromegaly Cardiovascular:  RRR, no m/r/g. No LE edema.  Respiratory:   CTA bilaterally with no wheezes/rales/rhonchi.  Diminished breath sounds in LLL region, where he had prior lobectomy. Normal respiratory effort. Abdomen:  soft, NT, ND Skin:  no rash or induration seen on limited exam Musculoskeletal:  grossly normal tone BUE/BLE, good ROM, no bony abnormality Psychiatric:  grossly normal mood and affect, speech fluent and appropriate, AOx3 Neurologic:  CN 2-12 grossly intact, moves all extremities in coordinated fashion    Radiological Exams on Admission: Independently reviewed - see discussion in A/P where applicable  DG Chest 2 View  Result Date:  07/22/2020 CLINICAL DATA:  87 year old male with chest pain. EXAM: CHEST - 2 VIEW COMPARISON:  Chest radiographs 09/06/2017. FINDINGS: AP and lateral views today. Chronic surgical clips and volume loss in the left hemithorax. Chronic pleural blunting or scarring at the costophrenic angle. Superimposed prior CABG. Mediastinal contours remain within normal limits. No pneumothorax, pulmonary edema, acute pleural effusion or pulmonary opacity. Overall lower lung volumes compared to 2019. Visualized tracheal air column is within normal limits. No acute osseous abnormality identified. Abdomen Calcified aortic atherosclerosis. Negative  visible bowel gas pattern. IMPRESSION: 1. Lower lung volumes but otherwise stable chest since 2019 with evidence of prior partial left pneumonectomy. 2.  Aortic Atherosclerosis (ICD10-I70.0). Electronically Signed   By: Genevie Ann M.D.   On: 07/22/2020 06:43    EKG: Independently reviewed.  Sinus bradycardia with rate 53; LVH; RBBB   Labs on Admission: I have personally reviewed the available labs and imaging studies at the time of the admission.  Pertinent labs:   Glucose 125 BUN 23/Creatinine 1.44/GFR 48 HS troponin 28, 24 Unremarkable CBC COVID    Assessment/Plan Principal Problem:   Chest pain of uncertain etiology Active Problems:   HYPERCHOLESTEROLEMIA   Essential hypertension   COVID-19 virus infection   Chest pain -Patient with substernal chest pressure that came on at rest overnight and resolved with ASA -1/3 typical symptoms suggestive of noncardiac chest pain.  -CXR unremarkable.   -Initial cardiac HS troponin minimally elevated with negative delta.  -EKG not indicative of acute ischemia.   -Cardiology consulted and felt comfortable with d/c from the ER -Continue ASA  COVID -Patient with 3 immunizations and positive test today -Initial concern was that this was acute infection leading to demand ischemia -However, in further discussion with patient,  it appears that both he and his wife had symptoms several weeks ago and both are improving; suspect that this is a positive test from prior recent infection -No O2 requirement -Treatment does not appear to be indicated at this time  HTN -Continue Norvasc, Cozaar, Lopressor  HLD -Continue Crestor  Mood d/o -Continue Celexa, Seroquel, Trazodone   Thank you for this interesting consult.  Patient is appropriate for d/c to home at this time and is aware of reasons to need to return for emergent evaluation as well as for outpatient cardiology and PCP f/u.      Karmen Bongo MD [facility]   How to contact the [facility] Attending or Consulting provider [place] or covering provider during after hours [place], for this patient?  Check the care team in [facility] and look for a) attending/consulting [facility] provider listed and b) the [facility] team listed Log into [email or web address] and use [place]'s universal password to access. If you do not have the password, please contact the hospital operator. Locate the [facility] provider you are looking for under [facility] and page to a number that you can be directly reached. If you still have difficulty reaching the provider, please page the [facility] (Director on Call) for the Hospitalists listed on amion for assistance.   07/22/2020, 6:01 PM

## 2020-07-22 NOTE — Telephone Encounter (Signed)
Wife calls in reporting pt is still having CP, worsened since this morning. States that went to ED and they sent pt home but he is worse than he was this morning. She is concerned b/c he has hx of CABG and pt is nauseous/gagging. Informed that hospital MDs do not suspect cardiac related but due to reported symptoms and worsening issue, advised to go back to ED for further evaluation. Advised if CP worse than this morning then should call 911 and be transported via ambulance.  Wife agreeable to plan

## 2020-07-22 NOTE — ED Notes (Signed)
Report given to [place] rn on 5w

## 2020-07-22 NOTE — ED Triage Notes (Signed)
Pt arrive POV from home for c/o left side cp. Pt left without been seen and returned because pain started getting bad again.

## 2020-07-22 NOTE — H&P (Signed)
History and Physical    Philip Kerr [id number] DOB: 03-27-1933 DOA: 07/22/2020  PCP: Biagio Borg, MD   Patient coming from: Home  Chief Complaint: Chest pain, nausea   HPI: Philip Kerr is a 87 y.o. male with medical history significant for coronary artery disease with CABG in 1996, ischemic cardiomyopathy, type 2 diabetes mellitus, hypertension, and dementia, now presenting to the emergency department with chest pain.  Patient reports a chronic cough that may have been worsening over the past 3 or 4 days but became concerned when he developed severe chest pain while at rest last night.  He was evaluated in the emergency department for the chest pain earlier today, had minimal and stable elevation in troponin, no significant change in EKG, was seen by cardiology, and his symptoms were suspected to be noncardiac.  After returning home, he continued to have worsening in his chest pain, nausea, cough, and return to the emergency department.  Patient and his wife had both been experiencing fever, nausea, and severe fatigue approximately 3 weeks ago and the patient felt that he had almost completely recovered from that before the onset of the current symptoms.  ED Course: Upon arrival to the ED, patient is found to be afebrile, saturating 90s on room air, and with stable blood pressure.  EKG with sinus rhythm and chronic LBBB.  Chest x-ray with increasing consolidation at the left base concerning for pneumonia.  Chemistry panel with glucose 214 and creatinine 1.26.  CBC notable for mild macrocytic anemia and new leukocytosis to 16,200.  Patient was treated with Rocephin and doxycycline in the ED.  Review of Systems:  All other systems reviewed and apart from HPI, are negative.  Past Medical History:  Diagnosis Date   Aortic insufficiency    Coronary artery disease    coronary artery bypass graft x 5 in 1996   Diabetes mellitus    Hypercholesterolemia    non-insulin dependent    Hypertension    Ischemic cardiomyopathy    Mitral regurgitation    PVC (premature ventricular contraction)     Past Surgical History:  Procedure Laterality Date   CORONARY ARTERY BYPASS GRAFT     LUNG SURGERY     OTHER SURGICAL HISTORY     percutaneous coronary intervention of the  posterolateral segment on 01/27/1998    Social History:   reports that he has never smoked. He has never used smokeless tobacco. He reports that he does not drink alcohol and does not use drugs.  No Known Allergies  Family History  Problem Relation Age of Onset   CAD Neg Hx      Prior to Admission medications   Medication Sig Start Date End Date Taking? Authorizing Provider  albuterol (PROVENTIL HFA;VENTOLIN HFA) 108 (90 Base) MCG/ACT inhaler Inhale 2 puffs into the lungs every 6 (six) hours as needed for wheezing or shortness of breath. Patient not taking: Reported on 07/22/2020 02/18/18   Biagio Borg, MD  amLODipine (NORVASC) 5 MG tablet Take 1 tablet (5 mg total) by mouth daily. 12/17/19   Burnell Blanks, MD  aspirin 81 MG tablet Take 81 mg by mouth daily.    [provider]  citalopram (CELEXA) 10 MG tablet Take 1 tablet (10 mg total) by mouth daily. Patient not taking: No sig reported 09/18/17 07/22/20  Biagio Borg, MD  doxazosin (CARDURA) 2 MG tablet TAKE 1 TABLET (2 MG TOTAL) BY MOUTH AT BEDTIME. 11/17/19   Biagio Borg, MD  furosemide (LASIX) 20 MG tablet TAKE 1 TABLET (20 MG TOTAL) BY MOUTH DAILY AS NEEDED (FOR SWELLING). Patient taking differently: Take 20 mg by mouth daily as needed (swelling). 03/14/20   Burnell Blanks, MD  losartan (COZAAR) 50 MG tablet Take 1 tablet (50 mg total) by mouth daily. 05/24/20   Burnell Blanks, MD  metoprolol tartrate (LOPRESSOR) 50 MG tablet TAKE 0.5 TABLET (25 MG TOTAL) BY MOUTH 2 TIMES DAILY Patient taking differently: Take 25 mg by mouth 2 (two) times daily. 12/25/19   Bhagat, Crista Luria, PA  pramipexole (MIRAPEX) 0.25 MG  tablet Take 1 tablet (0.25 mg total) by mouth at bedtime. Patient not taking: Reported on 07/22/2020 08/27/17   Biagio Borg, MD  QUEtiapine (SEROQUEL) 50 MG tablet Take 1 tablet (50 mg total) by mouth at bedtime. Patient not taking: Reported on 07/22/2020 12/22/19   Biagio Borg, MD  rosuvastatin (CRESTOR) 20 MG tablet Take 1 tablet (20 mg total) by mouth daily. 12/25/19   Leanor Kail, PA  tamsulosin (FLOMAX) 0.4 MG CAPS capsule TAKE 1 CAPSULE BY MOUTH EVERY DAY Patient not taking: Reported on 07/22/2020 01/29/18   Biagio Borg, MD  traZODone (DESYREL) 50 MG tablet TAKE 1/2 TO 1 TABLET BY MOUTH AT BEDTIME AS NEEDED FOR SLEEP Patient not taking: Reported on 07/22/2020 08/25/18   Biagio Borg, MD    Physical Exam: Vitals:   07/22/20 1800 07/22/20 1918 07/22/20 2012 07/22/20 2045  BP: (!) 166/66 (!) 179/59 (!) 161/66 (!) 148/72  Pulse: 90 79 (!) 24 85  Resp: (!) 22 18 (!) 25 19  Temp: 97.9 F (36.6 C)  97.6 F (36.4 C)   TempSrc:   Oral   SpO2: 97% 99% 98% 99%    Constitutional: NAD, calm  Eyes: PERTLA, lids and conjunctivae normal ENMT: Mucous membranes are moist. Posterior pharynx clear of any exudate or lesions.   Neck:  supple, no masses  Respiratory: Frequent cough, no wheezing. No accessory muscle use.  Cardiovascular: S1 & S2 heard, regular rate and rhythm. No extremity edema.   Abdomen: No distension, no tenderness, soft. Bowel sounds active.  Musculoskeletal: no clubbing / cyanosis. No joint deformity upper and lower extremities.   Skin: no significant rashes, lesions, ulcers. Warm, dry, well-perfused. Neurologic: CN 2-12 grossly intact. Sensation intact. Moving all extremities.  Psychiatric: Alert and oriented to person, place, and situation. Pleasant and cooperative.    Labs and Imaging on Admission: I have personally reviewed following labs and imaging studies  CBC: Recent Labs  Lab 07/22/20 0614 07/22/20 1805  WBC 10.1 16.2*  HGB 12.4* 12.7*  HCT 37.1*  37.5*  MCV 105.7* 103.6*  PLT 297 [id number]   Basic Metabolic Panel: Recent Labs  Lab 07/22/20 0614 07/22/20 1805  NA 136 135  K 4.1 4.5  CL 106 107  CO2 22 21*  GLUCOSE 125* 214*  BUN 23 23  CREATININE 1.44* 1.26*  CALCIUM 8.6* 8.7*   GFR: Estimated Creatinine Clearance: 43.4 mL/min (A) (by C-G formula based on SCr of 1.26 mg/dL (H)). Liver Function Tests: No results for input(s): AST, ALT, ALKPHOS, BILITOT, PROT, ALBUMIN in the last 168 hours. No results for input(s): LIPASE, AMYLASE in the last 168 hours. No results for input(s): AMMONIA in the last 168 hours. Coagulation Profile: No results for input(s): INR, PROTIME in the last 168 hours. Cardiac Enzymes: No results for input(s): CKTOTAL, CKMB, CKMBINDEX, TROPONINI in the last 168 hours. BNP (last 3 results) No results for input(s):  PROBNP in the last 8760 hours. HbA1C: No results for input(s): HGBA1C in the last 72 hours. CBG: No results for input(s): GLUCAP in the last 168 hours. Lipid Profile: No results for input(s): CHOL, HDL, LDLCALC, TRIG, CHOLHDL, LDLDIRECT in the last 72 hours. Thyroid Function Tests: No results for input(s): TSH, T4TOTAL, FREET4, T3FREE, THYROIDAB in the last 72 hours. Anemia Panel: No results for input(s): VITAMINB12, FOLATE, FERRITIN, TIBC, IRON, RETICCTPCT in the last 72 hours. Urine analysis:    Component Value Date/Time   COLORURINE YELLOW 07/17/2019 1022   APPEARANCEUR CLOUDY (A) 07/17/2019 1022   LABSPEC 1.017 07/17/2019 1022   PHURINE < OR = 5.0 07/17/2019 1022   GLUCOSEU NEGATIVE 07/17/2019 1022   GLUCOSEU NEGATIVE 02/18/2018 1216   HGBUR NEGATIVE 07/17/2019 1022   BILIRUBINUR NEGATIVE 02/18/2018 1216   BILIRUBINUR negative 08/23/2017 0922   KETONESUR NEGATIVE 07/17/2019 1022   PROTEINUR TRACE (A) 07/17/2019 1022   UROBILINOGEN 0.2 02/18/2018 1216   NITRITE NEGATIVE 07/17/2019 1022   LEUKOCYTESUR NEGATIVE 07/17/2019 1022   Sepsis  Labs: '@LABRCNTIP'$ (procalcitonin:4,lacticidven:4) ) Recent Results (from the past 240 hour(s))  Resp Panel by RT-PCR (Flu A&B, Covid) Nasopharyngeal Swab     Status: Abnormal   Collection Time: 07/22/20  7:43 AM   Specimen: Nasopharyngeal Swab; Nasopharyngeal(NP) swabs in vial transport medium  Result Value Ref Range Status   SARS Coronavirus 2 by RT PCR POSITIVE (A) NEGATIVE Final    Comment: RESULT CALLED TO, READ BACK BY AND VERIFIED WITH: RN L MEEKS [id number] (NOTE) SARS-CoV-2 target nucleic acids are DETECTED.  The SARS-CoV-2 RNA is generally detectable in upper respiratory specimens during the acute phase of infection. Positive results are indicative of the presence of the identified virus, but do not rule out bacterial infection or co-infection with other pathogens not detected by the test. Clinical correlation with patient history and other diagnostic information is necessary to determine patient infection status. The expected result is Negative.  Fact Sheet for Patients: EntrepreneurPulse.com.au  Fact Sheet for Healthcare Providers: IncredibleEmployment.be  This test is not yet approved or cleared by the [place] FDA and  has been authorized for detection and/or diagnosis of SARS-CoV-2 by FDA under an Emergency Use Authorization (EUA).  This EUA will remain in effect (meaning this test can be used)  for the duration of  the COVID-19 declaration under Section 564(b)(1) of the Act, 21 U.S.C. section 360bbb-3(b)(1), unless the authorization is terminated or revoked sooner.     Influenza A by PCR NEGATIVE NEGATIVE Final   Influenza B by PCR NEGATIVE NEGATIVE Final    Comment: (NOTE) The Xpert Xpress SARS-CoV-2/FLU/RSV plus assay is intended as an aid in the diagnosis of influenza from Nasopharyngeal swab specimens and should not be used as a sole basis for treatment. Nasal washings and aspirates are unacceptable for Xpert  Xpress SARS-CoV-2/FLU/RSV testing.  Fact Sheet for Patients: EntrepreneurPulse.com.au  Fact Sheet for Healthcare Providers: IncredibleEmployment.be  This test is not yet approved or cleared by the [place] FDA and has been authorized for detection and/or diagnosis of SARS-CoV-2 by FDA under an Emergency Use Authorization (EUA). This EUA will remain in effect (meaning this test can be used) for the duration of the COVID-19 declaration under Section 564(b)(1) of the Act, 21 U.S.C. section 360bbb-3(b)(1), unless the authorization is terminated or revoked.  Performed at [facility], [place] [address] [place], [place] [address]      Radiological Exams on Admission: DG Chest 2 View  Result Date:  07/22/2020 CLINICAL DATA:  87 year old male with chest pain. EXAM: CHEST - 2 VIEW COMPARISON:  Chest radiographs 09/06/2017. FINDINGS: AP and lateral views today. Chronic surgical clips and volume loss in the left hemithorax. Chronic pleural blunting or scarring at the costophrenic angle. Superimposed prior CABG. Mediastinal contours remain within normal limits. No pneumothorax, pulmonary edema, acute pleural effusion or pulmonary opacity. Overall lower lung volumes compared to 2019. Visualized tracheal air column is within normal limits. No acute osseous abnormality identified. Abdomen Calcified aortic atherosclerosis. Negative visible bowel gas pattern. IMPRESSION: 1. Lower lung volumes but otherwise stable chest since 2019 with evidence of prior partial left pneumonectomy. 2.  Aortic Atherosclerosis (ICD10-I70.0). Electronically Signed   By: Genevie Ann M.D.   On: 07/22/2020 06:43   DG Chest Port 1 View  Result Date: 07/22/2020 CLINICAL DATA:  Left-sided chest pain, COVID-19 positive EXAM: PORTABLE CHEST 1 VIEW COMPARISON:  07/22/2020 at 6:20 a.m. FINDINGS: Single frontal view of the chest demonstrates postsurgical changes from CABG. The cardiac silhouette is  stable. Since the previous exam, there is increased consolidation at the left lung base. Chronic postsurgical changes within the left hemithorax, with stable left pleural thickening. Right chest is clear. No pneumothorax. IMPRESSION: 1. Increasing consolidation at the left lung base concerning for pneumonia. Electronically Signed   By: Randa Ngo M.D.   On: 07/22/2020 20:11    EKG: Independently reviewed. Sinus rhythm, chronic LBBB.   Assessment/Plan   1. Bacterial pneumonia; COVID-19 positive  - Presents with atypical chest pain and found to have positive COVID-19 pcr, new leukocytosis, and new consolidation on CXR concerning for PNA  - Presentation most consistent with recent COVID-19 infection and now secondary bacterial pneumonia  - Continue antibiotics with Rocephin and doxycycline, check/trend inflammatory markers, continue isolation for now    2. Chest pain; CAD  - Presents with chest pain at rest, had stable mild troponin elevation, and was seen by cardiology in the ED with low-suspicion for cardiac etiology  - He has now developed a leukocytosis and increasing consolidation on CXR and his complaints are likely due to PNA  - Continue ASA, statin, beta-blocker   3. Chronic systolic CHF  - EF was [id number] with severe LVH, diffuse hypokinesis, mild AI, moderate MR, and moderate LA enlargement in 2019  - Appears compensated  - Continue beta-blocker and ARB, monitor volume status    4. CKD IIIa  - SCr is 1.26 on admission; appears to be at baseline  - Renally-dose medications, monitor    5. Hypertension  - Continue losartan and metoprolol    6. Type II DM  - Serum glucose is 214 in ED; A1c was 6.5% in December 2021  - He is diet-controlled at home  - Monitor CBGs and use low-intensity SSI if needed   7. Dementia   - Delirium precautions    DVT prophylaxis: Lovenox   Code Status: DNR, confirmed with patient   Level of Care: Level of care: Telemetry Cardiac Family  Communication: wife updated by phone   Disposition Plan:  Patient is from: Home  Anticipated d/c is to: Home  Anticipated d/c date is: 07/25/20 Patient currently: Pending improvement/stability with treatment of pneumonia  Consults called: None  Admission status: Inpatient; PORT/PSI score is 94 and inpatient management is recommended     Vianne Bulls, MD [facility]  07/22/2020, 9:31 PM

## 2020-07-22 NOTE — Discharge Instructions (Addendum)
At this time there does not appear to be the presence of an emergent medical condition, however there is always the potential for conditions to change. Please read and follow the below instructions.  Please return to the Emergency Department immediately for any new or worsening symptoms. Please be sure to follow up with your Primary Care Provider within one week regarding your visit today; please call their office to schedule an appointment even if you are feeling better for a follow-up visit. Please call your cardiology appointment.  Please follow your cardiologist recommendations. Your COVID test was positive today, please quarantine to avoid spread of illness to others.  Go to the nearest Emergency Department immediately if: You have fever or chills You have a cough that gets worse, or you cough up blood. You have very bad (severe) pain in your belly (abdomen). You pass out (faint). You have either of these for no clear reason: Sudden chest discomfort. Sudden discomfort in your arms, back, neck, or jaw. You have shortness of breath at any time. You suddenly start to sweat, or your skin gets clammy. You feel sick to your stomach (nauseous). You throw up (vomit). You suddenly feel lightheaded or dizzy. You feel very weak or tired. Your heart starts to beat fast, or it feels like it is skipping beats. You have any new/concerning or worsening of symptoms.    Please read the additional information packets attached to your discharge summary.  Do not take your medicine if  develop an itchy rash, swelling in your mouth or lips, or difficulty breathing; call 911 and seek immediate emergency medical attention if this occurs.  You may review your lab tests and imaging results in their entirety on your MyChart account.  Please discuss all results of fully with your primary care provider and other specialist at your follow-up visit.  Note: Portions of this text may have been transcribed using  voice recognition software. Every effort was made to ensure accuracy; however, inadvertent computerized transcription errors may still be present.

## 2020-07-22 NOTE — ED Provider Notes (Signed)
Emergency Medicine Provider Triage Evaluation Note  Philip Kerr , a 87 y.o. male  was evaluated in triage.  Pt complains of chest pain. Was seen earlier today in the ED for this complaint. States it initially improved but worsened so he returned to the ED. History of CABG with stent placement.   Review of Systems  Positive: Chest pain Negative: Nausea, vomiting   Physical Exam  There were no vitals taken for this visit. Gen:   Awake, no distress   Resp:  Normal effort  MSK:   Moves extremities without difficulty  Other:    Medical Decision Making  Medically screening exam initiated at 5:58 PM.  Appropriate orders placed.  Philip Kerr was informed that the remainder of the evaluation will be completed by another provider, this initial triage assessment does not replace that evaluation, and the importance of remaining in the ED until their evaluation is complete.  Will repeat labs because symptoms have worsened.    Sherrill Raring, PA-C 07/22/20 1800    Drenda Freeze, MD 07/22/20 [phone]

## 2020-07-22 NOTE — Consult Note (Addendum)
Cardiology Consultation:   Patient ID: Philip Kerr MRN: [id number]; DOB: 1933/05/23  Admit date: 07/22/2020 Date of Consult: 07/22/2020  PCP:  Biagio Borg, MD   [facility] Providers Cardiologist:  Lauree Chandler, MD       Patient Profile:   Philip Kerr is a 87 y.o. male with a hx of CAD s/p CABG x5V in 1996, chronic LBBB, ischemic CM, systolic and diastolic heart failure, mitral regurgitation, AI, HTN, HLD, type 2 DM, CKD stage III, BPH, childhood left lobectomy, who is being seen 07/22/2020 for the evaluation of chest pain at the request of Nuala Alpha PA.   History of Present Illness:   Philip Kerr follows Dr Angelena Kerr outpatient for CAD and ischemic CM. He saw Dr. Lia Foyer prior to that.  He underwent CABG for 5V in 1996, no records available for review. EF was 35% in 2014 Echo. Myoveiw from 05/21/2012 showed no definite ischemia, but the inferior/posterior wall is thin. ? Silent graft occlusion. He refused cardiac cath for further evaluation.  He saw NP Cecille Rubin for sometime 2016-2019.  Holter monitor in 2015 showed frequent PVCs. He was not able to tolerate high dose BB due to bradycardia. Echo repeat on 10/28/2013 showed EF 35-40%, moderate concentric LVH, wall motion abnormalities,  mildly dilated ascending aorta 41 mm, trivial AI, moderate Philip, mild TR. He  refused ICD evaluation and adding more meds for therapy at the time.   He had complained syncope in 2019, was concerned for cardiac events,  but patient refused any further workup. Repeat Echo was done 09/11/2017 which showed EF 25-30%, severe LVH, diffuse hypokinesis, mild AI, moderate Philip, LA moderately dilated. He remained to wish no ICD evaluation, continued on ASA, statin, lopressor, losartan, and PRN Lasix for medical therapy.    He was last seen in the office on 11/19/19 by APP Philip Kerr for follow up, reportedly had BP 170 -180s for 1 month, does 80 pushup daily, had no angina or CHF symptoms/complaints. His  losartan was increased to '75mg'$  for HTN, rarely need to take lasix.  He was not interested for Philip intervention. Subsequent lab showed elevated K 5.9 on 05/20/20, he reports self increasing losartan to '100mg'$  daily for 2 month, he ws advised holding the medication for 2 days and go back to '75mg'$  daily by telephone message. Repeat labs showed resolved hyperkalemia.   He came to ER 07/22/20 morning for chest pain. Pain started around 9PM the night before 07/21/20. He was laying in bed and was not engaged in any exertional activity before. He states pain is located in the center of his chest, not radiating. He states pain was severe and he thought he had a heart attack.  He has not been experiencing similar chest pains lately.  He took baby aspirin at home without any relief.  He recalls that pain lasted until 9 AM today he arrived to ER.  He is not sure if the pain resolved after he received aspirin and nitro.  He states currently he is pain-free.  He denied any associated shortness of breath, dizziness, nausea, vomiting, diaphoresis.  He is asking if he can go home now.  He remains not interested for any invasive evaluation such as heart catheterization.  He is okay with repeating echocardiogram to assess his CHF.  He takes his medication daily, his daughter is a Software engineer who is helping his meds.  He denies any significant weight gain, worsening of chronic shortness of breath/cough, leg edema, fever, chills.  Diagnostic workup here showed CKD III with Cr 1.44 and GFR 48. CBC with Hgb 1.24. Hs trop 28 >24. COVID -19 positive. Flu negative. CXR showed lower lung volumes but otherwise stable chest since 2019 with evidence of prior partial left pneumonectomy. EKG showed no acute ischemic changes. He is HD stable at ED. He was ASA '325mg'$  and SL Nitro, cardiology is consulted subsequently.    Past Medical History:  Diagnosis Date   Aortic insufficiency    Coronary artery disease    coronary artery bypass graft x 5 in  1996   Diabetes mellitus    Hypercholesterolemia    non-insulin dependent   Hypertension    Ischemic cardiomyopathy    Mitral regurgitation    PVC (premature ventricular contraction)     Past Surgical History:  Procedure Laterality Date   CORONARY ARTERY BYPASS GRAFT     LUNG SURGERY     OTHER SURGICAL HISTORY     percutaneous coronary intervention of the  posterolateral segment on 01/27/1998     Home Medications:  Prior to Admission medications   Medication Sig Start Date End Date Taking? Authorizing Provider  albuterol (PROVENTIL HFA;VENTOLIN HFA) 108 (90 Base) MCG/ACT inhaler Inhale 2 puffs into the lungs every 6 (six) hours as needed for wheezing or shortness of breath. 02/18/18   Biagio Borg, MD  amLODipine (NORVASC) 5 MG tablet Take 1 tablet (5 mg total) by mouth daily. 12/17/19   Burnell Blanks, MD  aspirin 81 MG tablet Take 81 mg by mouth daily.    [provider]  citalopram (CELEXA) 10 MG tablet Take 1 tablet (10 mg total) by mouth daily. 09/18/17 09/18/18  Biagio Borg, MD  doxazosin (CARDURA) 2 MG tablet TAKE 1 TABLET (2 MG TOTAL) BY MOUTH AT BEDTIME. 11/17/19   Biagio Borg, MD  furosemide (LASIX) 20 MG tablet TAKE 1 TABLET (20 MG TOTAL) BY MOUTH DAILY AS NEEDED (FOR SWELLING). 03/14/20   Burnell Blanks, MD  losartan (COZAAR) 50 MG tablet Take 1 tablet (50 mg total) by mouth daily. 05/24/20   Burnell Blanks, MD  metoprolol tartrate (LOPRESSOR) 50 MG tablet TAKE 0.5 TABLET (25 MG TOTAL) BY MOUTH 2 TIMES DAILY 12/25/19   Bhagat, Neola, PA  pramipexole (MIRAPEX) 0.25 MG tablet Take 1 tablet (0.25 mg total) by mouth at bedtime. 08/27/17   Biagio Borg, MD  predniSONE (DELTASONE) 10 MG tablet 1 tabs by mouth per day for 5 days 12/22/19   Biagio Borg, MD  QUEtiapine (SEROQUEL) 50 MG tablet Take 1 tablet (50 mg total) by mouth at bedtime. 12/22/19   Biagio Borg, MD  rosuvastatin (CRESTOR) 20 MG tablet Take 1 tablet (20 mg total) by mouth  daily. 12/25/19   Leanor Kail, PA  tamsulosin (FLOMAX) 0.4 MG CAPS capsule TAKE 1 CAPSULE BY MOUTH EVERY DAY 01/29/18   Biagio Borg, MD  traZODone (DESYREL) 50 MG tablet TAKE 1/2 TO 1 TABLET BY MOUTH AT BEDTIME AS NEEDED FOR SLEEP 08/25/18   Biagio Borg, MD  vitamin C (ASCORBIC ACID) 500 MG tablet Take 500 mg by mouth daily.    [provider]    Inpatient Medications: Scheduled Meds:  Continuous Infusions:  PRN Meds: nitroGLYCERIN  Allergies:   No Known Allergies  Social History:   Social History   Socioeconomic History   Marital status: Married    Spouse name: Not on file   Number of children: 1   Years of education:  Not on file   Highest education level: Not on file  Occupational History   Occupation: Runs a Acupuncturist: SELF-EMPLOYED  Tobacco Use   Smoking status: Never   Smokeless tobacco: Never  Vaping Use   Vaping Use: Never used  Substance and Sexual Activity   Alcohol use: No   Drug use: No   Sexual activity: Not Currently  Other Topics Concern   Not on file  Social History Narrative   Not on file   Social Determinants of Health   Financial Resource Strain: Not on file  Food Insecurity: Not on file  Transportation Needs: Not on file  Physical Activity: Not on file  Stress: Not on file  Social Connections: Not on file  Intimate Partner Violence: Not on file    Family History:    Family History  Problem Relation Age of Onset   CAD Neg Hx      ROS:  Constitutional: Denied fever, chills, malaise, night sweats Eyes: Denied vision change or loss Ears/Nose/Mouth/Throat: Denied ear ache, sore throat, sinus pain Cardiovascular: see HPI Respiratory: Chronic cough and shortness of breath Gastrointestinal: Denied nausea, vomiting, abdominal pain, diarrhea Genital/Urinary: Denied dysuria, hematuria, urinary frequency/urgency Musculoskeletal: Denied muscle ache, joint pain, weakness Skin: Denied rash, wound Neuro: Denied  headache, dizziness, syncope Psych: history of depression/anxiety  Endocrine: history of diabetes   Physical Exam/Data:   Vitals:   07/22/20 0930 07/22/20 1000 07/22/20 1100 07/22/20 1130  BP: (!) 131/55 (!) 136/59 (!) 143/58 (!) 144/65  Pulse: 64 65 72 77  Resp: (!) '22 20 19 18  '$ Temp:      TempSrc:      SpO2: 99% 98% 100% 99%  Weight:      Height:       No intake or output data in the 24 hours ending 07/22/20 1216 Last 3 Weights 07/22/2020 12/22/2019 11/19/2019  Weight (lbs) 155 lb 179 lb 180 lb 3.2 oz  Weight (kg) 70.308 kg 81.194 kg 81.738 kg     Body mass index is 22.24 kg/m.   Vitals:  Vitals:   07/22/20 1100 07/22/20 1130  BP: (!) 143/58 (!) 144/65  Pulse: 72 77  Resp: 19 18  Temp:    SpO2: 100% 99%   General Appearance: In no apparent distress, laying in bed HEENT: Normocephalic, atraumatic. Neck: Supple, trachea midline, no JVDs Cardiovascular: Regular rate and rhythm, systolic murmur grade II L/R SB noted  Respiratory: Resting breathing unlabored, lungs sounds clear to auscultation bilaterally, no use of accessory muscles. On room air.  No wheezes, rales or rhonchi.   Gastrointestinal: Bowel sounds positive, abdomen soft, non-tender, non-distended.  Extremities: Able to move all extremities in bed without difficulty, no edema/cyanosis/clubbing Genitourinary: genital exam not performed Musculoskeletal: Normal muscle bulk and tone, muscle strength 5/5 throughout Skin: Intact, warm, dry. No rashes or petechiae noted in exposed areas.  Neurologic: Alert, oriented to person, place and time. Fluent speech, no cognitive deficit,  no gross focal neuro deficit Psychiatric: Normal affect. Mood is appropriate.    EKG:  The EKG was personally reviewed and demonstrates:  sinus bradycardia 53 bpm, old LBBB, no significantly changed EKG when comparing to previous  EKG  2021 and 2020   Telemetry:  Telemetry was personally reviewed and demonstrates:  sinus rhythm with  occasional PVCs  Relevant CV Studies:  Echo on 09/11/2017:  - Left ventricle: The cavity size was moderately dilated. Wall    thickness was increased in a pattern of  severe LVH. Systolic    function was severely reduced. The estimated ejection fraction    was in the range of 25% to 30%. Diffuse hypokinesis. Left    ventricular diastolic function parameters were normal.  - Aortic valve: There was mild regurgitation.  - Mitral valve: There was moderate regurgitation.  - Left atrium: The atrium was moderately dilated.  - Atrial septum: No defect or patent foramen ovale was identified   Laboratory Data:  High Sensitivity Troponin:   Recent Labs  Lab 07/22/20 0614 07/22/20 0840  TROPONINIHS 28* 24*     Chemistry Recent Labs  Lab 07/22/20 0614  NA 136  K 4.1  CL 106  CO2 22  GLUCOSE 125*  BUN 23  CREATININE 1.44*  CALCIUM 8.6*  GFRNONAA 48*  ANIONGAP 8    No results for input(s): PROT, ALBUMIN, AST, ALT, ALKPHOS, BILITOT in the last 168 hours. Hematology Recent Labs  Lab 07/22/20 0614  WBC 10.1  RBC 3.51*  HGB 12.4*  HCT 37.1*  MCV 105.7*  MCH 35.3*  MCHC 33.4  RDW 12.7  PLT 297   BNPNo results for input(s): BNP, PROBNP in the last 168 hours.  DDimer No results for input(s): DDIMER in the last 168 hours.   Radiology/Studies:  DG Chest 2 View  Result Date: 07/22/2020 CLINICAL DATA:  87 year old male with chest pain. EXAM: CHEST - 2 VIEW COMPARISON:  Chest radiographs 09/06/2017. FINDINGS: AP and lateral views today. Chronic surgical clips and volume loss in the left hemithorax. Chronic pleural blunting or scarring at the costophrenic angle. Superimposed prior CABG. Mediastinal contours remain within normal limits. No pneumothorax, pulmonary edema, acute pleural effusion or pulmonary opacity. Overall lower lung volumes compared to 2019. Visualized tracheal air column is within normal limits. No acute osseous abnormality identified. Abdomen Calcified aortic  atherosclerosis. Negative visible bowel gas pattern. IMPRESSION: 1. Lower lung volumes but otherwise stable chest since 2019 with evidence of prior partial left pneumonectomy. 2.  Aortic Atherosclerosis (ICD10-I70.0). Electronically Signed   By: Genevie Ann M.D.   On: 07/22/2020 06:43     Assessment and Plan:   Chest pain Elevated Troponin  CAD with hx of CABG x5 V 1996 - presented with chest pain started at [facility] 07/21/20, atypical  - Hs trop 28 >24  - EKG without acute ischemic changes - ACS has been ruled out  - he had refused cath in the past and still not interested for invasive test  - recommend continue medical therapy with ASA '81mg'$ , metoprolol '25mg'$  BID, Crestor '20mg'$ , and losartan '75mg'$  daily  - follow up outpatient for re-evaluation if chest pain recurrent,  arranged appt on [id number]  Chronic systolic and diastolic heart failure Ischemic cardiomyopathy  - EF last reported 25-30% on 2019 - CXR without acute findings - will check BNP - clinically euvolemic  - continue PRN lasix, rarely required at home  - GDMT: continue metoprolol, Losartan, historically refused adding more meds and ICD evaluation - will update Echo, patient agreeable   Moderate Philip  - will update Echo fore re-evaluation  - he has refused intervention historically   HTN - BP controlled - continue home regimen with amlodipine, losartan, PRN Lasix, metoprolol   HLD - last lipid from 12/22/19 with triglyceride 184 and LDL 18 - continue crestor   COVID-19 infection  - no oxygen dependent - CXR without pneumonia - unclear if this is contributing to his chest discomfort   Type 2 DM Depression  BPH - managed per IM  Risk Assessment/Risk Scores:   HEAR Score (for undifferentiated chest pain):  HEAR Score: 4  New York Heart Association (NYHA) Functional Class NYHA Class I        For questions or updates, please contact [facility] Please consult [email or web address] for contact info under     Signed, Margie Billet, NP  07/22/2020 12:16 PM

## 2020-07-22 NOTE — ED Provider Notes (Signed)
[facility] Provider Note   CSN: [id number] Arrival date & time: 07/22/20  1743     History Chief Complaint  Patient presents with   Chest Pain    [facility] is a 87 y.o. male with history of ischemic cardiomyopathy, diabetes, aortic insufficiency, coronary disease status post CABG x 5, partial lobectomy, presented emergency department with chest pain.  The patient was evaluated earlier this day for left-sided chest pain.  He was seen by cardiology and had serial troponins, had resolution of his pain and was discharged home.  He returns today complaining of returning and worsening of his pain.  It is a pressure discomfort sensation in his epigastrium and left chest.  He reports he has a chronic cough and feels may be worse now.  He reports objective fevers for the past few days at home.  He reports a prior history of pneumonia but is unsure whether this feels the same.  His chest pain is currently moderate to high intensity.  HPI     Past Medical History:  Diagnosis Date   Aortic insufficiency    Coronary artery disease    coronary artery bypass graft x 5 in 1996   Diabetes mellitus    Hypercholesterolemia    non-insulin dependent   Hypertension    Ischemic cardiomyopathy    Mitral regurgitation    PVC (premature ventricular contraction)     Patient Active Problem List   Diagnosis Date Noted   Chest pain 07/22/2020   Lab test positive for detection of COVID-19 virus 07/22/2020   Pneumonia 07/22/2020   Chronic kidney disease, stage 3a ([place]) 12/24/2019   Scalp laceration, subsequent encounter 05/30/2019   Slow urinary stream 09/18/2017   Dizzy 09/18/2017   Dementia with behavioral disturbance ([place]) 09/18/2017   Low testosterone in male 09/18/2017   Depression 09/18/2017   Anemia 09/18/2017   Vitamin D deficiency 09/18/2017   Weight loss 09/18/2017   Fatigue 09/06/2017   RLS (restless legs syndrome) 08/27/2017   Rash  08/27/2017   Dysuria 08/23/2017   Insomnia 08/23/2017   Preventative health care 0000000   Chronic systolic CHF (congestive heart failure) ([place]) 06/13/2012   Murmur 04/21/2012   Coronary artery disease 04/05/2011   Diabetes mellitus type II, non insulin dependent ([employer]) 01/21/2009   Essential hypertension 01/21/2009   HYPERCHOLESTEROLEMIA 01/20/2009   CAD, ARTERY BYPASS GRAFT 01/20/2009    Past Surgical History:  Procedure Laterality Date   CORONARY ARTERY BYPASS GRAFT     LUNG SURGERY     OTHER SURGICAL HISTORY     percutaneous coronary intervention of the  posterolateral segment on 01/27/1998       Family History  Problem Relation Age of Onset   CAD Neg Hx     Social History   Tobacco Use   Smoking status: Never   Smokeless tobacco: Never  Vaping Use   Vaping Use: Never used  Substance Use Topics   Alcohol use: No   Drug use: No    Home Medications Prior to Admission medications   Medication Sig Start Date End Date Taking? Authorizing Provider  Ascorbic Acid (VITAMIN C PO) Take 1 capsule by mouth at bedtime.   Yes [provider]  aspirin EC 81 MG tablet Take 81 mg by mouth every morning. Swallow whole.   Yes [provider]  Cholecalciferol (VITAMIN D3 PO) Take 1 capsule by mouth at bedtime.   Yes [provider]  losartan (COZAAR) 50 MG  tablet Take 1 tablet (50 mg total) by mouth daily. Patient taking differently: Take 25 mg by mouth 2 (two) times daily. 05/24/20  Yes Burnell Blanks, MD  metoprolol tartrate (LOPRESSOR) 50 MG tablet TAKE 0.5 TABLET (25 MG TOTAL) BY MOUTH 2 TIMES DAILY Patient taking differently: Take 25 mg by mouth 2 (two) times daily. 12/25/19  Yes Bhagat, Bhavinkumar, PA  rosuvastatin (CRESTOR) 20 MG tablet Take 1 tablet (20 mg total) by mouth daily. Patient taking differently: Take 20 mg by mouth at bedtime. 12/25/19  Yes Bhagat, Bhavinkumar, PA  albuterol (PROVENTIL HFA;VENTOLIN HFA) 108 (90 Base) MCG/ACT  inhaler Inhale 2 puffs into the lungs every 6 (six) hours as needed for wheezing or shortness of breath. Patient not taking: No sig reported 02/18/18   Biagio Borg, MD  amLODipine (NORVASC) 5 MG tablet Take 1 tablet (5 mg total) by mouth daily. 12/17/19   Burnell Blanks, MD  citalopram (CELEXA) 10 MG tablet Take 1 tablet (10 mg total) by mouth daily. Patient not taking: No sig reported 09/18/17 07/22/20  Biagio Borg, MD  doxazosin (CARDURA) 2 MG tablet TAKE 1 TABLET (2 MG TOTAL) BY MOUTH AT BEDTIME. 11/17/19   Biagio Borg, MD  furosemide (LASIX) 20 MG tablet TAKE 1 TABLET (20 MG TOTAL) BY MOUTH DAILY AS NEEDED (FOR SWELLING). Patient taking differently: Take 20 mg by mouth daily as needed (swelling). 03/14/20   Burnell Blanks, MD  pramipexole (MIRAPEX) 0.25 MG tablet Take 1 tablet (0.25 mg total) by mouth at bedtime. Patient not taking: No sig reported 08/27/17   Biagio Borg, MD  QUEtiapine (SEROQUEL) 50 MG tablet Take 1 tablet (50 mg total) by mouth at bedtime. Patient not taking: No sig reported 12/22/19   Biagio Borg, MD  tamsulosin (FLOMAX) 0.4 MG CAPS capsule TAKE 1 CAPSULE BY MOUTH EVERY DAY Patient not taking: No sig reported 01/29/18   Biagio Borg, MD  traZODone (DESYREL) 50 MG tablet TAKE 1/2 TO 1 TABLET BY MOUTH AT BEDTIME AS NEEDED FOR SLEEP Patient not taking: No sig reported 08/25/18   Biagio Borg, MD    Allergies    Patient has no known allergies.  Review of Systems   Review of Systems  Constitutional:  Positive for chills and fever.  Eyes:  Negative for photophobia and visual disturbance.  Respiratory:  Positive for shortness of breath. Negative for cough.   Cardiovascular:  Positive for chest pain. Negative for palpitations.  Gastrointestinal:  Negative for abdominal pain and vomiting.  Genitourinary:  Negative for dysuria and hematuria.  Musculoskeletal:  Negative for arthralgias and back pain.  Skin:  Negative for color change and rash.   Neurological:  Negative for seizures and syncope.  All other systems reviewed and are negative.  Physical Exam Updated Vital Signs BP (!) 130/55   Pulse 81   Temp 98.6 F (37 C)   Resp (!) 24   SpO2 98%   Physical Exam Constitutional:      General: He is not in acute distress. HENT:     Head: Normocephalic and atraumatic.  Eyes:     Conjunctiva/sclera: Conjunctivae normal.     Pupils: Pupils are equal, round, and reactive to light.  Cardiovascular:     Rate and Rhythm: Normal rate and regular rhythm.  Pulmonary:     Effort: Pulmonary effort is normal. No respiratory distress.  Abdominal:     General: There is no distension.     Tenderness: There  is no abdominal tenderness.  Skin:    General: Skin is warm and dry.  Neurological:     General: No focal deficit present.     Mental Status: He is alert. Mental status is at baseline.  Psychiatric:        Mood and Affect: Mood normal.        Behavior: Behavior normal.    ED Results / Procedures / Treatments   Labs (all labs ordered are listed, but only abnormal results are displayed) Labs Reviewed  BASIC METABOLIC PANEL - Abnormal; Notable for the following components:      Result Value   CO2 21 (*)    Glucose, Bld 214 (*)    Creatinine, Ser 1.26 (*)    Calcium 8.7 (*)    GFR, Estimated 56 (*)    All other components within normal limits  CBC - Abnormal; Notable for the following components:   WBC 16.2 (*)    RBC 3.62 (*)    Hemoglobin 12.7 (*)    HCT 37.5 (*)    MCV 103.6 (*)    MCH 35.1 (*)    All other components within normal limits  D-DIMER, QUANTITATIVE - Abnormal; Notable for the following components:   D-Dimer, Quant 1.56 (*)    All other components within normal limits  LACTATE DEHYDROGENASE - Abnormal; Notable for the following components:   LDH 1,501 (*)    All other components within normal limits  CBG MONITORING, ED - Abnormal; Notable for the following components:   Glucose-Capillary 200 (*)     All other components within normal limits  TROPONIN I (HIGH SENSITIVITY) - Abnormal; Notable for the following components:   Troponin I (High Sensitivity) 27 (*)    All other components within normal limits  TROPONIN I (HIGH SENSITIVITY) - Abnormal; Notable for the following components:   Troponin I (High Sensitivity) 32 (*)    All other components within normal limits  PROCALCITONIN  C-REACTIVE PROTEIN  FERRITIN  COMPREHENSIVE METABOLIC PANEL  CBC WITH DIFFERENTIAL/PLATELET  C-REACTIVE PROTEIN  D-DIMER, QUANTITATIVE  FERRITIN  MAGNESIUM  PHOSPHORUS  LEGIONELLA PNEUMOPHILA SEROGP 1 UR AG  STREP PNEUMONIAE URINARY ANTIGEN  HEMOGLOBIN A1C    EKG None  Radiology DG Chest 2 View  Result Date: 07/22/2020 CLINICAL DATA:  87 year old male with chest pain. EXAM: CHEST - 2 VIEW COMPARISON:  Chest radiographs 09/06/2017. FINDINGS: AP and lateral views today. Chronic surgical clips and volume loss in the left hemithorax. Chronic pleural blunting or scarring at the costophrenic angle. Superimposed prior CABG. Mediastinal contours remain within normal limits. No pneumothorax, pulmonary edema, acute pleural effusion or pulmonary opacity. Overall lower lung volumes compared to 2019. Visualized tracheal air column is within normal limits. No acute osseous abnormality identified. Abdomen Calcified aortic atherosclerosis. Negative visible bowel gas pattern. IMPRESSION: 1. Lower lung volumes but otherwise stable chest since 2019 with evidence of prior partial left pneumonectomy. 2.  Aortic Atherosclerosis (ICD10-I70.0). Electronically Signed   By: Genevie Ann M.D.   On: 07/22/2020 06:43   DG Chest Port 1 View  Result Date: 07/22/2020 CLINICAL DATA:  Left-sided chest pain, COVID-19 positive EXAM: PORTABLE CHEST 1 VIEW COMPARISON:  07/22/2020 at 6:20 a.m. FINDINGS: Single frontal view of the chest demonstrates postsurgical changes from CABG. The cardiac silhouette is stable. Since the previous exam, there is  increased consolidation at the left lung base. Chronic postsurgical changes within the left hemithorax, with stable left pleural thickening. Right chest is clear. No pneumothorax. IMPRESSION: 1.  Increasing consolidation at the left lung base concerning for pneumonia. Electronically Signed   By: Randa Ngo M.D.   On: 07/22/2020 20:11    Procedures Procedures   Medications Ordered in ED Medications  doxycycline (VIBRAMYCIN) 100 mg in sodium chloride 0.9 % 250 mL IVPB (has no administration in time range)  guaiFENesin-dextromethorphan (ROBITUSSIN DM) 100-10 MG/5ML syrup 10 mL (has no administration in time range)  enoxaparin (LOVENOX) injection 40 mg (has no administration in time range)  acetaminophen (TYLENOL) tablet 650 mg (has no administration in time range)    Or  acetaminophen (TYLENOL) suppository 650 mg (has no administration in time range)  senna-docusate (Senokot-S) tablet 1 tablet (has no administration in time range)  ondansetron (ZOFRAN) tablet 4 mg (has no administration in time range)    Or  ondansetron (ZOFRAN) injection 4 mg (has no administration in time range)  cefTRIAXone (ROCEPHIN) 2 g in sodium chloride 0.9 % 100 mL IVPB (has no administration in time range)  doxycycline (VIBRAMYCIN) 100 mg in sodium chloride 0.9 % 250 mL IVPB (has no administration in time range)  insulin aspart (novoLOG) injection 0-9 Units (has no administration in time range)  insulin aspart (novoLOG) injection 0-5 Units (has no administration in time range)  metoprolol tartrate (LOPRESSOR) tablet 25 mg (has no administration in time range)  rosuvastatin (CRESTOR) tablet 20 mg (has no administration in time range)  losartan (COZAAR) tablet 25 mg (has no administration in time range)  aspirin EC tablet 81 mg (has no administration in time range)  cefTRIAXone (ROCEPHIN) 1 g in sodium chloride 0.9 % 100 mL IVPB (1 g Intravenous New Bag/Given 07/22/20 2237)    ED Course  I have reviewed the triage  vital signs and the nursing notes.  Pertinent labs & imaging results that were available during my care of the patient were reviewed by me and considered in my medical decision making (see chart for details).  Left sided chest pain Ddx includes ACS vs arrhythmia vs PTX vs PNA vs gastritis vs other Labs reviewed -WBC elevated, dg chest with new worsening LE consolidation This is consistent with developing PNA, which may explain his symptoms He is also Covid (+) from this morning  Troponins flat from this morning to now No new ecg changes I have a lower suspicion for ACS at this time.  He's been evaluated by cardiology already this morning.  Not a candidate for invasive cardiac interventions at this time.  Plan to admit on IV antibiotics for CAP Patient and his son in agreement with plan     Final Clinical Impression(s) / ED Diagnoses Final diagnoses:  Chest pain, unspecified type  Community acquired pneumonia of left lower lobe of lung    Rx / DC Orders ED Discharge Orders     None        Lorenza Shakir, Carola Rhine, MD 07/22/20 2317

## 2020-07-22 NOTE — ED Provider Notes (Addendum)
[facility] EMERGENCY DEPARTMENT Provider Note   CSN: [id number] Arrival date & time: 07/22/20  0556     History Chief Complaint  Patient presents with   Chest Pain    Philip Kerr is a 87 y.o. male history of CABG 1996, diabetes, hypercholesterolemia, hypertension, mitral regurg, ischemic cardiomyopathy, CKD stage III.  Patient presents for chest pain onset 9 PM last night, no clear inciting event, described as moderate-severe intensity central chest pain dull nonradiating no clear aggravating or alleviating factors, no medications prior to arrival.  Patient reports that he had his son drive him here today.  Patient reports chronic shortness of breath since pulm ectomy but unchanged.  Denies recent illness, fever/chills, cough, hemoptysis, new shortness of breath, extremity swelling/color change, abdominal pain, nausea/vomiting, diarrhea or any additional concerns.  HPI     Past Medical History:  Diagnosis Date   Aortic insufficiency    Coronary artery disease    coronary artery bypass graft x 5 in 1996   Diabetes mellitus    Hypercholesterolemia    non-insulin dependent   Hypertension    Ischemic cardiomyopathy    Mitral regurgitation    PVC (premature ventricular contraction)     Patient Active Problem List   Diagnosis Date Noted   CKD (chronic kidney disease) stage 3, GFR 30-59 ml/min ([place]) 12/24/2019   Scalp laceration, subsequent encounter 05/30/2019   Slow urinary stream 09/18/2017   Dizzy 09/18/2017   Dementia with behavioral disturbance ([place]) 09/18/2017   Low testosterone in male 09/18/2017   Depression 09/18/2017   Hyponatremia 09/18/2017   Hyperkalemia 09/18/2017   Anemia 09/18/2017   Vitamin D deficiency 09/18/2017   Weight loss 09/18/2017   Fatigue 09/06/2017   RLS (restless legs syndrome) 08/27/2017   Rash 08/27/2017   Dysuria 08/23/2017   Insomnia 08/23/2017   Preventative health care 11/27/2016   Cardiomyopathy,  ischemic 06/13/2012   Murmur 04/21/2012   Coronary artery disease 04/05/2011   Diabetes ([place]) 01/21/2009   Essential hypertension 01/21/2009   HYPERCHOLESTEROLEMIA 01/20/2009   CAD, ARTERY BYPASS GRAFT 01/20/2009    Past Surgical History:  Procedure Laterality Date   CORONARY ARTERY BYPASS GRAFT     LUNG SURGERY     OTHER SURGICAL HISTORY     percutaneous coronary intervention of the  posterolateral segment on 01/27/1998       Family History  Problem Relation Age of Onset   CAD Neg Hx     Social History   Tobacco Use   Smoking status: Never   Smokeless tobacco: Never  Vaping Use   Vaping Use: Never used  Substance Use Topics   Alcohol use: No   Drug use: No    Home Medications Prior to Admission medications   Medication Sig Start Date End Date Taking? Authorizing Provider  albuterol (PROVENTIL HFA;VENTOLIN HFA) 108 (90 Base) MCG/ACT inhaler Inhale 2 puffs into the lungs every 6 (six) hours as needed for wheezing or shortness of breath. 02/18/18   Biagio Borg, MD  amLODipine (NORVASC) 5 MG tablet Take 1 tablet (5 mg total) by mouth daily. 12/17/19   Burnell Blanks, MD  aspirin 81 MG tablet Take 81 mg by mouth daily.    [provider]  citalopram (CELEXA) 10 MG tablet Take 1 tablet (10 mg total) by mouth daily. 09/18/17 09/18/18  Biagio Borg, MD  doxazosin (CARDURA) 2 MG tablet TAKE 1 TABLET (2 MG TOTAL) BY MOUTH AT BEDTIME. 11/17/19   Biagio Borg,  MD  furosemide (LASIX) 20 MG tablet TAKE 1 TABLET (20 MG TOTAL) BY MOUTH DAILY AS NEEDED (FOR SWELLING). 03/14/20   Burnell Blanks, MD  losartan (COZAAR) 50 MG tablet Take 1 tablet (50 mg total) by mouth daily. 05/24/20   Burnell Blanks, MD  metoprolol tartrate (LOPRESSOR) 50 MG tablet TAKE 0.5 TABLET (25 MG TOTAL) BY MOUTH 2 TIMES DAILY 12/25/19   Bhagat, Morton, PA  pramipexole (MIRAPEX) 0.25 MG tablet Take 1 tablet (0.25 mg total) by mouth at bedtime. 08/27/17   Biagio Borg, MD   predniSONE (DELTASONE) 10 MG tablet 1 tabs by mouth per day for 5 days 12/22/19   Biagio Borg, MD  QUEtiapine (SEROQUEL) 50 MG tablet Take 1 tablet (50 mg total) by mouth at bedtime. 12/22/19   Biagio Borg, MD  rosuvastatin (CRESTOR) 20 MG tablet Take 1 tablet (20 mg total) by mouth daily. 12/25/19   Leanor Kail, PA  tamsulosin (FLOMAX) 0.4 MG CAPS capsule TAKE 1 CAPSULE BY MOUTH EVERY DAY 01/29/18   Biagio Borg, MD  traZODone (DESYREL) 50 MG tablet TAKE 1/2 TO 1 TABLET BY MOUTH AT BEDTIME AS NEEDED FOR SLEEP 08/25/18   Biagio Borg, MD  vitamin C (ASCORBIC ACID) 500 MG tablet Take 500 mg by mouth daily.    [provider]    Allergies    Patient has no known allergies.  Review of Systems   Review of Systems Ten systems are reviewed and are negative for acute change except as noted in the HPI  Physical Exam Updated Vital Signs BP (!) 136/59   Pulse 65   Temp 97.9 F (36.6 C) (Oral)   Resp 20   Ht '5\' 10"'$  (1.778 m)   Wt 70.3 kg   SpO2 98%   BMI 22.24 kg/m   Physical Exam Constitutional:      General: He is not in acute distress.    Appearance: Normal appearance. He is well-developed. He is not ill-appearing or diaphoretic.  HENT:     Head: Normocephalic and atraumatic.  Eyes:     General: Vision grossly intact. Gaze aligned appropriately.     Pupils: Pupils are equal, round, and reactive to light.  Neck:     Trachea: Trachea and phonation normal.  Cardiovascular:     Rate and Rhythm: Normal rate and regular rhythm.  Pulmonary:     Effort: Pulmonary effort is normal. No respiratory distress.  Abdominal:     General: There is no distension.     Palpations: Abdomen is soft.     Tenderness: There is no abdominal tenderness. There is no guarding or rebound.  Musculoskeletal:        General: Normal range of motion.     Cervical back: Normal range of motion.     Right lower leg: No tenderness. No edema.     Left lower leg: No tenderness. No edema.   Skin:    General: Skin is warm and dry.  Neurological:     Mental Status: He is alert.     GCS: GCS eye subscore is 4. GCS verbal subscore is 5. GCS motor subscore is 6.     Comments: Speech is clear and goal oriented, follows commands Major Cranial nerves without deficit, no facial droop Moves extremities without ataxia, coordination intact  Psychiatric:        Behavior: Behavior normal.    ED Results / Procedures / Treatments   Labs (all labs ordered are listed,  but only abnormal results are displayed) Labs Reviewed  RESP PANEL BY RT-PCR (FLU A&B, COVID) ARPGX2 - Abnormal; Notable for the following components:      Result Value   SARS Coronavirus 2 by RT PCR POSITIVE (*)    All other components within normal limits  BASIC METABOLIC PANEL - Abnormal; Notable for the following components:   Glucose, Bld 125 (*)    Creatinine, Ser 1.44 (*)    Calcium 8.6 (*)    GFR, Estimated 48 (*)    All other components within normal limits  CBC - Abnormal; Notable for the following components:   RBC 3.51 (*)    Hemoglobin 12.4 (*)    HCT 37.1 (*)    MCV 105.7 (*)    MCH 35.3 (*)    All other components within normal limits  TROPONIN I (HIGH SENSITIVITY) - Abnormal; Notable for the following components:   Troponin I (High Sensitivity) 28 (*)    All other components within normal limits  TROPONIN I (HIGH SENSITIVITY) - Abnormal; Notable for the following components:   Troponin I (High Sensitivity) 24 (*)    All other components within normal limits    EKG EKG Interpretation  Date/Time:  Friday July 22 2020 06:04:28 EDT Ventricular Rate:  53 PR Interval:  198 QRS Duration: 148 QT Interval:  484 QTC Calculation: 454 R Axis:   -43 Text Interpretation: Sinus bradycardia with sinus arrhythmia Left axis deviation Right bundle branch block Left ventricular hypertrophy with repolarization abnormality ( R in aVL ) Abnormal ECG Confirmed by Orpah Greek [phone]) on 07/22/2020  6:14:20 AM  Radiology DG Chest 2 View  Result Date: 07/22/2020 CLINICAL DATA:  87 year old male with chest pain. EXAM: CHEST - 2 VIEW COMPARISON:  Chest radiographs 09/06/2017. FINDINGS: AP and lateral views today. Chronic surgical clips and volume loss in the left hemithorax. Chronic pleural blunting or scarring at the costophrenic angle. Superimposed prior CABG. Mediastinal contours remain within normal limits. No pneumothorax, pulmonary edema, acute pleural effusion or pulmonary opacity. Overall lower lung volumes compared to 2019. Visualized tracheal air column is within normal limits. No acute osseous abnormality identified. Abdomen Calcified aortic atherosclerosis. Negative visible bowel gas pattern. IMPRESSION: 1. Lower lung volumes but otherwise stable chest since 2019 with evidence of prior partial left pneumonectomy. 2.  Aortic Atherosclerosis (ICD10-I70.0). Electronically Signed   By: Genevie Ann M.D.   On: 07/22/2020 06:43    Procedures Procedures   Medications Ordered in ED Medications  nitroGLYCERIN (NITROSTAT) SL tablet 0.4 mg (0.4 mg Sublingual Given 07/22/20 0728)  aspirin chewable tablet 324 mg (324 mg Oral Given 07/22/20 0725)    ED Course  I have reviewed the triage vital signs and the nursing notes.  Pertinent labs & imaging results that were available during my care of the patient were reviewed by me and considered in my medical decision making (see chart for details).  Clinical Course as of 07/22/20 1323  Fri Jul 22, 2020  [id number] Dr. Marisue Ivan [BM]  1129 Dr Lorin Mercy [BM]    Clinical Course User Index [BM] Gari Crown   MDM Rules/Calculators/A&P                         Additional history obtained from: Nursing notes from this visit. Review of electronic medical records.  Patient's most recent cardiology visit 11/19/2019 with Dr. Curly [place].  LVEF 25-30% September 1366. --- 87 year old male with several risk factors presented for chest  pain onset 9 PM last night,  followed by cardiology.  Pain is not reproducible on exam has been constant since onset and continues upon initial evaluation.  Case discussed with attending physician Dr. Sherry Ruffing, will provide patient with aspirin 325 mg and sublingual nitroglycerin.  Will obtain chest pain work-up labs including CBC, BMP, troponin level along with EKG and chest x-ray.  Plan to consult cardiology after initial labs.  On initial valuation patient is stable appearing no acute distress vital signs within normal limits. ----------------- I ordered, reviewed and interpreted labs which include: COVID test positive, patient denies new respiratory symptoms. High-sensitivity troponin elevated at 28, delta troponin flat at 24 BMP shows baseline creatinine 1.44, no AKI, electrolyte derangement or gap. CBC shows mild anemia of 12.4, no leukocytosis or thrombocytopenia.  CXR: IMPRESSION:  1. Lower lung volumes but otherwise stable chest since 2019 with  evidence of prior partial left pneumonectomy.  2.  Aortic Atherosclerosis (ICD10-I70.0).      EKG: Sinus bradycardia with sinus arrhythmia Left axis deviation Right bundle branch block Left ventricular hypertrophy with repolarization abnormality ( R in aVL ) Abnormal ECG Confirmed by Orpah Greek [phone]) on 07/22/2020 6:14:20 AM ----------- Patient was treated with aspirin 324 milligrams.  He was also given 1 nitro 0.4 mg sublingual.  Patient reports his chest pain improved following nitro.  History and work-up today is concerning for cardiac chest pain possible demand component given current COVID infection.  Cardiology, Dr. Marisue Ivan, was consulted.  Patient seen and evaluated by Dr. Sherry Ruffing, plan to admit to medicine service, patient and his son are agreeable to care plan. - On reevaluation patient was resting comfortably in bed no acute distress vital signs are stable on room air. -- Case discussed with Dr. Lorin Mercy, patient accepted for  admission. ---------- Addendum: Informed by Dr. Lorin Mercy that hospital team and cardiology feel patient is stable for discharge.  Patient reassessed resting comfortably in bed no acute distress states understanding of care plan, close follow-up with PCP and cardiology recommended.  Additionally medicine team feels that patient's COVID infection was a couple of weeks ago and not acute.  Dr. Sherry Ruffing updated on plan.  Note: Portions of this report may have been transcribed using voice recognition software. Every effort was made to ensure accuracy; however, inadvertent computerized transcription errors may still be present.  Final Clinical Impression(s) / ED Diagnoses Final diagnoses:  Chest pain, unspecified type  COVID-19    Rx / DC Orders ED Discharge Orders     None        Gari Crown 07/22/20 1149    [address] 07/22/20 1326    Tegeler, Gwenyth Allegra, MD 07/22/20 [phone]

## 2020-07-22 NOTE — ED Notes (Signed)
Patient transported to X-ray 

## 2020-07-23 ENCOUNTER — Inpatient Hospital Stay (HOSPITAL_COMMUNITY): Payer: Medicare HMO

## 2020-07-23 DIAGNOSIS — I5022 Chronic systolic (congestive) heart failure: Secondary | ICD-10-CM | POA: Diagnosis not present

## 2020-07-23 DIAGNOSIS — R0781 Pleurodynia: Secondary | ICD-10-CM | POA: Diagnosis not present

## 2020-07-23 DIAGNOSIS — J189 Pneumonia, unspecified organism: Secondary | ICD-10-CM | POA: Diagnosis not present

## 2020-07-23 DIAGNOSIS — N1831 Chronic kidney disease, stage 3a: Secondary | ICD-10-CM | POA: Diagnosis not present

## 2020-07-23 LAB — CBC WITH DIFFERENTIAL/PLATELET
Abs Immature Granulocytes: 0 10*3/uL (ref 0.00–0.07)
Basophils Absolute: 0 10*3/uL (ref 0.0–0.1)
Basophils Relative: 0 %
Eosinophils Absolute: 0 10*3/uL (ref 0.0–0.5)
Eosinophils Relative: 0 %
HCT: 33.7 % — ABNORMAL LOW (ref 39.0–52.0)
Hemoglobin: 11.2 g/dL — ABNORMAL LOW (ref 13.0–17.0)
Lymphocytes Relative: 2 %
Lymphs Abs: 0.4 10*3/uL — ABNORMAL LOW (ref 0.7–4.0)
MCH: 34.7 pg — ABNORMAL HIGH (ref 26.0–34.0)
MCHC: 33.2 g/dL (ref 30.0–36.0)
MCV: 104.3 fL — ABNORMAL HIGH (ref 80.0–100.0)
Monocytes Absolute: 0.8 10*3/uL (ref 0.1–1.0)
Monocytes Relative: 4 %
Neutro Abs: 18.2 10*3/uL — ABNORMAL HIGH (ref 1.7–7.7)
Neutrophils Relative %: 94 %
Platelets: 247 10*3/uL (ref 150–400)
RBC: 3.23 MIL/uL — ABNORMAL LOW (ref 4.22–5.81)
RDW: 12.7 % (ref 11.5–15.5)
WBC: 19.4 10*3/uL — ABNORMAL HIGH (ref 4.0–10.5)
nRBC: 0 % (ref 0.0–0.2)
nRBC: 0 /100 WBC

## 2020-07-23 LAB — HEPATITIS PANEL, ACUTE
HCV Ab: NONREACTIVE
Hep A IgM: NONREACTIVE
Hep B C IgM: NONREACTIVE
Hepatitis B Surface Ag: NONREACTIVE

## 2020-07-23 LAB — COMPREHENSIVE METABOLIC PANEL
ALT: 338 U/L — ABNORMAL HIGH (ref 0–44)
AST: 798 U/L — ABNORMAL HIGH (ref 15–41)
Albumin: 2.6 g/dL — ABNORMAL LOW (ref 3.5–5.0)
Alkaline Phosphatase: 224 U/L — ABNORMAL HIGH (ref 38–126)
Anion gap: 6 (ref 5–15)
BUN: 28 mg/dL — ABNORMAL HIGH (ref 8–23)
CO2: 20 mmol/L — ABNORMAL LOW (ref 22–32)
Calcium: 8.4 mg/dL — ABNORMAL LOW (ref 8.9–10.3)
Chloride: 110 mmol/L (ref 98–111)
Creatinine, Ser: 1.72 mg/dL — ABNORMAL HIGH (ref 0.61–1.24)
GFR, Estimated: 39 mL/min — ABNORMAL LOW (ref >60)
Glucose, Bld: 144 mg/dL — ABNORMAL HIGH (ref 70–99)
Potassium: 4 mmol/L (ref 3.5–5.1)
Sodium: 136 mmol/L (ref 135–145)
Total Bilirubin: 4.3 mg/dL — ABNORMAL HIGH (ref 0.3–1.2)
Total Protein: 5.7 g/dL — ABNORMAL LOW (ref 6.5–8.1)

## 2020-07-23 LAB — C-REACTIVE PROTEIN: CRP: 13.2 mg/dL — ABNORMAL HIGH (ref <1.0)

## 2020-07-23 LAB — MAGNESIUM: Magnesium: 1.6 mg/dL — ABNORMAL LOW (ref 1.7–2.4)

## 2020-07-23 LAB — BILIRUBIN, DIRECT: Bilirubin, Direct: 3.1 mg/dL — ABNORMAL HIGH (ref 0.0–0.2)

## 2020-07-23 LAB — VITAMIN B12: Vitamin B-12: 622 pg/mL (ref 180–914)

## 2020-07-23 LAB — HEMOGLOBIN A1C
Hgb A1c MFr Bld: 6.4 % — ABNORMAL HIGH (ref 4.8–5.6)
Mean Plasma Glucose: 136.98 mg/dL

## 2020-07-23 LAB — URINALYSIS, ROUTINE W REFLEX MICROSCOPIC
Bilirubin Urine: NEGATIVE
Glucose, UA: NEGATIVE mg/dL
Ketones, ur: NEGATIVE mg/dL
Leukocytes,Ua: NEGATIVE
Nitrite: NEGATIVE
Protein, ur: 100 mg/dL — AB
RBC / HPF: >50 RBC/hpf — ABNORMAL HIGH (ref 0–5)
Specific Gravity, Urine: 1.014 (ref 1.005–1.030)
pH: 5 (ref 5.0–8.0)

## 2020-07-23 LAB — FOLATE: Folate: 4.8 ng/mL — ABNORMAL LOW (ref >5.9)

## 2020-07-23 LAB — GLUCOSE, CAPILLARY
Glucose-Capillary: 151 mg/dL — ABNORMAL HIGH (ref 70–99)
Glucose-Capillary: 132 mg/dL — ABNORMAL HIGH (ref 70–99)
Glucose-Capillary: 145 mg/dL — ABNORMAL HIGH (ref 70–99)
Glucose-Capillary: 130 mg/dL — ABNORMAL HIGH (ref 70–99)

## 2020-07-23 LAB — FERRITIN: Ferritin: 1197 ng/mL — ABNORMAL HIGH (ref 24–336)

## 2020-07-23 LAB — ACETAMINOPHEN LEVEL: Acetaminophen (Tylenol), Serum: <10 ug/mL — ABNORMAL LOW (ref 10–30)

## 2020-07-23 LAB — AMMONIA: Ammonia: 29 umol/L (ref 9–35)

## 2020-07-23 LAB — PHOSPHORUS: Phosphorus: <1 mg/dL — CL (ref 2.5–4.6)

## 2020-07-23 LAB — STREP PNEUMONIAE URINARY ANTIGEN: Strep Pneumo Urinary Antigen: NEGATIVE

## 2020-07-23 LAB — D-DIMER, QUANTITATIVE: D-Dimer, Quant: 2.08 ug/mL-FEU — ABNORMAL HIGH (ref 0.00–0.50)

## 2020-07-23 MED ORDER — MAGNESIUM SULFATE 2 GM/50ML IV SOLN
2.0000 g | Freq: Once | INTRAVENOUS | Status: AC
  Administered 2020-07-23: 2 g via INTRAVENOUS
  Filled 2020-07-23: qty 50

## 2020-07-23 MED ORDER — LORAZEPAM 2 MG/ML IJ SOLN
INTRAMUSCULAR | Status: AC
  Administered 2020-07-23: 1 mg via INTRAVENOUS
  Filled 2020-07-23: qty 1

## 2020-07-23 MED ORDER — HALOPERIDOL LACTATE 5 MG/ML IJ SOLN
1.0000 mg | Freq: Once | INTRAMUSCULAR | Status: AC | PRN
  Administered 2020-07-24: 1 mg via INTRAVENOUS
  Filled 2020-07-23: qty 1

## 2020-07-23 MED ORDER — FOLIC ACID 1 MG PO TABS
1.0000 mg | ORAL_TABLET | Freq: Every day | ORAL | Status: DC
  Administered 2020-07-26 – 2020-08-08 (×14): 1 mg via ORAL
  Filled 2020-07-23 (×15): qty 1

## 2020-07-23 MED ORDER — SODIUM PHOSPHATES 45 MMOLE/15ML IV SOLN
30.0000 mmol | Freq: Once | INTRAVENOUS | Status: AC
  Administered 2020-07-23: 30 mmol via INTRAVENOUS
  Filled 2020-07-23: qty 10

## 2020-07-23 MED ORDER — LORAZEPAM 2 MG/ML IJ SOLN: 1.0000 mg | Freq: Once | INTRAMUSCULAR | Status: AC

## 2020-07-23 MED ORDER — ENOXAPARIN SODIUM 40 MG/0.4ML IJ SOSY
40.0000 mg | PREFILLED_SYRINGE | INTRAMUSCULAR | Status: DC
  Administered 2020-07-23: 40 mg via SUBCUTANEOUS
  Filled 2020-07-23: qty 0.4

## 2020-07-23 MED ORDER — K PHOS MONO-SOD PHOS DI & MONO 155-852-130 MG PO TABS
500.0000 mg | ORAL_TABLET | Freq: Once | ORAL | Status: DC
  Filled 2020-07-23: qty 2

## 2020-07-23 MED ORDER — LORAZEPAM 2 MG/ML IJ SOLN
0.5000 mg | INTRAMUSCULAR | Status: DC | PRN
  Administered 2020-07-23 – 2020-07-24 (×3): 0.5 mg via INTRAVENOUS
  Filled 2020-07-23 (×3): qty 1

## 2020-07-23 MED ORDER — SODIUM CHLORIDE 0.9 % IV SOLN: INTRAVENOUS | Status: AC

## 2020-07-23 MED ORDER — LORAZEPAM 2 MG/ML IJ SOLN
0.5000 mg | Freq: Once | INTRAMUSCULAR | Status: AC
  Administered 2020-07-23: 0.5 mg via INTRAVENOUS
  Filled 2020-07-23: qty 1

## 2020-07-23 MED ORDER — CHLORHEXIDINE GLUCONATE CLOTH 2 % EX PADS
6.0000 | MEDICATED_PAD | Freq: Every day | CUTANEOUS | Status: DC
  Administered 2020-07-24 – 2020-08-08 (×16): 6 via TOPICAL

## 2020-07-23 MED ORDER — LORAZEPAM 2 MG/ML IJ SOLN
1.0000 mg | Freq: Once | INTRAMUSCULAR | Status: AC
  Administered 2020-07-23: 1 mg via INTRAVENOUS
  Filled 2020-07-23: qty 1

## 2020-07-23 MED ORDER — METRONIDAZOLE 500 MG/100ML IV SOLN
500.0000 mg | Freq: Three times a day (TID) | INTRAVENOUS | Status: DC
  Administered 2020-07-23 – 2020-07-24 (×3): 500 mg via INTRAVENOUS
  Filled 2020-07-23 (×3): qty 100

## 2020-07-23 MED ORDER — SODIUM CHLORIDE 0.9 % IV SOLN: INTRAVENOUS | Status: DC

## 2020-07-23 MED ORDER — DOXYCYCLINE HYCLATE 100 MG PO TABS
100.0000 mg | ORAL_TABLET | Freq: Two times a day (BID) | ORAL | Status: DC
  Filled 2020-07-23: qty 1

## 2020-07-23 NOTE — Progress Notes (Signed)
PHARMACIST - PHYSICIAN COMMUNICATION DR:   Elgergawy CONCERNING: Antibiotic IV to Oral Route Change Policy  RECOMMENDATION: This patient is receiving doxycycline by the intravenous route.  Based on criteria approved by the Pharmacy and Therapeutics Committee, the antibiotic(s) is/are being converted to the equivalent oral dose form(s).   DESCRIPTION: These criteria include: Patient being treated for a respiratory tract infection, urinary tract infection, cellulitis or clostridium difficile associated diarrhea if on metronidazole The patient is not neutropenic and does not exhibit a GI malabsorption state The patient is eating (either orally or via tube) and/or has been taking other orally administered medications for a least 24 hours The patient is improving clinically and has a Tmax < 100.5  If you have questions about this conversion, please contact the Pharmacy Department  '[]'$   [phone] )  [facility] '[]'$   [phone] )  [facility] '[x]'$   [phone] )  [facility] '[]'$   [phone] )  [facility] '[]'$   [phone] )  [facility]

## 2020-07-23 NOTE — Progress Notes (Signed)
EkG attempted x1, pt combative and uncooperative at this time to obtain an accurate reading. MD aware.

## 2020-07-23 NOTE — Plan of Care (Signed)

## 2020-07-23 NOTE — Progress Notes (Addendum)
PROGRESS NOTE    Philip Kerr  [id number] DOB: 1933/02/20 DOA: 07/22/2020 PCP: Biagio Borg, MD    Chief Complaint  Patient presents with   Chest Pain    Brief Narrative:     HPI: Philip Kerr is a 87 y.o. male with medical history significant for coronary artery disease with CABG in 1996, ischemic cardiomyopathy, type 2 diabetes mellitus, hypertension, and dementia, now presenting to the emergency department with chest pain.  Patient reports a chronic cough that may have been worsening over the past 3 or 4 days but became concerned when he developed severe chest pain while at rest last night.  He was evaluated in the emergency department for the chest pain earlier today, had minimal and stable elevation in troponin, no significant change in EKG, was seen by cardiology, and his symptoms were suspected to be noncardiac.  After returning home, he continued to have worsening in his chest pain, nausea, cough, and return to the emergency department.  Patient and his wife had both been experiencing fever, nausea, and severe fatigue approximately 3 weeks ago and the patient felt that he had almost completely recovered from that before the onset of the current symptoms.   ED Course: Upon arrival to the ED, patient is found to be afebrile, saturating 90s on room air, and with stable blood pressure.  EKG with sinus rhythm and chronic LBBB.  Chest x-ray with increasing consolidation at the left base concerning for pneumonia.  Chemistry panel with glucose 214 and creatinine 1.26.  CBC notable for mild macrocytic anemia and new leukocytosis to 16,200.  Patient was treated with Rocephin and doxycycline in the ED.    Assessment & Plan:   Principal Problem:   Pneumonia Active Problems:   Diabetes mellitus type II, non insulin dependent (HCC)   Essential hypertension   Coronary artery disease   Chronic systolic CHF (congestive heart failure) (HCC)   Dementia with behavioral disturbance  (HCC)   Depression   Chronic kidney disease, stage 3a (HCC)   Chest pain   Lab test positive for detection of COVID-19 virus   Bacterial pneumonia;  - Presents with atypical chest pain and found to have positive COVID-19 pcr, new leukocytosis, and new consolidation on CXR concerning for PNA  - Presentation most consistent with recent COVID-19 infection and now secondary bacterial pneumonia  - Continue antibiotics with Rocephin and doxycycline(avoiding azithromycin given prolonged QTC).   COVID-19 positive  -Findings on chest x-ray suggest more bacterial pneumonia than COVID-19 of pneumonia, he has no hypoxia, so no indication for steroids, as well elevated LFTs, so cannot use remdesivir. -CT value of 32.6.  Transaminitis -We will check right upper quadrant ultrasound, will check lipid panel, avoid nephrotoxic medications, bilirubin is elevated at 3.1. - will check Tylenol level.  Severe hypophosphatemia -Repleted  Hypomagnesemia -Repleted  Macrocytosis -Low folic acid level, started on supplements  Chest pain; CAD  - Presents with chest pain at rest, had stable mild troponin elevation, and was seen by cardiology in the ED with low-suspicion for cardiac etiology - He has now developed a leukocytosis and increasing consolidation on CXR and his complaints are likely due to PNA  - Continue ASA, statin, beta-blocker    Chronic systolic CHF - EF was [id number] with severe LVH, diffuse hypokinesis, mild AI, moderate MR, and moderate LA enlargement in 2019  - Appears compensated  - Continue beta-blocker and ARB, monitor volume status    -No evidence of volume overload, continue with  judicious IV fluids  AKI on CKD IIIa  - SCr is 1.26 on admission; is 1.7 this morning, continue with gentle hydration and avoid I nephrotoxic medications - Renally-dose medications, monitor     Hypertension  -Worsening renal function we will hold losartan, continue with metoprolol   Type II DM  - Serum  glucose is 214 in ED; A1c was 6.5% in December 2021  - He is diet-controlled at home  - Monitor CBGs and use low-intensity SSI if needed    Dementia   -Used to be more confused, with delirium, has a Actuary,  Will check urinalysis, will check bladder scan as well as wife reports some polyuria and urinary retention   DVT prophylaxis: Lovenox Code Status: DNR Family Communication: D/W wife by phone Disposition:   Status is: Inpatient  Remains inpatient appropriate because:IV treatments appropriate due to intensity of illness or inability to take PO  Dispo: The patient is from: Home              Anticipated d/c is to: Home              Patient currently is not medically stable to d/c.   Difficult to place patient No       Consultants:  none     Subjective:  Patient is confused, he is unable to provide any complaints, he has a sitter at bedside.  Objective: Vitals:   07/23/20 0024 07/23/20 0340 07/23/20 0721 07/23/20 1118  BP: (!) 154/63 (!) 107/54 (!) 129/59   Pulse: 96 85 82   Resp: 20 20 (!) 21 (!) 21  Temp: (!) 101.2 F (38.4 C) (!) 100.6 F (38.1 C) 98.1 F (36.7 C) 98.6 F (37 C)  TempSrc: Axillary Axillary Oral Axillary  SpO2: 95% 94% 97%   Weight: 76.1 kg       Intake/Output Summary (Last 24 hours) at 07/23/2020 1339 Last data filed at 07/23/2020 0212 Gross per 24 hour  Intake --  Output 2650 ml  Net -2650 ml   Filed Weights   07/23/20 0024  Weight: 76.1 kg    Examination:  Awake Alert, Oriented X1, confused, impaired cognition and insight. Symmetrical Chest wall movement, Good air movement bilaterally, CTAB RRR,No Gallops,Rubs or new Murmurs, No Parasternal Heave +ve B.Sounds, Abd Soft, No tenderness, No rebound - guarding or rigidity. No Cyanosis, Clubbing or edema, No new Rash or bruise      Data Reviewed: I have personally reviewed following labs and imaging studies  CBC: Recent Labs  Lab 07/22/20 0614 07/22/20 1805 07/23/20 0455   WBC 10.1 16.2* 19.4*  NEUTROABS  --   --  18.2*  HGB 12.4* 12.7* 11.2*  HCT 37.1* 37.5* 33.7*  MCV 105.7* 103.6* 104.3*  PLT 297 250 [id number]    Basic Metabolic Panel: Recent Labs  Lab 07/22/20 0614 07/22/20 1805 07/23/20 0455  NA 136 135 136  K 4.1 4.5 4.0  CL 106 107 110  CO2 22 21* 20*  GLUCOSE 125* 214* 144*  BUN 23 23 28*  CREATININE 1.44* 1.26* 1.72*  CALCIUM 8.6* 8.7* 8.4*  MG  --   --  1.6*  PHOS  --   --  <1.0*    GFR: Estimated Creatinine Clearance: 33 mL/min (A) (by C-G formula based on SCr of 1.72 mg/dL (H)).  Liver Function Tests: Recent Labs  Lab 07/23/20 0455  AST 798*  ALT 338*  ALKPHOS 224*  BILITOT 4.3*  PROT 5.7*  ALBUMIN 2.6*  CBG: Recent Labs  Lab 07/22/20 2300 07/23/20 0824 07/23/20 1141  GLUCAP 200* 151* 132*     Recent Results (from the past 240 hour(s))  Resp Panel by RT-PCR (Flu A&B, Covid) Nasopharyngeal Swab     Status: Abnormal   Collection Time: 07/22/20  7:43 AM   Specimen: Nasopharyngeal Swab; Nasopharyngeal(NP) swabs in vial transport medium  Result Value Ref Range Status   SARS Coronavirus 2 by RT PCR POSITIVE (A) NEGATIVE Final    Comment: RESULT CALLED TO, READ BACK BY AND VERIFIED WITH: RN L MEEKS [id number] (NOTE) SARS-CoV-2 target nucleic acids are DETECTED.  The SARS-CoV-2 RNA is generally detectable in upper respiratory specimens during the acute phase of infection. Positive results are indicative of the presence of the identified virus, but do not rule out bacterial infection or co-infection with other pathogens not detected by the test. Clinical correlation with patient history and other diagnostic information is necessary to determine patient infection status. The expected result is Negative.  Fact Sheet for Patients: EntrepreneurPulse.com.au  Fact Sheet for Healthcare Providers: IncredibleEmployment.be  This test is not yet approved or cleared by the [place] FDA and  has been authorized for detection and/or diagnosis of SARS-CoV-2 by FDA under an Emergency Use Authorization (EUA).  This EUA will remain in effect (meaning this test can be used)  for the duration of  the COVID-19 declaration under Section 564(b)(1) of the Act, 21 U.S.C. section 360bbb-3(b)(1), unless the authorization is terminated or revoked sooner.     Influenza A by PCR NEGATIVE NEGATIVE Final   Influenza B by PCR NEGATIVE NEGATIVE Final    Comment: (NOTE) The Xpert Xpress SARS-CoV-2/FLU/RSV plus assay is intended as an aid in the diagnosis of influenza from Nasopharyngeal swab specimens and should not be used as a sole basis for treatment. Nasal washings and aspirates are unacceptable for Xpert Xpress SARS-CoV-2/FLU/RSV testing.  Fact Sheet for Patients: EntrepreneurPulse.com.au  Fact Sheet for Healthcare Providers: IncredibleEmployment.be  This test is not yet approved or cleared by the [place] FDA and has been authorized for detection and/or diagnosis of SARS-CoV-2 by FDA under an Emergency Use Authorization (EUA). This EUA will remain in effect (meaning this test can be used) for the duration of the COVID-19 declaration under Section 564(b)(1) of the Act, 21 U.S.C. section 360bbb-3(b)(1), unless the authorization is terminated or revoked.  Performed at [facility], [place] [address], [place], [place] [address]          Radiology Studies: DG Chest 2 View  Result Date: 07/22/2020 CLINICAL DATA:  87 year old male with chest pain. EXAM: CHEST - 2 VIEW COMPARISON:  Chest radiographs 09/06/2017. FINDINGS: AP and lateral views today. Chronic surgical clips and volume loss in the left hemithorax. Chronic pleural blunting or scarring at the costophrenic angle. Superimposed prior CABG. Mediastinal contours remain within normal limits. No pneumothorax, pulmonary edema, acute pleural effusion or pulmonary opacity.  Overall lower lung volumes compared to 2019. Visualized tracheal air column is within normal limits. No acute osseous abnormality identified. Abdomen Calcified aortic atherosclerosis. Negative visible bowel gas pattern. IMPRESSION: 1. Lower lung volumes but otherwise stable chest since 2019 with evidence of prior partial left pneumonectomy. 2.  Aortic Atherosclerosis (ICD10-I70.0). Electronically Signed   By: Genevie Ann M.D.   On: 07/22/2020 06:43   DG Chest Port 1 View  Result Date: 07/22/2020 CLINICAL DATA:  Left-sided chest pain, COVID-19 positive EXAM: PORTABLE CHEST 1 VIEW COMPARISON:  07/22/2020 at 6:20 a.m. FINDINGS:  Single frontal view of the chest demonstrates postsurgical changes from CABG. The cardiac silhouette is stable. Since the previous exam, there is increased consolidation at the left lung base. Chronic postsurgical changes within the left hemithorax, with stable left pleural thickening. Right chest is clear. No pneumothorax. IMPRESSION: 1. Increasing consolidation at the left lung base concerning for pneumonia. Electronically Signed   By: Randa Ngo M.D.   On: 07/22/2020 20:11        Scheduled Meds:  aspirin EC  81 mg Oral q morning   doxycycline  100 mg Oral Q12H   enoxaparin (LOVENOX) injection  40 mg Subcutaneous Q24H   insulin aspart  0-5 Units Subcutaneous QHS   insulin aspart  0-9 Units Subcutaneous TID WC   LORazepam  0.5 mg Intravenous Once   losartan  25 mg Oral BID   metoprolol tartrate  25 mg Oral BID   phosphorus  500 mg Oral Once   Continuous Infusions:  cefTRIAXone (ROCEPHIN)  IV     sodium phosphate  Dextrose 5% IVPB 30 mmol (07/23/20 0943)     LOS: 1 day      Phillips Climes, MD [facility]   To contact the attending provider between 7A-7P or the covering provider during after hours 7P-7A, please log into the web site [email or web address] and access using universal [place] password for that web site. If you do not have the password, please  call the hospital operator.  07/23/2020, 1:39 PM

## 2020-07-24 ENCOUNTER — Inpatient Hospital Stay (HOSPITAL_COMMUNITY): Payer: Medicare HMO

## 2020-07-24 DIAGNOSIS — N179 Acute kidney failure, unspecified: Secondary | ICD-10-CM

## 2020-07-24 DIAGNOSIS — R109 Unspecified abdominal pain: Secondary | ICD-10-CM | POA: Diagnosis not present

## 2020-07-24 DIAGNOSIS — R7401 Elevation of levels of liver transaminase levels: Secondary | ICD-10-CM

## 2020-07-24 DIAGNOSIS — J189 Pneumonia, unspecified organism: Secondary | ICD-10-CM | POA: Diagnosis not present

## 2020-07-24 LAB — CBC WITH DIFFERENTIAL/PLATELET
Abs Immature Granulocytes: 0.22 10*3/uL — ABNORMAL HIGH (ref 0.00–0.07)
Basophils Absolute: 0 10*3/uL (ref 0.0–0.1)
Basophils Relative: 0 %
Eosinophils Absolute: 0.1 10*3/uL (ref 0.0–0.5)
Eosinophils Relative: 0 %
HCT: 33.8 % — ABNORMAL LOW (ref 39.0–52.0)
Hemoglobin: 11.3 g/dL — ABNORMAL LOW (ref 13.0–17.0)
Immature Granulocytes: 2 %
Lymphocytes Relative: 10 %
Lymphs Abs: 1.4 10*3/uL (ref 0.7–4.0)
MCH: 34.8 pg — ABNORMAL HIGH (ref 26.0–34.0)
MCHC: 33.4 g/dL (ref 30.0–36.0)
MCV: 104 fL — ABNORMAL HIGH (ref 80.0–100.0)
Monocytes Absolute: 0.6 10*3/uL (ref 0.1–1.0)
Monocytes Relative: 5 %
Neutro Abs: 11.7 10*3/uL — ABNORMAL HIGH (ref 1.7–7.7)
Neutrophils Relative %: 83 %
Platelets: 193 10*3/uL (ref 150–400)
RBC: 3.25 MIL/uL — ABNORMAL LOW (ref 4.22–5.81)
RDW: 13.2 % (ref 11.5–15.5)
WBC: 14 10*3/uL — ABNORMAL HIGH (ref 4.0–10.5)
nRBC: 0 % (ref 0.0–0.2)

## 2020-07-24 LAB — GLUCOSE, CAPILLARY
Glucose-Capillary: 93 mg/dL (ref 70–99)
Glucose-Capillary: 96 mg/dL (ref 70–99)
Glucose-Capillary: 94 mg/dL (ref 70–99)
Glucose-Capillary: 81 mg/dL (ref 70–99)

## 2020-07-24 LAB — BASIC METABOLIC PANEL
Anion gap: 7 (ref 5–15)
BUN: 43 mg/dL — ABNORMAL HIGH (ref 8–23)
CO2: 20 mmol/L — ABNORMAL LOW (ref 22–32)
Calcium: 7.9 mg/dL — ABNORMAL LOW (ref 8.9–10.3)
Chloride: 111 mmol/L (ref 98–111)
Creatinine, Ser: 3.35 mg/dL — ABNORMAL HIGH (ref 0.61–1.24)
GFR, Estimated: 17 mL/min — ABNORMAL LOW (ref >60)
Glucose, Bld: 95 mg/dL (ref 70–99)
Potassium: 4.3 mmol/L (ref 3.5–5.1)
Sodium: 138 mmol/L (ref 135–145)

## 2020-07-24 LAB — CREATININE, URINE, RANDOM: Creatinine, Urine: 71.11 mg/dL

## 2020-07-24 LAB — HEPATIC FUNCTION PANEL
ALT: 171 U/L — ABNORMAL HIGH (ref 0–44)
AST: 159 U/L — ABNORMAL HIGH (ref 15–41)
Albumin: 2.3 g/dL — ABNORMAL LOW (ref 3.5–5.0)
Alkaline Phosphatase: 191 U/L — ABNORMAL HIGH (ref 38–126)
Bilirubin, Direct: 1.4 mg/dL — ABNORMAL HIGH (ref 0.0–0.2)
Indirect Bilirubin: 0.8 mg/dL (ref 0.3–0.9)
Total Bilirubin: 2.2 mg/dL — ABNORMAL HIGH (ref 0.3–1.2)
Total Protein: 5.4 g/dL — ABNORMAL LOW (ref 6.5–8.1)

## 2020-07-24 LAB — PHOSPHORUS: Phosphorus: 3.9 mg/dL (ref 2.5–4.6)

## 2020-07-24 LAB — SODIUM, URINE, RANDOM: Sodium, Ur: 71 mmol/L

## 2020-07-24 LAB — PROCALCITONIN: Procalcitonin: 20.87 ng/mL

## 2020-07-24 LAB — FERRITIN: Ferritin: 476 ng/mL — ABNORMAL HIGH (ref 24–336)

## 2020-07-24 LAB — C-REACTIVE PROTEIN: CRP: 18.6 mg/dL — ABNORMAL HIGH (ref <1.0)

## 2020-07-24 LAB — MAGNESIUM: Magnesium: 2.2 mg/dL (ref 1.7–2.4)

## 2020-07-24 MED ORDER — PIPERACILLIN-TAZOBACTAM IN DEX 2-0.25 GM/50ML IV SOLN
2.2500 g | Freq: Three times a day (TID) | INTRAVENOUS | Status: DC
  Administered 2020-07-24 – 2020-07-26 (×6): 2.25 g via INTRAVENOUS
  Filled 2020-07-24 (×7): qty 50

## 2020-07-24 MED ORDER — ALBUMIN HUMAN 25 % IV SOLN
25.0000 g | Freq: Four times a day (QID) | INTRAVENOUS | Status: AC
  Administered 2020-07-24 – 2020-07-25 (×4): 25 g via INTRAVENOUS
  Filled 2020-07-24 (×4): qty 100

## 2020-07-24 MED ORDER — RACEPINEPHRINE HCL 2.25 % IN NEBU
0.5000 mL | INHALATION_SOLUTION | Freq: Once | RESPIRATORY_TRACT | Status: AC
  Administered 2020-07-24: 0.5 mL via RESPIRATORY_TRACT
  Filled 2020-07-24 (×2): qty 0.5

## 2020-07-24 MED ORDER — ENOXAPARIN SODIUM 30 MG/0.3ML IJ SOSY
30.0000 mg | PREFILLED_SYRINGE | INTRAMUSCULAR | Status: DC
  Administered 2020-07-24: 30 mg via SUBCUTANEOUS
  Filled 2020-07-24: qty 0.3

## 2020-07-24 MED ORDER — SODIUM CHLORIDE 0.9 % IV BOLUS
500.0000 mL | Freq: Once | INTRAVENOUS | Status: AC
  Administered 2020-07-24: 500 mL via INTRAVENOUS

## 2020-07-24 MED ORDER — SODIUM CHLORIDE 0.9 % IV SOLN: INTRAVENOUS | Status: DC

## 2020-07-24 MED ORDER — PIPERACILLIN-TAZOBACTAM 3.375 G IVPB 30 MIN
3.3750 g | Freq: Once | INTRAVENOUS | Status: AC
  Administered 2020-07-24: 3.375 g via INTRAVENOUS
  Filled 2020-07-24: qty 50

## 2020-07-24 NOTE — Progress Notes (Signed)
Spoke with Dr. Waldron Labs to change the patient's Lovenox dose from '40mg'$  SQ daily to '30mg'$  SQ daily in the setting of decreased renal function (CrCl ~16.9).   [name], PharmD Pharmacy Resident 07/24/2020, 11:48 AM

## 2020-07-24 NOTE — Progress Notes (Signed)
This RN was called by patient's primary nurse to go and insert Coude cath. Upon arrival this RN discovered that patient already had Foley inserted with bright red blood in the bag. This RN asked the primary nurse about the Foley and the primary nurse said the MD wanted [place] to remove the Foley and insert the Coude. When this RN removed the Foley there was bright red blood coming from patient's penis. This RN informed the primary nurse that we are unable to insert the Coude because the patient is actively  bleeding from his penis and that the nurse should consult the Urologist for assistant.

## 2020-07-24 NOTE — Consult Note (Signed)
Renal Service Consult Note [facility]  Philip Kerr 07/24/2020 Sol Blazing, MD Requesting Physician: Dr. Waldron Labs  Reason for Consult: Renal failure HPI: The patient is a 87 y.o. year-old w/ hx of CAD sp CABG, DM, HL, HTN, ICM presented to ED w/ chest pain on 7/15. Him and his wife had been sick for a few weeks w/ n/v and severe fatigue but since then had been feeling better. +cough and SOB off and on. They were evaluated and he was COVID+ but did not require admit so was dc'd home. He came back to ED later than evening and was admitted for bacterial PNA, COVID +infection.  CXR showed new consolidation. IV abx were started. EF was known to be 25-30%, compensated w/o any CHF. Creat was 1.2 on admission, hx CKD 3a. Losartan and metoprolol were continued.  In the hospital evening 7/16 BP's dropped and the next day (today) creat bumped up to 3.3 and UOP dropped off to minimal. BP lowering meds were dc'd, IVF"s given and BP better today. Foley cath was placed yest, but it required replacement today by urology due to having the tip in the prostatic urethra. However neither the renal US or abd CT done today showed any signs of obstruction. UOP remains poor. Asked to see for renal failure.   Pt is a very poor historian, poor memory of recent events. Has chronic cough, no sig SOB, no LE edema.     ROS - denies CP, no joint pain, no HA, no blurry vision, no rash   Past Medical History  Past Medical History:  Diagnosis Date   Aortic insufficiency    Coronary artery disease    coronary artery bypass graft x 5 in 1996   Diabetes mellitus    Hypercholesterolemia    non-insulin dependent   Hypertension    Ischemic cardiomyopathy    Mitral regurgitation    PVC (premature ventricular contraction)    Past Surgical History  Past Surgical History:  Procedure Laterality Date   CORONARY ARTERY BYPASS GRAFT     LUNG SURGERY     OTHER SURGICAL HISTORY     percutaneous coronary  intervention of the  posterolateral segment on 01/27/1998   Family History  Family History  Problem Relation Age of Onset   CAD Neg Hx    Social History  reports that he has never smoked. He has never used smokeless tobacco. He reports that he does not drink alcohol and does not use drugs. Allergies No Known Allergies Home medications Prior to Admission medications   Medication Sig Start Date End Date Taking? Authorizing Provider  Ascorbic Acid (VITAMIN C PO) Take 1 capsule by mouth at bedtime.   Yes [provider]  aspirin EC 81 MG tablet Take 81 mg by mouth every morning. Swallow whole.   Yes [provider]  Cholecalciferol (VITAMIN D3 PO) Take 1 capsule by mouth at bedtime.   Yes [provider]  losartan (COZAAR) 50 MG tablet Take 1 tablet (50 mg total) by mouth daily. Patient taking differently: Take 25 mg by mouth 2 (two) times daily. 05/24/20  Yes Burnell Blanks, MD  metoprolol tartrate (LOPRESSOR) 50 MG tablet TAKE 0.5 TABLET (25 MG TOTAL) BY MOUTH 2 TIMES DAILY Patient taking differently: Take 25 mg by mouth 2 (two) times daily. 12/25/19  Yes Bhagat, Bhavinkumar, PA  rosuvastatin (CRESTOR) 20 MG tablet Take 1 tablet (20 mg total) by mouth daily. Patient taking differently: Take 20 mg by mouth  at bedtime. 12/25/19  Yes Bhagat, Bhavinkumar, PA  albuterol (PROVENTIL HFA;VENTOLIN HFA) 108 (90 Base) MCG/ACT inhaler Inhale 2 puffs into the lungs every 6 (six) hours as needed for wheezing or shortness of breath. Patient not taking: No sig reported 02/18/18   John, James W, MD  amLODipine (NORVASC) 5 MG tablet Take 1 tablet (5 mg total) by mouth daily. 12/17/19   McAlhany, Christopher D, MD  citalopram (CELEXA) 10 MG tablet Take 1 tablet (10 mg total) by mouth daily. Patient not taking: No sig reported 09/18/17 07/22/20  John, James W, MD  doxazosin (CARDURA) 2 MG tablet TAKE 1 TABLET (2 MG TOTAL) BY MOUTH AT BEDTIME. 11/17/19   John, James W, MD  furosemide  (LASIX) 20 MG tablet TAKE 1 TABLET (20 MG TOTAL) BY MOUTH DAILY AS NEEDED (FOR SWELLING). Patient taking differently: Take 20 mg by mouth daily as needed (swelling). 03/14/20   McAlhany, Christopher D, MD  pramipexole (MIRAPEX) 0.25 MG tablet Take 1 tablet (0.25 mg total) by mouth at bedtime. Patient not taking: No sig reported 08/27/17   John, James W, MD  QUEtiapine (SEROQUEL) 50 MG tablet Take 1 tablet (50 mg total) by mouth at bedtime. Patient not taking: No sig reported 12/22/19   John, James W, MD  tamsulosin (FLOMAX) 0.4 MG CAPS capsule TAKE 1 CAPSULE BY MOUTH EVERY DAY Patient not taking: No sig reported 01/29/18   John, James W, MD  traZODone (DESYREL) 50 MG tablet TAKE 1/2 TO 1 TABLET BY MOUTH AT BEDTIME AS NEEDED FOR SLEEP Patient not taking: No sig reported 08/25/18   John, James W, MD     Vitals:   07/24/20 1200 07/24/20 1702 07/24/20 2000 07/24/20 2013  BP: 121/66 (!) 128/97  123/71  Pulse: 88 88 78 93  Resp: (!) 21 (!) 22  16  Temp: 98.1 F (36.7 C) 98.4 F (36.9 C)  98.6 F (37 C)  TempSrc:  Oral  Oral  SpO2: 98% 99% 94% 99%  Weight:       Exam Gen alert, no distress, elderly frail male No rash, cyanosis or gangrene Sclera anicteric, throat clear slightly dry No jvd or bruits Chest diffuse scattered wheezing, occ rhonchi RRR no MRG Abd soft ntnd no mass or ascites +bs GU foley in place draining medium dark red urine in small amts MS no joint effusions or deformity Ext no LE or UE edema, no wounds or ulcers Neuro is alert, Ox 1.5, nonfocal, gen'd weakness    UA 7/16 - prot 100, >50 rbc, 21-50 wbc's, 0-5 epi, rare bacteria   UNa 71, UCr 71   Renal US 7/17 - 12 and 14 cm kidneys w/o hydro    CXR - 7/17 - LLL effusion and atx     2021 > creat 1.13- 1.45, eGFR 44-51 ml/min  Assessment/ Plan: AKI on CKD 3a - b/l creat 1.2- 1.4 from 2021, eGFR 44-51.  Creat 1.2 on admit here, bumped to 3.3 today w/ drop in UOP after BP's drop/ ARB exposure. There was a foley cath  issues, but no evidence of retention/ obstruction, and this has been taken care of today by urology. Renal US w/o obstruction. UA shows blood and wbc's. Pt was recovering from 1 week illness w/ GI losses, suspect some degree of vol depletion contributing to AKI.   Suspect ATN due to hypoperfusion from all above factors.  Has room for volume on exam, will ^IVF to 100cc/hr for now and bolus 500cc x 1.  Will   follow.  COVID +infection LLL PNA - on empiric IV abx, reason for admit along w/ COVID Hematuria - hx of BPH, suspect trauma from foley placement. Urology is following.  CAD hx of CABG      Rob   MD 07/24/2020, 9:30 PM  Recent Labs  Lab 07/23/20 0455 07/24/20 0547  WBC 19.4* 14.0*  HGB 11.2* 11.3*   Recent Labs  Lab 07/23/20 0455 07/24/20 0547  K 4.0 4.3  BUN 28* 43*  CREATININE 1.72* 3.35*  CALCIUM 8.4* 7.9*  PHOS <1.0* 3.9    

## 2020-07-24 NOTE — Procedures (Signed)
Procedure note  Pre procedure diagnosis:  1.  Traumatic Foley catheter with gross hematuria  Post procedure diagnosis: 1.  Same  Procedure(s): 1.  Cystourethroscopy with placement of difficult Foley catheter  Surgeon: Jacalyn Lefevre, MD  Assistants:  None  Anesthesia: Lidocaine gel  Complications:  None  EBL: Minimal  Specimens: 1.  None  Drains/Catheters: 1.  19 French council tip Foley catheter  Intraoperative findings:   Prostatic urethral trauma secondary to traumatic Foley Visualization of bladder was difficult secondary to hematuria  Indication:  Philip Kerr is a 87 y.o. male with COVID pna s/p traumautic foley catheter and gross blood per urethral meatus.     Description of procedure: Patient was unable to answer simple questions.  He was prepped and draped in usual sterile fashion.  A  half sheet was then placed.    A 17 French flexible cystoscope was placed in the urethra meatus advanced until false passage was seen in the prostatic urethra.  The true lumen was visualized and the cystoscope traversed into the bladder.  A 0.38 sensor wire was then placed through the cystoscope taking care to keep the wire in place as the cystoscope was removed.  An 25 [place] council tip Foley catheter was then placed over the wire and inflated with 10 cc sterile water.  The wire was removed.  The catheter was hooked up to a drainage bag.  Immediate drainage of moderately red-tinged urine drained.    Plan:  Continue foley x 2 weeks. Start tamsulosin.

## 2020-07-24 NOTE — Progress Notes (Signed)
PROGRESS NOTE    Philip Kerr  [id number] DOB: 04/14/1933 DOA: 07/22/2020 PCP: Biagio Borg, MD    Chief Complaint  Patient presents with   Chest Pain    Brief Narrative:    Philip Kerr is a 87 y.o. male with medical history significant for coronary artery disease with CABG in 1996, ischemic cardiomyopathy, type 2 diabetes mellitus, hypertension, and dementia,  presenting to the emergency department with chest pain.  Patient reports a chronic cough that may have been worsening over the past 3 or 4 days but became concerned when he developed severe chest pain while at rest last night , had minimal and stable elevation in troponin, no significant change in EKG, sent home after evaluation by cardiology after returning home, he continued to have worsening in his chest pain, nausea, cough, and return to the emergency department.  Patient and his wife had both been experiencing fever, nausea, and severe fatigue approximately 3 weeks ago and the patient felt that he had almost completely recovered from that before the onset of the current symptoms. -His work-up in ED was significant for transaminitis, pneumonia, as well he was COVID-19 positive, hospital course was complicated by severe delirium, and worsening renal failure.     Assessment & Plan:   Principal Problem:   Pneumonia Active Problems:   Diabetes mellitus type II, non insulin dependent (HCC)   Essential hypertension   Coronary artery disease   Chronic systolic CHF (congestive heart failure) (HCC)   Dementia with behavioral disturbance (HCC)   Depression   Chronic kidney disease, stage 3a (HCC)   Chest pain   Lab test positive for detection of COVID-19 virus   Bacterial pneumonia;  - Presents with atypical chest pain and found to have positive COVID-19 pcr, new leukocytosis, and new consolidation on CXR concerning for PNA  - Presentation most consistent with recent COVID-19 infection and now secondary bacterial  pneumonia  -Initially on Rocephin and doxycycline, but currently transitioned to Zosyn.  Acute renal failure  -Creatinine climbed up significantly to 3.35 today. -His FENa is 2.4% indicative of intrinsic renal failure -Lowest blood pressure was 90/48, received couple fluid boluses currently on IV fluid, will be very careful given his known EF 25%. -Renal ultrasound significant for bilateral renal cysts. -Avoid nephrotoxic medication, he did not receive any IV contrast. -Losartan has been discontinued  COVID-19 positive  -Findings on chest x-ray suggest more bacterial pneumonia than COVID-19 of pneumonia, he has no hypoxia, so no indication for steroids, as well elevated LFTs, so cannot use remdesivir. -CT value of 32.6.  Abd pain -Looks diffusely tender today, he is with worsening procalcitonin, I will change his antibiotic to IV Zosyn, will obtain stat CT abdomen/pelvis  Transaminitis -Negative hepatitis panel . -Tylenol level <10 -Ultrasound with no evidence of acute cholecystitis, common bile duct measures 8 mm, mild central intrahepatic bile duct dilation, likely chronic. -LFTs trending down. -Patient will not be able to tolerate MRCP given his encephalopathy. -We will check CT abdomen and pelvis without contrast for further evaluation  Severe hypophosphatemia -Phosphorus lipids severely low at<1, repleted, within normal limit today.  Hypomagnesemia -Repleted  Macrocytosis -Low folic acid level, started on supplements  Chest pain; CAD  - Presents with chest pain at rest, had stable mild troponin elevation, and was seen by cardiology in the ED with low-suspicion for cardiac etiology - He has now developed a leukocytosis and increasing consolidation on CXR and his complaints are likely due to PNA  -  Continue ASA, statin, beta-blocker    Chronic systolic CHF - EF was [id number] with severe LVH, diffuse hypokinesis, mild AI, moderate MR, and moderate LA enlargement in 2019  -  Appears compensated  - Continue beta-blocker and ARB, monitor volume status    -No evidence of volume overload, continue with judicious IV fluids    Hypertension  -Worsening renal function we will hold losartan, continue with metoprolol   Type II DM  - Serum glucose is 214 in ED; A1c was 6.5% in December 2021  - He is diet-controlled at home  - Monitor CBGs and use low-intensity SSI if needed    Dementia  /severe hospital delirium -Patient remains significantly confused, restless, able to use haloperidol given EGC, he is on as needed Ativan, discussed with son/wife at bedside, they will try to get bedside today to decrease risk of delirium when he is seeing familiar faces.   DVT prophylaxis: Lovenox Code Status: DNR Family Communication: D/W wife and son at bedside. Prognosis: Guarded Disposition:   Status is: Inpatient  Remains inpatient appropriate because:IV treatments appropriate due to intensity of illness or inability to take PO  Dispo: The patient is from: Home              Anticipated d/c is to: Home              Patient currently is not medically stable to d/c.   Difficult to place patient No       Consultants:  renal  Subjective:  Patient with restless and agitation overnight where he required as needed Ativan, Foley catheter inserted yesterday.  Objective: Vitals:   07/24/20 0800 07/24/20 1040 07/24/20 1117 07/24/20 1200  BP: (!) 103/59  (!) 114/59 121/66  Pulse: 82  93 88  Resp: 19  (!) 26 (!) 21  Temp: 98.7 F (37.1 C)  97.9 F (36.6 C) 98.1 F (36.7 C)  TempSrc: Oral  Axillary   SpO2: 96% 100% 99% 98%  Weight:        Intake/Output Summary (Last 24 hours) at 07/24/2020 1235 Last data filed at 07/24/2020 [id number] Gross per 24 hour  Intake --  Output 350 ml  Net -350 ml   Filed Weights   07/23/20 0024  Weight: 76.1 kg    Examination:   Is minimally responsive, but verbalize some words intermittently, but he is unable to follow any commands  or answer any questions, he is sick appearing.  . Symmetrical Chest wall movement, Good air movement bilaterally, some upper airway stridorous and congestion, mildly tachypneic RRR,No Gallops,Rubs or new Murmurs, No Parasternal Heave He has some diffuse abdominal tenderness, but no rebound, no guarding  no Cyanosis, Clubbing or edema, No new Rash or bruise     Data Reviewed: I have personally reviewed following labs and imaging studies  CBC: Recent Labs  Lab 07/22/20 0614 07/22/20 1805 07/23/20 0455 07/24/20 0547  WBC 10.1 16.2* 19.4* 14.0*  NEUTROABS  --   --  18.2* 11.7*  HGB 12.4* 12.7* 11.2* 11.3*  HCT 37.1* 37.5* 33.7* 33.8*  MCV 105.7* 103.6* 104.3* 104.0*  PLT 297 250 247 0000000    Basic Metabolic Panel: Recent Labs  Lab 07/22/20 0614 07/22/20 1805 07/23/20 0455 07/24/20 0547  NA 136 135 136 138  K 4.1 4.5 4.0 4.3  CL 106 107 110 111  CO2 22 21* 20* 20*  GLUCOSE 125* 214* 144* 95  BUN 23 23 28* 43*  CREATININE 1.44* 1.26* 1.72* 3.35*  CALCIUM 8.6*  8.7* 8.4* 7.9*  MG  --   --  1.6* 2.2  PHOS  --   --  <1.0* 3.9    GFR: Estimated Creatinine Clearance: 16.9 mL/min (A) (by C-G formula based on SCr of 3.35 mg/dL (H)).  Liver Function Tests: Recent Labs  Lab 07/23/20 0455 07/24/20 0547  AST 798* 159*  ALT 338* 171*  ALKPHOS 224* 191*  BILITOT 4.3* 2.2*  PROT 5.7* 5.4*  ALBUMIN 2.6* 2.3*    CBG: Recent Labs  Lab 07/23/20 1141 07/23/20 1709 07/23/20 2057 07/24/20 0829 07/24/20 1114  GLUCAP 132* 145* 130* 93 96     Recent Results (from the past 240 hour(s))  Resp Panel by RT-PCR (Flu A&B, Covid) Nasopharyngeal Swab     Status: Abnormal   Collection Time: 07/22/20  7:43 AM   Specimen: Nasopharyngeal Swab; Nasopharyngeal(NP) swabs in vial transport medium  Result Value Ref Range Status   SARS Coronavirus 2 by RT PCR POSITIVE (A) NEGATIVE Final    Comment: RESULT CALLED TO, READ BACK BY AND VERIFIED WITH: RN L MEEKS [id number]  MLM (NOTE) SARS-CoV-2 target nucleic acids are DETECTED.  The SARS-CoV-2 RNA is generally detectable in upper respiratory specimens during the acute phase of infection. Positive results are indicative of the presence of the identified virus, but do not rule out bacterial infection or co-infection with other pathogens not detected by the test. Clinical correlation with patient history and other diagnostic information is necessary to determine patient infection status. The expected result is Negative.  Fact Sheet for Patients: EntrepreneurPulse.com.au  Fact Sheet for Healthcare Providers: IncredibleEmployment.be  This test is not yet approved or cleared by the [place] FDA and  has been authorized for detection and/or diagnosis of SARS-CoV-2 by FDA under an Emergency Use Authorization (EUA).  This EUA will remain in effect (meaning this test can be used)  for the duration of  the COVID-19 declaration under Section 564(b)(1) of the Act, 21 U.S.C. section 360bbb-3(b)(1), unless the authorization is terminated or revoked sooner.     Influenza A by PCR NEGATIVE NEGATIVE Final   Influenza B by PCR NEGATIVE NEGATIVE Final    Comment: (NOTE) The Xpert Xpress SARS-CoV-2/FLU/RSV plus assay is intended as an aid in the diagnosis of influenza from Nasopharyngeal swab specimens and should not be used as a sole basis for treatment. Nasal washings and aspirates are unacceptable for Xpert Xpress SARS-CoV-2/FLU/RSV testing.  Fact Sheet for Patients: EntrepreneurPulse.com.au  Fact Sheet for Healthcare Providers: IncredibleEmployment.be  This test is not yet approved or cleared by the [place] FDA and has been authorized for detection and/or diagnosis of SARS-CoV-2 by FDA under an Emergency Use Authorization (EUA). This EUA will remain in effect (meaning this test can be used) for the duration of the COVID-19  declaration under Section 564(b)(1) of the Act, 21 U.S.C. section 360bbb-3(b)(1), unless the authorization is terminated or revoked.  Performed at [facility], [place] [address] [place], [place] [address]          Radiology Studies: DG Chest Port 1 View  Result Date: 07/24/2020 CLINICAL DATA:  Severe shortness of breath.  COVID positive EXAM: PORTABLE CHEST 1 VIEW COMPARISON:  Radiograph 07/22/2020 FINDINGS: Sternal wires over enlarged cardiac silhouette. There is LEFT lower lobe atelectasis and small effusion. RIGHT lung clear. IMPRESSION: 1. No interval change. 2. LEFT lower lobe atelectasis and effusion. Electronically Signed   By: Suzy Bouchard M.D.   On: 07/24/2020 11:45   DG Chest Port 1  View  Result Date: 07/22/2020 CLINICAL DATA:  Left-sided chest pain, COVID-19 positive EXAM: PORTABLE CHEST 1 VIEW COMPARISON:  07/22/2020 at 6:20 a.m. FINDINGS: Single frontal view of the chest demonstrates postsurgical changes from CABG. The cardiac silhouette is stable. Since the previous exam, there is increased consolidation at the left lung base. Chronic postsurgical changes within the left hemithorax, with stable left pleural thickening. Right chest is clear. No pneumothorax. IMPRESSION: 1. Increasing consolidation at the left lung base concerning for pneumonia. Electronically Signed   By: Randa Ngo M.D.   On: 07/22/2020 20:11   US Abdomen Limited RUQ (LIVER/GB)  Result Date: 07/23/2020 CLINICAL DATA:  Transaminitis. EXAM: ULTRASOUND ABDOMEN LIMITED RIGHT UPPER QUADRANT COMPARISON:  None. FINDINGS: Gallbladder: No gallstones or wall thickening visualized. No sonographic Murphy sign noted by sonographer. Common bile duct: Diameter: 8 mm with distal tapering.  No evidence of a duct stone. Liver: Coarsened echotexture. Normal overall liver size. No mass or focal lesion. Mild central intrahepatic bile duct dilation. Portal vein is patent on color Doppler imaging with normal direction of  blood flow towards the liver. Other: None. IMPRESSION: 1. No acute findings. 2. Common bile duct measures 8 mm and there is mild central intrahepatic bile duct dilation. This is likely chronic. However, if there are symptoms biliary obstruction, follow-up MRCP or ERCP would be recommended. 3. Coarsened echotexture of the liver, nonspecific. Normal liver size. No masses. Electronically Signed   By: Lajean Manes M.D.   On: 07/23/2020 18:44        Scheduled Meds:  aspirin EC  81 mg Oral q morning   Chlorhexidine Gluconate Cloth  6 each Topical Daily   enoxaparin (LOVENOX) injection  30 mg Subcutaneous [id number]   folic acid  1 mg Oral Daily   insulin aspart  0-5 Units Subcutaneous QHS   insulin aspart  0-9 Units Subcutaneous TID WC   Continuous Infusions:  sodium chloride 100 mL/hr at 07/24/20 0833   piperacillin-tazobactam (ZOSYN)  IV       LOS: 2 days      Phillips Climes, MD [facility]   To contact the attending provider between 7A-7P or the covering provider during after hours 7P-7A, please log into the web site [email or web address] and access using universal [place] password for that web site. If you do not have the password, please call the hospital operator.  07/24/2020, 12:35 PM

## 2020-07-24 NOTE — Consult Note (Signed)
I have been asked to see the patient by Dr. Phillips Climes for evaluation and management of traumatic Foley.  History of present illness: 87 year old man admitted for COVID-pneumonia found to have bloody urine output.  CT scan showed that the Foley balloon was inflated and patient's prostate.  When the balloon was deflated and catheter removed, the patient began to bleed from his penis.  Urology was then consulted for Foley catheter placement.   Review of systems: A 12 point comprehensive review of systems was obtained and is negative unless otherwise stated in the history of present illness.  Patient Active Problem List   Diagnosis Date Noted   Chest pain 07/22/2020   Lab test positive for detection of COVID-19 virus 07/22/2020   Pneumonia 07/22/2020   Chronic kidney disease, stage 3a ([place]) 12/24/2019   Scalp laceration, subsequent encounter 05/30/2019   Slow urinary stream 09/18/2017   Dizzy 09/18/2017   Dementia with behavioral disturbance ([place]) 09/18/2017   Low testosterone in male 09/18/2017   Depression 09/18/2017   Anemia 09/18/2017   Vitamin D deficiency 09/18/2017   Weight loss 09/18/2017   Fatigue 09/06/2017   RLS (restless legs syndrome) 08/27/2017   Rash 08/27/2017   Dysuria 08/23/2017   Insomnia 08/23/2017   Preventative health care 0000000   Chronic systolic CHF (congestive heart failure) ([place]) 06/13/2012   Murmur 04/21/2012   Coronary artery disease 04/05/2011   Diabetes mellitus type II, non insulin dependent ([place]) 01/21/2009   Essential hypertension 01/21/2009   HYPERCHOLESTEROLEMIA 01/20/2009   CAD, ARTERY BYPASS GRAFT 01/20/2009    No current facility-administered medications on file prior to encounter.   Current Outpatient Medications on File Prior to Encounter  Medication Sig Dispense Refill   Ascorbic Acid (VITAMIN C PO) Take 1 capsule by mouth at bedtime.     aspirin EC 81 MG tablet Take 81 mg by mouth every morning. Swallow whole.      Cholecalciferol (VITAMIN D3 PO) Take 1 capsule by mouth at bedtime.     losartan (COZAAR) 50 MG tablet Take 1 tablet (50 mg total) by mouth daily. (Patient taking differently: Take 25 mg by mouth 2 (two) times daily.) 90 tablet 3   metoprolol tartrate (LOPRESSOR) 50 MG tablet TAKE 0.5 TABLET (25 MG TOTAL) BY MOUTH 2 TIMES DAILY (Patient taking differently: Take 25 mg by mouth 2 (two) times daily.) 90 tablet 3   rosuvastatin (CRESTOR) 20 MG tablet Take 1 tablet (20 mg total) by mouth daily. (Patient taking differently: Take 20 mg by mouth at bedtime.) 90 tablet 3   albuterol (PROVENTIL HFA;VENTOLIN HFA) 108 (90 Base) MCG/ACT inhaler Inhale 2 puffs into the lungs every 6 (six) hours as needed for wheezing or shortness of breath. (Patient not taking: No sig reported) 1 Inhaler 11   amLODipine (NORVASC) 5 MG tablet Take 1 tablet (5 mg total) by mouth daily. 90 tablet 3   citalopram (CELEXA) 10 MG tablet Take 1 tablet (10 mg total) by mouth daily. (Patient not taking: No sig reported) 90 tablet 3   doxazosin (CARDURA) 2 MG tablet TAKE 1 TABLET (2 MG TOTAL) BY MOUTH AT BEDTIME. 90 tablet 2   furosemide (LASIX) 20 MG tablet TAKE 1 TABLET (20 MG TOTAL) BY MOUTH DAILY AS NEEDED (FOR SWELLING). (Patient taking differently: Take 20 mg by mouth daily as needed (swelling).) 90 tablet 2   pramipexole (MIRAPEX) 0.25 MG tablet Take 1 tablet (0.25 mg total) by mouth at bedtime. (Patient not taking: No sig reported) 90 tablet 3  QUEtiapine (SEROQUEL) 50 MG tablet Take 1 tablet (50 mg total) by mouth at bedtime. (Patient not taking: No sig reported) 90 tablet 3   tamsulosin (FLOMAX) 0.4 MG CAPS capsule TAKE 1 CAPSULE BY MOUTH EVERY DAY (Patient not taking: No sig reported) 90 capsule 1   traZODone (DESYREL) 50 MG tablet TAKE 1/2 TO 1 TABLET BY MOUTH AT BEDTIME AS NEEDED FOR SLEEP (Patient not taking: No sig reported) 90 tablet 1    Past Medical History:  Diagnosis Date   Aortic insufficiency    Coronary artery  disease    coronary artery bypass graft x 5 in 1996   Diabetes mellitus    Hypercholesterolemia    non-insulin dependent   Hypertension    Ischemic cardiomyopathy    Mitral regurgitation    PVC (premature ventricular contraction)     Past Surgical History:  Procedure Laterality Date   CORONARY ARTERY BYPASS GRAFT     LUNG SURGERY     OTHER SURGICAL HISTORY     percutaneous coronary intervention of the  posterolateral segment on 01/27/1998    Social History   Tobacco Use   Smoking status: Never   Smokeless tobacco: Never  Vaping Use   Vaping Use: Never used  Substance Use Topics   Alcohol use: No   Drug use: No    Family History  Problem Relation Age of Onset   CAD Neg Hx     PE: Vitals:   07/24/20 1040 07/24/20 1117 07/24/20 1200 07/24/20 1702  BP:  (!) 114/59 121/66 (!) 128/97  Pulse:  93 88 88  Resp:  (!) 26 (!) 21 (!) 22  Temp:  97.9 F (36.6 C) 98.1 F (36.7 C) 98.4 F (36.9 C)  TempSrc:  Axillary  Oral  SpO2: 100% 99% 98% 99%  Weight:       Patient is not oriented, responds intermittently to verbal stimuli  Atraumatic normocephalic head Audible rhonchi Abdomen is soft GU: circ phallus, blood at meatus Lower extremities are symmetric without appreciable edema Grossly neurologically intact No identifiable skin lesions  Recent Labs    07/22/20 1805 07/23/20 0455 07/24/20 0547  WBC 16.2* 19.4* 14.0*  HGB 12.7* 11.2* 11.3*  HCT 37.5* 33.7* 33.8*   Recent Labs    07/22/20 1805 07/23/20 0455 07/24/20 0547  NA 135 136 138  K 4.5 4.0 4.3  CL 107 110 111  CO2 21* 20* 20*  GLUCOSE 214* 144* 95  BUN 23 28* 43*  CREATININE 1.26* 1.72* 3.35*  CALCIUM 8.7* 8.4* 7.9*   No results for input(s): LABPT, INR in the last 72 hours. No results for input(s): LABURIN in the last 72 hours. Results for orders placed or performed during the hospital encounter of 07/22/20  Resp Panel by RT-PCR (Flu A&B, Covid) Nasopharyngeal Swab     Status: Abnormal    Collection Time: 07/22/20  7:43 AM   Specimen: Nasopharyngeal Swab; Nasopharyngeal(NP) swabs in vial transport medium  Result Value Ref Range Status   SARS Coronavirus 2 by RT PCR POSITIVE (A) NEGATIVE Final    Comment: RESULT CALLED TO, READ BACK BY AND VERIFIED WITH: RN L MEEKS [id number] MLM (NOTE) SARS-CoV-2 target nucleic acids are DETECTED.  The SARS-CoV-2 RNA is generally detectable in upper respiratory specimens during the acute phase of infection. Positive results are indicative of the presence of the identified virus, but do not rule out bacterial infection or co-infection with other pathogens not detected by the test. Clinical correlation with patient history  and other diagnostic information is necessary to determine patient infection status. The expected result is Negative.  Fact Sheet for Patients: EntrepreneurPulse.com.au  Fact Sheet for Healthcare Providers: IncredibleEmployment.be  This test is not yet approved or cleared by the [place] FDA and  has been authorized for detection and/or diagnosis of SARS-CoV-2 by FDA under an Emergency Use Authorization (EUA).  This EUA will remain in effect (meaning this test can be used)  for the duration of  the COVID-19 declaration under Section 564(b)(1) of the Act, 21 U.S.C. section 360bbb-3(b)(1), unless the authorization is terminated or revoked sooner.     Influenza A by PCR NEGATIVE NEGATIVE Final   Influenza B by PCR NEGATIVE NEGATIVE Final    Comment: (NOTE) The Xpert Xpress SARS-CoV-2/FLU/RSV plus assay is intended as an aid in the diagnosis of influenza from Nasopharyngeal swab specimens and should not be used as a sole basis for treatment. Nasal washings and aspirates are unacceptable for Xpert Xpress SARS-CoV-2/FLU/RSV testing.  Fact Sheet for Patients: EntrepreneurPulse.com.au  Fact Sheet for Healthcare  Providers: IncredibleEmployment.be  This test is not yet approved or cleared by the [place] FDA and has been authorized for detection and/or diagnosis of SARS-CoV-2 by FDA under an Emergency Use Authorization (EUA). This EUA will remain in effect (meaning this test can be used) for the duration of the COVID-19 declaration under Section 564(b)(1) of the Act, 21 U.S.C. section 360bbb-3(b)(1), unless the authorization is terminated or revoked.  Performed at [facility], [place] [address], [place], [place] [address]     Imaging: CT Abd/Pelvis 07/24/20 IMPRESSION: 1. Colonic involvement in left inguinal hernia, without obstruction or strangulation. 2. Foley catheter balloon malpositioned centrally within the prostate gland. 3. Innumerable bladder calculi. 4. Colonic diverticulosis 5. Coarse left lower lobe airspace opacities of uncertain chronicity. Recommend comparison to any previous available outside studies. 6.  Aortic Atherosclerosis (ICD10-170.0).     Electronically Signed   By: Lucrezia Europe M.D.   On: 07/24/2020 14:15    Imp/Recommendations: 87 year old man admitted to the hospital for COVID-pneumonia s/p traumatic Foley catheter placement.  -18 [place] tip Foley was placed over wire via bedside cystoscopy -Procedure note -Continue Foley catheter 2 weeks -Start tamsulosin 0.4 mg nightly -Void trial in 2 weeks either as outpatient at [facility] urology or inpatient if still hospitalized   Feliz Herard D Stephania Macfarlane

## 2020-07-24 NOTE — Progress Notes (Signed)
Pharmacy Antibiotic Note  Philip Kerr is a 87 y.o. male admitted on 07/22/2020 with sepsis.  Pharmacy has been consulted for Zosyn dosing. Patient has AKI with Scr of 3.35. Patient is hypotensive, afebrile, with WBC 14.0. Symptoms not improving after starting ceftriaxone, metronidazole, and doxycycline upon admission. Initiating broad-spectrum coverage with Zosyn.   Plan: Start Zosyn 3.375g IV once, then do Zosyn 2.25g IV q8h.  Stop Rocephin, Flagyl, and doxycycline.   Weight: 76.1 kg (167 lb 12.3 oz)  Temp (24hrs), Avg:98.5 F (36.9 C), Min:98.3 F (36.8 C), Max:98.7 F (37.1 C)  Recent Labs  Lab 07/22/20 0614 07/22/20 1805 07/23/20 0455 07/24/20 0547  WBC 10.1 16.2* 19.4* 14.0*  CREATININE 1.44* 1.26* 1.72* 3.35*    Estimated Creatinine Clearance: 16.9 mL/min (A) (by C-G formula based on SCr of 3.35 mg/dL (H)).    No Known Allergies  Antimicrobials this admission: Zosyn 7/17>> Rocephin 7/15>>7/16 Doxycycline 7/15>>7/17 Metronidazole 7/16>>7/17  Dose adjustments this admission:   Microbiology results: Covid +  Thank you for allowing pharmacy to be a part of this patient's care.  Philip Kerr 07/24/2020 10:31 AM

## 2020-07-25 DIAGNOSIS — N179 Acute kidney failure, unspecified: Secondary | ICD-10-CM | POA: Diagnosis not present

## 2020-07-25 DIAGNOSIS — N1831 Chronic kidney disease, stage 3a: Secondary | ICD-10-CM | POA: Diagnosis not present

## 2020-07-25 DIAGNOSIS — J189 Pneumonia, unspecified organism: Secondary | ICD-10-CM | POA: Diagnosis not present

## 2020-07-25 DIAGNOSIS — I5022 Chronic systolic (congestive) heart failure: Secondary | ICD-10-CM | POA: Diagnosis not present

## 2020-07-25 LAB — HEPATIC FUNCTION PANEL
ALT: 110 U/L — ABNORMAL HIGH (ref 0–44)
AST: 75 U/L — ABNORMAL HIGH (ref 15–41)
Albumin: 2.9 g/dL — ABNORMAL LOW (ref 3.5–5.0)
Alkaline Phosphatase: 174 U/L — ABNORMAL HIGH (ref 38–126)
Bilirubin, Direct: 0.6 mg/dL — ABNORMAL HIGH (ref 0.0–0.2)
Indirect Bilirubin: 0.7 mg/dL (ref 0.3–0.9)
Total Bilirubin: 1.3 mg/dL — ABNORMAL HIGH (ref 0.3–1.2)
Total Protein: 5.9 g/dL — ABNORMAL LOW (ref 6.5–8.1)

## 2020-07-25 LAB — GLUCOSE, CAPILLARY
Glucose-Capillary: 65 mg/dL — ABNORMAL LOW (ref 70–99)
Glucose-Capillary: 131 mg/dL — ABNORMAL HIGH (ref 70–99)
Glucose-Capillary: 102 mg/dL — ABNORMAL HIGH (ref 70–99)
Glucose-Capillary: 127 mg/dL — ABNORMAL HIGH (ref 70–99)
Glucose-Capillary: 188 mg/dL — ABNORMAL HIGH (ref 70–99)

## 2020-07-25 LAB — CBC WITH DIFFERENTIAL/PLATELET
Abs Immature Granulocytes: 0.1 10*3/uL — ABNORMAL HIGH (ref 0.00–0.07)
Basophils Absolute: 0 10*3/uL (ref 0.0–0.1)
Basophils Relative: 0 %
Eosinophils Absolute: 0.1 10*3/uL (ref 0.0–0.5)
Eosinophils Relative: 1 %
HCT: 31.7 % — ABNORMAL LOW (ref 39.0–52.0)
Hemoglobin: 10.4 g/dL — ABNORMAL LOW (ref 13.0–17.0)
Immature Granulocytes: 1 %
Lymphocytes Relative: 7 %
Lymphs Abs: 0.8 10*3/uL (ref 0.7–4.0)
MCH: 34.7 pg — ABNORMAL HIGH (ref 26.0–34.0)
MCHC: 32.8 g/dL (ref 30.0–36.0)
MCV: 105.7 fL — ABNORMAL HIGH (ref 80.0–100.0)
Monocytes Absolute: 0.5 10*3/uL (ref 0.1–1.0)
Monocytes Relative: 5 %
Neutro Abs: 9.8 10*3/uL — ABNORMAL HIGH (ref 1.7–7.7)
Neutrophils Relative %: 86 %
Platelets: 170 10*3/uL (ref 150–400)
RBC: 3 MIL/uL — ABNORMAL LOW (ref 4.22–5.81)
RDW: 13.5 % (ref 11.5–15.5)
WBC: 11.5 10*3/uL — ABNORMAL HIGH (ref 4.0–10.5)
nRBC: 0 % (ref 0.0–0.2)

## 2020-07-25 LAB — LEGIONELLA PNEUMOPHILA SEROGP 1 UR AG: L. pneumophila Serogp 1 Ur Ag: NEGATIVE

## 2020-07-25 LAB — BASIC METABOLIC PANEL
Anion gap: 12 (ref 5–15)
BUN: 51 mg/dL — ABNORMAL HIGH (ref 8–23)
CO2: 15 mmol/L — ABNORMAL LOW (ref 22–32)
Calcium: 7.8 mg/dL — ABNORMAL LOW (ref 8.9–10.3)
Chloride: 113 mmol/L — ABNORMAL HIGH (ref 98–111)
Creatinine, Ser: 4.47 mg/dL — ABNORMAL HIGH (ref 0.61–1.24)
GFR, Estimated: 12 mL/min — ABNORMAL LOW (ref >60)
Glucose, Bld: 76 mg/dL (ref 70–99)
Potassium: 4.5 mmol/L (ref 3.5–5.1)
Sodium: 140 mmol/L (ref 135–145)

## 2020-07-25 LAB — FERRITIN: Ferritin: 305 ng/mL (ref 24–336)

## 2020-07-25 LAB — PROCALCITONIN: Procalcitonin: 16.72 ng/mL

## 2020-07-25 LAB — PHOSPHORUS: Phosphorus: 4.3 mg/dL (ref 2.5–4.6)

## 2020-07-25 LAB — C-REACTIVE PROTEIN: CRP: 14.3 mg/dL — ABNORMAL HIGH (ref <1.0)

## 2020-07-25 MED ORDER — TAMSULOSIN HCL 0.4 MG PO CAPS
0.4000 mg | ORAL_CAPSULE | Freq: Every day | ORAL | Status: DC
  Administered 2020-07-25 – 2020-08-07 (×14): 0.4 mg via ORAL
  Filled 2020-07-25 (×14): qty 1

## 2020-07-25 MED ORDER — LORAZEPAM 2 MG/ML IJ SOLN
0.5000 mg | Freq: Once | INTRAMUSCULAR | Status: AC
  Administered 2020-07-25: 0.5 mg via INTRAVENOUS
  Filled 2020-07-25: qty 1

## 2020-07-25 MED ORDER — SODIUM BICARBONATE 8.4 % IV SOLN
INTRAVENOUS | Status: DC
  Filled 2020-07-25 (×6): qty 1000

## 2020-07-25 MED ORDER — HEPARIN SODIUM (PORCINE) 5000 UNIT/ML IJ SOLN
5000.0000 [IU] | Freq: Three times a day (TID) | INTRAMUSCULAR | Status: DC
  Administered 2020-07-26 – 2020-07-31 (×16): 5000 [IU] via SUBCUTANEOUS
  Filled 2020-07-25 (×17): qty 1

## 2020-07-25 MED ORDER — DEXTROSE 50 % IV SOLN
INTRAVENOUS | Status: AC
  Administered 2020-07-25: 25 mL
  Filled 2020-07-25: qty 50

## 2020-07-25 NOTE — Progress Notes (Signed)
PROGRESS NOTE    Philip Kerr  [id number] DOB: 09/08/1933 DOA: 07/22/2020 PCP: Biagio Borg, MD    Chief Complaint  Patient presents with   Chest Pain    Brief Narrative:    Philip Kerr is a 87 y.o. male with medical history significant for coronary artery disease with CABG in 1996, ischemic cardiomyopathy, type 2 diabetes mellitus, hypertension, and dementia,  presenting to the emergency department with chest pain.  Patient reports a chronic cough that may have been worsening over the past 3 or 4 days but became concerned when he developed severe chest pain while at rest last night , had minimal and stable elevation in troponin, no significant change in EKG, sent home after evaluation by cardiology after returning home, he continued to have worsening in his chest pain, nausea, cough, and return to the emergency department.  Patient and his wife had both been experiencing fever, nausea, and severe fatigue approximately 3 weeks ago and the patient felt that he had almost completely recovered from that before the onset of the current symptoms. -His work-up in ED was significant for transaminitis, pneumonia, as well he was COVID-19 positive, hospital course was complicated by severe delirium, and worsening renal failure.     Assessment & Plan:   Principal Problem:   Pneumonia Active Problems:   Diabetes mellitus type II, non insulin dependent (HCC)   Essential hypertension   Coronary artery disease   Chronic systolic CHF (congestive heart failure) (HCC)   Dementia with behavioral disturbance (HCC)   Depression   Chronic kidney disease, stage 3a (HCC)   Chest pain   Lab test positive for detection of COVID-19 virus   Bacterial pneumonia: - Presents with atypical chest pain and found to have positive COVID-19 pcr, new leukocytosis, and new consolidation on CXR concerning for PNA  - Presentation most consistent with recent COVID-19 infection and now secondary bacterial  pneumonia  -Initially on Rocephin and doxycycline, but currently transitioned to Zosyn.  AKI in CKD stage IIIa -Creatinine continues to trend up, it is up to 4.47 today, oliguric, overall has 450 cc urine output since 6 PM. -Input appreciated, it felt secondary to ATN due to hypoperfusion in setting of hypotension and ARB use. -Continue with IV fluids, will be very careful given known EF of 25%. -Renal ultrasound significant for bilateral renal cysts. -Avoid nephrotoxic medication, he did not receive any IV contrast. -Losartan has been discontinued -received albumin -Bicarb is low today, so  will change his fluid to bicarb drip with D5 given some low CBGs as well  COVID-19 positive  -Findings on chest x-ray suggest more bacterial pneumonia than COVID-19 of pneumonia, he has no hypoxia, so no indication for steroids, as well elevated LFTs, so cannot use remdesivir. -CT value of 32.6.  Abd pain -Looks diffusely tender today, he is with worsening procalcitonin, I will change his antibiotic to IV Zosyn, CT abdomen and pelvis without contrast significant for colonic involvement of left inguinal hernia without obstruction or strangulation,. -Findings significant for fat Foley catheter balloon malposition centrally within the prostate gland, please see discussion below  BPH, traumatic Foley insertion -Urology input greatly appreciated, CT finding of Foley catheter balloon in the prostate, unable to insert coud Foley catheter, urology consult greatly appreciated, catheter inserted, recommendation to continue Foley catheter for 2 weeks, and voiding trial after that. -We will start on tamsulosin after able to take p.o.  Transaminitis -Negative hepatitis panel . -Tylenol level <10 -Ultrasound with no evidence  of acute cholecystitis, common bile duct measures 8 mm, mild central intrahepatic bile duct dilation, likely chronic. -LFTs trending down. -Patient will not be able to tolerate MRCP given  his encephalopathy. -CT abdomen pelvis with no concerning findings and hepatobiliary system.  Severe hypophosphatemia -Phosphorus lipids severely low at<1, repleted,  Hypomagnesemia -Repleted  Macrocytosis -Low folic acid level, started on supplements  Chest pain; CAD  - Presents with chest pain at rest, had stable mild troponin elevation, and was seen by cardiology in the ED with low-suspicion for cardiac etiology - He has now developed a leukocytosis and increasing consolidation on CXR and his complaints are likely due to PNA  - Continue ASA, statin, beta-blocker    Chronic systolic CHF - EF was [id number] with severe LVH, diffuse hypokinesis, mild AI, moderate MR, and moderate LA enlargement in 2019  - Appears compensated  - Continue beta-blocker and ARB, monitor volume status    -No evidence of volume overload, continue with judicious IV fluids    Hypertension  -losartan has been discontinued in setting of AKI, blood pressure is soft so metoprolol has been discontinued   Type II DM  - Serum glucose is 214 in ED; A1c was 6.5% in December 2021  - He is diet-controlled at home  - Monitor CBGs and use low-intensity SSI if needed    Dementia  /severe hospital delirium -Patient remains significantly confused, restless, able to use haloperidol given EGC, he is on as needed Ativan, discussed with son/wife at bedside, they will try to get bedside today to decrease risk of delirium when he is seeing familiar faces.   DVT prophylaxis: Lovenox Code Status: DNR, confirmed by wife at bedside 7/18 Family Communication: D/W wife at bedside Prognosis: Guarded Disposition:   Status is: Inpatient  Remains inpatient appropriate because:IV treatments appropriate due to intensity of illness or inability to take PO  Dispo: The patient is from: Home              Anticipated d/c is to: Home              Patient currently is not medically stable to d/c.   Difficult to place patient  No       Consultants:  Renal urolgoy  Subjective:  With traumatic Foley catheter insertion yesterday, required eulogy to insert catheter, wife reports diarrhea x2 overnight, he remains with restlessness and confusion  Objective: Vitals:   07/24/20 2013 07/24/20 2149 07/25/20 0342 07/25/20 0835  BP: 123/71  130/67 (!) 121/51  Pulse: 93  91 87  Resp: 16  (!) 22 20  Temp: 98.6 F (37 C)  97.9 F (36.6 C) 98.1 F (36.7 C)  TempSrc: Oral  Axillary Oral  SpO2: 99%  97% 94%  Weight:  78.5 kg 78.3 kg     Intake/Output Summary (Last 24 hours) at 07/25/2020 1106 Last data filed at 07/25/2020 0916 Gross per 24 hour  Intake 2936.89 ml  Output 552 ml  Net 2384.89 ml   Filed Weights   07/23/20 0024 07/24/20 2149 07/25/20 0342  Weight: 76.1 kg 78.5 kg 78.3 kg    Examination:  Significantly confused, restless, unable to follow any commands or answer any qu good air entry bilaterally, no wheezing today, bibasilar Rales  Regular rate and rhythm, no rubs or gallops  abdomen with diffuse tenderness, bowel sounds diminished. No Cyanosis, Clubbing or edema, No new Rash or bruise     Data Reviewed: I have personally reviewed following labs and imaging studies  CBC: Recent Labs  Lab 07/22/20 0614 07/22/20 1805 07/23/20 0455 07/24/20 0547 07/25/20 0018  WBC 10.1 16.2* 19.4* 14.0* 11.5*  NEUTROABS  --   --  18.2* 11.7* 9.8*  HGB 12.4* 12.7* 11.2* 11.3* 10.4*  HCT 37.1* 37.5* 33.7* 33.8* 31.7*  MCV 105.7* 103.6* 104.3* 104.0* 105.7*  PLT 297 250 247 193 [id number]    Basic Metabolic Panel: Recent Labs  Lab 07/22/20 0614 07/22/20 1805 07/23/20 0455 07/24/20 0547 07/25/20 0018  NA 136 135 136 138 140  K 4.1 4.5 4.0 4.3 4.5  CL 106 107 110 111 113*  CO2 22 21* 20* 20* 15*  GLUCOSE 125* 214* 144* 95 76  BUN 23 23 28* 43* 51*  CREATININE 1.44* 1.26* 1.72* 3.35* 4.47*  CALCIUM 8.6* 8.7* 8.4* 7.9* 7.8*  MG  --   --  1.6* 2.2  --   PHOS  --   --  <1.0* 3.9 4.3     GFR: Estimated Creatinine Clearance: 12.7 mL/min (A) (by C-G formula based on SCr of 4.47 mg/dL (H)).  Liver Function Tests: Recent Labs  Lab 07/23/20 0455 07/24/20 0547 07/25/20 0018  AST 798* 159* 75*  ALT 338* 171* 110*  ALKPHOS 224* 191* 174*  BILITOT 4.3* 2.2* 1.3*  PROT 5.7* 5.4* 5.9*  ALBUMIN 2.6* 2.3* 2.9*    CBG: Recent Labs  Lab 07/24/20 1114 07/24/20 1702 07/24/20 2148 07/25/20 0840 07/25/20 1019  GLUCAP 96 94 81 65* 131*     Recent Results (from the past 240 hour(s))  Resp Panel by RT-PCR (Flu A&B, Covid) Nasopharyngeal Swab     Status: Abnormal   Collection Time: 07/22/20  7:43 AM   Specimen: Nasopharyngeal Swab; Nasopharyngeal(NP) swabs in vial transport medium  Result Value Ref Range Status   SARS Coronavirus 2 by RT PCR POSITIVE (A) NEGATIVE Final    Comment: RESULT CALLED TO, READ BACK BY AND VERIFIED WITH: RN L MEEKS [id number] (NOTE) SARS-CoV-2 target nucleic acids are DETECTED.  The SARS-CoV-2 RNA is generally detectable in upper respiratory specimens during the acute phase of infection. Positive results are indicative of the presence of the identified virus, but do not rule out bacterial infection or co-infection with other pathogens not detected by the test. Clinical correlation with patient history and other diagnostic information is necessary to determine patient infection status. The expected result is Negative.  Fact Sheet for Patients: EntrepreneurPulse.com.au  Fact Sheet for Healthcare Providers: IncredibleEmployment.be  This test is not yet approved or cleared by the [place] FDA and  has been authorized for detection and/or diagnosis of SARS-CoV-2 by FDA under an Emergency Use Authorization (EUA).  This EUA will remain in effect (meaning this test can be used)  for the duration of  the COVID-19 declaration under Section 564(b)(1) of the Act, 21 U.S.C. section 360bbb-3(b)(1),  unless the authorization is terminated or revoked sooner.     Influenza A by PCR NEGATIVE NEGATIVE Final   Influenza B by PCR NEGATIVE NEGATIVE Final    Comment: (NOTE) The Xpert Xpress SARS-CoV-2/FLU/RSV plus assay is intended as an aid in the diagnosis of influenza from Nasopharyngeal swab specimens and should not be used as a sole basis for treatment. Nasal washings and aspirates are unacceptable for Xpert Xpress SARS-CoV-2/FLU/RSV testing.  Fact Sheet for Patients: EntrepreneurPulse.com.au  Fact Sheet for Healthcare Providers: IncredibleEmployment.be  This test is not yet approved or cleared by the [place] FDA and has been authorized for detection and/or diagnosis of SARS-CoV-2 by FDA  under an Emergency Use Authorization (EUA). This EUA will remain in effect (meaning this test can be used) for the duration of the COVID-19 declaration under Section 564(b)(1) of the Act, 21 U.S.C. section 360bbb-3(b)(1), unless the authorization is terminated or revoked.  Performed at [facility], [place] [address], [place], [place] [address]          Radiology Studies: CT ABDOMEN PELVIS WO CONTRAST  Result Date: 07/24/2020 CLINICAL DATA:  Abdominal pain EXAM: CT ABDOMEN AND PELVIS WITHOUT CONTRAST TECHNIQUE: Multidetector CT imaging of the abdomen and pelvis was performed following the standard protocol without IV contrast. COMPARISON:  None. FINDINGS: Lower chest: Previous CABG. Trace right pleural effusion. Coarse parenchymal and airspace opacities at the left lung base. 1 cm nodular opacity in the posterior right lower lobe (Im16,Se5) . Hepatobiliary: No focal liver abnormality is seen. No gallstones, gallbladder wall thickening, or biliary dilatation. Pancreas: Unremarkable. No pancreatic ductal dilatation or surrounding inflammatory changes. Spleen: Normal in size without focal abnormality. Adrenals/Urinary Tract: Adrenal glands unremarkable.  Bilateral partially exophytic renal lesions possibly cysts but incompletely characterized, largest 2.9 cm from the right lower pole. No renal calculus or hydronephrosis. Innumerable partially calcified stones measuring up to 1.1 cm layer in the nondistended urinary bladder. There is 1.9 cm right lateral diverticulum containing stones. There is bladder wall thickening. Foley catheter has its balloon within the prostate gland. Stomach/Bowel: Stomach is nondistended. The small bowel is decompressed. Colon is nondistended with scattered diverticula most numerous in the sigmoid 7 segment, without adjacent inflammatory change. The descending/sigmoid junction protrudes into left inguinal hernia, without obstruction or strangulation. Vascular/Lymphatic: Extensive calcified aortoiliac plaque without aneurysm. No abdominal or pelvic adenopathy. Reproductive: Prostate enlargement. The Foley catheter balloon is positioned centrally within the prostate. Other: Left pelvic phleboliths.  No ascites.  No free air. Musculoskeletal: Left inguinal hernia, involving the descending/sigmoid junction of the colon without obstruction or strangulation. Right inguinal hernia containing mostly fat, possible nondistended testicle. Sternotomy wires. No fracture or worrisome bone lesion. IMPRESSION: 1. Colonic involvement in left inguinal hernia, without obstruction or strangulation. 2. Foley catheter balloon malpositioned centrally within the prostate gland. 3. Innumerable bladder calculi. 4. Colonic diverticulosis 5. Coarse left lower lobe airspace opacities of uncertain chronicity. Recommend comparison to any previous available outside studies. 6.  Aortic Atherosclerosis (ICD10-170.0). Electronically Signed   By: Lucrezia Europe M.D.   On: 07/24/2020 14:15   US RENAL  Result Date: 07/24/2020 CLINICAL DATA:  COVID-19 positive.  Acute renal insufficiency. EXAM: RENAL / URINARY TRACT ULTRASOUND COMPLETE COMPARISON:  None. FINDINGS: Right Kidney:  Renal measurements: 12.1 x 6.6 x 6.9 cm = volume: 288 mL. Multiple cysts, less than 10, identified throughout the right kidney. The largest inferiorly measures 2.3 x 2.1 x 2.6 cm. Left Kidney: Renal measurements: 14.1 x 7.6 x 7.0 cm = volume: 391 mL. Several cysts, less than 10, identified throughout the left kidney. The largest superiorly measures 2.0 x 2.3 x 2.4 cm. Bladder: Decompressed with a Foley catheter. Other: None. IMPRESSION: 1. Bilateral renal cysts.  No other significant abnormalities. Electronically Signed   By: Dorise Bullion III M.D   On: 07/24/2020 12:45   DG Chest Port 1 View  Result Date: 07/24/2020 CLINICAL DATA:  Severe shortness of breath.  COVID positive EXAM: PORTABLE CHEST 1 VIEW COMPARISON:  Radiograph 07/22/2020 FINDINGS: Sternal wires over enlarged cardiac silhouette. There is LEFT lower lobe atelectasis and small effusion. RIGHT lung clear. IMPRESSION: 1. No interval change. 2. LEFT lower lobe atelectasis  and effusion. Electronically Signed   By: Suzy Bouchard M.D.   On: 07/24/2020 11:45   US Abdomen Limited RUQ (LIVER/GB)  Result Date: 07/23/2020 CLINICAL DATA:  Transaminitis. EXAM: ULTRASOUND ABDOMEN LIMITED RIGHT UPPER QUADRANT COMPARISON:  None. FINDINGS: Gallbladder: No gallstones or wall thickening visualized. No sonographic Murphy sign noted by sonographer. Common bile duct: Diameter: 8 mm with distal tapering.  No evidence of a duct stone. Liver: Coarsened echotexture. Normal overall liver size. No mass or focal lesion. Mild central intrahepatic bile duct dilation. Portal vein is patent on color Doppler imaging with normal direction of blood flow towards the liver. Other: None. IMPRESSION: 1. No acute findings. 2. Common bile duct measures 8 mm and there is mild central intrahepatic bile duct dilation. This is likely chronic. However, if there are symptoms biliary obstruction, follow-up MRCP or ERCP would be recommended. 3. Coarsened echotexture of the liver,  nonspecific. Normal liver size. No masses. Electronically Signed   By: Lajean Manes M.D.   On: 07/23/2020 18:44        Scheduled Meds:  aspirin EC  81 mg Oral q morning   Chlorhexidine Gluconate Cloth  6 each Topical Daily   folic acid  1 mg Oral Daily   [START ON 07/26/2020] heparin injection (subcutaneous)  5,000 Units Subcutaneous Q8H   insulin aspart  0-5 Units Subcutaneous QHS   insulin aspart  0-9 Units Subcutaneous TID WC   Continuous Infusions:  piperacillin-tazobactam (ZOSYN)  IV 100 mL/hr at 07/25/20 0326   sodium bicarbonate 150 mEq in D5W infusion       LOS: 3 days      Phillips Climes, MD [facility]   To contact the attending provider between 7A-7P or the covering provider during after hours 7P-7A, please log into the web site [email or web address] and access using universal [place] password for that web site. If you do not have the password, please call the hospital operator.  07/25/2020, 11:06 AM

## 2020-07-25 NOTE — Progress Notes (Signed)
Subjective:  Pt seen -  family at bedside-  he is making jokes-  saying that he wants to go home-  wont admit to feeling badly-  UOP seeming better after foley replaced, total of 700 over last 24 hours-  hematuria clearing but his BUN and crt are rising  Objective Vital signs in last 24 hours: Vitals:   07/25/20 0342 07/25/20 0835 07/25/20 1248 07/25/20 1252  BP: 130/67 (!) 121/51  126/61  Pulse: 91 87  72  Resp: (!) 22 20  (!) 23  Temp: 97.9 F (36.6 C) 98.1 F (36.7 C) 97.7 F (36.5 C)   TempSrc: Axillary Oral Oral   SpO2: 97% 94%  96%  Weight: 78.3 kg      Weight change:   Intake/Output Summary (Last 24 hours) at 07/25/2020 1449 Last data filed at 07/25/2020 1113 Gross per 24 hour  Intake 2926.89 ml  Output 652 ml  Net 2274.89 ml    Assessment/ Plan: Pt is a 87 y.o. yo male with CAD, DM, HTN, ICM who was admitted on 07/22/2020 with  symptomatic covid/PNA-  developed AKI when BP dropped on ARB Assessment/Plan: 1. Renal-  AKI in the setting of hypotension +/- obstruction -  UOP decreased but  now seems to be picking up-  foley in place.  BUN and crt still trending up but patient does not seem to be uremic and there are no acute indications for HD.  I did warn all in the room that it could be a possibility however 2. HTN/volume-  not wet-  now that bicarb has dropped to 15 have changed IVF to bicarb 3. Anemia-  not a significant issue at this time but is dropping  4. Metabolic acidosis-  giving bicarb   [name]    Labs: Basic Metabolic Panel: Recent Labs  Lab 07/23/20 0455 07/24/20 0547 07/25/20 0018  NA 136 138 140  K 4.0 4.3 4.5  CL 110 111 113*  CO2 20* 20* 15*  GLUCOSE 144* 95 76  BUN 28* 43* 51*  CREATININE 1.72* 3.35* 4.47*  CALCIUM 8.4* 7.9* 7.8*  PHOS <1.0* 3.9 4.3   Liver Function Tests: Recent Labs  Lab 07/23/20 0455 07/24/20 0547 07/25/20 0018  AST 798* 159* 75*  ALT 338* 171* 110*  ALKPHOS 224* 191* 174*  BILITOT 4.3* 2.2* 1.3*   PROT 5.7* 5.4* 5.9*  ALBUMIN 2.6* 2.3* 2.9*   No results for input(s): LIPASE, AMYLASE in the last 168 hours. Recent Labs  Lab 07/23/20 1410  AMMONIA 29   CBC: Recent Labs  Lab 07/22/20 0614 07/22/20 1805 07/23/20 0455 07/24/20 0547 07/25/20 0018  WBC 10.1 16.2* 19.4* 14.0* 11.5*  NEUTROABS  --   --  18.2* 11.7* 9.8*  HGB 12.4* 12.7* 11.2* 11.3* 10.4*  HCT 37.1* 37.5* 33.7* 33.8* 31.7*  MCV 105.7* 103.6* 104.3* 104.0* 105.7*  PLT 297 250 247 193 170   Cardiac Enzymes: No results for input(s): CKTOTAL, CKMB, CKMBINDEX, TROPONINI in the last 168 hours. CBG: Recent Labs  Lab 07/24/20 1702 07/24/20 2148 07/25/20 0840 07/25/20 1019 07/25/20 1246  GLUCAP 94 81 65* 131* 102*    Iron Studies:  Recent Labs    07/25/20 0018  FERRITIN 305   Studies/Results: CT ABDOMEN PELVIS WO CONTRAST  Result Date: 07/24/2020 CLINICAL DATA:  Abdominal pain EXAM: CT ABDOMEN AND PELVIS WITHOUT CONTRAST TECHNIQUE: Multidetector CT imaging of the abdomen and pelvis was performed following the standard protocol without IV contrast. COMPARISON:  None. FINDINGS: Lower  chest: Previous CABG. Trace right pleural effusion. Coarse parenchymal and airspace opacities at the left lung base. 1 cm nodular opacity in the posterior right lower lobe (Im16,Se5) . Hepatobiliary: No focal liver abnormality is seen. No gallstones, gallbladder wall thickening, or biliary dilatation. Pancreas: Unremarkable. No pancreatic ductal dilatation or surrounding inflammatory changes. Spleen: Normal in size without focal abnormality. Adrenals/Urinary Tract: Adrenal glands unremarkable. Bilateral partially exophytic renal lesions possibly cysts but incompletely characterized, largest 2.9 cm from the right lower pole. No renal calculus or hydronephrosis. Innumerable partially calcified stones measuring up to 1.1 cm layer in the nondistended urinary bladder. There is 1.9 cm right lateral diverticulum containing stones. There is  bladder wall thickening. Foley catheter has its balloon within the prostate gland. Stomach/Bowel: Stomach is nondistended. The small bowel is decompressed. Colon is nondistended with scattered diverticula most numerous in the sigmoid 7 segment, without adjacent inflammatory change. The descending/sigmoid junction protrudes into left inguinal hernia, without obstruction or strangulation. Vascular/Lymphatic: Extensive calcified aortoiliac plaque without aneurysm. No abdominal or pelvic adenopathy. Reproductive: Prostate enlargement. The Foley catheter balloon is positioned centrally within the prostate. Other: Left pelvic phleboliths.  No ascites.  No free air. Musculoskeletal: Left inguinal hernia, involving the descending/sigmoid junction of the colon without obstruction or strangulation. Right inguinal hernia containing mostly fat, possible nondistended testicle. Sternotomy wires. No fracture or worrisome bone lesion. IMPRESSION: 1. Colonic involvement in left inguinal hernia, without obstruction or strangulation. 2. Foley catheter balloon malpositioned centrally within the prostate gland. 3. Innumerable bladder calculi. 4. Colonic diverticulosis 5. Coarse left lower lobe airspace opacities of uncertain chronicity. Recommend comparison to any previous available outside studies. 6.  Aortic Atherosclerosis (ICD10-170.0). Electronically Signed   By: Lucrezia Europe M.D.   On: 07/24/2020 14:15   US RENAL  Result Date: 07/24/2020 CLINICAL DATA:  COVID-19 positive.  Acute renal insufficiency. EXAM: RENAL / URINARY TRACT ULTRASOUND COMPLETE COMPARISON:  None. FINDINGS: Right Kidney: Renal measurements: 12.1 x 6.6 x 6.9 cm = volume: 288 mL. Multiple cysts, less than 10, identified throughout the right kidney. The largest inferiorly measures 2.3 x 2.1 x 2.6 cm. Left Kidney: Renal measurements: 14.1 x 7.6 x 7.0 cm = volume: 391 mL. Several cysts, less than 10, identified throughout the left kidney. The largest superiorly  measures 2.0 x 2.3 x 2.4 cm. Bladder: Decompressed with a Foley catheter. Other: None. IMPRESSION: 1. Bilateral renal cysts.  No other significant abnormalities. Electronically Signed   By: Dorise Bullion III M.D   On: 07/24/2020 12:45   DG Chest Port 1 View  Result Date: 07/24/2020 CLINICAL DATA:  Severe shortness of breath.  COVID positive EXAM: PORTABLE CHEST 1 VIEW COMPARISON:  Radiograph 07/22/2020 FINDINGS: Sternal wires over enlarged cardiac silhouette. There is LEFT lower lobe atelectasis and small effusion. RIGHT lung clear. IMPRESSION: 1. No interval change. 2. LEFT lower lobe atelectasis and effusion. Electronically Signed   By: Suzy Bouchard M.D.   On: 07/24/2020 11:45   US Abdomen Limited RUQ (LIVER/GB)  Result Date: 07/23/2020 CLINICAL DATA:  Transaminitis. EXAM: ULTRASOUND ABDOMEN LIMITED RIGHT UPPER QUADRANT COMPARISON:  None. FINDINGS: Gallbladder: No gallstones or wall thickening visualized. No sonographic Murphy sign noted by sonographer. Common bile duct: Diameter: 8 mm with distal tapering.  No evidence of a duct stone. Liver: Coarsened echotexture. Normal overall liver size. No mass or focal lesion. Mild central intrahepatic bile duct dilation. Portal vein is patent on color Doppler imaging with normal direction of blood flow towards the liver. Other: None. IMPRESSION: 1.  No acute findings. 2. Common bile duct measures 8 mm and there is mild central intrahepatic bile duct dilation. This is likely chronic. However, if there are symptoms biliary obstruction, follow-up MRCP or ERCP would be recommended. 3. Coarsened echotexture of the liver, nonspecific. Normal liver size. No masses. Electronically Signed   By: Lajean Manes M.D.   On: 07/23/2020 18:44   Medications: Infusions:  piperacillin-tazobactam (ZOSYN)  IV 2.25 g (07/25/20 1111)   sodium bicarbonate 150 mEq in D5W infusion 100 mL/hr at 07/25/20 1250    Scheduled Medications:  aspirin EC  81 mg Oral q morning    Chlorhexidine Gluconate Cloth  6 each Topical Daily   folic acid  1 mg Oral Daily   [START ON 07/26/2020] heparin injection (subcutaneous)  5,000 Units Subcutaneous Q8H   insulin aspart  0-5 Units Subcutaneous QHS   insulin aspart  0-9 Units Subcutaneous TID WC   tamsulosin  0.4 mg Oral QPC supper    have reviewed scheduled and prn medications.  Physical Exam: General: NAD Heart: RRR Lungs: mostly clear Abdomen: soft, non tender Extremities: min edema     07/25/2020,2:49 PM  LOS: 3 days

## 2020-07-25 NOTE — Progress Notes (Signed)
Hypoglycemic Event  CBG: 65  Treatment: D50 25 mL (12.5 gm)  Symptoms: None  Follow-up CBG: Time:1020 CBG Result:131  Possible Reasons for Event: Inadequate meal intake  Comments/MD notified: Patient currently NPO   Philip Kerr

## 2020-07-26 DIAGNOSIS — R410 Disorientation, unspecified: Secondary | ICD-10-CM | POA: Diagnosis not present

## 2020-07-26 DIAGNOSIS — J189 Pneumonia, unspecified organism: Secondary | ICD-10-CM | POA: Diagnosis not present

## 2020-07-26 DIAGNOSIS — R7401 Elevation of levels of liver transaminase levels: Secondary | ICD-10-CM | POA: Diagnosis not present

## 2020-07-26 DIAGNOSIS — N179 Acute kidney failure, unspecified: Secondary | ICD-10-CM | POA: Diagnosis not present

## 2020-07-26 LAB — CBC WITH DIFFERENTIAL/PLATELET
Abs Immature Granulocytes: 0.05 10*3/uL (ref 0.00–0.07)
Basophils Absolute: 0 10*3/uL (ref 0.0–0.1)
Basophils Relative: 1 %
Eosinophils Absolute: 0.5 10*3/uL (ref 0.0–0.5)
Eosinophils Relative: 6 %
HCT: 26.8 % — ABNORMAL LOW (ref 39.0–52.0)
Hemoglobin: 9.1 g/dL — ABNORMAL LOW (ref 13.0–17.0)
Immature Granulocytes: 1 %
Lymphocytes Relative: 14 %
Lymphs Abs: 1.2 10*3/uL (ref 0.7–4.0)
MCH: 34.3 pg — ABNORMAL HIGH (ref 26.0–34.0)
MCHC: 34 g/dL (ref 30.0–36.0)
MCV: 101.1 fL — ABNORMAL HIGH (ref 80.0–100.0)
Monocytes Absolute: 0.7 10*3/uL (ref 0.1–1.0)
Monocytes Relative: 8 %
Neutro Abs: 6 10*3/uL (ref 1.7–7.7)
Neutrophils Relative %: 70 %
Platelets: 142 10*3/uL — ABNORMAL LOW (ref 150–400)
RBC: 2.65 MIL/uL — ABNORMAL LOW (ref 4.22–5.81)
RDW: 13.3 % (ref 11.5–15.5)
WBC: 8.4 10*3/uL (ref 4.0–10.5)
nRBC: 0 % (ref 0.0–0.2)

## 2020-07-26 LAB — GLUCOSE, CAPILLARY
Glucose-Capillary: 175 mg/dL — ABNORMAL HIGH (ref 70–99)
Glucose-Capillary: 146 mg/dL — ABNORMAL HIGH (ref 70–99)
Glucose-Capillary: 167 mg/dL — ABNORMAL HIGH (ref 70–99)
Glucose-Capillary: 143 mg/dL — ABNORMAL HIGH (ref 70–99)

## 2020-07-26 LAB — PROCALCITONIN: Procalcitonin: 9.52 ng/mL

## 2020-07-26 LAB — COMPREHENSIVE METABOLIC PANEL
ALT: 62 U/L — ABNORMAL HIGH (ref 0–44)
AST: 34 U/L (ref 15–41)
Albumin: 2.6 g/dL — ABNORMAL LOW (ref 3.5–5.0)
Alkaline Phosphatase: 133 U/L — ABNORMAL HIGH (ref 38–126)
Anion gap: 10 (ref 5–15)
BUN: 51 mg/dL — ABNORMAL HIGH (ref 8–23)
CO2: 19 mmol/L — ABNORMAL LOW (ref 22–32)
Calcium: 7.5 mg/dL — ABNORMAL LOW (ref 8.9–10.3)
Chloride: 108 mmol/L (ref 98–111)
Creatinine, Ser: 5.3 mg/dL — ABNORMAL HIGH (ref 0.61–1.24)
GFR, Estimated: 10 mL/min — ABNORMAL LOW (ref >60)
Glucose, Bld: 220 mg/dL — ABNORMAL HIGH (ref 70–99)
Potassium: 3.9 mmol/L (ref 3.5–5.1)
Sodium: 137 mmol/L (ref 135–145)
Total Bilirubin: 0.8 mg/dL (ref 0.3–1.2)
Total Protein: 5.2 g/dL — ABNORMAL LOW (ref 6.5–8.1)

## 2020-07-26 LAB — C-REACTIVE PROTEIN: CRP: 8.9 mg/dL — ABNORMAL HIGH (ref <1.0)

## 2020-07-26 LAB — FERRITIN: Ferritin: 132 ng/mL (ref 24–336)

## 2020-07-26 LAB — PHOSPHORUS: Phosphorus: 3.1 mg/dL (ref 2.5–4.6)

## 2020-07-26 MED ORDER — CEFEPIME HCL 1 G IJ SOLR
1.0000 g | INTRAMUSCULAR | Status: AC
  Administered 2020-07-26 – 2020-07-28 (×3): 1 g via INTRAVENOUS
  Filled 2020-07-26 (×4): qty 1

## 2020-07-26 MED ORDER — LOPERAMIDE HCL 2 MG PO CAPS
2.0000 mg | ORAL_CAPSULE | Freq: Once | ORAL | Status: AC
  Administered 2020-07-26: 2 mg via ORAL
  Filled 2020-07-26: qty 1

## 2020-07-26 NOTE — Progress Notes (Signed)
Subjective:  Pt seen -  family at bedside-  he is making jokes-  again saying that he wants to go home-  wont admit to feeling badly-  UOP stable-  total of 700 over last 24 hours-  hematuria clearing but his BUN and crt are rising again  Objective Vital signs in last 24 hours: Vitals:   07/26/20 0221 07/26/20 0348 07/26/20 0752 07/26/20 1144  BP:  (!) 110/59 (!) 136/51 (!) 132/46  Pulse:  65 72 (!) 59  Resp:  '17 20 18  '$ Temp:  98 F (36.7 C) 98.1 F (36.7 C) 97.8 F (36.6 C)  TempSrc:  Oral Axillary Axillary  SpO2:  100% 94% 95%  Weight: 79 kg      Weight change: 0.5 kg  Intake/Output Summary (Last 24 hours) at 07/26/2020 1256 Last data filed at 07/26/2020 1146 Gross per 24 hour  Intake 373.57 ml  Output 790 ml  Net -416.43 ml    Assessment/ Plan: Pt is a 87 y.o. yo male with CAD, DM, HTN, ICM who was admitted on 07/22/2020 with  symptomatic covid/PNA-  developed AKI when BP dropped on ARB Assessment/Plan: 1. Renal-  AKI in the setting of covid illness, hypotension +/- obstruction -  UOP decreased but  now seems to be picking up-  foley in place.  BUN and crt still trending up but patient does not seem to be uremic and there are no acute indications for HD.  I did warn all in the room that it could be a possibility however not yet 2. HTN/volume-  not wet-  now that bicarb has dropped to 15 have changed IVF to bicarb-  continue with IVF for now 3. Anemia-  not a significant issue at this time but is dropping  4. Metabolic acidosis-  giving bicarb   [name]    Labs: Basic Metabolic Panel: Recent Labs  Lab 07/24/20 0547 07/25/20 0018 07/26/20 0109  NA 138 140 137  K 4.3 4.5 3.9  CL 111 113* 108  CO2 20* 15* 19*  GLUCOSE 95 76 220*  BUN 43* 51* 51*  CREATININE 3.35* 4.47* 5.30*  CALCIUM 7.9* 7.8* 7.5*  PHOS 3.9 4.3 3.1   Liver Function Tests: Recent Labs  Lab 07/24/20 0547 07/25/20 0018 07/26/20 0109  AST 159* 75* 34  ALT 171* 110* 62*  ALKPHOS  191* 174* 133*  BILITOT 2.2* 1.3* 0.8  PROT 5.4* 5.9* 5.2*  ALBUMIN 2.3* 2.9* 2.6*   No results for input(s): LIPASE, AMYLASE in the last 168 hours. Recent Labs  Lab 07/23/20 1410  AMMONIA 29   CBC: Recent Labs  Lab 07/22/20 1805 07/22/20 1805 07/23/20 0455 07/24/20 0547 07/25/20 0018 07/26/20 0109  WBC 16.2*  --  19.4* 14.0* 11.5* 8.4  NEUTROABS  --    < > 18.2* 11.7* 9.8* 6.0  HGB 12.7*  --  11.2* 11.3* 10.4* 9.1*  HCT 37.5*  --  33.7* 33.8* 31.7* 26.8*  MCV 103.6*  --  104.3* 104.0* 105.7* 101.1*  PLT 250  --  247 193 170 142*   < > = values in this interval not displayed.   Cardiac Enzymes: No results for input(s): CKTOTAL, CKMB, CKMBINDEX, TROPONINI in the last 168 hours. CBG: Recent Labs  Lab 07/25/20 1246 07/25/20 1605 07/25/20 2047 07/26/20 0746 07/26/20 1143  GLUCAP 102* 127* 188* 175* 146*    Iron Studies:  Recent Labs    07/26/20 0109  FERRITIN 132   Studies/Results: CT ABDOMEN PELVIS  WO CONTRAST  Result Date: 07/24/2020 CLINICAL DATA:  Abdominal pain EXAM: CT ABDOMEN AND PELVIS WITHOUT CONTRAST TECHNIQUE: Multidetector CT imaging of the abdomen and pelvis was performed following the standard protocol without IV contrast. COMPARISON:  None. FINDINGS: Lower chest: Previous CABG. Trace right pleural effusion. Coarse parenchymal and airspace opacities at the left lung base. 1 cm nodular opacity in the posterior right lower lobe (Im16,Se5) . Hepatobiliary: No focal liver abnormality is seen. No gallstones, gallbladder wall thickening, or biliary dilatation. Pancreas: Unremarkable. No pancreatic ductal dilatation or surrounding inflammatory changes. Spleen: Normal in size without focal abnormality. Adrenals/Urinary Tract: Adrenal glands unremarkable. Bilateral partially exophytic renal lesions possibly cysts but incompletely characterized, largest 2.9 cm from the right lower pole. No renal calculus or hydronephrosis. Innumerable partially calcified stones  measuring up to 1.1 cm layer in the nondistended urinary bladder. There is 1.9 cm right lateral diverticulum containing stones. There is bladder wall thickening. Foley catheter has its balloon within the prostate gland. Stomach/Bowel: Stomach is nondistended. The small bowel is decompressed. Colon is nondistended with scattered diverticula most numerous in the sigmoid 7 segment, without adjacent inflammatory change. The descending/sigmoid junction protrudes into left inguinal hernia, without obstruction or strangulation. Vascular/Lymphatic: Extensive calcified aortoiliac plaque without aneurysm. No abdominal or pelvic adenopathy. Reproductive: Prostate enlargement. The Foley catheter balloon is positioned centrally within the prostate. Other: Left pelvic phleboliths.  No ascites.  No free air. Musculoskeletal: Left inguinal hernia, involving the descending/sigmoid junction of the colon without obstruction or strangulation. Right inguinal hernia containing mostly fat, possible nondistended testicle. Sternotomy wires. No fracture or worrisome bone lesion. IMPRESSION: 1. Colonic involvement in left inguinal hernia, without obstruction or strangulation. 2. Foley catheter balloon malpositioned centrally within the prostate gland. 3. Innumerable bladder calculi. 4. Colonic diverticulosis 5. Coarse left lower lobe airspace opacities of uncertain chronicity. Recommend comparison to any previous available outside studies. 6.  Aortic Atherosclerosis (ICD10-170.0). Electronically Signed   By: Lucrezia Europe M.D.   On: 07/24/2020 14:15   Medications: Infusions:  ceFEPime (MAXIPIME) IV     sodium bicarbonate 150 mEq in D5W infusion 100 mL/hr at 07/26/20 0113    Scheduled Medications:  aspirin EC  81 mg Oral q morning   Chlorhexidine Gluconate Cloth  6 each Topical Daily   folic acid  1 mg Oral Daily   heparin injection (subcutaneous)  5,000 Units Subcutaneous Q8H   insulin aspart  0-5 Units Subcutaneous QHS   insulin  aspart  0-9 Units Subcutaneous TID WC   tamsulosin  0.4 mg Oral QPC supper    have reviewed scheduled and prn medications.  Physical Exam: General: NAD Heart: RRR Lungs: mostly clear Abdomen: soft, non tender Extremities: min edema     07/26/2020,12:56 PM  LOS: 4 days

## 2020-07-26 NOTE — Evaluation (Signed)
Physical Therapy Evaluation Patient Details Name: Philip Kerr MRN: [id number] DOB: [birth year] Today's Date: 07/26/2020   History of Present Illness  87 y.o. male presented 07/22/20 to the emergency department with chest pain. His work-up in ED was significant for transaminitis, pneumonia, as well he was COVID-19 positive, hospital course was complicated by severe delirium, and worsening renal failure.  PMH significant for coronary artery disease with CABG in 1996, ischemic cardiomyopathy, type 2 diabetes mellitus, hypertension, and dementia.  Clinical Impression   Pt admitted secondary to problem above with deficits below. PTA patient was independent with all basic mobility and ADLs and used no adaptive equipment. Pt currently requires min assist for transfers and walking (also without a device). Patient has had a small decline in function since hospitalized and can benefit from continued PT. Anticipate patient will benefit from PT to address problems listed below.Will continue to follow acutely to maximize functional mobility independence and safety.       Follow Up Recommendations No PT follow up (anticipate no needs once he progresses mobility in the hospital)    Equipment Recommendations  None recommended by PT    Recommendations for Other Services OT consult     Precautions / Restrictions Precautions Precautions: Fall Precaution Comments: HOH      Mobility  Bed Mobility Overal bed mobility: Needs Assistance Bed Mobility: Sit to Supine       Sit to supine: Supervision   General bed mobility comments: pt paid no attention to his catheter and other lines/tubes; supervision for safety with lines    Transfers Overall transfer level: Needs assistance Equipment used: None Transfers: Sit to/from Stand Sit to Stand: Min guard         General transfer comment: required 2 attempts to successfully come to stand with minguard assist and pt pushing off recliner with bil  UEs  Ambulation/Gait Ambulation/Gait assistance: Min assist Gait Distance (Feet): 3 Feet Assistive device: None Gait Pattern/deviations: Step-to pattern;Decreased stride length;Shuffle     General Gait Details: pt adamant on going straight back to bed from recliner and would not walk due to fatigue  Stairs            Wheelchair Mobility    Modified Rankin (Stroke Patients Only)       Balance Overall balance assessment: Needs assistance Sitting-balance support: No upper extremity supported;Feet supported Sitting balance-Leahy Scale: Fair     Standing balance support: Single extremity supported Standing balance-Leahy Scale: Poor                               Pertinent Vitals/Pain Pain Assessment: No/denies pain    Home Living Family/patient expects to be discharged to:: Private residence Living Arrangements: Spouse/significant other Available Help at Discharge: Family;Available 24 hours/day (all live [place] farm together per son') Type of Home: House Home Access: Stairs to enter Entrance Stairs-Rails: Right Entrance Stairs-Number of Steps: 5 Home Layout: One level Home Equipment: None      Prior Function Level of Independence: Independent         Comments: per son, independent with all ADLs (as is his wife)     Hand Dominance        Extremity/Trunk Assessment   Upper Extremity Assessment Upper Extremity Assessment: Overall WFL for tasks assessed    Lower Extremity Assessment Lower Extremity Assessment: Overall WFL for tasks assessed    Cervical / Trunk Assessment Cervical / Trunk Assessment: Normal  Communication  Communication: HOH  Cognition Arousal/Alertness: Awake/alert Behavior During Therapy: Impulsive Overall Cognitive Status: Impaired/Different from baseline Area of Impairment: Orientation;Safety/judgement;Awareness;Problem solving                 Orientation Level: Place (time NT)       Safety/Judgement:  Decreased awareness of safety Awareness: Intellectual Problem Solving: Requires verbal cues;Requires tactile cues General Comments: pt not attending to lines/IV despite multiple cues to wait and let PT get him ready to go (pt very eager to get back to bed)      General Comments General comments (skin integrity, edema, etc.): HR 60s; sats 92+ throughout    Exercises     Assessment/Plan    PT Assessment Patient needs continued PT services  PT Problem List Decreased activity tolerance;Decreased balance;Decreased mobility;Decreased cognition;Decreased knowledge of use of DME;Decreased safety awareness;Decreased knowledge of precautions       PT Treatment Interventions DME instruction;Gait training;Stair training;Functional mobility training;Therapeutic activities;Therapeutic exercise;Patient/family education    PT Goals (Current goals can be found in the Care Plan section)  Acute Rehab PT Goals Patient Stated Goal: go home ASAP PT Goal Formulation: With patient/family Time For Goal Achievement: 08/09/20 Potential to Achieve Goals: Good    Frequency Min 3X/week   Barriers to discharge        Co-evaluation               AM-PAC PT "6 Clicks" Mobility  Outcome Measure Help needed turning from your back to your side while in a flat bed without using bedrails?: None Help needed moving from lying on your back to sitting on the side of a flat bed without using bedrails?: A Little Help needed moving to and from a bed to a chair (including a wheelchair)?: A Little Help needed standing up from a chair using your arms (e.g., wheelchair or bedside chair)?: A Little Help needed to walk in hospital room?: A Little Help needed climbing 3-5 steps with a railing? : A Little 6 Click Score: 19    End of Session Equipment Utilized During Treatment: Gait belt Activity Tolerance: Patient limited by fatigue Patient left: in bed;with call bell/phone within reach;with bed alarm set;with  family/visitor present Nurse Communication: Mobility status PT Visit Diagnosis: Unsteadiness on feet (R26.81)    Time: [id number] PT Time Calculation (min) (ACUTE ONLY): 16 min   Charges:   PT Evaluation $PT Eval Low Complexity: 1 Low           Arby Barrette, PT Pager [phone]   Rexanne Mano 07/26/2020, 4:34 PM

## 2020-07-26 NOTE — Progress Notes (Addendum)
PROGRESS NOTE    Philip Kerr  [id number] DOB: 04-Jun-1933 DOA: 07/22/2020 PCP: Biagio Borg, MD    Chief Complaint  Patient presents with   Chest Pain    Brief Narrative:    Philip Kerr is a 87 y.o. male with medical history significant for coronary artery disease with CABG in 1996, ischemic cardiomyopathy, type 2 diabetes mellitus, hypertension, and dementia,  presenting to the emergency department with chest pain.  Patient reports a chronic cough that may have been worsening over the past 3 or 4 days but became concerned when he developed severe chest pain while at rest last night , had minimal and stable elevation in troponin, no significant change in EKG, sent home after evaluation by cardiology after returning home, he continued to have worsening in his chest pain, nausea, cough, and return to the emergency department.  Patient and his wife had both been experiencing fever, nausea, and severe fatigue approximately 3 weeks ago and the patient felt that he had almost completely recovered from that before the onset of the current symptoms. -His work-up in ED was significant for transaminitis, pneumonia, as well he was COVID-19 positive, hospital course was complicated by severe delirium, and worsening renal failure.     Assessment & Plan:   Principal Problem:   Pneumonia Active Problems:   Diabetes mellitus type II, non insulin dependent (HCC)   Essential hypertension   Coronary artery disease   Chronic systolic CHF (congestive heart failure) (HCC)   Dementia with behavioral disturbance (HCC)   Depression   Chronic kidney disease, stage 3a (HCC)   Chest pain   Lab test positive for detection of COVID-19 virus   Bacterial pneumonia: - Presents with atypical chest pain and found to have positive COVID-19 pcr, new leukocytosis, and new consolidation on CXR concerning for PNA  - Presentation most consistent with recent COVID-19 infection and now secondary bacterial  pneumonia  -Initially on Rocephin and doxycycline, but currently transitioned to Zosyn >> cefepime. -Leukocytosis and procalcitonin trending down which is reassuring.  AKI in CKD stage IIIa -Continue continue to trend up, baseline around 1.5, it is up to 5.5 today, oliguric, but he does not appear to be with her and volume overload yet . - felt secondary to ATN due to hypoperfusion in setting of hypotension and ARB use. -Continue with IV fluids, will be very careful given known EF of 25%. -Renal ultrasound significant for bilateral renal cysts. -Avoid nephrotoxic medication, he did not receive any IV contrast. -Losartan has been discontinued -received albumin -low Bicarb level, started on bicarb drip. -Discussed with renal, no indication for HD yet, but it is a possibility.  COVID-19 positive  -Findings on chest x-ray suggest more bacterial pneumonia than COVID-19 of pneumonia, he has no hypoxia, so no indication for steroids, as well elevated LFTs, so cannot use remdesivir. -CT value of 32.6. -will need to be total of 10 days in isolation  Abd pain -He had tender abdomen, with worsening procalcitonin, antibiotics coverage has been broadened . - CT abdomen and pelvis without contrast significant for colonic involvement of left inguinal hernia without obstruction or strangulation, I have discussed with cardiology who reviewed imaging, no signs of obstruction or serrated hernia, actually was assessed by surgical PA who was able to reproduce the hernia at bedside. -Abdominal pain currently resolved -Abdominal pain was most likely in the setting of Foley catheter balloon malposition centrally within the prostate gland, please see discussion below  BPH, traumatic Foley insertion -  Urology input greatly appreciated, CT finding of Foley catheter balloon in the prostate, unable to insert coud Foley catheter, urology consult greatly appreciated, catheter inserted, recommendation to continue Foley  catheter for 2 weeks, and voiding trial after that. -Continue with tamsulosin  Transaminitis -Negative hepatitis panel . -Tylenol level <10 -Ultrasound with no evidence of acute cholecystitis, common bile duct measures 8 mm, mild central intrahepatic bile duct dilation, likely chronic. -LFTs trending down. -Patient will not be able to tolerate MRCP given his encephalopathy.  But I do not think it is needed anymore given LFTs trending down, and CT abdomen with no concerning findings in the hepatobiliary system.    Dementia  /acute metabolic encephalopathy /severe hospital delirium -Patient remains significantly confused, restless. -Required multiple dosing of Ativan initially, unable to use any LidAll due to prolonged QTC . -Have requested family to spend time with the patient, discussed with staff to allow permission for family members to alternate for someone to stay at bedside to minimize risk of fall, to keep patient interactive and to avoid worsening delirium, .  Severe hypophosphatemia -Phosphorus lipids severely low at<1, repleted,  Hypomagnesemia -Repleted  Macrocytosis -Low folic acid level, started on supplements  Chest pain; CAD  - Presents with chest pain at rest, had stable mild troponin elevation, and was seen by cardiology in the ED with low-suspicion for cardiac etiology - He has now developed a leukocytosis and increasing consolidation on CXR and his complaints are likely due to PNA  - Continue ASA, statin, beta-blocker    Chronic systolic CHF - EF was [id number] with severe LVH, diffuse hypokinesis, mild AI, moderate MR, and moderate LA enlargement in 2019  - Appears compensated  - Continue beta-blocker and ARB, monitor volume status    -No evidence of volume overload, continue with judicious IV fluids  Hypertension  -losartan has been discontinued in setting of AKI, blood pressure is soft so metoprolol has been discontinued   Type II DM  - Serum glucose is 214 in  ED; A1c was 6.5% in December 2021  - He is diet-controlled at home  - Monitor CBGs and use low-intensity SSI if needed    DVT prophylaxis: Lovenox Code Status: DNR, confirmed by wife at bedside 7/18 Family Communication: D/W wife and son at bedside Prognosis: Guarded Disposition:   Status is: Inpatient  Remains inpatient appropriate because:IV treatments appropriate due to intensity of illness or inability to take PO  Dispo: The patient is from: Home              Anticipated d/c is to: Home              Patient currently is not medically stable to d/c.  He remains on IV antibiotics, with worsening renal function   Difficult to place patient No       Consultants:  Renal urolgoy  Subjective:  Patient with restlessness, confusion and agitation overnight, he had couple episodes of diarrhea today, he himself denies any abdominal pain, nausea or vomiting, but he remains significantly confused  Objective: Vitals:   07/26/20 0221 07/26/20 0348 07/26/20 0752 07/26/20 1144  BP:  (!) 110/59 (!) 136/51 (!) 132/46  Pulse:  65 72 (!) 59  Resp:  '17 20 18  '$ Temp:  98 F (36.7 C) 98.1 F (36.7 C) 97.8 F (36.6 C)  TempSrc:  Oral Axillary Axillary  SpO2:  100% 94% 95%  Weight: 79 kg       Intake/Output Summary (Last 24 hours) at 07/26/2020  [place] filed at 07/26/2020 1146 Gross per 24 hour  Intake 373.57 ml  Output 790 ml  Net -416.43 ml   Filed Weights   07/24/20 2149 07/25/20 0342 07/26/20 0221  Weight: 78.5 kg 78.3 kg 79 kg    Examination:  Awake, more alert and communicative today, but he remains significantly confused and altered, but in no apparent distress today Symmetrical Chest wall movement, no wheezing today, but diminished air entry at the bases RRR,No Gallops,Rubs or new Murmurs, No Parasternal Heave +ve B.Sounds, Abd Soft, No tenderness, patient has inguinal hernia, nontender, no rebound - guarding or rigidity. No Cyanosis, Clubbing or edema, No new  Rash or bruise     Data Reviewed: I have personally reviewed following labs and imaging studies  CBC: Recent Labs  Lab 07/22/20 1805 07/23/20 0455 07/24/20 0547 07/25/20 0018 07/26/20 0109  WBC 16.2* 19.4* 14.0* 11.5* 8.4  NEUTROABS  --  18.2* 11.7* 9.8* 6.0  HGB 12.7* 11.2* 11.3* 10.4* 9.1*  HCT 37.5* 33.7* 33.8* 31.7* 26.8*  MCV 103.6* 104.3* 104.0* 105.7* 101.1*  PLT 250 247 193 170 142*    Basic Metabolic Panel: Recent Labs  Lab 07/22/20 1805 07/23/20 0455 07/24/20 0547 07/25/20 0018 07/26/20 0109  NA 135 136 138 140 137  K 4.5 4.0 4.3 4.5 3.9  CL 107 110 111 113* 108  CO2 21* 20* 20* 15* 19*  GLUCOSE 214* 144* 95 76 220*  BUN 23 28* 43* 51* 51*  CREATININE 1.26* 1.72* 3.35* 4.47* 5.30*  CALCIUM 8.7* 8.4* 7.9* 7.8* 7.5*  MG  --  1.6* 2.2  --   --   PHOS  --  <1.0* 3.9 4.3 3.1    GFR: Estimated Creatinine Clearance: 10.7 mL/min (A) (by C-G formula based on SCr of 5.3 mg/dL (H)).  Liver Function Tests: Recent Labs  Lab 07/23/20 0455 07/24/20 0547 07/25/20 0018 07/26/20 0109  AST 798* 159* 75* 34  ALT 338* 171* 110* 62*  ALKPHOS 224* 191* 174* 133*  BILITOT 4.3* 2.2* 1.3* 0.8  PROT 5.7* 5.4* 5.9* 5.2*  ALBUMIN 2.6* 2.3* 2.9* 2.6*    CBG: Recent Labs  Lab 07/25/20 1246 07/25/20 1605 07/25/20 2047 07/26/20 0746 07/26/20 1143  GLUCAP 102* 127* 188* 175* 146*     Recent Results (from the past 240 hour(s))  Resp Panel by RT-PCR (Flu A&B, Covid) Nasopharyngeal Swab     Status: Abnormal   Collection Time: 07/22/20  7:43 AM   Specimen: Nasopharyngeal Swab; Nasopharyngeal(NP) swabs in vial transport medium  Result Value Ref Range Status   SARS Coronavirus 2 by RT PCR POSITIVE (A) NEGATIVE Final    Comment: RESULT CALLED TO, READ BACK BY AND VERIFIED WITH: RN L MEEKS [id number] (NOTE) SARS-CoV-2 target nucleic acids are DETECTED.  The SARS-CoV-2 RNA is generally detectable in upper respiratory specimens during the acute phase of  infection. Positive results are indicative of the presence of the identified virus, but do not rule out bacterial infection or co-infection with other pathogens not detected by the test. Clinical correlation with patient history and other diagnostic information is necessary to determine patient infection status. The expected result is Negative.  Fact Sheet for Patients: EntrepreneurPulse.com.au  Fact Sheet for Healthcare Providers: IncredibleEmployment.be  This test is not yet approved or cleared by the [place] FDA and  has been authorized for detection and/or diagnosis of SARS-CoV-2 by FDA under an Emergency Use Authorization (EUA).  This EUA will remain in effect (meaning this test  can be used)  for the duration of  the COVID-19 declaration under Section 564(b)(1) of the Act, 21 U.S.C. section 360bbb-3(b)(1), unless the authorization is terminated or revoked sooner.     Influenza A by PCR NEGATIVE NEGATIVE Final   Influenza B by PCR NEGATIVE NEGATIVE Final    Comment: (NOTE) The Xpert Xpress SARS-CoV-2/FLU/RSV plus assay is intended as an aid in the diagnosis of influenza from Nasopharyngeal swab specimens and should not be used as a sole basis for treatment. Nasal washings and aspirates are unacceptable for Xpert Xpress SARS-CoV-2/FLU/RSV testing.  Fact Sheet for Patients: EntrepreneurPulse.com.au  Fact Sheet for Healthcare Providers: IncredibleEmployment.be  This test is not yet approved or cleared by the [place] FDA and has been authorized for detection and/or diagnosis of SARS-CoV-2 by FDA under an Emergency Use Authorization (EUA). This EUA will remain in effect (meaning this test can be used) for the duration of the COVID-19 declaration under Section 564(b)(1) of the Act, 21 U.S.C. section 360bbb-3(b)(1), unless the authorization is terminated or revoked.  Performed at [facility], [place] [address] [place], [place] [address]          Radiology Studies: No results found.      Scheduled Meds:  aspirin EC  81 mg Oral q morning   Chlorhexidine Gluconate Cloth  6 each Topical Daily   folic acid  1 mg Oral Daily   heparin injection (subcutaneous)  5,000 Units Subcutaneous Q8H   insulin aspart  0-5 Units Subcutaneous QHS   insulin aspart  0-9 Units Subcutaneous TID WC   tamsulosin  0.4 mg Oral QPC supper   Continuous Infusions:  ceFEPime (MAXIPIME) IV     sodium bicarbonate 150 mEq in D5W infusion 100 mL/hr at 07/26/20 1314     LOS: 4 days      Phillips Climes, MD [facility]   To contact the attending provider between 7A-7P or the covering provider during after hours 7P-7A, please log into the web site [email or web address] and access using universal Speed password for that web site. If you do not have the password, please call the hospital operator.  07/26/2020, 2:14 PM

## 2020-07-27 ENCOUNTER — Ambulatory Visit: Payer: Medicare HMO

## 2020-07-27 ENCOUNTER — Ambulatory Visit: Payer: Medicare HMO | Admitting: Internal Medicine

## 2020-07-27 DIAGNOSIS — N179 Acute kidney failure, unspecified: Secondary | ICD-10-CM | POA: Diagnosis not present

## 2020-07-27 DIAGNOSIS — J189 Pneumonia, unspecified organism: Secondary | ICD-10-CM | POA: Diagnosis not present

## 2020-07-27 LAB — CBC WITH DIFFERENTIAL/PLATELET
Abs Immature Granulocytes: 0.04 10*3/uL (ref 0.00–0.07)
Basophils Absolute: 0.1 10*3/uL (ref 0.0–0.1)
Basophils Relative: 1 %
Eosinophils Absolute: 0.6 10*3/uL — ABNORMAL HIGH (ref 0.0–0.5)
Eosinophils Relative: 7 %
HCT: 29 % — ABNORMAL LOW (ref 39.0–52.0)
Hemoglobin: 9.8 g/dL — ABNORMAL LOW (ref 13.0–17.0)
Immature Granulocytes: 1 %
Lymphocytes Relative: 21 %
Lymphs Abs: 1.6 10*3/uL (ref 0.7–4.0)
MCH: 34.3 pg — ABNORMAL HIGH (ref 26.0–34.0)
MCHC: 33.8 g/dL (ref 30.0–36.0)
MCV: 101.4 fL — ABNORMAL HIGH (ref 80.0–100.0)
Monocytes Absolute: 0.9 10*3/uL (ref 0.1–1.0)
Monocytes Relative: 11 %
Neutro Abs: 4.7 10*3/uL (ref 1.7–7.7)
Neutrophils Relative %: 59 %
Platelets: 154 10*3/uL (ref 150–400)
RBC: 2.86 MIL/uL — ABNORMAL LOW (ref 4.22–5.81)
RDW: 13.4 % (ref 11.5–15.5)
WBC: 7.8 10*3/uL (ref 4.0–10.5)
nRBC: 0 % (ref 0.0–0.2)

## 2020-07-27 LAB — COMPREHENSIVE METABOLIC PANEL
ALT: 48 U/L — ABNORMAL HIGH (ref 0–44)
AST: 24 U/L (ref 15–41)
Albumin: 2.4 g/dL — ABNORMAL LOW (ref 3.5–5.0)
Alkaline Phosphatase: 111 U/L (ref 38–126)
Anion gap: 14 (ref 5–15)
BUN: 45 mg/dL — ABNORMAL HIGH (ref 8–23)
CO2: 22 mmol/L (ref 22–32)
Calcium: 7.6 mg/dL — ABNORMAL LOW (ref 8.9–10.3)
Chloride: 102 mmol/L (ref 98–111)
Creatinine, Ser: 5.72 mg/dL — ABNORMAL HIGH (ref 0.61–1.24)
GFR, Estimated: 9 mL/min — ABNORMAL LOW (ref >60)
Glucose, Bld: 110 mg/dL — ABNORMAL HIGH (ref 70–99)
Potassium: 3.5 mmol/L (ref 3.5–5.1)
Sodium: 138 mmol/L (ref 135–145)
Total Bilirubin: 0.8 mg/dL (ref 0.3–1.2)
Total Protein: 5.3 g/dL — ABNORMAL LOW (ref 6.5–8.1)

## 2020-07-27 LAB — GLUCOSE, CAPILLARY
Glucose-Capillary: 111 mg/dL — ABNORMAL HIGH (ref 70–99)
Glucose-Capillary: 193 mg/dL — ABNORMAL HIGH (ref 70–99)
Glucose-Capillary: 135 mg/dL — ABNORMAL HIGH (ref 70–99)
Glucose-Capillary: 163 mg/dL — ABNORMAL HIGH (ref 70–99)
Glucose-Capillary: 135 mg/dL — ABNORMAL HIGH (ref 70–99)

## 2020-07-27 LAB — PROCALCITONIN: Procalcitonin: 5.39 ng/mL

## 2020-07-27 MED ORDER — TRAZODONE HCL 50 MG PO TABS
100.0000 mg | ORAL_TABLET | Freq: Every evening | ORAL | Status: DC | PRN
  Administered 2020-07-27 – 2020-08-05 (×8): 100 mg via ORAL
  Filled 2020-07-27 (×8): qty 2

## 2020-07-27 NOTE — Evaluation (Signed)
Occupational Therapy Evaluation Patient Details Name: Philip Kerr MRN: [id number] DOB: Sep 29, 1933 Today's Date: 07/27/2020    History of Present Illness 87 y.o. male presented 07/22/20 to the emergency department with chest pain. His work-up in ED was significant for transaminitis, pneumonia, as well he was COVID-19 positive, hospital course was complicated by severe delirium, and worsening renal failure.  PMH significant for coronary artery disease with CABG in 1996, ischemic cardiomyopathy, type 2 diabetes mellitus, hypertension, and dementia.   Clinical Impression   PTA patient was living with his spouse in a private residence and was grossly I with ADLs/IADLs without AD. Patient currently requires Min guard to Min A grossly for self-care tasks secondary to deficits including decreased activity tolerance, decreased cognition, and decreased balance. OT will continue to follow acutely. Patient likely to progress well during admission without need for follow-up OT at time of d/c.     Follow Up Recommendations  No OT follow up    Equipment Recommendations  None recommended by OT    Recommendations for Other Services       Precautions / Restrictions Precautions Precautions: Fall Precaution Comments: HOH Restrictions Weight Bearing Restrictions: No      Mobility Bed Mobility Overal bed mobility: Needs Assistance Bed Mobility: Sit to Supine       Sit to supine: Supervision   General bed mobility comments: Supervision A for safety.    Transfers Overall transfer level: Needs assistance Equipment used: None Transfers: Sit to/from Stand Sit to Stand: Min guard         General transfer comment: Increased time to rise; Min guard for steadying/safety.    Balance Overall balance assessment: Needs assistance Sitting-balance support: No upper extremity supported;Feet supported Sitting balance-Leahy Scale: Fair     Standing balance support: Single extremity  supported Standing balance-Leahy Scale: Poor Standing balance comment: Furniture walks                           ADL either performed or assessed with clinical judgement   ADL Overall ADL's : Needs assistance/impaired     Grooming: Min guard Grooming Details (indicate cue type and reason): Min guard for steadying/line management             Lower Body Dressing: Min guard   Toilet Transfer: Min Psychiatric nurse Details (indicate cue type and reason): Simulated with transfer to EOB.         Functional mobility during ADLs: Min guard       Vision Baseline Vision/History: No visual deficits (Patient states that he does not wear glasses but glasses present on bedside table.) Patient Visual Report: No change from baseline       Perception     Praxis      Pertinent Vitals/Pain Pain Assessment: No/denies pain     Hand Dominance     Extremity/Trunk Assessment Upper Extremity Assessment Upper Extremity Assessment: Overall WFL for tasks assessed   Lower Extremity Assessment Lower Extremity Assessment: Overall WFL for tasks assessed   Cervical / Trunk Assessment Cervical / Trunk Assessment: Normal   Communication Communication Communication: HOH   Cognition Arousal/Alertness: Awake/alert Behavior During Therapy: Impulsive Overall Cognitive Status: History of cognitive impairments - at baseline Area of Impairment: Orientation;Safety/judgement;Awareness;Problem solving                 Orientation Level: Place (time NT)       Safety/Judgement: Decreased awareness of safety Awareness: Intellectual Problem Solving: Requires  verbal cues;Requires tactile cues General Comments: Dementia at baseline; decreased safety awareness; impulsivity and decreased insight noted this date; able to recall 0/3 words with 3 min delay on BIMs   General Comments       Exercises     Shoulder Instructions      Home Living Family/patient expects to be  discharged to:: Private residence Living Arrangements: Spouse/significant other Available Help at Discharge: Family;Available 24 hours/day (all live [place] farm together per son') Type of Home: House Home Access: Stairs to enter [employer] of Steps: 5 Entrance Stairs-Rails: Right Home Layout: One level               Home Equipment: None          Prior Functioning/Environment Level of Independence: Independent        Comments: per son, independent with all ADLs (as is his wife)        OT Problem List: Decreased activity tolerance;Impaired balance (sitting and/or standing);Decreased cognition;Decreased safety awareness;Decreased knowledge of use of DME or AE;Cardiopulmonary status limiting activity      OT Treatment/Interventions: Self-care/ADL training;Therapeutic exercise;Energy conservation;DME and/or AE instruction;Therapeutic activities;Cognitive remediation/compensation;Patient/family education;Balance training    OT Goals(Current goals can be found in the care plan section) Acute Rehab OT Goals Patient Stated Goal: go home ASAP OT Goal Formulation: With patient Time For Goal Achievement: 08/10/20 Potential to Achieve Goals: Good ADL Goals Additional ADL Goal #1: Patient will complete ADLs with Mod I in prep for safe d/c home. Additional ADL Goal #2: Patient will recall 3 energy conservation techniques in prep for ADLs/IADLs.  OT Frequency: Min 2X/week   Barriers to D/C:            Co-evaluation              AM-PAC OT "6 Clicks" Daily Activity     Outcome Measure Help from another person eating meals?: None Help from another person taking care of personal grooming?: A Little Help from another person toileting, which includes using toliet, bedpan, or urinal?: A Little Help from another person bathing (including washing, rinsing, drying)?: A Little Help from another person to put on and taking off regular upper body clothing?: A Little Help  from another person to put on and taking off regular lower body clothing?: A Little 6 Click Score: 19   End of Session Nurse Communication: Mobility status  Activity Tolerance: Patient tolerated treatment well Patient left: in bed;with call bell/phone within reach;with bed alarm set  OT Visit Diagnosis: Unsteadiness on feet (R26.81);Muscle weakness (generalized) (M62.81)                Time: [id number] OT Time Calculation (min): 17 min Charges:  OT General Charges $OT Visit: 1 Visit OT Evaluation $OT Eval Low Complexity: 1 Low  Eulon Allnutt H. OTR/L Supplemental OT, Department of rehab services [phone]  Aubriella Perezgarcia R H. 07/27/2020, 2:29 PM

## 2020-07-27 NOTE — Progress Notes (Signed)
Physical Therapy Treatment Patient Details Name: Philip Kerr MRN: [id number] DOB: 09-15-1933 Today's Date: 07/27/2020    History of Present Illness 87 y.o. male presented 07/22/20 to the emergency department with chest pain. His work-up in ED was significant for transaminitis, pneumonia, as well he was COVID-19 positive, hospital course was complicated by severe delirium, and worsening renal failure.  PMH significant for coronary artery disease with CABG in 1996, ischemic cardiomyopathy, type 2 diabetes mellitus, hypertension, and dementia.    PT Comments    Patient received in bed, initially refusing therapy but able to get him to participate with some coaxing and encouragement. Overall able to mobilize at a supervision to min guard level with RW, but very impulsive- when PT had left him in bed to get socks from NT at the door, he tried to climb over the bedrail and needed max intervention to prevent fall. Tolerated gait in room with with RW, VSS on RA. Left in bed with RN present and attending, all needs otherwise met. Will continue to follow.     Follow Up Recommendations  No PT follow up     Equipment Recommendations  Rolling walker with 5" wheels    Recommendations for Other Services       Precautions / Restrictions Precautions Precautions: Fall Precaution Comments: HOH, impulsive, Covid + Restrictions Weight Bearing Restrictions: No    Mobility  Bed Mobility Overal bed mobility: Needs Assistance Bed Mobility: Supine to Sit     Supine to sit: Supervision Sit to supine: Supervision   General bed mobility comments: S for safety, very impulsive and when PT was getting hospital socks from NT at door, he attempted to climb over bed rail and needed max intervention to prevent fall    Transfers Overall transfer level: Needs assistance Equipment used: Rolling walker (2 wheeled) Transfers: Sit to/from Stand Sit to Stand: Min guard         General transfer comment:  Increased time to rise; Min guard for steadying/safety.  Ambulation/Gait Ambulation/Gait assistance: Min guard Gait Distance (Feet): 40 Feet (laps in room) Assistive device: Rolling walker (2 wheeled) Gait Pattern/deviations: Step-through pattern;Trunk flexed Gait velocity: faster than is safe   General Gait Details: impulsively fast but steady with RW, did need max cues for navigation in room and safe use of device; VSS on RA   Stairs             Wheelchair Mobility    Modified Rankin (Stroke Patients Only)       Balance Overall balance assessment: Needs assistance Sitting-balance support: No upper extremity supported;Feet supported Sitting balance-Leahy Scale: Good     Standing balance support: Bilateral upper extremity supported;During functional activity Standing balance-Leahy Scale: Fair Standing balance comment: Furniture walks                            Cognition Arousal/Alertness: Awake/alert Behavior During Therapy: Impulsive Overall Cognitive Status: History of cognitive impairments - at baseline Area of Impairment: Orientation;Safety/judgement;Awareness;Problem solving                 Orientation Level: Place (time NT)       Safety/Judgement: Decreased awareness of safety Awareness: Intellectual Problem Solving: Requires verbal cues;Requires tactile cues General Comments: Dementia at baseline; decreased safety awareness; impulsivity and decreased insight noted this date; able to recall 0/3 words with 3 min delay on BIMs      Exercises      General Comments  Pertinent Vitals/Pain Pain Assessment: No/denies pain    Home Living Family/patient expects to be discharged to:: Private residence Living Arrangements: Spouse/significant other Available Help at Discharge: Family;Available 24 hours/day (all live [place] farm together per son') Type of Home: House Home Access: Stairs to enter Entrance Stairs-Rails: Right Home  Layout: One level Home Equipment: None      Prior Function Level of Independence: Independent      Comments: per son, independent with all ADLs (as is his wife)   PT Goals (current goals can now be found in the care plan section) Acute Rehab PT Goals Patient Stated Goal: go home ASAP PT Goal Formulation: With patient/family Time For Goal Achievement: 08/09/20 Potential to Achieve Goals: Good Progress towards PT goals: Progressing toward goals    Frequency    Min 3X/week      PT Plan Equipment recommendations need to be updated    Co-evaluation              AM-PAC PT "6 Clicks" Mobility   Outcome Measure  Help needed turning from your back to your side while in a flat bed without using bedrails?: None Help needed moving from lying on your back to sitting on the side of a flat bed without using bedrails?: None Help needed moving to and from a bed to a chair (including a wheelchair)?: A Little Help needed standing up from a chair using your arms (e.g., wheelchair or bedside chair)?: A Little Help needed to walk in hospital room?: A Little Help needed climbing 3-5 steps with a railing? : A Little 6 Click Score: 20    End of Session   Activity Tolerance: Patient tolerated treatment well Patient left: in bed;with call bell/phone within reach;with bed alarm set;with family/visitor present Nurse Communication: Mobility status PT Visit Diagnosis: Unsteadiness on feet (R26.81)     Time: 7215-8727 PT Time Calculation (min) (ACUTE ONLY): 16 min  Charges:  $Gait Training: 8-22 mins                    [name], DPT, PN1   Supplemental Physical Therapist [place]    Pager [phone] Acute Rehab Office [phone]

## 2020-07-27 NOTE — Progress Notes (Signed)
Pharmacy Antibiotic Note  Vivika Poythress ARMAGOST is a 87 y.o. male admitted on 07/22/2020 with PNA  Pharmacy has been consulted for cefepime dosing.   Patient changed from zosyn to cefepime. D/W MD, will complete 5 days of therapy on 7/21  Plan: Continue cefepime 1gm IV q24h until 7/21  Weight: 81.3 kg (179 lb 3.7 oz)  Temp (24hrs), Avg:97.5 F (36.4 C), Min:97.2 F (36.2 C), Max:97.6 F (36.4 C)  Recent Labs  Lab 07/23/20 0455 07/24/20 0547 07/25/20 0018 07/26/20 0109 07/27/20 0108  WBC 19.4* 14.0* 11.5* 8.4 7.8  CREATININE 1.72* 3.35* 4.47* 5.30* 5.72*     Estimated Creatinine Clearance: 9.9 mL/min (A) (by C-G formula based on SCr of 5.72 mg/dL (H)).    No Known Allergies  Antimicrobials this admission: Zosyn 7/17>>7/19 Cefepime 7/19>> (7/21) Rocephin 7/15>>7/16 Doxycycline 7/15>>7/17 Metronidazole 7/16>>7/17  Dose adjustments this admission:   Microbiology results: Covid +  Mattis Featherly A. Levada Dy, PharmD, BCPS, FNKF Clinical Pharmacist [place] Please utilize Amion for appropriate phone number to reach the unit pharmacist ([place])  07/27/2020 12:01 PM

## 2020-07-27 NOTE — Plan of Care (Signed)
  Problem: Education: Goal: Knowledge of General Education information will improve Description Including pain rating scale, medication(s)/side effects and non-pharmacologic comfort measures Outcome: Progressing   

## 2020-07-27 NOTE — Progress Notes (Signed)
PROGRESS NOTE    Philip Kerr  [id number] DOB: Mar 15, 1933 DOA: 07/22/2020 PCP: Biagio Borg, MD    Chief Complaint  Patient presents with   Chest Pain    Brief Narrative:    Philip Kerr is a 87 y.o. male with medical history significant for coronary artery disease with CABG in 1996, ischemic cardiomyopathy, type 2 diabetes mellitus, hypertension, and dementia,  presenting to the emergency department with chest pain.  Patient reports a chronic cough that may have been worsening over the past 3 or 4 days but became concerned when he developed severe chest pain while at rest last night , had minimal and stable elevation in troponin, no significant change in EKG, sent home after evaluation by cardiology after returning home, he continued to have worsening in his chest pain, nausea, cough, and return to the emergency department.  Patient and his wife had both been experiencing fever, nausea, and severe fatigue approximately 3 weeks ago and the patient felt that he had almost completely recovered from that before the onset of the current symptoms. -His work-up in ED was significant for transaminitis, pneumonia, as well he was COVID-19 positive, hospital course was complicated by severe delirium, and worsening renal failure.     Assessment & Plan:   Principal Problem:   Pneumonia Active Problems:   Diabetes mellitus type II, non insulin dependent (HCC)   Essential hypertension   Coronary artery disease   Chronic systolic CHF (congestive heart failure) (HCC)   Dementia with behavioral disturbance (HCC)   Depression   Chronic kidney disease, stage 3a (HCC)   Chest pain   Lab test positive for detection of COVID-19 virus   Bacterial pneumonia: - Presents with atypical chest pain and found to have positive COVID-19 pcr, new leukocytosis, and new consolidation on CXR concerning for PNA  - Presentation most consistent with recent COVID-19 infection and now secondary bacterial  pneumonia  -Initially on Rocephin and doxycycline, but currently transitioned to Zosyn >> cefepime.  To finish total of 5 days. -Leukocytosis and procalcitonin trending down which is reassuring.  AKI in CKD stage IIIa -Continue continue to trend up, baseline around 1.5, it is up to 5.75 today, oliguric, but he does not appear to be with her and volume overload yet . - felt secondary to ATN due to hypoperfusion in setting of hypotension and ARB use. -Continue with IV fluids, will be very careful given known EF of 25%. -Renal ultrasound significant for bilateral renal cysts. -Avoid nephrotoxic medication, he did not receive any IV contrast. -Losartan has been discontinued -received albumin -low Bicarb level, started on bicarb drip, bicarb level improving, change IV fluids once bicarb> 25, patient does not appear to be wet. -Discussed with renal, no indication for HD yet, but it is a possibility.  COVID-19 positive  -Findings on chest x-ray suggest more bacterial pneumonia than COVID-19 of pneumonia, he has no hypoxia, so no indication for steroids, as well elevated LFTs, so cannot use remdesivir. -CT value of 32.6. -will need to be total of 10 days in isolation  Abd pain -He had tender abdomen, with worsening procalcitonin, antibiotics coverage has been broadened . - CT abdomen and pelvis without contrast significant for colonic involvement of left inguinal hernia without obstruction or strangulation, I have discussed with cardiology who reviewed imaging, no signs of obstruction or serrated hernia, actually was assessed by surgical PA who was able to reproduce the hernia at bedside. -Abdominal pain currently resolved -Abdominal pain was most  likely in the setting of Foley catheter balloon malposition centrally within the prostate gland, please see discussion below  BPH, traumatic Foley insertion -Urology input greatly appreciated, CT finding of Foley catheter balloon in the prostate, unable  to insert coud Foley catheter, urology consult greatly appreciated, catheter inserted, recommendation to continue Foley catheter for 2 weeks, and voiding trial after that. -Continue with tamsulosin  Transaminitis -Negative hepatitis panel . -Tylenol level <10 -Ultrasound with no evidence of acute cholecystitis, common bile duct measures 8 mm, mild central intrahepatic bile duct dilation, likely chronic. -LFTs trending down. -Patient will not be able to tolerate MRCP given his encephalopathy.  But I do not think it is needed anymore given LFTs trending down, and CT abdomen with no concerning findings in the hepatobiliary system.    Dementia  /acute metabolic encephalopathy /severe hospital delirium -Patient remains significantly confused, restless. -Required multiple dosing of Ativan initially, unable to use any LidAll due to prolonged QTC . -Have requested family to spend time with the patient, discussed with staff to allow permission for family members to alternate for someone to stay at bedside to minimize risk of fall, to keep patient interactive and to avoid worsening delirium, .  Severe hypophosphatemia -Phosphorus lipids severely low at<1, repleted,  Hypomagnesemia -Repleted  Macrocytosis -Low folic acid level, started on supplements  Chest pain; CAD  - Presents with chest pain at rest, had stable mild troponin elevation, and was seen by cardiology in the ED with low-suspicion for cardiac etiology - He has now developed a leukocytosis and increasing consolidation on CXR and his complaints are likely due to PNA  - Continue ASA, statin, beta-blocker    Chronic systolic CHF - EF was [id number] with severe LVH, diffuse hypokinesis, mild AI, moderate MR, and moderate LA enlargement in 2019  - Appears compensated  - Continue beta-blocker and ARB, monitor volume status    -No evidence of volume overload, continue with judicious IV fluids  Hypertension  -losartan has been discontinued  in setting of AKI, blood pressure is soft so metoprolol has been discontinued   Type II DM  - Serum glucose is 214 in ED; A1c was 6.5% in December 2021  - He is diet-controlled at home  - Monitor CBGs and use low-intensity SSI if needed    DVT prophylaxis: Lovenox Code Status: DNR, confirmed by wife at bedside 7/18 Family Communication: D/W wife and  at bedside Prognosis: Guarded Disposition:   Status is: Inpatient  Remains inpatient appropriate because:IV treatments appropriate due to intensity of illness or inability to take PO  Dispo: The patient is from: Home              Anticipated d/c is to: Home              Patient currently is not medically stable to d/c.  He remains on IV antibiotics, with worsening renal function   Difficult to place patient No       Consultants:  Renal urolgoy  Subjective:  Patient remains with significant confusion, agitation overnight, he is with diarrhea, multiple watery bowel movement as discussed with staff.    Objective: Vitals:   07/27/20 0323 07/27/20 0500 07/27/20 0727 07/27/20 1247  BP: 133/66  136/60 136/60  Pulse: 80  86 74  Resp: '19  18 17  '$ Temp: 97.6 F (36.4 C)  (!) 97.2 F (36.2 C) (!) 97.3 F (36.3 C)  TempSrc: Oral  Oral Oral  SpO2: 100%  92% 97%  Weight:  81.3 kg      Intake/Output Summary (Last 24 hours) at 07/27/2020 1523 Last data filed at 07/27/2020 0329 Gross per 24 hour  Intake --  Output 450 ml  Net -450 ml   Filed Weights   07/25/20 0342 07/26/20 0221 07/27/20 0500  Weight: 78.3 kg 79 kg 81.3 kg    Examination:  Awake Alert, confused, follow commands, answering some questions, but impaired cognition and insight, appears to be more awake and conversant today  Symmetrical Chest wall movement, Good air movement bilaterally, CTAB RRR,No Gallops,Rubs or new Murmurs, No Parasternal Heave +ve B.Sounds, Abd Soft, No tenderness, No rebound - guarding or rigidity. No Cyanosis, Clubbing or edema, No new  Rash or bruise     Data Reviewed: I have personally reviewed following labs and imaging studies  CBC: Recent Labs  Lab 07/23/20 0455 07/24/20 0547 07/25/20 0018 07/26/20 0109 07/27/20 0108  WBC 19.4* 14.0* 11.5* 8.4 7.8  NEUTROABS 18.2* 11.7* 9.8* 6.0 4.7  HGB 11.2* 11.3* 10.4* 9.1* 9.8*  HCT 33.7* 33.8* 31.7* 26.8* 29.0*  MCV 104.3* 104.0* 105.7* 101.1* 101.4*  PLT 247 193 170 142* [id number]    Basic Metabolic Panel: Recent Labs  Lab 07/23/20 0455 07/24/20 0547 07/25/20 0018 07/26/20 0109 07/27/20 0108  NA 136 138 140 137 138  K 4.0 4.3 4.5 3.9 3.5  CL 110 111 113* 108 102  CO2 20* 20* 15* 19* 22  GLUCOSE 144* 95 76 220* 110*  BUN 28* 43* 51* 51* 45*  CREATININE 1.72* 3.35* 4.47* 5.30* 5.72*  CALCIUM 8.4* 7.9* 7.8* 7.5* 7.6*  MG 1.6* 2.2  --   --   --   PHOS <1.0* 3.9 4.3 3.1  --     GFR: Estimated Creatinine Clearance: 9.9 mL/min (A) (by C-G formula based on SCr of 5.72 mg/dL (H)).  Liver Function Tests: Recent Labs  Lab 07/23/20 0455 07/24/20 0547 07/25/20 0018 07/26/20 0109 07/27/20 0108  AST 798* 159* 75* 34 24  ALT 338* 171* 110* 62* 48*  ALKPHOS 224* 191* 174* 133* 111  BILITOT 4.3* 2.2* 1.3* 0.8 0.8  PROT 5.7* 5.4* 5.9* 5.2* 5.3*  ALBUMIN 2.6* 2.3* 2.9* 2.6* 2.4*    CBG: Recent Labs  Lab 07/26/20 1143 07/26/20 1613 07/26/20 2116 07/27/20 0730 07/27/20 1250  GLUCAP 146* 167* 143* 111* 193*     Recent Results (from the past 240 hour(s))  Resp Panel by RT-PCR (Flu A&B, Covid) Nasopharyngeal Swab     Status: Abnormal   Collection Time: 07/22/20  7:43 AM   Specimen: Nasopharyngeal Swab; Nasopharyngeal(NP) swabs in vial transport medium  Result Value Ref Range Status   SARS Coronavirus 2 by RT PCR POSITIVE (A) NEGATIVE Final    Comment: RESULT CALLED TO, READ BACK BY AND VERIFIED WITH: RN L MEEKS [id number] (NOTE) SARS-CoV-2 target nucleic acids are DETECTED.  The SARS-CoV-2 RNA is generally detectable in upper respiratory specimens  during the acute phase of infection. Positive results are indicative of the presence of the identified virus, but do not rule out bacterial infection or co-infection with other pathogens not detected by the test. Clinical correlation with patient history and other diagnostic information is necessary to determine patient infection status. The expected result is Negative.  Fact Sheet for Patients: EntrepreneurPulse.com.au  Fact Sheet for Healthcare Providers: IncredibleEmployment.be  This test is not yet approved or cleared by the [place] FDA and  has been authorized for detection and/or diagnosis of SARS-CoV-2 by FDA under an Emergency Use  Authorization (EUA).  This EUA will remain in effect (meaning this test can be used)  for the duration of  the COVID-19 declaration under Section 564(b)(1) of the Act, 21 U.S.C. section 360bbb-3(b)(1), unless the authorization is terminated or revoked sooner.     Influenza A by PCR NEGATIVE NEGATIVE Final   Influenza B by PCR NEGATIVE NEGATIVE Final    Comment: (NOTE) The Xpert Xpress SARS-CoV-2/FLU/RSV plus assay is intended as an aid in the diagnosis of influenza from Nasopharyngeal swab specimens and should not be used as a sole basis for treatment. Nasal washings and aspirates are unacceptable for Xpert Xpress SARS-CoV-2/FLU/RSV testing.  Fact Sheet for Patients: EntrepreneurPulse.com.au  Fact Sheet for Healthcare Providers: IncredibleEmployment.be  This test is not yet approved or cleared by the [place] FDA and has been authorized for detection and/or diagnosis of SARS-CoV-2 by FDA under an Emergency Use Authorization (EUA). This EUA will remain in effect (meaning this test can be used) for the duration of the COVID-19 declaration under Section 564(b)(1) of the Act, 21 U.S.C. section 360bbb-3(b)(1), unless the authorization is terminated  or revoked.  Performed at [facility], [place] [address], [place], [place] [address]          Radiology Studies: No results found.      Scheduled Meds:  aspirin EC  81 mg Oral q morning   Chlorhexidine Gluconate Cloth  6 each Topical Daily   folic acid  1 mg Oral Daily   heparin injection (subcutaneous)  5,000 Units Subcutaneous Q8H   insulin aspart  0-5 Units Subcutaneous QHS   insulin aspart  0-9 Units Subcutaneous TID WC   tamsulosin  0.4 mg Oral QPC supper   Continuous Infusions:  ceFEPime (MAXIPIME) IV 1 g (07/26/20 1828)   sodium bicarbonate 150 mEq in D5W infusion 100 mL/hr at 07/27/20 1340     LOS: 5 days      Phillips Climes, MD [facility]   To contact the attending provider between 7A-7P or the covering provider during after hours 7P-7A, please log into the web site [email or web address] and access using universal [place] password for that web site. If you do not have the password, please call the hospital operator.  07/27/2020, 3:23 PM

## 2020-07-27 NOTE — Progress Notes (Signed)
Subjective:  Pt seen -  family at bedside-  he is making jokes-  again saying that he wants to go home-  wont admit to feeling badly-  UOP stable to increased -  total of 750 over last 24 hours-  hematuria clearing but his BUN and crt are rising again-  BUT rate of rise is lower  Objective Vital signs in last 24 hours: Vitals:   07/27/20 0054 07/27/20 0323 07/27/20 0500 07/27/20 0727  BP: (!) 147/62 133/66  136/60  Pulse: 67 80  86  Resp: (!) '21 19  18  '$ Temp: (!) 97.5 F (36.4 C) 97.6 F (36.4 C)  (!) 97.2 F (36.2 C)  TempSrc: Oral Oral  Oral  SpO2: 96% 100%  92%  Weight:   81.3 kg    Weight change: 2.3 kg  Intake/Output Summary (Last 24 hours) at 07/27/2020 1152 Last data filed at 07/27/2020 0329 Gross per 24 hour  Intake --  Output 450 ml  Net -450 ml    Assessment/ Plan: Pt is a 87 y.o. yo male with CAD, DM, HTN, ICM who was admitted on 07/22/2020 with  symptomatic covid/PNA-  developed AKI when BP dropped on ARB Assessment/Plan: 1. Renal-  AKI in the setting of covid illness, hypotension +/- obstruction -  UOP decreased but  now seems to be picking up-  foley in place.  BUN and crt still trending up but rate of rise is lower and patient does not seem to be uremic and there are no acute indications for HD.  I did warn all in the room that it could be a possibility however not yet.  We are all hoping for a plateau hopefully soon 2. HTN/volume-  not wet-  now that bicarb has dropped  have changed IVF to bicarb-  continue with IVF for now 3. Anemia-  not a significant issue at this time but is dropping  4. Metabolic acidosis-  giving bicarb   Philip Kerr    Labs: Basic Metabolic Panel: Recent Labs  Lab 07/24/20 0547 07/25/20 0018 07/26/20 0109 07/27/20 0108  NA 138 140 137 138  K 4.3 4.5 3.9 3.5  CL 111 113* 108 102  CO2 20* 15* 19* 22  GLUCOSE 95 76 220* 110*  BUN 43* 51* 51* 45*  CREATININE 3.35* 4.47* 5.30* 5.72*  CALCIUM 7.9* 7.8* 7.5* 7.6*  PHOS 3.9  4.3 3.1  --    Liver Function Tests: Recent Labs  Lab 07/25/20 0018 07/26/20 0109 07/27/20 0108  AST 75* 34 24  ALT 110* 62* 48*  ALKPHOS 174* 133* 111  BILITOT 1.3* 0.8 0.8  PROT 5.9* 5.2* 5.3*  ALBUMIN 2.9* 2.6* 2.4*   No results for input(s): LIPASE, AMYLASE in the last 168 hours. Recent Labs  Lab 07/23/20 1410  AMMONIA 29   CBC: Recent Labs  Lab 07/23/20 0455 07/24/20 0547 07/25/20 0018 07/26/20 0109 07/27/20 0108  WBC 19.4* 14.0* 11.5* 8.4 7.8  NEUTROABS 18.2* 11.7* 9.8* 6.0 4.7  HGB 11.2* 11.3* 10.4* 9.1* 9.8*  HCT 33.7* 33.8* 31.7* 26.8* 29.0*  MCV 104.3* 104.0* 105.7* 101.1* 101.4*  PLT 247 193 170 142* 154   Cardiac Enzymes: No results for input(s): CKTOTAL, CKMB, CKMBINDEX, TROPONINI in the last 168 hours. CBG: Recent Labs  Lab 07/26/20 0746 07/26/20 1143 07/26/20 1613 07/26/20 2116 07/27/20 0730  GLUCAP 175* 146* 167* 143* 111*    Iron Studies:  Recent Labs    07/26/20 0109  FERRITIN 132   Studies/Results:  No results found. Medications: Infusions:  ceFEPime (MAXIPIME) IV 1 g (07/26/20 1828)   sodium bicarbonate 150 mEq in D5W infusion 100 mL/hr at 07/26/20 1314    Scheduled Medications:  aspirin EC  81 mg Oral q morning   Chlorhexidine Gluconate Cloth  6 each Topical Daily   folic acid  1 mg Oral Daily   heparin injection (subcutaneous)  5,000 Units Subcutaneous Q8H   insulin aspart  0-5 Units Subcutaneous QHS   insulin aspart  0-9 Units Subcutaneous TID WC   tamsulosin  0.4 mg Oral QPC supper    have reviewed scheduled and prn medications.  Physical Exam: General: NAD Heart: RRR Lungs: mostly clear Abdomen: soft, non tender Extremities: min edema     07/27/2020,11:52 AM  LOS: 5 days

## 2020-07-28 DIAGNOSIS — J189 Pneumonia, unspecified organism: Secondary | ICD-10-CM

## 2020-07-28 LAB — CBC
HCT: 27.9 % — ABNORMAL LOW (ref 39.0–52.0)
Hemoglobin: 9.6 g/dL — ABNORMAL LOW (ref 13.0–17.0)
MCH: 34.8 pg — ABNORMAL HIGH (ref 26.0–34.0)
MCHC: 34.4 g/dL (ref 30.0–36.0)
MCV: 101.1 fL — ABNORMAL HIGH (ref 80.0–100.0)
Platelets: 141 10*3/uL — ABNORMAL LOW (ref 150–400)
RBC: 2.76 MIL/uL — ABNORMAL LOW (ref 4.22–5.81)
RDW: 13.6 % (ref 11.5–15.5)
WBC: 8.3 10*3/uL (ref 4.0–10.5)
nRBC: 0 % (ref 0.0–0.2)

## 2020-07-28 LAB — COMPREHENSIVE METABOLIC PANEL
ALT: 32 U/L (ref 0–44)
AST: 19 U/L (ref 15–41)
Albumin: 2.2 g/dL — ABNORMAL LOW (ref 3.5–5.0)
Alkaline Phosphatase: 99 U/L (ref 38–126)
Anion gap: 14 (ref 5–15)
BUN: 42 mg/dL — ABNORMAL HIGH (ref 8–23)
CO2: 25 mmol/L (ref 22–32)
Calcium: 7.5 mg/dL — ABNORMAL LOW (ref 8.9–10.3)
Chloride: 99 mmol/L (ref 98–111)
Creatinine, Ser: 5.73 mg/dL — ABNORMAL HIGH (ref 0.61–1.24)
GFR, Estimated: 9 mL/min — ABNORMAL LOW (ref >60)
Glucose, Bld: 161 mg/dL — ABNORMAL HIGH (ref 70–99)
Potassium: 3.2 mmol/L — ABNORMAL LOW (ref 3.5–5.1)
Sodium: 138 mmol/L (ref 135–145)
Total Bilirubin: 0.8 mg/dL (ref 0.3–1.2)
Total Protein: 5 g/dL — ABNORMAL LOW (ref 6.5–8.1)

## 2020-07-28 LAB — GLUCOSE, CAPILLARY
Glucose-Capillary: 181 mg/dL — ABNORMAL HIGH (ref 70–99)
Glucose-Capillary: 133 mg/dL — ABNORMAL HIGH (ref 70–99)
Glucose-Capillary: 102 mg/dL — ABNORMAL HIGH (ref 70–99)
Glucose-Capillary: 166 mg/dL — ABNORMAL HIGH (ref 70–99)

## 2020-07-28 MED ORDER — POTASSIUM CHLORIDE CRYS ER 20 MEQ PO TBCR
20.0000 meq | EXTENDED_RELEASE_TABLET | Freq: Once | ORAL | Status: AC
  Administered 2020-07-28: 20 meq via ORAL
  Filled 2020-07-28: qty 1

## 2020-07-28 MED ORDER — POLYETHYLENE GLYCOL 3350 17 G PO PACK: 17.0000 g | PACK | Freq: Every day | ORAL | Status: DC | PRN

## 2020-07-28 MED ORDER — ACETAMINOPHEN 325 MG PO TABS
650.0000 mg | ORAL_TABLET | Freq: Four times a day (QID) | ORAL | Status: DC | PRN
  Administered 2020-07-28 – 2020-08-05 (×10): 650 mg via ORAL
  Filled 2020-07-28 (×10): qty 2

## 2020-07-28 MED ORDER — MELATONIN 3 MG PO TABS
3.0000 mg | ORAL_TABLET | Freq: Once | ORAL | Status: AC | PRN
  Administered 2020-07-29: 3 mg via ORAL
  Filled 2020-07-28: qty 1

## 2020-07-28 MED ORDER — OXYCODONE HCL 5 MG PO TABS
5.0000 mg | ORAL_TABLET | Freq: Four times a day (QID) | ORAL | Status: DC | PRN
Start: 2020-07-28 — End: 2020-08-01
  Administered 2020-07-31 – 2020-08-01 (×2): 5 mg via ORAL
  Filled 2020-07-28 (×3): qty 1

## 2020-07-28 NOTE — Progress Notes (Signed)
PROGRESS NOTE    Philip Kerr  [id number] DOB: 1933-06-03 DOA: 07/22/2020 PCP: Biagio Borg, MD    Chief Complaint  Patient presents with   Chest Pain    Brief Narrative:    Philip Kerr is a 87 y.o. male with medical history significant for coronary artery disease with CABG in 1996, ischemic cardiomyopathy, type 2 diabetes mellitus, hypertension, and dementia,  presenting to the emergency department with chest pain.  Patient reports a chronic cough that may have been worsening over the past 3 or 4 days but became concerned when he developed severe chest pain while at rest last night , had minimal and stable elevation in troponin, no significant change in EKG, sent home after evaluation by cardiology after returning home, he continued to have worsening in his chest pain, nausea, cough, and return to the emergency department.  Patient and his wife had both been experiencing fever, nausea, and severe fatigue approximately 3 weeks ago and the patient felt that he had almost completely recovered from that before the onset of the current symptoms. -His work-up in ED was significant for transaminitis, pneumonia, as well he was COVID-19 positive, hospital course was complicated by severe delirium, and worsening renal failure.  07/28/2020: Seen and examined with his wife at bedside.  Reports flare of his chronic back pain.  He has worsening renal function on his blood work this morning.   Assessment & Plan:   Principal Problem:   Pneumonia Active Problems:   Diabetes mellitus type II, non insulin dependent (HCC)   Essential hypertension   Coronary artery disease   Chronic systolic CHF (congestive heart failure) (HCC)   Dementia with behavioral disturbance (HCC)   Depression   Chronic kidney disease, stage 3a (HCC)   Chest pain   Lab test positive for detection of COVID-19 virus   Bacterial pneumonia: - Presents with atypical chest pain and found to have positive COVID-19 pcr,  new leukocytosis, and new consolidation on CXR concerning for PNA  - Presentation most consistent with recent COVID-19 infection and now secondary bacterial pneumonia  -Initially on Rocephin and doxycycline, but currently transitioned to Zosyn >> cefepime.  To finish total of 5 days. -Leukocytosis and procalcitonin trending down which is reassuring.  Worsening AKI in CKD stage IIIa with oliguria -Continue continue to trend up, baseline around 1.5, it is up to 5.75 today, oliguric - felt secondary to ATN due to hypoperfusion in setting of hypotension and ARB use. -Renal ultrasound significant for bilateral renal cysts. -Avoid nephrotoxic medication, he did not receive any IV contrast. -Losartan has been discontinued -received albumin -Previously low Bicarb level, started on bicarb drip, bicarb level 25.  DC isotonic bicarb drip.   -Discussed with renal, no indication for HD yet, but it is a possibility. -DC IV fluids if able to tolerate a diet.  HFrEF 25% from 2D echo 09/11/2017 Last BNP 734 on 07/22/2020 Repeat BNP x1.  I reviewed chest x-ray done on 07/24/2020 he did not have significant pulmonary edema however with recent IV fluid hydration. Continue strict I's and O's and daily weight  COVID-19 positive  -Findings on chest x-ray suggest more bacterial pneumonia than COVID-19 of pneumonia, he has no hypoxia, so no indication for steroids, as well elevated LFTs, so cannot use remdesivir. -CT value of 32.6. -will need to be total of 10 days in isolation  Resolved abd pain -He had tender abdomen, with worsening procalcitonin, antibiotics coverage has been broadened . - CT abdomen and  pelvis without contrast significant for colonic involvement of left inguinal hernia without obstruction or strangulation, I have discussed with cardiology who reviewed imaging, no signs of obstruction or serrated hernia, actually was assessed by surgical PA who was able to reproduce the hernia at  bedside. -Abdominal pain currently resolved -Abdominal pain was most likely in the setting of Foley catheter balloon malposition centrally within the prostate gland, please see discussion below  BPH, traumatic Foley insertion -Urology input greatly appreciated, CT finding of Foley catheter balloon in the prostate, unable to insert coud Foley catheter, urology consult greatly appreciated, catheter inserted, recommendation to continue Foley catheter for 2 weeks, and voiding trial after that. -Continue with tamsulosin  Transaminitis -Negative hepatitis panel . -Tylenol level <10 -Ultrasound with no evidence of acute cholecystitis, common bile duct measures 8 mm, mild central intrahepatic bile duct dilation, likely chronic. -LFTs trending down. -Patient will not be able to tolerate MRCP given his encephalopathy.  But I do not think it is needed anymore given LFTs trending down, and CT abdomen with no concerning findings in the hepatobiliary system.   Dementia  /acute metabolic encephalopathy /severe hospital delirium -Patient remains significantly confused, restless. -Required multiple dosing of Ativan initially, unable to use any LidAll due to prolonged QTC . -Have requested family to spend time with the patient, discussed with staff to allow permission for family members to alternate for someone to stay at bedside to minimize risk of fall, to keep patient interactive and to avoid worsening delirium  Hypokalemia Repleted judiciously in the setting of renal insufficiency Recheck in the morning  Severe hypophosphatemia -Phosphorus lipids severely low at<1, repleted,  Hypomagnesemia -Repleted  Macrocytosis -Low folic acid level, started on supplements  Chest pain; CAD  - Presents with chest pain at rest, had stable mild troponin elevation, and was seen by cardiology in the ED with low-suspicion for cardiac etiology - He has now developed a leukocytosis and increasing consolidation on  CXR and his complaints are likely due to PNA  - Continue ASA, statin, beta-blocker   Hypertension  -losartan has been discontinued in setting of AKI, blood pressure is soft so metoprolol has been discontinued   Type II DM  - Serum glucose is 214 in ED; A1c was 6.5% in December 2021  - He is diet-controlled at home  - Monitor CBGs and use low-intensity SSI if needed   Chronic back pain Analgesic as needed.   DVT prophylaxis: Lovenox subcu daily Code Status: DNR, confirmed by wife at bedside 7/18 Family Communication: D/W wife and  at bedside Prognosis: Guarded Disposition:   Status is: Inpatient  Remains inpatient appropriate because:IV treatments appropriate due to intensity of illness or inability to take PO  Dispo: The patient is from: Home              Anticipated d/c is to: Home              Patient currently is not medically stable to d/c.  He remains on IV antibiotics, with worsening renal function   Difficult to place patient No       Consultants:  Renal urolgoy    Objective: Vitals:   07/27/20 1957 07/28/20 0053 07/28/20 0419 07/28/20 0720  BP: (!) 112/43 (!) 120/54 (!) 117/51 122/67  Pulse: 72 75 84 88  Resp: '20 20 19 '$ (!) 22  Temp: 98.8 F (37.1 C) 98.5 F (36.9 C) 98.6 F (37 C) 98.5 F (36.9 C)  TempSrc: Axillary Oral Oral Oral  SpO2:  96% 93% 95% 94%  Weight:        Intake/Output Summary (Last 24 hours) at 07/28/2020 [id number] Last data filed at 07/28/2020 0617 Gross per 24 hour  Intake 4156.08 ml  Output 450 ml  Net 3706.08 ml   Filed Weights   07/25/20 0342 07/26/20 0221 07/27/20 0500  Weight: 78.3 kg 79 kg 81.3 kg    Examination:  General: Alert awake interactive.   Respiratory: Mild rales at bases no wheezing noted.  Good inspiratory effort.   Cardiac: Regular rate and rhythm no rubs or gallops.  No JVD or thyromegaly noted.   GI: Nontender normal bowel sounds present.  Nondistended.   Musculoskeletal: No edema, cyanosis or clubbing.    Skin: No rashes noted.   Neuro: Able to move all 4 extremities freely.   Psych mood is appropriate for condition and setting.   Data Reviewed: I have personally reviewed following labs and imaging studies  CBC: Recent Labs  Lab 07/23/20 0455 07/24/20 0547 07/25/20 0018 07/26/20 0109 07/27/20 0108 07/28/20 0502  WBC 19.4* 14.0* 11.5* 8.4 7.8 8.3  NEUTROABS 18.2* 11.7* 9.8* 6.0 4.7  --   HGB 11.2* 11.3* 10.4* 9.1* 9.8* 9.6*  HCT 33.7* 33.8* 31.7* 26.8* 29.0* 27.9*  MCV 104.3* 104.0* 105.7* 101.1* 101.4* 101.1*  PLT 247 193 170 142* 154 141*    Basic Metabolic Panel: Recent Labs  Lab 07/23/20 0455 07/24/20 0547 07/25/20 0018 07/26/20 0109 07/27/20 0108 07/28/20 0502  NA 136 138 140 137 138 138  K 4.0 4.3 4.5 3.9 3.5 3.2*  CL 110 111 113* 108 102 99  CO2 20* 20* 15* 19* 22 25  GLUCOSE 144* 95 76 220* 110* 161*  BUN 28* 43* 51* 51* 45* 42*  CREATININE 1.72* 3.35* 4.47* 5.30* 5.72* 5.73*  CALCIUM 8.4* 7.9* 7.8* 7.5* 7.6* 7.5*  MG 1.6* 2.2  --   --   --   --   PHOS <1.0* 3.9 4.3 3.1  --   --     GFR: Estimated Creatinine Clearance: 9.9 mL/min (A) (by C-G formula based on SCr of 5.73 mg/dL (H)).  Liver Function Tests: Recent Labs  Lab 07/24/20 0547 07/25/20 0018 07/26/20 0109 07/27/20 0108 07/28/20 0502  AST 159* 75* 34 24 19  ALT 171* 110* 62* 48* 32  ALKPHOS 191* 174* 133* 111 99  BILITOT 2.2* 1.3* 0.8 0.8 0.8  PROT 5.4* 5.9* 5.2* 5.3* 5.0*  ALBUMIN 2.3* 2.9* 2.6* 2.4* 2.2*    CBG: Recent Labs  Lab 07/27/20 1250 07/27/20 1656 07/27/20 2013 07/27/20 2146 07/28/20 0718  GLUCAP 193* 135* 163* 135* 181*     Recent Results (from the past 240 hour(s))  Resp Panel by RT-PCR (Flu A&B, Covid) Nasopharyngeal Swab     Status: Abnormal   Collection Time: 07/22/20  7:43 AM   Specimen: Nasopharyngeal Swab; Nasopharyngeal(NP) swabs in vial transport medium  Result Value Ref Range Status   SARS Coronavirus 2 by RT PCR POSITIVE (A) NEGATIVE Final     Comment: RESULT CALLED TO, READ BACK BY AND VERIFIED WITH: RN L MEEKS [id number] (NOTE) SARS-CoV-2 target nucleic acids are DETECTED.  The SARS-CoV-2 RNA is generally detectable in upper respiratory specimens during the acute phase of infection. Positive results are indicative of the presence of the identified virus, but do not rule out bacterial infection or co-infection with other pathogens not detected by the test. Clinical correlation with patient history and other diagnostic information is necessary to determine patient infection status.  The expected result is Negative.  Fact Sheet for Patients: EntrepreneurPulse.com.au  Fact Sheet for Healthcare Providers: IncredibleEmployment.be  This test is not yet approved or cleared by the [place] FDA and  has been authorized for detection and/or diagnosis of SARS-CoV-2 by FDA under an Emergency Use Authorization (EUA).  This EUA will remain in effect (meaning this test can be used)  for the duration of  the COVID-19 declaration under Section 564(b)(1) of the Act, 21 U.S.C. section 360bbb-3(b)(1), unless the authorization is terminated or revoked sooner.     Influenza A by PCR NEGATIVE NEGATIVE Final   Influenza B by PCR NEGATIVE NEGATIVE Final    Comment: (NOTE) The Xpert Xpress SARS-CoV-2/FLU/RSV plus assay is intended as an aid in the diagnosis of influenza from Nasopharyngeal swab specimens and should not be used as a sole basis for treatment. Nasal washings and aspirates are unacceptable for Xpert Xpress SARS-CoV-2/FLU/RSV testing.  Fact Sheet for Patients: EntrepreneurPulse.com.au  Fact Sheet for Healthcare Providers: IncredibleEmployment.be  This test is not yet approved or cleared by the [place] FDA and has been authorized for detection and/or diagnosis of SARS-CoV-2 by FDA under an Emergency Use Authorization (EUA). This EUA will  remain in effect (meaning this test can be used) for the duration of the COVID-19 declaration under Section 564(b)(1) of the Act, 21 U.S.C. section 360bbb-3(b)(1), unless the authorization is terminated or revoked.  Performed at [facility], [place] [address], [place], [place] [address]          Radiology Studies: No results found.      Scheduled Meds:  aspirin EC  81 mg Oral q morning   Chlorhexidine Gluconate Cloth  6 each Topical Daily   folic acid  1 mg Oral Daily   heparin injection (subcutaneous)  5,000 Units Subcutaneous Q8H   insulin aspart  0-5 Units Subcutaneous QHS   insulin aspart  0-9 Units Subcutaneous TID WC   tamsulosin  0.4 mg Oral QPC supper   Continuous Infusions:  ceFEPime (MAXIPIME) IV 1 g (07/27/20 1758)   sodium bicarbonate 150 mEq in D5W infusion 100 mL/hr at 07/28/20 0617     LOS: 6 days      Kayleen Memos, MD [facility]   To contact the attending provider between 7A-7P or the covering provider during after hours 7P-7A, please log into the web site [email or web address] and access using universal [place] password for that web site. If you do not have the password, please call the hospital operator.  07/28/2020, 8:22 AM

## 2020-07-28 NOTE — Progress Notes (Signed)
Subjective:  Pt seen -  family at bedside-  he is making jokes-    wont admit to feeling badly-  UOP stable to increased -  at least 450 over last 24 hours-  BUN and crt are stable- did not go up today !  Objective Vital signs in last 24 hours: Vitals:   07/27/20 1957 07/28/20 0053 07/28/20 0419 07/28/20 0720  BP: (!) 112/43 (!) 120/54 (!) 117/51 122/67  Pulse: 72 75 84 88  Resp: '20 20 19 '$ (!) 22  Temp: 98.8 F (37.1 C) 98.5 F (36.9 C) 98.6 F (37 C) 98.5 F (36.9 C)  TempSrc: Axillary Oral Oral Oral  SpO2: 96% 93% 95% 94%  Weight:       Weight change:   Intake/Output Summary (Last 24 hours) at 07/28/2020 1022 Last data filed at 07/28/2020 0617 Gross per 24 hour  Intake 4156.08 ml  Output 450 ml  Net 3706.08 ml    Assessment/ Plan: Pt is a 87 y.o. yo male with CAD, DM, HTN, ICM who was admitted on 07/22/2020 with  symptomatic covid/PNA-  developed AKI when BP dropped on ARB Assessment/Plan: 1. Renal-  AKI in the setting of covid illness, hypotension +/- obstruction -  UOP decreased but  now seems to be picking up-  foley in place.  BUN and crt stable for the first time ! does not seem to be uremic and there are no acute indications for HD.  I did warn all in the room that it could be a possibility however not yet.  We are maybe seeing a plateau today-  next step would be to see improvement-  if crt trends down for 2 days in a row I would feel comfortable with discharge. He will need to keep foley in however and follow up op with urology as sounds like he has had LUTS for many months 2. HTN/volume-  not wet-  now that bicarb has dropped  have changed IVF to bicarb-  ok to stop IVF 3. Anemia-  not a significant issue at this time but is dropping  4. Metabolic acidosis-  improved with  bicarb   Philip Kerr    Labs: Basic Metabolic Panel: Recent Labs  Lab 07/24/20 0547 07/25/20 0018 07/26/20 0109 07/27/20 0108 07/28/20 0502  NA 138 140 137 138 138  K 4.3 4.5 3.9 3.5  3.2*  CL 111 113* 108 102 99  CO2 20* 15* 19* 22 25  GLUCOSE 95 76 220* 110* 161*  BUN 43* 51* 51* 45* 42*  CREATININE 3.35* 4.47* 5.30* 5.72* 5.73*  CALCIUM 7.9* 7.8* 7.5* 7.6* 7.5*  PHOS 3.9 4.3 3.1  --   --    Liver Function Tests: Recent Labs  Lab 07/26/20 0109 07/27/20 0108 07/28/20 0502  AST 34 24 19  ALT 62* 48* 32  ALKPHOS 133* 111 99  BILITOT 0.8 0.8 0.8  PROT 5.2* 5.3* 5.0*  ALBUMIN 2.6* 2.4* 2.2*   No results for input(s): LIPASE, AMYLASE in the last 168 hours. Recent Labs  Lab 07/23/20 1410  AMMONIA 29   CBC: Recent Labs  Lab 07/24/20 0547 07/25/20 0018 07/26/20 0109 07/27/20 0108 07/28/20 0502  WBC 14.0* 11.5* 8.4 7.8 8.3  NEUTROABS 11.7* 9.8* 6.0 4.7  --   HGB 11.3* 10.4* 9.1* 9.8* 9.6*  HCT 33.8* 31.7* 26.8* 29.0* 27.9*  MCV 104.0* 105.7* 101.1* 101.4* 101.1*  PLT 193 170 142* 154 141*   Cardiac Enzymes: No results for input(s): CKTOTAL, CKMB, CKMBINDEX,  TROPONINI in the last 168 hours. CBG: Recent Labs  Lab 07/27/20 1250 07/27/20 1656 07/27/20 2013 07/27/20 2146 07/28/20 0718  GLUCAP 193* 135* 163* 135* 181*    Iron Studies:  Recent Labs    07/26/20 0109  FERRITIN 132   Studies/Results: No results found. Medications: Infusions:  ceFEPime (MAXIPIME) IV 1 g (07/27/20 1758)    Scheduled Medications:  aspirin EC  81 mg Oral q morning   Chlorhexidine Gluconate Cloth  6 each Topical Daily   folic acid  1 mg Oral Daily   heparin injection (subcutaneous)  5,000 Units Subcutaneous Q8H   insulin aspart  0-5 Units Subcutaneous QHS   insulin aspart  0-9 Units Subcutaneous TID WC   potassium chloride  20 mEq Oral Once   tamsulosin  0.4 mg Oral QPC supper    have reviewed scheduled and prn medications.  Physical Exam: General: NAD Heart: RRR Lungs: mostly clear Abdomen: soft, non tender Extremities: min edema     07/28/2020,10:22 AM  LOS: 6 days

## 2020-07-29 ENCOUNTER — Other Ambulatory Visit (HOSPITAL_COMMUNITY): Payer: Medicare HMO

## 2020-07-29 DIAGNOSIS — J189 Pneumonia, unspecified organism: Secondary | ICD-10-CM | POA: Diagnosis not present

## 2020-07-29 LAB — RENAL FUNCTION PANEL
Albumin: 2.3 g/dL — ABNORMAL LOW (ref 3.5–5.0)
Anion gap: 10 (ref 5–15)
BUN: 41 mg/dL — ABNORMAL HIGH (ref 8–23)
CO2: 27 mmol/L (ref 22–32)
Calcium: 7.6 mg/dL — ABNORMAL LOW (ref 8.9–10.3)
Chloride: 98 mmol/L (ref 98–111)
Creatinine, Ser: 5.79 mg/dL — ABNORMAL HIGH (ref 0.61–1.24)
GFR, Estimated: 9 mL/min — ABNORMAL LOW (ref >60)
Glucose, Bld: 128 mg/dL — ABNORMAL HIGH (ref 70–99)
Phosphorus: 2.9 mg/dL (ref 2.5–4.6)
Potassium: 3.2 mmol/L — ABNORMAL LOW (ref 3.5–5.1)
Sodium: 135 mmol/L (ref 135–145)

## 2020-07-29 LAB — GLUCOSE, CAPILLARY
Glucose-Capillary: 139 mg/dL — ABNORMAL HIGH (ref 70–99)
Glucose-Capillary: 120 mg/dL — ABNORMAL HIGH (ref 70–99)
Glucose-Capillary: 116 mg/dL — ABNORMAL HIGH (ref 70–99)
Glucose-Capillary: 130 mg/dL — ABNORMAL HIGH (ref 70–99)

## 2020-07-29 MED ORDER — POTASSIUM CHLORIDE CRYS ER 20 MEQ PO TBCR
40.0000 meq | EXTENDED_RELEASE_TABLET | Freq: Once | ORAL | Status: AC
  Administered 2020-07-29: 40 meq via ORAL
  Filled 2020-07-29: qty 2

## 2020-07-29 NOTE — Progress Notes (Signed)
Subjective:  Pt seen -  no c/o's -  UOP stable to increased -  at least 750 over last 24 hours-  BUN and crt are stable still - did not go up today !   Objective Vital signs in last 24 hours: Vitals:   07/29/20 0023 07/29/20 0434 07/29/20 0451 07/29/20 0808  BP: (!) 116/57 138/60  (!) 141/65  Pulse: 72 96  87  Resp: '18 19  20  '$ Temp: 98 F (36.7 C) 98.5 F (36.9 C)  97.8 F (36.6 C)  TempSrc: Axillary Axillary  Oral  SpO2: 92% 93%  95%  Weight:   81.4 kg    Weight change:   Intake/Output Summary (Last 24 hours) at 07/29/2020 1058 Last data filed at 07/29/2020 0436 Gross per 24 hour  Intake 120 ml  Output 750 ml  Net -630 ml    Assessment/ Plan: Pt is a 87 y.o. yo male with CAD, DM, HTN, ICM who was admitted on 07/22/2020 with  symptomatic covid/PNA-  developed AKI when BP dropped on ARB Assessment/Plan: 1. Renal-  AKI in the setting of covid illness, hypotension +/- obstruction -  UOP decreased but  now  picking up-  foley in place.  BUN and crt stable for the last 48 hours, at a plateau- does not seem to be uremic and there are no acute indications for HD.  I did warn all in the room that it could be a possibility however not yet.  We are seeing a plateau -  next step would be to see improvement-  if crt trends down for 2 days in a row I would feel comfortable with discharge. He will need to keep foley in however and follow up op with urology as sounds like he has had LUTS for many months 2. HTN/volume-  not wet-  now that bicarb has dropped  have changed IVF to bicarb-  now stopped IVF 3. Anemia-  not a significant issue at this time but is dropping  4. Metabolic acidosis-  improved with  bicarb   [name]    Labs: Basic Metabolic Panel: Recent Labs  Lab 07/25/20 0018 07/26/20 0109 07/27/20 0108 07/28/20 0502 07/29/20 0046  NA 140 137 138 138 135  K 4.5 3.9 3.5 3.2* 3.2*  CL 113* 108 102 99 98  CO2 15* 19* '22 25 27  '$ GLUCOSE 76 220* 110* 161* 128*  BUN  51* 51* 45* 42* 41*  CREATININE 4.47* 5.30* 5.72* 5.73* 5.79*  CALCIUM 7.8* 7.5* 7.6* 7.5* 7.6*  PHOS 4.3 3.1  --   --  2.9   Liver Function Tests: Recent Labs  Lab 07/26/20 0109 07/27/20 0108 07/28/20 0502 07/29/20 0046  AST 34 24 19  --   ALT 62* 48* 32  --   ALKPHOS 133* 111 99  --   BILITOT 0.8 0.8 0.8  --   PROT 5.2* 5.3* 5.0*  --   ALBUMIN 2.6* 2.4* 2.2* 2.3*   No results for input(s): LIPASE, AMYLASE in the last 168 hours. Recent Labs  Lab 07/23/20 1410  AMMONIA 29   CBC: Recent Labs  Lab 07/24/20 0547 07/25/20 0018 07/26/20 0109 07/27/20 0108 07/28/20 0502  WBC 14.0* 11.5* 8.4 7.8 8.3  NEUTROABS 11.7* 9.8* 6.0 4.7  --   HGB 11.3* 10.4* 9.1* 9.8* 9.6*  HCT 33.8* 31.7* 26.8* 29.0* 27.9*  MCV 104.0* 105.7* 101.1* 101.4* 101.1*  PLT 193 170 142* 154 141*   Cardiac Enzymes: No results for input(s):  CKTOTAL, CKMB, CKMBINDEX, TROPONINI in the last 168 hours. CBG: Recent Labs  Lab 07/28/20 0718 07/28/20 1145 07/28/20 1608 07/28/20 2103 07/29/20 0806  GLUCAP 181* 133* 102* 166* 139*    Iron Studies:  No results for input(s): IRON, TIBC, TRANSFERRIN, FERRITIN in the last 72 hours.  Studies/Results: No results found. Medications: Infusions:    Scheduled Medications:  aspirin EC  81 mg Oral q morning   Chlorhexidine Gluconate Cloth  6 each Topical Daily   folic acid  1 mg Oral Daily   heparin injection (subcutaneous)  5,000 Units Subcutaneous Q8H   insulin aspart  0-5 Units Subcutaneous QHS   insulin aspart  0-9 Units Subcutaneous TID WC   tamsulosin  0.4 mg Oral QPC supper    have reviewed scheduled and prn medications.  Physical Exam: General: NAD-  more accepting of staying in house  Heart: RRR Lungs: mostly clear Abdomen: soft, non tender Extremities: min edema     07/29/2020,10:58 AM  LOS: 7 days

## 2020-07-29 NOTE — Progress Notes (Signed)
PROGRESS NOTE    Philip Kerr  [id number] DOB: [birth year] DOA: 07/22/2020 PCP: Biagio Borg, MD    Chief Complaint  Patient presents with   Chest Pain    Brief Narrative:    Philip Kerr is a 87 y.o. male with medical history significant for coronary artery disease with CABG in 1996, ischemic cardiomyopathy, type 2 diabetes mellitus, hypertension, and dementia,  presenting to the emergency department with chest pain.  Patient reports a chronic cough that may have been worsening over the past 3 or 4 days but became concerned when he developed severe chest pain while at rest last night , had minimal and stable elevation in troponin, no significant change in EKG, sent home after evaluation by cardiology after returning home, he continued to have worsening in his chest pain, nausea, cough, and return to the emergency department.  Patient and his wife had both been experiencing fever, nausea, and severe fatigue approximately 3 weeks ago and the patient felt that he had almost completely recovered from that before the onset of the current symptoms. -His work-up in ED was significant for transaminitis, pneumonia, as well he was COVID-19 positive, hospital course was complicated by severe delirium, and worsening renal failure.  07/29/2020: Seen and examined at his bedside.  His wife was present.  He has no new complaints however his wife reports that he is very weak.  Assessment & Plan:   Principal Problem:   Pneumonia Active Problems:   Diabetes mellitus type II, non insulin dependent (HCC)   Essential hypertension   Coronary artery disease   Chronic systolic CHF (congestive heart failure) (HCC)   Dementia with behavioral disturbance (HCC)   Depression   Chronic kidney disease, stage 3a (HCC)   Chest pain   Lab test positive for detection of COVID-19 virus   Bacterial pneumonia: - Presents with atypical chest pain and found to have positive COVID-19 pcr, new leukocytosis, and  new consolidation on CXR concerning for PNA  - Presentation most consistent with recent COVID-19 infection and now secondary bacterial pneumonia  -Initially on Rocephin and doxycycline, but currently transitioned to Zosyn >> cefepime.  To finish total of 5 days. -Leukocytosis and procalcitonin trending down which is reassuring.  Worsening AKI in CKD stage IIIa with oliguria -Continue continue to trend up, baseline around 1.5, it is up to 5.79 today from 5.75, oliguric - felt secondary to ATN due to hypoperfusion in setting of hypotension and ARB use. -Renal ultrasound significant for bilateral renal cysts. -Avoid nephrotoxic medication, he did not receive any IV contrast. -Losartan has been discontinued -received albumin and bicarb drip. -Discussed with renal, no indication for HD yet, but it is a possibility.  HFrEF 25% from 2D echo 09/11/2017 Last BNP 734 on 07/22/2020 I reviewed chest x-ray done on 07/24/2020 he did not have significant pulmonary edema however with recent IV fluid hydration. Continue strict I's and O's and daily weight Obtain 2D echo.  COVID-19 positive  -Findings on chest x-ray suggest more bacterial pneumonia than COVID-19 of pneumonia, he has no hypoxia, so no indication for steroids, as well elevated LFTs, so cannot use remdesivir. -will need to be total of 10 days in isolation  Resolved abd pain -He had tender abdomen, with worsening procalcitonin, antibiotics coverage has been broadened . - CT abdomen and pelvis without contrast significant for colonic involvement of left inguinal hernia without obstruction or strangulation, I have discussed with cardiology who reviewed imaging, no signs of obstruction or serrated hernia,  actually was assessed by surgical PA who was able to reproduce the hernia at bedside. -Abdominal pain currently resolved -Abdominal pain was most likely in the setting of Foley catheter balloon malposition centrally within the prostate gland, please  see discussion below  BPH, traumatic Foley insertion -Urology input greatly appreciated, CT finding of Foley catheter balloon in the prostate, unable to insert coud Foley catheter, urology consult greatly appreciated, catheter inserted, recommendation to continue Foley catheter for 2 weeks, and voiding trial after that. -Continue with tamsulosin  Transaminitis -Negative hepatitis panel . -Tylenol level <10 -Ultrasound with no evidence of acute cholecystitis, common bile duct measures 8 mm, mild central intrahepatic bile duct dilation, likely chronic. -LFTs trending down. -Patient will not be able to tolerate MRCP given his encephalopathy.  But I do not think it is needed anymore given LFTs trending down, and CT abdomen with no concerning findings in the hepatobiliary system.   Acute metabolic encephalopathy, improving. Reorient as needed. -Required multiple dosing of Ativan initially, unable to use any LidAll due to prolonged QTC . -Have requested family to spend time with the patient, discussed with staff to allow permission for family members to alternate for someone to stay at bedside to minimize risk of fall, to keep patient interactive and to avoid worsening delirium  Hypokalemia Repleted judiciously in the setting of renal insufficiency Recheck in the morning  Severe hypophosphatemia -Phosphorus lipids severely low at<1, repleted,  Hypomagnesemia -Repleted  Macrocytosis -Low folic acid level, started on supplements  Chest pain; CAD  - Presents with chest pain at rest, had stable mild troponin elevation, and was seen by cardiology in the ED with low-suspicion for cardiac etiology - He has now developed a leukocytosis and increasing consolidation on CXR and his complaints are likely due to PNA  - Continue ASA, statin, beta-blocker  -Follow 2D echo ordered on 07/29/2020.  Hypertension  BP is currently at goal 136/72. -losartan has been discontinued in setting of AKI, blood  pressure is soft so metoprolol has been discontinued   Type II DM, currently well controlled. - Serum glucose is 214 in ED; A1c was 6.5% in December 2021  Hemoglobin A1c 6.4 on 07/23/2020. - He is diet-controlled at home  - Monitor CBGs and use low-intensity SSI if needed   Chronic back pain Analgesic as needed.   DVT prophylaxis: Lovenox subcu daily Code Status: DNR, confirmed by wife at bedside 7/18 Family Communication: Discussed plan of care with his wife at bedside.  She understands and agrees with the plan. Prognosis: Guarded Disposition:   Status is: Inpatient  Remains inpatient appropriate because:IV treatments appropriate due to intensity of illness or inability to take PO  Dispo: The patient is from: Home              Anticipated d/c is to: Home              Patient currently is not medically stable to d/c.  He remains on IV antibiotics, with worsening renal function   Difficult to place patient No       Consultants:  Renal urolgoy    Objective: Vitals:   07/29/20 0434 07/29/20 0451 07/29/20 0808 07/29/20 1215  BP: 138/60  (!) 141/65 130/74  Pulse: 96  87 85  Resp: '19  20 20  '$ Temp: 98.5 F (36.9 C)  97.8 F (36.6 C) 98.7 F (37.1 C)  TempSrc: Axillary  Oral Oral  SpO2: 93%  95% 95%  Weight:  81.4 kg  Intake/Output Summary (Last 24 hours) at 07/29/2020 1530 Last data filed at 07/29/2020 1227 Gross per 24 hour  Intake 280 ml  Output 1200 ml  Net -920 ml   Filed Weights   07/26/20 0221 07/27/20 0500 07/29/20 0451  Weight: 79 kg 81.3 kg 81.4 kg    Examination:  General: Alert awake and interactive.  Not in acute distress. Respiratory: Clear to auscultation no wheezes or rales.  Good inspiratory effort. Cardiac: Regular rate and rhythm no rubs gallops.  No JVD aortomegaly noted. GI: Nondistended nontender with palpation bowel sounds present.   Musculoskeletal: No lower extremity edema bilaterally. Skin: No rashes noted.   Neuro: He moves  all 4 limbs freely. Psych: His mood is appropriate for condition and setting.  Data Reviewed: I have personally reviewed following labs and imaging studies  CBC: Recent Labs  Lab 07/23/20 0455 07/24/20 0547 07/25/20 0018 07/26/20 0109 07/27/20 0108 07/28/20 0502  WBC 19.4* 14.0* 11.5* 8.4 7.8 8.3  NEUTROABS 18.2* 11.7* 9.8* 6.0 4.7  --   HGB 11.2* 11.3* 10.4* 9.1* 9.8* 9.6*  HCT 33.7* 33.8* 31.7* 26.8* 29.0* 27.9*  MCV 104.3* 104.0* 105.7* 101.1* 101.4* 101.1*  PLT 247 193 170 142* 154 141*    Basic Metabolic Panel: Recent Labs  Lab 07/23/20 0455 07/24/20 0547 07/25/20 0018 07/26/20 0109 07/27/20 0108 07/28/20 0502 07/29/20 0046  NA 136 138 140 137 138 138 135  K 4.0 4.3 4.5 3.9 3.5 3.2* 3.2*  CL 110 111 113* 108 102 99 98  CO2 20* 20* 15* 19* '22 25 27  '$ GLUCOSE 144* 95 76 220* 110* 161* 128*  BUN 28* 43* 51* 51* 45* 42* 41*  CREATININE 1.72* 3.35* 4.47* 5.30* 5.72* 5.73* 5.79*  CALCIUM 8.4* 7.9* 7.8* 7.5* 7.6* 7.5* 7.6*  MG 1.6* 2.2  --   --   --   --   --   PHOS <1.0* 3.9 4.3 3.1  --   --  2.9    GFR: Estimated Creatinine Clearance: 9.8 mL/min (A) (by C-G formula based on SCr of 5.79 mg/dL (H)).  Liver Function Tests: Recent Labs  Lab 07/24/20 0547 07/25/20 0018 07/26/20 0109 07/27/20 0108 07/28/20 0502 07/29/20 0046  AST 159* 75* 34 24 19  --   ALT 171* 110* 62* 48* 32  --   ALKPHOS 191* 174* 133* 111 99  --   BILITOT 2.2* 1.3* 0.8 0.8 0.8  --   PROT 5.4* 5.9* 5.2* 5.3* 5.0*  --   ALBUMIN 2.3* 2.9* 2.6* 2.4* 2.2* 2.3*    CBG: Recent Labs  Lab 07/28/20 1145 07/28/20 1608 07/28/20 2103 07/29/20 0806 07/29/20 1221  GLUCAP 133* 102* 166* 139* 120*     Recent Results (from the past 240 hour(s))  Resp Panel by RT-PCR (Flu A&B, Covid) Nasopharyngeal Swab     Status: Abnormal   Collection Time: 07/22/20  7:43 AM   Specimen: Nasopharyngeal Swab; Nasopharyngeal(NP) swabs in vial transport medium  Result Value Ref Range Status   SARS Coronavirus  2 by RT PCR POSITIVE (A) NEGATIVE Final    Comment: RESULT CALLED TO, READ BACK BY AND VERIFIED WITH: RN L MEEKS [id number] (NOTE) SARS-CoV-2 target nucleic acids are DETECTED.  The SARS-CoV-2 RNA is generally detectable in upper respiratory specimens during the acute phase of infection. Positive results are indicative of the presence of the identified virus, but do not rule out bacterial infection or co-infection with other pathogens not detected by the test. Clinical correlation with patient  history and other diagnostic information is necessary to determine patient infection status. The expected result is Negative.  Fact Sheet for Patients: EntrepreneurPulse.com.au  Fact Sheet for Healthcare Providers: IncredibleEmployment.be  This test is not yet approved or cleared by the [place] FDA and  has been authorized for detection and/or diagnosis of SARS-CoV-2 by FDA under an Emergency Use Authorization (EUA).  This EUA will remain in effect (meaning this test can be used)  for the duration of  the COVID-19 declaration under Section 564(b)(1) of the Act, 21 U.S.C. section 360bbb-3(b)(1), unless the authorization is terminated or revoked sooner.     Influenza A by PCR NEGATIVE NEGATIVE Final   Influenza B by PCR NEGATIVE NEGATIVE Final    Comment: (NOTE) The Xpert Xpress SARS-CoV-2/FLU/RSV plus assay is intended as an aid in the diagnosis of influenza from Nasopharyngeal swab specimens and should not be used as a sole basis for treatment. Nasal washings and aspirates are unacceptable for Xpert Xpress SARS-CoV-2/FLU/RSV testing.  Fact Sheet for Patients: EntrepreneurPulse.com.au  Fact Sheet for Healthcare Providers: IncredibleEmployment.be  This test is not yet approved or cleared by the [place] FDA and has been authorized for detection and/or diagnosis of SARS-CoV-2 by FDA under an  Emergency Use Authorization (EUA). This EUA will remain in effect (meaning this test can be used) for the duration of the COVID-19 declaration under Section 564(b)(1) of the Act, 21 U.S.C. section 360bbb-3(b)(1), unless the authorization is terminated or revoked.  Performed at [facility], [place] [address], [place], [place] [address]          Radiology Studies: No results found.      Scheduled Meds:  aspirin EC  81 mg Oral q morning   Chlorhexidine Gluconate Cloth  6 each Topical Daily   folic acid  1 mg Oral Daily   heparin injection (subcutaneous)  5,000 Units Subcutaneous Q8H   insulin aspart  0-5 Units Subcutaneous QHS   insulin aspart  0-9 Units Subcutaneous TID WC   tamsulosin  0.4 mg Oral QPC supper   Continuous Infusions:     LOS: 7 days      Kayleen Memos, MD [facility]   To contact the attending provider between 7A-7P or the covering provider during after hours 7P-7A, please log into the web site [email or web address] and access using universal [place] password for that web site. If you do not have the password, please call the hospital operator.  07/29/2020, 3:30 PM

## 2020-07-29 NOTE — Progress Notes (Signed)
Physical Therapy Treatment Patient Details Name: Philip Kerr MRN: [id number] DOB: 02/13/1933 Today's Date: 07/29/2020    History of Present Illness 87 y.o. male presented 07/22/20 to the emergency department with chest pain. His work-up in ED was significant for transaminitis, pneumonia, as well he was COVID-19 positive, hospital course was complicated by severe delirium, and worsening renal failure.  PMH significant for coronary artery disease with CABG in 1996, ischemic cardiomyopathy, type 2 diabetes mellitus, hypertension, and dementia.    PT Comments    Patient received in bed, son present during session and assisted in getting patient to participate in session. Did need Min to [facility] for bed mobility due to impulsivity and general weakness, otherwise able to mobilize on a min guard basis with and without RW. Tends to be very unsafe with RW so attempted to gait train with no device, but he was insistent on using walker today. VSS on RA. Very impulsive and needs constant redirection and supervision, otherwise at high risk of fall or dislodging catheter. Left in bed with all needs met, bed alarm active, and son present. Will continue efforts.     Follow Up Recommendations  No PT follow up     Equipment Recommendations  Rolling walker with 5" wheels    Recommendations for Other Services       Precautions / Restrictions Precautions Precautions: Fall Precaution Comments: HOH, impulsive, Covid + Restrictions Weight Bearing Restrictions: No    Mobility  Bed Mobility Overal bed mobility: Needs Assistance Bed Mobility: Supine to Sit     Supine to sit: Min assist;Mod assist Sit to supine: Supervision   General bed mobility comments: Min A hand held assist to elevate trunk on first attempt coming to EOB. Pt lying back down waiting on socks and requiring mod A to elevate trunk.    Transfers Overall transfer level: Needs assistance Equipment used: Rolling walker (2  wheeled) Transfers: Sit to/from Stand Sit to Stand: Min guard         General transfer comment: Increased time to rise; Min guard for steadying/safety.  Ambulation/Gait Ambulation/Gait assistance: Min guard Gait Distance (Feet): 100 Feet Assistive device: Rolling walker (2 wheeled) Gait Pattern/deviations: Step-through pattern;Trunk flexed Gait velocity: faster than is safe   General Gait Details: very impulsive and unsafe with RW- attempted gait without it but he self-selected it and insisted on using device. Needed cues for safety as well as cues for proximity to RW and keeping his body inside of RW instead of off to the side   Stairs             Wheelchair Mobility    Modified Rankin (Stroke Patients Only)       Balance Overall balance assessment: Needs assistance Sitting-balance support: No upper extremity supported;Feet supported Sitting balance-Leahy Scale: Good Sitting balance - Comments: donning socks sittinge EOB with min guard A for safety.   Standing balance support: No upper extremity supported;Bilateral upper extremity supported;During functional activity Standing balance-Leahy Scale: Fair Standing balance comment: Furniture walks when not using RW                            Cognition Arousal/Alertness: Awake/alert Behavior During Therapy: Impulsive Overall Cognitive Status: History of cognitive impairments - at baseline Area of Impairment: Safety/judgement;Awareness;Problem solving;Following commands                       Following Commands: Follows one step commands consistently;Follows  one step commands with increased time Safety/Judgement: Decreased awareness of safety Awareness: Intellectual Problem Solving: Slow processing;Requires verbal cues;Requires tactile cues General Comments: Dementia at baseline; decreased safety awareness requiring verbal cues with use of RW. Pt picking up RW to turn in room, and continuing to  pick up RW with turns despite cues to keep RW on floor. Pt continues to be impulsive, attempting to stand while waiting on staff to bring socks. Pt with decreased STM forgetting he was going to don socks prior to mobility.      Exercises      General Comments General comments (skin integrity, edema, etc.): son present throughout session      Pertinent Vitals/Pain Pain Assessment: No/denies pain    Home Living                      Prior Function            PT Goals (current goals can now be found in the care plan section) Acute Rehab PT Goals Patient Stated Goal: go home ASAP PT Goal Formulation: With patient/family Time For Goal Achievement: 08/09/20 Potential to Achieve Goals: Good Progress towards PT goals: Progressing toward goals    Frequency    Min 3X/week      PT Plan Current plan remains appropriate    Co-evaluation   Reason for Co-Treatment: For patient/therapist safety;Necessary to address cognition/behavior during functional activity PT goals addressed during session: Mobility/safety with mobility;Balance;Proper use of DME OT goals addressed during session: ADL's and self-care;Proper use of Adaptive equipment and DME      AM-PAC PT "6 Clicks" Mobility   Outcome Measure  Help needed turning from your back to your side while in a flat bed without using bedrails?: None Help needed moving from lying on your back to sitting on the side of a flat bed without using bedrails?: None Help needed moving to and from a bed to a chair (including a wheelchair)?: A Little Help needed standing up from a chair using your arms (e.g., wheelchair or bedside chair)?: A Little Help needed to walk in hospital room?: A Little Help needed climbing 3-5 steps with a railing? : A Little 6 Click Score: 20    End of Session Equipment Utilized During Treatment: Gait belt Activity Tolerance: Patient tolerated treatment well Patient left: in bed;with call bell/phone within  reach;with bed alarm set;with family/visitor present Nurse Communication: Mobility status PT Visit Diagnosis: Unsteadiness on feet (R26.81)     Time: 3888-2800 PT Time Calculation (min) (ACUTE ONLY): 24 min  Charges:  $Gait Training: 8-22 mins (co-tx with OT)                    [name], DPT, PN1   Supplemental Physical Therapist [place]    Pager [phone] Acute Rehab Office [phone]

## 2020-07-29 NOTE — Progress Notes (Signed)
Occupational Therapy Treatment Patient Details Name: Philip Kerr MRN: [id number] DOB: 05/26/1933 Today's Date: 07/29/2020    History of present illness 87 y.o. male presented 07/22/20 to the emergency department with chest pain. His work-up in ED was significant for transaminitis, pneumonia, as well he was COVID-19 positive, hospital course was complicated by severe delirium, and worsening renal failure.  PMH significant for coronary artery disease with CABG in 1996, ischemic cardiomyopathy, type 2 diabetes mellitus, hypertension, and dementia.   OT comments  Pt progressing towards established OT goals. Session performed in collaboration with PT to address activity tolerance. Pt performing LB dressing with Min Guard A for safety. Pt requiring Min Guard A for functional mobility within room. Pt continues to present with decreased activity tolerance, safety, awareness, and STM. Recommend discharge home with no follow up OT. Will continue to follow acutely as admitted.    Follow Up Recommendations  No OT follow up    Equipment Recommendations  None recommended by OT    Recommendations for Other Services      Precautions / Restrictions Precautions Precautions: Fall Precaution Comments: HOH, impulsive, Covid + Restrictions Weight Bearing Restrictions: No       Mobility Bed Mobility Overal bed mobility: Needs Assistance Bed Mobility: Supine to Sit     Supine to sit: Min assist;Mod assist     General bed mobility comments: Min A hand held assist to elevate trunk on first attempt coming to EOB. Pt lying back down waiting on socks and requiring mod A to elevate trunk.    Transfers Overall transfer level: Needs assistance Equipment used: Rolling walker (2 wheeled) Transfers: Sit to/from Stand Sit to Stand: Min guard         General transfer comment: Increased time to rise; Min guard for steadying/safety.    Balance Overall balance assessment: Needs  assistance Sitting-balance support: No upper extremity supported;Feet supported Sitting balance-Leahy Scale: Good Sitting balance - Comments: donning socks sittinge EOB with min guard A for safety.   Standing balance support: No upper extremity supported;Bilateral upper extremity supported;During functional activity Standing balance-Leahy Scale: Fair Standing balance comment: Furniture walks when not using RW                           ADL either performed or assessed with clinical judgement   ADL Overall ADL's : Needs assistance/impaired                     Lower Body Dressing: Min guard;Sitting/lateral leans Lower Body Dressing Details (indicate cue type and reason): Pt attempting to don socks lying in bed. Pt requiring verbal cues to don socks sitting EOB. Min Guard for safety with donning socks.             Functional mobility during ADLs: Min guard;Rolling walker General ADL Comments: Pt performing functional mobility with and without RW with min Guard A for safety. Pt requiring verbal cues for safety thougout session and cues to slow down.     Vision       Perception     Praxis      Cognition Arousal/Alertness: Awake/alert Behavior During Therapy: Impulsive Overall Cognitive Status: History of cognitive impairments - at baseline Area of Impairment: Safety/judgement;Awareness;Problem solving;Following commands                       Following Commands: Follows one step commands consistently;Follows one step commands with increased time Safety/Judgement:  Decreased awareness of safety Awareness: Intellectual Problem Solving: Slow processing;Requires verbal cues;Requires tactile cues General Comments: Dementia at baseline; decreased safety awareness requiring verbal cues with use of RW. Pt picking up RW to turn in room, and continuing to pick up RW with turns despite cues to keep RW on floor. Pt continues to be impulsive, attempting to stand  while waiting on staff to bring socks. Pt with decreased STM forgetting he was going to don socks prior to mobility.        Exercises     Shoulder Instructions       General Comments Pt son present throughout session.    Pertinent Vitals/ Pain       Pain Assessment: No/denies pain  Home Living                                          Prior Functioning/Environment              Frequency  Min 2X/week        Progress Toward Goals  OT Goals(current goals can now be found in the care plan section)  Progress towards OT goals: Progressing toward goals  Acute Rehab OT Goals Patient Stated Goal: go home ASAP OT Goal Formulation: With patient Time For Goal Achievement: 08/10/20 Potential to Achieve Goals: Good ADL Goals Additional ADL Goal #1: Patient will complete ADLs with Mod I in prep for safe d/c home. Additional ADL Goal #2: Patient will recall 3 energy conservation techniques in prep for ADLs/IADLs.  Plan Discharge plan remains appropriate    Co-evaluation    PT/OT/SLP Co-Evaluation/Treatment: Yes Reason for Co-Treatment: For patient/therapist safety;Necessary to address cognition/behavior during functional activity PT goals addressed during session: Mobility/safety with mobility;Balance;Proper use of DME OT goals addressed during session: ADL's and self-care;Proper use of Adaptive equipment and DME      AM-PAC OT "6 Clicks" Daily Activity     Outcome Measure   Help from another person eating meals?: None Help from another person taking care of personal grooming?: A Little Help from another person toileting, which includes using toliet, bedpan, or urinal?: A Little Help from another person bathing (including washing, rinsing, drying)?: A Little Help from another person to put on and taking off regular upper body clothing?: A Little Help from another person to put on and taking off regular lower body clothing?: A Little 6 Click Score:  19    End of Session Equipment Utilized During Treatment: Rolling walker  OT Visit Diagnosis: Unsteadiness on feet (R26.81);Muscle weakness (generalized) (M62.81);Other symptoms and signs involving cognitive function   Activity Tolerance Patient tolerated treatment well   Patient Left in bed;with call bell/phone within reach;with bed alarm set;with family/visitor present   Nurse Communication Mobility status        Time: [id number] OT Time Calculation (min): 34 min  Charges: OT General Charges $OT Visit: 1 Visit OT Treatments $Self Care/Home Management : 8-22 mins  Shanda Howells, OTDS    Shanda Howells 07/29/2020, 2:38 PM

## 2020-07-30 DIAGNOSIS — J189 Pneumonia, unspecified organism: Secondary | ICD-10-CM | POA: Diagnosis not present

## 2020-07-30 LAB — GLUCOSE, CAPILLARY
Glucose-Capillary: 105 mg/dL — ABNORMAL HIGH (ref 70–99)
Glucose-Capillary: 130 mg/dL — ABNORMAL HIGH (ref 70–99)
Glucose-Capillary: 130 mg/dL — ABNORMAL HIGH (ref 70–99)
Glucose-Capillary: 127 mg/dL — ABNORMAL HIGH (ref 70–99)

## 2020-07-30 LAB — RENAL FUNCTION PANEL
Albumin: 2.2 g/dL — ABNORMAL LOW (ref 3.5–5.0)
Anion gap: 10 (ref 5–15)
BUN: 42 mg/dL — ABNORMAL HIGH (ref 8–23)
CO2: 24 mmol/L (ref 22–32)
Calcium: 7.6 mg/dL — ABNORMAL LOW (ref 8.9–10.3)
Chloride: 99 mmol/L (ref 98–111)
Creatinine, Ser: 5.72 mg/dL — ABNORMAL HIGH (ref 0.61–1.24)
GFR, Estimated: 9 mL/min — ABNORMAL LOW (ref >60)
Glucose, Bld: 110 mg/dL — ABNORMAL HIGH (ref 70–99)
Phosphorus: 3.1 mg/dL (ref 2.5–4.6)
Potassium: 4.4 mmol/L (ref 3.5–5.1)
Sodium: 133 mmol/L — ABNORMAL LOW (ref 135–145)

## 2020-07-30 LAB — POTASSIUM: Potassium: 4 mmol/L (ref 3.5–5.1)

## 2020-07-30 LAB — MAGNESIUM: Magnesium: 1.6 mg/dL — ABNORMAL LOW (ref 1.7–2.4)

## 2020-07-30 MED ORDER — NEPRO/CARBSTEADY PO LIQD
237.0000 mL | ORAL | Status: DC
  Administered 2020-07-30 – 2020-08-08 (×11): 237 mL via ORAL

## 2020-07-30 MED ORDER — MAGNESIUM OXIDE -MG SUPPLEMENT 400 (240 MG) MG PO TABS
200.0000 mg | ORAL_TABLET | Freq: Once | ORAL | Status: AC
  Administered 2020-07-30: 200 mg via ORAL
  Filled 2020-07-30: qty 1

## 2020-07-30 MED ORDER — MAGNESIUM SULFATE 2 GM/50ML IV SOLN
2.0000 g | Freq: Once | INTRAVENOUS | Status: AC
  Administered 2020-07-30: 2 g via INTRAVENOUS
  Filled 2020-07-30: qty 50

## 2020-07-30 NOTE — Progress Notes (Signed)
Pt remains alert & verbal, no c/o pain. Assisted oob & sit up in chair for a couple hours & tolerated well.Son at bedside.

## 2020-07-30 NOTE — Progress Notes (Signed)
Subjective:  Pt seen -  no c/o's -  UOP stable to increased -  at least 1000 over last 24 hours-  BUN and crt are stable still -   he does not seem uremic but difficult to tell because he wont be serious  Objective Vital signs in last 24 hours: Vitals:   07/30/20 0003 07/30/20 0413 07/30/20 0600 07/30/20 0754  BP: (!) 151/69 (!) 126/93  138/77  Pulse: 72 71  65  Resp: '19 20  18  '$ Temp: 98.3 F (36.8 C) 98.2 F (36.8 C)  98.2 F (36.8 C)  TempSrc: Axillary Axillary  Oral  SpO2: 95% 93%  94%  Weight:   82.6 kg    Weight change: 1.2 kg  Intake/Output Summary (Last 24 hours) at 07/30/2020 0815 Last data filed at 07/30/2020 0422 Gross per 24 hour  Intake 160 ml  Output 1000 ml  Net -840 ml    Assessment/ Plan: Pt is a 87 y.o. yo male with CAD, DM, HTN, ICM who was admitted on 07/22/2020 with  symptomatic covid/PNA-  developed AKI when BP dropped on ARB Assessment/Plan: 1. Renal-  Baseline crt 1.3- 1.5.  AKI in the setting of covid illness, hypotension +/- obstruction -  UOP decreased initially but now good-  foley in place.  BUN and crt stable for the last 72 hours, at a plateau- does not seem to be uremic and there are no acute indications for HD.    We are seeing a plateau -  next step would be to see improvement but has not happened yet-  I will modify my recommendations-  if crt at least stays stable tomorrow I would feel comfortable with discharge. He will need to keep foley in however and follow up op with urology as sounds like he has had LUTS for many months-  he will need follow up with nephrology and urology at discharge 2. HTN/volume-  not wet-   stopped IVF 3. Anemia-  not a significant issue at this time but is dropping  4. Metabolic acidosis-  improved with  bicarb   So if crt not worse tomorrow  I am ok with discharge-  I will arrange follow up with nephrology   Surprise: Basic Metabolic Panel: Recent Labs  Lab 07/26/20 0109 07/27/20 0108  07/28/20 0502 07/29/20 0046 07/30/20 0046 07/30/20 0432  NA 137   < > 138 135 133*  --   K 3.9   < > 3.2* 3.2* 4.4 4.0  CL 108   < > 99 98 99  --   CO2 19*   < > '25 27 24  '$ --   GLUCOSE 220*   < > 161* 128* 110*  --   BUN 51*   < > 42* 41* 42*  --   CREATININE 5.30*   < > 5.73* 5.79* 5.72*  --   CALCIUM 7.5*   < > 7.5* 7.6* 7.6*  --   PHOS 3.1  --   --  2.9 3.1  --    < > = values in this interval not displayed.   Liver Function Tests: Recent Labs  Lab 07/26/20 0109 07/27/20 0108 07/28/20 0502 07/29/20 0046 07/30/20 0046  AST 34 24 19  --   --   ALT 62* 48* 32  --   --   ALKPHOS 133* 111 99  --   --   BILITOT 0.8 0.8 0.8  --   --  PROT 5.2* 5.3* 5.0*  --   --   ALBUMIN 2.6* 2.4* 2.2* 2.3* 2.2*   No results for input(s): LIPASE, AMYLASE in the last 168 hours. Recent Labs  Lab 07/23/20 1410  AMMONIA 29   CBC: Recent Labs  Lab 07/24/20 0547 07/25/20 0018 07/26/20 0109 07/27/20 0108 07/28/20 0502  WBC 14.0* 11.5* 8.4 7.8 8.3  NEUTROABS 11.7* 9.8* 6.0 4.7  --   HGB 11.3* 10.4* 9.1* 9.8* 9.6*  HCT 33.8* 31.7* 26.8* 29.0* 27.9*  MCV 104.0* 105.7* 101.1* 101.4* 101.1*  PLT 193 170 142* 154 141*   Cardiac Enzymes: No results for input(s): CKTOTAL, CKMB, CKMBINDEX, TROPONINI in the last 168 hours. CBG: Recent Labs  Lab 07/28/20 2103 07/29/20 0806 07/29/20 1221 07/29/20 1652 07/29/20 2028  GLUCAP 166* 139* 120* 116* 130*    Iron Studies:  No results for input(s): IRON, TIBC, TRANSFERRIN, FERRITIN in the last 72 hours.  Studies/Results: No results found. Medications: Infusions:    Scheduled Medications:  aspirin EC  81 mg Oral q morning   Chlorhexidine Gluconate Cloth  6 each Topical Daily   folic acid  1 mg Oral Daily   heparin injection (subcutaneous)  5,000 Units Subcutaneous Q8H   insulin aspart  0-5 Units Subcutaneous QHS   insulin aspart  0-9 Units Subcutaneous TID WC   tamsulosin  0.4 mg Oral QPC supper    have reviewed scheduled and prn  medications.  Physical Exam: General: NAD-  joking  Heart: RRR Lungs: mostly clear Abdomen: soft, non tender Extremities: min edema     07/30/2020,8:15 AM  LOS: 8 days

## 2020-07-30 NOTE — Progress Notes (Signed)
PROGRESS NOTE    Philip Kerr  [id number] DOB: 1933-11-30 DOA: 07/22/2020 PCP: Biagio Borg, MD    Chief Complaint  Patient presents with   Chest Pain    Brief Narrative:    Philip Kerr is a 87 y.o. male with medical history significant for coronary artery disease with CABG in 1996, ischemic cardiomyopathy, type 2 diabetes mellitus, hypertension, and dementia, presenting to the emergency department with chest pain.  Patient reports a chronic cough that may have been worsening over the past 3 or 4 days but became concerned when he developed severe chest pain while at rest last night, had minimal and stable elevation in troponin, no significant change in EKG, sent home from the ED after evaluation by cardiology, returned to the ED due to worsening chest pain, generalized weakness, and cough.  Patient and his wife had both been experiencing fever, nausea, and severe fatigue approximately 3 weeks ago and the patient felt that he had almost completely recovered from that before the onset of the current symptoms. -His work-up in ED was significant for transaminitis, pneumonia, as well a positive COVID-19 screening test.  Hospital course was complicated by severe delirium, and worsening renal failure.  His delirium has resolved.  His renal function has plateaued with creatinine of 5.72 from baseline of 1.5.  07/30/2020: Patient was seen and examined with his son at his bedside.  He has no new complaints he wants to go home.  He was seen by nephrology.  Recommended follow-up with nephrology and urology if creatinine is not worse tomorrow.  Patient is nonoliguric.  Assessment & Plan:   Principal Problem:   Pneumonia Active Problems:   Diabetes mellitus type II, non insulin dependent (HCC)   Essential hypertension   Coronary artery disease   Chronic systolic CHF (congestive heart failure) (HCC)   Dementia with behavioral disturbance (HCC)   Depression   Chronic kidney disease, stage  3a (HCC)   Chest pain   Lab test positive for detection of COVID-19 virus   Left lower lobe pneumonia, POA: - Presents with atypical chest pain and found to have positive COVID-19 pcr, new leukocytosis, and new consolidation on CXR concerning for PNA  - Presentation most consistent with recent COVID-19 infection and now secondary bacterial pneumonia  Procalcitonin peaked at 20 and downtrending, 5.39 on 07/27/2020. -Initially on Rocephin and doxycycline, then transitioned to Zosyn >> cefepime.  Completed total of 5 days. Currently afebrile with no leukocytosis.   O2 saturation 96-98 % on room air  Nonoliguric AKI in CKD stage IIIa  -Continue continue to trend up, baseline around 1.5, it is up to 5.79 today from 5.75, oliguric - felt secondary to ATN due to hypoperfusion in setting of hypotension and ARB use. -Renal ultrasound significant for bilateral renal cysts. -Continue to avoid nephrotoxic medication, he did not receive any IV contrast. -Losartan has been discontinued -received albumin and bicarb drip. -Discussed with renal, no indication for HD at this time, but it is a possibility. 1 L urine output in the last 24 hours. Continue to closely monitor urine output Keep Foley catheter in place, will have voiding trial outpatient at urology's office. Repeat renal panel in the morning.  HFrEF 25% from 2D echo 09/11/2017 Last BNP 734 on 07/22/2020 Continue strict I's and O's and daily weight Obtain 2D echo, pending.  COVID-19 positive  -Findings on chest x-ray suggest more bacterial pneumonia than COVID-19 viral pneumonia, he has no hypoxia, so no indication for steroids He  initially had elevated LFTs, so could not use IV remdesivir. -will need to be total of 10 days in isolation  Resolved abd pain -He had tender abdomen, with worsening procalcitonin, antibiotics coverage was broadened . - CT abdomen and pelvis without contrast significant for colonic involvement of left inguinal  hernia without obstruction or strangulation, I have discussed with cardiology who reviewed imaging, no signs of obstruction or serrated hernia, actually was assessed by surgical PA who was able to reproduce the hernia at bedside. -Abdominal pain currently resolved -Abdominal pain was most likely in the setting of Foley catheter balloon malposition centrally within the prostate gland, please see discussion below  BPH, traumatic Foley insertion -Urology input greatly appreciated, CT finding of Foley catheter balloon in the prostate, unable to insert coud Foley catheter, urology consult greatly appreciated, catheter inserted, recommendation to continue Foley catheter for 2 weeks, and voiding trial after that, likely in the urologist office. -Continue with tamsulosin Continue to monitor urine output.  Resolved elevated liver chemistries. -Negative hepatitis panel . -Tylenol level <10 -Ultrasound with no evidence of acute cholecystitis, common bile duct measures 8 mm, mild central intrahepatic bile duct dilation, likely chronic. -LFTs have normalized, alkaline phosphatase, AST, ALT, T bilirubin.   Resolved acute metabolic encephalopathy Reorient as needed. -Required multiple dosing of Ativan initially, unable to use any LidAll due to prolonged QTC . -Have requested family to spend time with the patient, discussed with staff to allow permission for family members to alternate for someone to stay at bedside to minimize risk of fall, to keep patient interactive and to avoid worsening delirium  Resolved post repletion: Hypokalemia Repleted judiciously in the setting of renal insufficiency  Resolved severe hypophosphatemia -Phosphorus lipids severely low at<1, repleted Repeated phosphorus level 3.1.  Hypomagnesemia Serum magnesium 1.6 Repleted orally Repeat level  Macrocytosis -Low folic acid level, started on supplements  Resolved chest pain; CAD  - Presents with chest pain at rest, had  stable mild troponin elevation, and was seen by cardiology in the ED with low-suspicion for cardiac etiology - He had leukocytosis and increasing consolidation on left-sided CXR and his complaints could be due to PNA  - Continue ASA, statin, beta-blocker  -Follow 2D echo ordered on 07/29/2020.  Hypertension  BP is currently at goal 136/72. -losartan has been discontinued in setting of AKI, blood pressure is soft so metoprolol has been held.   Type II DM, currently well controlled. - Serum glucose is 214 in ED; A1c was 6.5% in December 2021  Hemoglobin A1c 6.4 on 07/23/2020. - He is diet-controlled at home  - Monitor CBGs and use low-intensity SSI if needed   Chronic back pain Analgesic as needed.   DVT prophylaxis: Lovenox subcu daily Code Status: DNR, confirmed by wife at bedside 7/18 Family Communication: Discussed plan of care with his son at bedside on 07/30/2020.  Discussed plan of care with his wife at bedside on 07/29/2020.    Prognosis: Guarded Disposition:   Status is: Inpatient  Remains inpatient appropriate because:IV treatments appropriate due to intensity of illness or inability to take PO  Dispo: The patient is from: Home              Anticipated d/c is to: Home likely on 07/31/2020 or when nephrology signs off.              Patient currently is not medically stable to d/c.  He remains on IV antibiotics, with worsening renal function   Difficult to place patient No  Consultants:  Renal urolgoy    Objective: Vitals:   07/30/20 0413 07/30/20 0600 07/30/20 0754 07/30/20 1241  BP: (!) 126/93  138/77 128/68  Pulse: 71  65 84  Resp: '20  18 18  '$ Temp: 98.2 F (36.8 C)  98.2 F (36.8 C) 97.7 F (36.5 C)  TempSrc: Axillary  Oral Oral  SpO2: 93%  94% 92%  Weight:  82.6 kg      Intake/Output Summary (Last 24 hours) at 07/30/2020 1521 Last data filed at 07/30/2020 0422 Gross per 24 hour  Intake --  Output 550 ml  Net -550 ml   Filed Weights    07/27/20 0500 07/29/20 0451 07/30/20 0600  Weight: 81.3 kg 81.4 kg 82.6 kg    Examination:  General: Pleasant in no acute distress.  He is alert and interactive. Respiratory: Clear to auscultation no wheezes or rales.   Cardiac: Regular rate and rhythm no rubs or gallops.  No JVD or thyromegaly noted.  GI: Nontender minimally distended with bowel sounds present. Musculoskeletal: No lower extremity edema bilaterally. Skin: No rashes noted.   Neuro: He moves all 4 limbs freely. Psych: His mood is appropriate for condition and setting.  Data Reviewed: I have personally reviewed following labs and imaging studies  CBC: Recent Labs  Lab 07/24/20 0547 07/25/20 0018 07/26/20 0109 07/27/20 0108 07/28/20 0502  WBC 14.0* 11.5* 8.4 7.8 8.3  NEUTROABS 11.7* 9.8* 6.0 4.7  --   HGB 11.3* 10.4* 9.1* 9.8* 9.6*  HCT 33.8* 31.7* 26.8* 29.0* 27.9*  MCV 104.0* 105.7* 101.1* 101.4* 101.1*  PLT 193 170 142* 154 141*    Basic Metabolic Panel: Recent Labs  Lab 07/24/20 0547 07/25/20 0018 07/26/20 0109 07/27/20 0108 07/28/20 0502 07/29/20 0046 07/30/20 0046 07/30/20 0432  NA 138 140 137 138 138 135 133*  --   K 4.3 4.5 3.9 3.5 3.2* 3.2* 4.4 4.0  CL 111 113* 108 102 99 98 99  --   CO2 20* 15* 19* '22 25 27 24  '$ --   GLUCOSE 95 76 220* 110* 161* 128* 110*  --   BUN 43* 51* 51* 45* 42* 41* 42*  --   CREATININE 3.35* 4.47* 5.30* 5.72* 5.73* 5.79* 5.72*  --   CALCIUM 7.9* 7.8* 7.5* 7.6* 7.5* 7.6* 7.6*  --   MG 2.2  --   --   --   --   --   --  1.6*  PHOS 3.9 4.3 3.1  --   --  2.9 3.1  --     GFR: Estimated Creatinine Clearance: 9.9 mL/min (A) (by C-G formula based on SCr of 5.72 mg/dL (H)).  Liver Function Tests: Recent Labs  Lab 07/24/20 0547 07/25/20 0018 07/26/20 0109 07/27/20 0108 07/28/20 0502 07/29/20 0046 07/30/20 0046  AST 159* 75* 34 24 19  --   --   ALT 171* 110* 62* 48* 32  --   --   ALKPHOS 191* 174* 133* 111 99  --   --   BILITOT 2.2* 1.3* 0.8 0.8 0.8  --   --    PROT 5.4* 5.9* 5.2* 5.3* 5.0*  --   --   ALBUMIN 2.3* 2.9* 2.6* 2.4* 2.2* 2.3* 2.2*    CBG: Recent Labs  Lab 07/29/20 1221 07/29/20 1652 07/29/20 2028 07/30/20 0815 07/30/20 1243  GLUCAP 120* 116* 130* 105* 130*     Recent Results (from the past 240 hour(s))  Resp Panel by RT-PCR (Flu A&B, Covid) Nasopharyngeal Swab  Status: Abnormal   Collection Time: 07/22/20  7:43 AM   Specimen: Nasopharyngeal Swab; Nasopharyngeal(NP) swabs in vial transport medium  Result Value Ref Range Status   SARS Coronavirus 2 by RT PCR POSITIVE (A) NEGATIVE Final    Comment: RESULT CALLED TO, READ BACK BY AND VERIFIED WITH: RN L MEEKS [id number] (NOTE) SARS-CoV-2 target nucleic acids are DETECTED.  The SARS-CoV-2 RNA is generally detectable in upper respiratory specimens during the acute phase of infection. Positive results are indicative of the presence of the identified virus, but do not rule out bacterial infection or co-infection with other pathogens not detected by the test. Clinical correlation with patient history and other diagnostic information is necessary to determine patient infection status. The expected result is Negative.  Fact Sheet for Patients: EntrepreneurPulse.com.au  Fact Sheet for Healthcare Providers: IncredibleEmployment.be  This test is not yet approved or cleared by the [place] FDA and  has been authorized for detection and/or diagnosis of SARS-CoV-2 by FDA under an Emergency Use Authorization (EUA).  This EUA will remain in effect (meaning this test can be used)  for the duration of  the COVID-19 declaration under Section 564(b)(1) of the Act, 21 U.S.C. section 360bbb-3(b)(1), unless the authorization is terminated or revoked sooner.     Influenza A by PCR NEGATIVE NEGATIVE Final   Influenza B by PCR NEGATIVE NEGATIVE Final    Comment: (NOTE) The Xpert Xpress SARS-CoV-2/FLU/RSV plus assay is intended as an  aid in the diagnosis of influenza from Nasopharyngeal swab specimens and should not be used as a sole basis for treatment. Nasal washings and aspirates are unacceptable for Xpert Xpress SARS-CoV-2/FLU/RSV testing.  Fact Sheet for Patients: EntrepreneurPulse.com.au  Fact Sheet for Healthcare Providers: IncredibleEmployment.be  This test is not yet approved or cleared by the [place] FDA and has been authorized for detection and/or diagnosis of SARS-CoV-2 by FDA under an Emergency Use Authorization (EUA). This EUA will remain in effect (meaning this test can be used) for the duration of the COVID-19 declaration under Section 564(b)(1) of the Act, 21 U.S.C. section 360bbb-3(b)(1), unless the authorization is terminated or revoked.  Performed at [facility], [place] [address], [place], [place] [address]          Radiology Studies: No results found.      Scheduled Meds:  aspirin EC  81 mg Oral q morning   Chlorhexidine Gluconate Cloth  6 each Topical Daily   feeding supplement (NEPRO CARB STEADY)  237 mL Oral [id number]   folic acid  1 mg Oral Daily   heparin injection (subcutaneous)  5,000 Units Subcutaneous Q8H   insulin aspart  0-5 Units Subcutaneous QHS   insulin aspart  0-9 Units Subcutaneous TID WC   tamsulosin  0.4 mg Oral QPC supper   Continuous Infusions:     LOS: 8 days      Kayleen Memos, MD [facility]   To contact the attending provider between 7A-7P or the covering provider during after hours 7P-7A, please log into the web site [email or web address] and access using universal [place] password for that web site. If you do not have the password, please call the hospital operator.  07/30/2020, 3:21 PM

## 2020-07-31 ENCOUNTER — Inpatient Hospital Stay (HOSPITAL_COMMUNITY): Payer: Medicare HMO

## 2020-07-31 DIAGNOSIS — I5021 Acute systolic (congestive) heart failure: Secondary | ICD-10-CM | POA: Diagnosis not present

## 2020-07-31 DIAGNOSIS — J189 Pneumonia, unspecified organism: Secondary | ICD-10-CM | POA: Diagnosis not present

## 2020-07-31 LAB — ECHOCARDIOGRAM COMPLETE
AR max vel: 3.52 cm2
AV Area VTI: 3.39 cm2
AV Area mean vel: 3.08 cm2
AV Mean grad: 5 mmHg
AV Peak grad: 8.8 mmHg
Ao pk vel: 1.48 m/s
Height: 70 in
MV M vel: 4.66 m/s
MV Peak grad: 86.9 mmHg
P 1/2 time: 499 msec
S' Lateral: 5.2 cm
Weight: 2913.6 oz

## 2020-07-31 LAB — RENAL FUNCTION PANEL
Albumin: 2.3 g/dL — ABNORMAL LOW (ref 3.5–5.0)
Anion gap: 11 (ref 5–15)
BUN: 43 mg/dL — ABNORMAL HIGH (ref 8–23)
CO2: 25 mmol/L (ref 22–32)
Calcium: 8 mg/dL — ABNORMAL LOW (ref 8.9–10.3)
Chloride: 100 mmol/L (ref 98–111)
Creatinine, Ser: 5.91 mg/dL — ABNORMAL HIGH (ref 0.61–1.24)
GFR, Estimated: 9 mL/min — ABNORMAL LOW (ref >60)
Glucose, Bld: 98 mg/dL (ref 70–99)
Phosphorus: 3.4 mg/dL (ref 2.5–4.6)
Potassium: 3.9 mmol/L (ref 3.5–5.1)
Sodium: 136 mmol/L (ref 135–145)

## 2020-07-31 LAB — BRAIN NATRIURETIC PEPTIDE: B Natriuretic Peptide: 4160.4 pg/mL — ABNORMAL HIGH (ref 0.0–100.0)

## 2020-07-31 LAB — MAGNESIUM: Magnesium: 2 mg/dL (ref 1.7–2.4)

## 2020-07-31 LAB — GLUCOSE, CAPILLARY
Glucose-Capillary: 114 mg/dL — ABNORMAL HIGH (ref 70–99)
Glucose-Capillary: 139 mg/dL — ABNORMAL HIGH (ref 70–99)
Glucose-Capillary: 184 mg/dL — ABNORMAL HIGH (ref 70–99)
Glucose-Capillary: 109 mg/dL — ABNORMAL HIGH (ref 70–99)

## 2020-07-31 NOTE — Progress Notes (Signed)
PROGRESS NOTE    Philip Kerr  [id number] DOB: [birth year] DOA: 07/22/2020 PCP: Biagio Borg, MD    Chief Complaint  Patient presents with   Chest Pain    Brief Narrative:    Philip Kerr is a 87 y.o. male with medical history significant for coronary artery disease with CABG in 1996, ischemic cardiomyopathy, HFrEF 25%, type 2 diabetes mellitus, hypertension, who presented to the ER with chest pain and nausea.  He had a chronic cough that worsened over the past 3 or 4 days but became concerned when he developed severe chest pain while at rest.  In the ED, he had minimal and stable elevation in troponin with no significant change in EKG, he was initially sent home from the ED after evaluation by cardiology.  He immediately returned to the ED due to worsening chest pain, generalized weakness, and cough.  -His work-up in ED was significant for transaminitis, likely bacterial pneumonia, as well as a positive COVID-19 screening test.  Hospital course was complicated by severe delirium, which has now resolved, acute urinary retention with foley inserted by urology due to difficulty, and worsening renal failure.  He was followed by nephrology, signed off and recommended outpatient follow up, nephrology arranging follow up.  Also concern for acute sCHF.  BNP elevated >4000 from 700.  Curbsided with cardiology.  TRH will consult advance failure team when they return on 08/01/2020.  07/31/2020: Seen with his wife at bedside.  He has no new complaints. With poor insights on his health.  Assessment & Plan:   Principal Problem:   Pneumonia Active Problems:   Diabetes mellitus type II, non insulin dependent (HCC)   Essential hypertension   Coronary artery disease   Chronic systolic CHF (congestive heart failure) (HCC)   Dementia with behavioral disturbance (HCC)   Depression   Chronic kidney disease, stage 3a (HCC)   Chest pain   Lab test positive for detection of COVID-19 virus   Left  lower lobe pneumonia, POA: - Presents with atypical chest pain and found to have positive COVID-19 pcr, new leukocytosis, and new consolidation on CXR concerning for PNA  - Presentation most consistent with recent COVID-19 infection and now secondary bacterial pneumonia  Procalcitonin peaked at 20 and downtrending, 5.39 on 07/27/2020. -Initially on Rocephin and doxycycline, then transitioned to Zosyn >> cefepime.  Completed total of 5 days. Currently afebrile with no leukocytosis.   O2 saturation 96-98 % on room air  Nonoliguric AKI in CKD stage IIIa  -Creatinine continues to trend up, baseline around 1.5, it is up to 5.91 from 5.72. - felt secondary to ATN due to hypoperfusion in setting of hypotension and ARB use. -Renal ultrasound significant for bilateral renal cysts. -Continue to avoid nephrotoxic medication, he did not receive any IV contrast. -Losartan has been discontinued Seen by nephrology, signed off on 07/31/2020. Continue to closely monitor urine output Keep Foley catheter in place, will have voiding trial outpatient at urology's office. Will need to follow-up with nephrology and urology after discharge.  Acute on chronic HFrEF 25% from 2D echo 09/11/2017 Last BNP 734 on 07/22/2020, repeated BNP greater than 4000 on 07/31/2020. 2D echo done on 07/31/2020 revealed LVEF 45 to 50%, severe asymmetric left ventricular hypertrophy of the septal segment.  Moderate mitral valve regurgitation. Continue strict I's and O's and daily weight Obtain 2D echo, pending. Consult advanced heart failure team on 08/01/2020, when they return to service.  Left moderate/large pleural effusion, suspect cardiogenic Personally reviewed chest  x-ray done on 07/31/2020 which showed moderate to large left pleural effusion versus atelectasis. Not on diuretics due to worsening AKI Incentive spirometer IR consult for possible left thoracentesis  COVID-19 positive  -Findings on chest x-ray suggest more bacterial  pneumonia than COVID-19 viral pneumonia, he has no hypoxia, so no indication for steroids He initially had elevated LFTs, so could not use IV remdesivir. -will need to be total of 10 days in isolation  Resolved abd pain -He had tender abdomen, with worsening procalcitonin, antibiotics coverage was broadened . - CT abdomen and pelvis without contrast significant for colonic involvement of left inguinal hernia without obstruction or strangulation, I have discussed with cardiology who reviewed imaging, no signs of obstruction or serrated hernia, actually was assessed by surgical PA who was able to reproduce the hernia at bedside. -Abdominal pain currently resolved -Abdominal pain was most likely in the setting of Foley catheter balloon malposition centrally within the prostate gland, please see discussion below  BPH, traumatic Foley insertion -Urology input greatly appreciated, CT finding of Foley catheter balloon in the prostate, unable to insert coud Foley catheter, urology consult greatly appreciated, catheter inserted, recommendation to continue Foley catheter for 2 weeks, and voiding trial after that, likely in the urologist office. -Continue with tamsulosin Continue to monitor urine output.  Resolved elevated liver chemistries. -Negative hepatitis panel . -Tylenol level <10 -Ultrasound with no evidence of acute cholecystitis, common bile duct measures 8 mm, mild central intrahepatic bile duct dilation, likely chronic. -LFTs have normalized, alkaline phosphatase, AST, ALT, T bilirubin.   Resolved acute metabolic encephalopathy Reorient as needed. -Required multiple dosing of Ativan initially, unable to use any LidAll due to prolonged QTC . -Have requested family to spend time with the patient, discussed with staff to allow permission for family members to alternate for someone to stay at bedside to minimize risk of fall, to keep patient interactive and to avoid worsening  delirium  Resolved post repletion: Hypokalemia Repleted judiciously in the setting of renal insufficiency  Resolved severe hypophosphatemia -Phosphorus lipids severely low at<1, repleted Repeated phosphorus level 3.1.  Hypomagnesemia Serum magnesium 1.6 Repleted orally Repeat level  Macrocytosis -Low folic acid level, started on supplements  Resolved chest pain; CAD  - Presents with chest pain at rest, had stable mild troponin elevation, and was seen by cardiology in the ED with low-suspicion for cardiac etiology - He had leukocytosis and increasing consolidation on left-sided CXR and his complaints could be due to PNA  - Continue ASA, statin, beta-blocker  -Follow 2D echo ordered on 07/29/2020.  Hypertension  BP is currently at goal 136/72. -losartan has been discontinued in setting of AKI, blood pressure is soft so metoprolol has been held.   Type II DM, currently well controlled. - Serum glucose is 214 in ED; A1c was 6.5% in December 2021  Hemoglobin A1c 6.4 on 07/23/2020. - He is diet-controlled at home  - Monitor CBGs and use low-intensity SSI if needed   Chronic back pain Analgesic as needed.   DVT prophylaxis: Lovenox subcu daily Code Status: DNR Family Communication: Discussed plan of care with his wife at bedside on 07/31/2020.  Discussed plan of care with his son at bedside on 07/30/2020.  Discussed plan of care with his wife at bedside on 07/29/2020.     Prognosis: Guarded Disposition:   Status is: Inpatient  Remains inpatient appropriate because:IV treatments appropriate due to intensity of illness or inability to take PO  Dispo: The patient is from: Home  Anticipated d/c is to: Home likely on 08/01/2020 or when heart failure team signs off.                Patient currently is not medically stable to d/c.  He remains on IV antibiotics, with worsening renal function   Difficult to place patient No       Consultants:  Renal Urolgoy Please  consult advance failure team on 08/01/2020.  Curb sided with cardiology Dr. Domenic Polite on 07/31/2020.    Objective: Vitals:   07/30/20 2100 07/31/20 0015 07/31/20 0430 07/31/20 0736  BP: (!) 125/59 (!) 138/91 (!) 119/49 125/83  Pulse: 68 80 81 91  Resp: '19 20 17 16  '$ Temp: 98.1 F (36.7 C) 98.2 F (36.8 C) 98.2 F (36.8 C) 97.8 F (36.6 C)  TempSrc: Oral Oral Oral Oral  SpO2: 96% 94% 95% 94%  Weight:        Intake/Output Summary (Last 24 hours) at 07/31/2020 1127 Last data filed at 07/31/2020 0435 Gross per 24 hour  Intake 960 ml  Output 900 ml  Net 60 ml   Filed Weights   07/27/20 0500 07/29/20 0451 07/30/20 0600  Weight: 81.3 kg 81.4 kg 82.6 kg    Examination:  General: Pleasant in no acute distress.  He is alert and interactive.   Respiratory: Mild rales at bases no wheezing noted.  Good inspiratory effort.   Cardiac: Regular rate and rhythm no rubs or gallops.  No JVD or thyromegaly noted.  GI: Nontender nondistended bowel sounds present.   Musculoskeletal: No lower extremity edema bilaterally.   Skin: No rashes noted Neuro: No motor or focal neurodeficits.   Psych: Mood is appropriate for condition and setting.  Data Reviewed: I have personally reviewed following labs and imaging studies  CBC: Recent Labs  Lab 07/25/20 0018 07/26/20 0109 07/27/20 0108 07/28/20 0502  WBC 11.5* 8.4 7.8 8.3  NEUTROABS 9.8* 6.0 4.7  --   HGB 10.4* 9.1* 9.8* 9.6*  HCT 31.7* 26.8* 29.0* 27.9*  MCV 105.7* 101.1* 101.4* 101.1*  PLT 170 142* 154 141*    Basic Metabolic Panel: Recent Labs  Lab 07/25/20 0018 07/26/20 0109 07/27/20 0108 07/28/20 0502 07/29/20 0046 07/30/20 0046 07/30/20 0432 07/31/20 0544  NA 140 137 138 138 135 133*  --  136  K 4.5 3.9 3.5 3.2* 3.2* 4.4 4.0 3.9  CL 113* 108 102 99 98 99  --  100  CO2 15* 19* '22 25 27 24  '$ --  25  GLUCOSE 76 220* 110* 161* 128* 110*  --  98  BUN 51* 51* 45* 42* 41* 42*  --  43*  CREATININE 4.47* 5.30* 5.72* 5.73* 5.79*  5.72*  --  5.91*  CALCIUM 7.8* 7.5* 7.6* 7.5* 7.6* 7.6*  --  8.0*  MG  --   --   --   --   --   --  1.6* 2.0  PHOS 4.3 3.1  --   --  2.9 3.1  --  3.4    GFR: Estimated Creatinine Clearance: 9.6 mL/min (A) (by C-G formula based on SCr of 5.91 mg/dL (H)).  Liver Function Tests: Recent Labs  Lab 07/25/20 0018 07/26/20 0109 07/27/20 0108 07/28/20 0502 07/29/20 0046 07/30/20 0046 07/31/20 0544  AST 75* 34 24 19  --   --   --   ALT 110* 62* 48* 32  --   --   --   ALKPHOS 174* 133* 111 99  --   --   --  BILITOT 1.3* 0.8 0.8 0.8  --   --   --   PROT 5.9* 5.2* 5.3* 5.0*  --   --   --   ALBUMIN 2.9* 2.6* 2.4* 2.2* 2.3* 2.2* 2.3*    CBG: Recent Labs  Lab 07/30/20 0815 07/30/20 1243 07/30/20 1655 07/30/20 2101 07/31/20 0734  GLUCAP 105* 130* 130* 127* 114*     Recent Results (from the past 240 hour(s))  Resp Panel by RT-PCR (Flu A&B, Covid) Nasopharyngeal Swab     Status: Abnormal   Collection Time: 07/22/20  7:43 AM   Specimen: Nasopharyngeal Swab; Nasopharyngeal(NP) swabs in vial transport medium  Result Value Ref Range Status   SARS Coronavirus 2 by RT PCR POSITIVE (A) NEGATIVE Final    Comment: RESULT CALLED TO, READ BACK BY AND VERIFIED WITH: RN L MEEKS [id number] (NOTE) SARS-CoV-2 target nucleic acids are DETECTED.  The SARS-CoV-2 RNA is generally detectable in upper respiratory specimens during the acute phase of infection. Positive results are indicative of the presence of the identified virus, but do not rule out bacterial infection or co-infection with other pathogens not detected by the test. Clinical correlation with patient history and other diagnostic information is necessary to determine patient infection status. The expected result is Negative.  Fact Sheet for Patients: EntrepreneurPulse.com.au  Fact Sheet for Healthcare Providers: IncredibleEmployment.be  This test is not yet approved or cleared by the  [place] FDA and  has been authorized for detection and/or diagnosis of SARS-CoV-2 by FDA under an Emergency Use Authorization (EUA).  This EUA will remain in effect (meaning this test can be used)  for the duration of  the COVID-19 declaration under Section 564(b)(1) of the Act, 21 U.S.C. section 360bbb-3(b)(1), unless the authorization is terminated or revoked sooner.     Influenza A by PCR NEGATIVE NEGATIVE Final   Influenza B by PCR NEGATIVE NEGATIVE Final    Comment: (NOTE) The Xpert Xpress SARS-CoV-2/FLU/RSV plus assay is intended as an aid in the diagnosis of influenza from Nasopharyngeal swab specimens and should not be used as a sole basis for treatment. Nasal washings and aspirates are unacceptable for Xpert Xpress SARS-CoV-2/FLU/RSV testing.  Fact Sheet for Patients: EntrepreneurPulse.com.au  Fact Sheet for Healthcare Providers: IncredibleEmployment.be  This test is not yet approved or cleared by the [place] FDA and has been authorized for detection and/or diagnosis of SARS-CoV-2 by FDA under an Emergency Use Authorization (EUA). This EUA will remain in effect (meaning this test can be used) for the duration of the COVID-19 declaration under Section 564(b)(1) of the Act, 21 U.S.C. section 360bbb-3(b)(1), unless the authorization is terminated or revoked.  Performed at [facility], [place] [address] [place], [place] [address]          Radiology Studies: No results found.      Scheduled Meds:  aspirin EC  81 mg Oral q morning   Chlorhexidine Gluconate Cloth  6 each Topical Daily   feeding supplement (NEPRO CARB STEADY)  237 mL Oral [id number]   folic acid  1 mg Oral Daily   heparin injection (subcutaneous)  5,000 Units Subcutaneous Q8H   insulin aspart  0-5 Units Subcutaneous QHS   insulin aspart  0-9 Units Subcutaneous TID WC   tamsulosin  0.4 mg Oral QPC supper   Continuous Infusions:     LOS: 9 days       Kayleen Memos, MD [facility]   To contact the attending provider between 7A-7P or  the covering provider during after hours 7P-7A, please log into the web site [email or web address] and access using universal [place] password for that web site. If you do not have the password, please call the hospital operator.  07/31/2020, 11:27 AM

## 2020-07-31 NOTE — Progress Notes (Signed)
  Echocardiogram 2D Echocardiogram has been performed.  Philip Kerr 07/31/2020, 2:02 PM

## 2020-07-31 NOTE — Progress Notes (Signed)
Subjective:  Pt seen -  no c/o's -  UOP stable to increased -  at least 900 over last 24 hours-  BUN and crt are stable still -   he does not seem uremic   Objective Vital signs in last 24 hours: Vitals:   07/30/20 2100 07/31/20 0015 07/31/20 0430 07/31/20 0736  BP: (!) 125/59 (!) 138/91 (!) 119/49 125/83  Pulse: 68 80 81 91  Resp: '19 20 17 16  '$ Temp: 98.1 F (36.7 C) 98.2 F (36.8 C) 98.2 F (36.8 C) 97.8 F (36.6 C)  TempSrc: Oral Oral Oral Oral  SpO2: 96% 94% 95% 94%  Weight:       Weight change:   Intake/Output Summary (Last 24 hours) at 07/31/2020 0840 Last data filed at 07/31/2020 0435 Gross per 24 hour  Intake 960 ml  Output 900 ml  Net 60 ml    Assessment/ Plan: Pt is a 87 y.o. yo male with CAD, DM, HTN, ICM who was admitted on 07/22/2020 with  symptomatic covid/PNA-  developed AKI when BP dropped on ARB Assessment/Plan: 1. Renal-  Baseline crt 1.3- 1.5.  AKI in the setting of covid illness, hypotension +/- obstruction -  UOP decreased initially but now good-  foley in place.  BUN and crt stable for the last 4 days, at a plateau- does not seem to be uremic and there are no acute indications for HD.    We are seeing a plateau -  next step would be to see improvement but has not happened yet-  I have modified my recommendations-  since crt is  stable I would feel comfortable with discharge. He will need to keep foley in however and follow up op with urology as sounds like he has had LUTS for many months-  he will need follow up with nephrology and urology at discharge 2. HTN/volume-  not wet-   stopped IVF 3. Anemia-  not a significant issue at this time but is dropping not surprisingly 4. Metabolic acidosis-  improved    I am ok with discharge-  I will arrange follow up with nephrology and sign off  [place]: Basic Metabolic Panel: Recent Labs  Lab 07/29/20 0046 07/30/20 0046 07/30/20 0432 07/31/20 0544  NA 135 133*  --  136  K 3.2* 4.4 4.0  3.9  CL 98 99  --  100  CO2 27 24  --  25  GLUCOSE 128* 110*  --  98  BUN 41* 42*  --  43*  CREATININE 5.79* 5.72*  --  5.91*  CALCIUM 7.6* 7.6*  --  8.0*  PHOS 2.9 3.1  --  3.4   Liver Function Tests: Recent Labs  Lab 07/26/20 0109 07/27/20 0108 07/28/20 0502 07/29/20 0046 07/30/20 0046 07/31/20 0544  AST 34 24 19  --   --   --   ALT 62* 48* 32  --   --   --   ALKPHOS 133* 111 99  --   --   --   BILITOT 0.8 0.8 0.8  --   --   --   PROT 5.2* 5.3* 5.0*  --   --   --   ALBUMIN 2.6* 2.4* 2.2* 2.3* 2.2* 2.3*   No results for input(s): LIPASE, AMYLASE in the last 168 hours. No results for input(s): AMMONIA in the last 168 hours.  CBC: Recent Labs  Lab 07/25/20 0018 07/26/20 0109 07/27/20 0108 07/28/20 0502  WBC 11.5*  8.4 7.8 8.3  NEUTROABS 9.8* 6.0 4.7  --   HGB 10.4* 9.1* 9.8* 9.6*  HCT 31.7* 26.8* 29.0* 27.9*  MCV 105.7* 101.1* 101.4* 101.1*  PLT 170 142* 154 141*   Cardiac Enzymes: No results for input(s): CKTOTAL, CKMB, CKMBINDEX, TROPONINI in the last 168 hours. CBG: Recent Labs  Lab 07/30/20 0815 07/30/20 1243 07/30/20 1655 07/30/20 2101 07/31/20 0734  GLUCAP 105* 130* 130* 127* 114*    Iron Studies:  No results for input(s): IRON, TIBC, TRANSFERRIN, FERRITIN in the last 72 hours.  Studies/Results: No results found. Medications: Infusions:    Scheduled Medications:  aspirin EC  81 mg Oral q morning   Chlorhexidine Gluconate Cloth  6 each Topical Daily   feeding supplement (NEPRO CARB STEADY)  237 mL Oral [id number]   folic acid  1 mg Oral Daily   heparin injection (subcutaneous)  5,000 Units Subcutaneous Q8H   insulin aspart  0-5 Units Subcutaneous QHS   insulin aspart  0-9 Units Subcutaneous TID WC   tamsulosin  0.4 mg Oral QPC supper    have reviewed scheduled and prn medications.  Physical Exam: General: NAD-  joking  Heart: RRR Lungs: mostly clear Abdomen: soft, non tender Extremities: min edema     07/31/2020,8:40 AM  LOS: 9 days

## 2020-07-31 NOTE — Progress Notes (Signed)
1900 Pt was noted to have dark bloody colored urine & some clots passing in foley catheter. Pt denies any discomfort.

## 2020-08-01 DIAGNOSIS — J189 Pneumonia, unspecified organism: Secondary | ICD-10-CM | POA: Diagnosis not present

## 2020-08-01 DIAGNOSIS — I509 Heart failure, unspecified: Secondary | ICD-10-CM

## 2020-08-01 DIAGNOSIS — I5021 Acute systolic (congestive) heart failure: Secondary | ICD-10-CM | POA: Diagnosis not present

## 2020-08-01 DIAGNOSIS — N179 Acute kidney failure, unspecified: Secondary | ICD-10-CM

## 2020-08-01 DIAGNOSIS — N1831 Chronic kidney disease, stage 3a: Secondary | ICD-10-CM | POA: Diagnosis not present

## 2020-08-01 LAB — BASIC METABOLIC PANEL
Anion gap: 8 (ref 5–15)
BUN: 46 mg/dL — ABNORMAL HIGH (ref 8–23)
CO2: 23 mmol/L (ref 22–32)
Calcium: 7.9 mg/dL — ABNORMAL LOW (ref 8.9–10.3)
Chloride: 102 mmol/L (ref 98–111)
Creatinine, Ser: 5.83 mg/dL — ABNORMAL HIGH (ref 0.61–1.24)
GFR, Estimated: 9 mL/min — ABNORMAL LOW (ref >60)
Glucose, Bld: 110 mg/dL — ABNORMAL HIGH (ref 70–99)
Potassium: 4.7 mmol/L (ref 3.5–5.1)
Sodium: 133 mmol/L — ABNORMAL LOW (ref 135–145)

## 2020-08-01 LAB — CBC
HCT: 25.6 % — ABNORMAL LOW (ref 39.0–52.0)
Hemoglobin: 8.5 g/dL — ABNORMAL LOW (ref 13.0–17.0)
MCH: 34.6 pg — ABNORMAL HIGH (ref 26.0–34.0)
MCHC: 33.2 g/dL (ref 30.0–36.0)
MCV: 104.1 fL — ABNORMAL HIGH (ref 80.0–100.0)
Platelets: 237 10*3/uL (ref 150–400)
RBC: 2.46 MIL/uL — ABNORMAL LOW (ref 4.22–5.81)
RDW: 14.1 % (ref 11.5–15.5)
WBC: 9 10*3/uL (ref 4.0–10.5)
nRBC: 0 % (ref 0.0–0.2)

## 2020-08-01 LAB — GLUCOSE, CAPILLARY
Glucose-Capillary: 99 mg/dL (ref 70–99)
Glucose-Capillary: 188 mg/dL — ABNORMAL HIGH (ref 70–99)
Glucose-Capillary: 138 mg/dL — ABNORMAL HIGH (ref 70–99)
Glucose-Capillary: 128 mg/dL — ABNORMAL HIGH (ref 70–99)

## 2020-08-01 LAB — PHOSPHORUS: Phosphorus: 3.5 mg/dL (ref 2.5–4.6)

## 2020-08-01 LAB — MAGNESIUM: Magnesium: 1.9 mg/dL (ref 1.7–2.4)

## 2020-08-01 MED ORDER — METOPROLOL SUCCINATE ER 25 MG PO TB24
12.5000 mg | ORAL_TABLET | Freq: Every day | ORAL | Status: DC
  Administered 2020-08-01 – 2020-08-03 (×3): 12.5 mg via ORAL
  Filled 2020-08-01 (×3): qty 1

## 2020-08-01 MED ORDER — FENTANYL CITRATE (PF) 100 MCG/2ML IJ SOLN
12.5000 ug | Freq: Once | INTRAMUSCULAR | Status: AC | PRN
Start: 2020-08-01 — End: 2020-08-03
  Administered 2020-08-03: 12.5 ug via INTRAVENOUS
  Filled 2020-08-01: qty 2

## 2020-08-01 MED ORDER — OXYCODONE HCL 5 MG PO TABS
5.0000 mg | ORAL_TABLET | ORAL | Status: DC | PRN
  Administered 2020-08-01 – 2020-08-06 (×16): 5 mg via ORAL
  Filled 2020-08-01 (×17): qty 1

## 2020-08-01 NOTE — Progress Notes (Signed)
Progress Note  Patient Name: Philip Kerr Date of Encounter: 08/01/2020  [facility] Cardiologist: Philip Chandler, MD   Subjective   Alert, confused, denies CP or SOB Resting comfortably on room air lying supine  Family is concerned because of grossly bloody urine  Inpatient Medications    Scheduled Meds:  Chlorhexidine Gluconate Cloth  6 each Topical Daily   feeding supplement (NEPRO CARB STEADY)  237 mL Oral H70Y   folic acid  1 mg Oral Daily   insulin aspart  0-5 Units Subcutaneous QHS   insulin aspart  0-9 Units Subcutaneous TID WC   tamsulosin  0.4 mg Oral QPC supper   Continuous Infusions:  PRN Meds: acetaminophen, fentaNYL (SUBLIMAZE) injection, guaiFENesin-dextromethorphan, ondansetron **OR** ondansetron (ZOFRAN) IV, oxyCODONE, polyethylene glycol, senna-docusate, traZODone   Vital Signs    Vitals:   07/31/20 2335 08/01/20 0313 08/01/20 0500 08/01/20 0733  BP: (!) 145/60 137/72  (!) 126/59  Pulse: 71 81  92  Resp: _0 Temp: 98 F (36.7 C) (!) 97.5 F (36.4 C)  97.7 F (36.5 C)  TempSrc: Oral Oral  Oral  SpO2: 96% 94%  95%  Weight:   81.6 kg     Intake/Output Summary (Last 24 hours) at 08/01/2020 1110 Last data filed at 07/31/2020 1230 Gross per 24 hour  Intake --  Output 325 ml  Net -325 ml   Last 3 Weights 08/01/2020 07/30/2020 07/29/2020  Weight (lbs) 179 lb 14.3 oz 182 lb 1.6 oz 179 lb 7.3 oz  Weight (kg) 81.6 kg 82.6 kg 81.4 kg      Telemetry    SR, frequent PVCs, multiple runs of VT 3-12 beats - Personally Reviewed  ECG    07/30/2020 SR, HR 74, QRS 150 ms w/ NS IVC defect, unchanged since 07/20  - Personally Reviewed  Physical Exam   GEN: No acute distress.   Neck: JVD 10 cm Cardiac: RRR, 2-3/6 AS murmur, no rubs, or gallops.  Respiratory: exp wheeze, decreased BS L>R bases, few rales GI: Soft, nontender, non-distended  MS: No edema; No deformity. Neuro:  Nonfocal  Psych: Normal affect   Labs    High  Sensitivity Troponin:   Recent Labs  Lab 07/22/20 0614 07/22/20 0840 07/22/20 1805 07/22/20 2005  TROPONINIHS 28* 24* 27* 32*      Chemistry Recent Labs  Lab 07/26/20 0109 07/27/20 0108 07/28/20 0502 07/29/20 0046 07/30/20 0046 07/30/20 0432 07/31/20 0544 08/01/20 0153  NA 137 138 138 135 133*  --  136 133*  K 3.9 3.5 3.2* 3.2* 4.4 4.0 3.9 4.7  CL 108 102 99 98 99  --  100 102  CO2 19* _1 --  25 23  GLUCOSE 220* 110* 161* 128* 110*  --  98 110*  BUN 51* 45* 42* 41* 42*  --  43* 46*  CREATININE 5.30* 5.72* 5.73* 5.79* 5.72*  --  5.91* 5.83*  CALCIUM 7.5* 7.6* 7.5* 7.6* 7.6*  --  8.0* 7.9*  PROT 5.2* 5.3* 5.0*  --   --   --   --   --   ALBUMIN 2.6* 2.4* 2.2* 2.3* 2.2*  --  2.3*  --   AST 34 24 19  --   --   --   --   --   ALT 62* 48* 32  --   --   --   --   --   ALKPHOS 133* 111 99  --   --   --   --   --  BILITOT 0.8 0.8 0.8  --   --   --   --   --   GFRNONAA 10* 9* 9* 9* 9*  --  9* 9*  ANIONGAP _0 --  11 8    Magnesium  Date Value Ref Range Status  08/01/2020 1.9 1.7 - 2.4 mg/dL Final    Comment:    Performed at [facility] [address] [place], [place] [address]  07/31/2020 2.0 1.7 - 2.4 mg/dL Final    Comment:    Performed at [facility] [address] [place], [place] [address]  07/30/2020 1.6 (L) 1.7 - 2.4 mg/dL Final    Comment:    Performed at [facility], [place] [address] [place], [place] [address]    Hematology Recent Labs  Lab 07/27/20 [phone] 07/28/20 0502 08/01/20 0153  WBC 7.8 8.3 9.0  RBC 2.86* 2.76* 2.46*  HGB 9.8* 9.6* 8.5*  HCT 29.0* 27.9* 25.6*  MCV 101.4* 101.1* 104.1*  MCH 34.3* 34.8* 34.6*  MCHC 33.8 34.4 33.2  RDW 13.4 13.6 14.1  PLT 154 141* 237    BNP  B Natriuretic Peptide  Date Value Ref Range Status  07/31/2020 4,160.4 (H) 0.0 - 100.0 pg/mL Final    Comment:    Performed at [facility], [place] [address], [place], [place] [address]  07/22/2020 734.0 (H) 0.0 - 100.0  pg/mL Final    Comment:    Performed at [facility] [address] [place], [place] [address]    Lab Results  Component Value Date   TSH 3.00 07/17/2019   Lab Results  Component Value Date   HGBA1C 6.4 (H) 07/23/2020    DDimer No results for input(s): DDIMER in the last 168 hours.   Radiology    DG CHEST PORT 1 VIEW  Result Date: 07/31/2020 CLINICAL DATA:  Acute CHF EXAM: PORTABLE CHEST 1 VIEW COMPARISON:  July 24, 2020 FINDINGS: The cardiomediastinal silhouette is unchanged and enlarged in contour.Status post median sternotomy and CABG. Multiple clips project over the LEFT chest. Incomplete visualization of the RIGHT costophrenic angle. Unchanged LEFT-sided pleural blunting. No pneumothorax. Persistent and increased heterogeneous opacities of the LEFT retrocardiac region. No new opacities in the RIGHT lung. Visualized abdomen is unremarkable. Remote LEFT-sided rib fracture. IMPRESSION: 1. Mild increase in heterogeneous opacities in the LEFT lung base. Differential considerations include infection or atelectasis. Electronically Signed   By: Philip Saxon MD   On: 07/31/2020 11:58   ECHOCARDIOGRAM COMPLETE  Result Date: 07/31/2020    ECHOCARDIOGRAM LIMITED REPORT   Patient Name:   Philip Kerr Date of Exam: 07/31/2020 Medical Rec #:  [id number]        Height:       70.0 in Accession #:    [id number]       Weight:       182.1 lb Date of Birth:  [birth year]        BSA:          2.006 m Patient Age:    87 years         BP:           125/83 mmHg Patient Gender: M                HR:           91 bpm. Exam Location:  Inpatient Procedure: 2D Echo, Cardiac Doppler and Color Doppler Indications:  CHF-Acute Systolic  History:        Patient has prior history of Echocardiogram examinations, most                 recent 09/11/2017. CAD, Prior CABG; Risk Factors:Hypertension and                 Diabetes. Ischemic cardiomyopathy. HFpEF. COVID-19.  Sonographer:    Philip Kerr RDCS (AE) Referring  Phys: [id number] Philip Kerr  Sonographer Comments: No subcostal window. COVID-19 IMPRESSIONS  1. Left ventricular ejection fraction, by estimation, is 45 to 50%. The left ventricle has mildly decreased function. Left ventricular endocardial border not optimally defined to evaluate regional wall motion. There is severe asymmetric left ventricular  hypertrophy of the septal segment. Left ventricular diastolic parameters are indeterminate.  2. Right ventricular systolic function is normal. The right ventricular size is normal. Tricuspid regurgitation signal is inadequate for assessing PA pressure.  3. Left atrial size was mildly dilated.  4. The mitral valve is abnormal. Moderate mitral valve regurgitation. No evidence of mitral stenosis.  5. Tricuspid valve regurgitation is mild to moderate.  6. The aortic valve is tricuspid. There is mild calcification of the aortic valve. There is mild thickening of the aortic valve.  7. Aortic dilatation noted. There is mild dilatation of the aortic root, measuring 40 mm. FINDINGS  Left Ventricle: Left ventricular ejection fraction, by estimation, is 45 to 50%. The left ventricle has mildly decreased function. Left ventricular endocardial border not optimally defined to evaluate regional wall motion. The left ventricular internal cavity size was normal in size. There is severe asymmetric left ventricular hypertrophy of the septal segment. Left ventricular diastolic parameters are indeterminate. Right Ventricle: The right ventricular size is normal. No increase in right ventricular wall thickness. Right ventricular systolic function is normal. Tricuspid regurgitation signal is inadequate for assessing PA pressure. Left Atrium: Left atrial size was mildly dilated. Right Atrium: Right atrial size was normal in size. Mitral Valve: The mitral valve is abnormal. There is mild thickening of the mitral valve leaflet(s). There is mild calcification of the mitral valve leaflet(s). Mild  mitral annular calcification. Moderate mitral valve regurgitation. No evidence of mitral  valve stenosis. Tricuspid Valve: The tricuspid valve is not well visualized. Tricuspid valve regurgitation is mild to moderate. No evidence of tricuspid stenosis. Aortic Valve: The aortic valve is tricuspid. There is mild calcification of the aortic valve. There is mild thickening of the aortic valve. There is mild aortic valve annular calcification. Aortic regurgitation PHT measures 499 msec. Aortic valve mean gradient measures 5.0 mmHg. Aortic valve peak gradient measures 8.8 mmHg. Aortic valve area, by VTI measures 3.39 cm. Pulmonic Valve: The pulmonic valve was not well visualized. Pulmonic valve regurgitation is mild. No evidence of pulmonic stenosis. Aorta: The aortic root is normal in size and structure and aortic dilatation noted. There is mild dilatation of the aortic root, measuring 40 mm. Venous: The inferior vena cava was not well visualized. LEFT VENTRICLE PLAX 2D LVIDd:         5.80 cm LVIDs:         5.20 cm LV PW:         1.20 cm LV IVS:        1.70 cm LVOT diam:     2.40 cm LV SV:         89 LV SV Index:   45 LVOT Area:     4.52 cm  RIGHT VENTRICLE RV Basal diam:  3.30 cm RV S prime:     12.00 cm/s TAPSE (M-mode): 1.3 cm LEFT ATRIUM           Index       RIGHT ATRIUM           Index LA diam:      4.80 cm 2.39 cm/m  RA Area:     14.90 cm LA Vol (A4C): 70.5 ml 35.15 ml/m RA Volume:   35.30 ml  17.60 ml/m  AORTIC VALVE AV Area (Vmax):    3.52 cm AV Area (Vmean):   3.08 cm AV Area (VTI):     3.39 cm AV Vmax:           148.01 cm/s AV Vmean:          103.139 cm/s AV VTI:            0.264 m AV Peak Grad:      8.8 mmHg AV Mean Grad:      5.0 mmHg LVOT Vmax:         115.02 cm/s LVOT Vmean:        70.152 cm/s LVOT VTI:          0.197 m LVOT/AV VTI ratio: 0.75 AI PHT:            499 msec  AORTA Ao Root diam: 3.60 cm Ao Asc diam:  4.00 cm MR Peak grad: 86.9 mmHg MR Mean grad: 47.0 mmHg   SHUNTS MR Vmax:      466.00  cm/s Systemic VTI:  0.20 m MR Vmean:     316.0 cm/s  Systemic Diam: 2.40 cm Carlyle Dolly MD Electronically signed by Carlyle Dolly MD Signature Date/Time: 07/31/2020/2:13:11 PM    Final     Cardiac Studies   ECHO: 07/31/2020  1. Left ventricular ejection fraction, by estimation, is 45 to 50%. The left ventricle has mildly decreased function. Left ventricular endocardial border not optimally defined to evaluate regional wall motion. There is severe asymmetric left ventricular  hypertrophy of the septal segment. Left ventricular diastolic parameters  are indeterminate.   2. Right ventricular systolic function is normal. The right ventricular size is normal. Tricuspid regurgitation signal is inadequate for assessing PA pressure.   3. Left atrial size was mildly dilated.   4. The mitral valve is abnormal. Moderate mitral valve regurgitation. No evidence of mitral stenosis.   5. Tricuspid valve regurgitation is mild to moderate.   6. The aortic valve is tricuspid. There is mild calcification of the  aortic valve. There is mild thickening of the aortic valve.   7. Aortic dilatation noted. There is mild dilatation of the aortic root,  measuring 40 mm.   ECHO: 09/11/2017 - Left ventricle: The cavity size was moderately dilated. Wall    thickness was increased in a pattern of severe LVH. Systolic    function was severely reduced. The estimated ejection fraction    was in the range of 25% to 30%. Diffuse hypokinesis. Left    ventricular diastolic function parameters were normal.  - Aortic valve: There was mild regurgitation.  - Mitral valve: There was moderate regurgitation.  - Left atrium: The atrium was moderately dilated.  - Atrial septum: No defect or patent foramen ovale was identified.   Patient Profile     87 y.o. male with a hx of CAD s/p CABG x5V in 1996, chronic LBBB, ischemic CM, systolic and diastolic heart failure, mitral regurgitation, AI, HTN, HLD, type 2 DM, CKD stage III,  BPH,  and childhood left lobectomy.  He was admitted 07/15 with chest pain, relieved w/ ASA, ntg.  Cr 1.44. COVID +. BNP 700, EF improved from 2019. Euvolemic at 167 lbs. Pt has refused ICD in the past. Pt not interested in invasive workup, f/u on 08/18 as outpt.  Pt felt to have COVID PNA, started on IV ABX. Continued on home rx including losartan.  07/16, pt w/ transaminitis, AST/ALT 798/338 w/ Alk Phos 224. LDH 1501, Ferritin 2204, CRP 8.8 (peak 18.6), procalcitonin 0.51 (later peaked at 20.87).  Cr trended up, Nephrology saw 07/17 due to Cr 3.3 and decreased UOP. Losartan and metop d/c'd. Foley cath inserted but poor position so had to be replaced more than once. Urine was bloody but improved. UOP <800 cc/day. BUN/Cr continued to trend up. IVF given, up to > 4L/day. Got Bicarb for metabolic acidosis with improvement. Phos was low, repleted.  AKI on CKD IIIa felt 2nd ATN due to hypoperfusion 2nd hypotension and ARB use.  Pt developed acute metabolic encephalopathy/hospital delirium.   By 07/23, BUN/Cr although very high are felt to have plateaued. Not uremic, UOP slowly improving, no indication for HD. Keep foley in place, f/u with Nephrology and Urology after d/c.   BNP rechecked 07/24 and elevated at 7,000. Wt has trended up since admit. Cards asked to see.  Assessment & Plan    Acute on chronic systolic CHF - wt 163.8 >> 173  overnight after admit >> 179 on 07/20 >> 182 on  07/23 >> 179 today - diuretic management per Nephrology - low Na diet - cannot use ARB/ACE/ARNI w/ decreased renal function - EF has improved compared to 2019 - BP has been too low to continue BB, but has improved - discuss starting Toprol XL 12.5 mg qd or Coreg 3.125 mg bid - he has f/u appt arranged - do not think CHF team needs to see, interventions limited by renal function and Nephrologist feels CHF exacerbation related to renal failure, problems w/ UOP, and IVF.  - He may autodiurese as he continues to  improve - currently, not acutely SOB  2. NSVT - Pt asymptomatic - discuss adding BB w/ MD - Mg has been supplemented by IM, continue to follow  3. COVID +, PNA w/ complications - Mgt per IM     For questions or updates, please contact [facility] Please consult [email or web address] for contact info under        Signed, Rosaria Ferries, PA-C  08/01/2020, 11:10 AM

## 2020-08-01 NOTE — Progress Notes (Signed)
Patient needed numerous linen changes and peri/foley care due to bleeding at the site of foley insertion. On-call physician paged multiple times. Anticoagulant therapy has been held per orders. MD to contact urology for further evaluation.

## 2020-08-01 NOTE — Progress Notes (Signed)
Pt foley assessed at request of Dr. Marlowe Sax. Pt having hematuria and pt having clots/leaking from urethra around foley catheter. Foley flushed with sterile water-several clots evacuated. Per urology suggestion, Foley then pulled taut and taped to pt's leg. Gauze placed under penis to elevate it. Urology to see pt this AM.

## 2020-08-01 NOTE — Progress Notes (Signed)
I assessed the patient due to persistent urethral bleeding around the catheter. I had asked the on-call physician to put the catheter on some tension and to accordion to penis and keep an in place with some gauze around the catheter. When I evaluated the patient, the area was hemostatic. Will leave it in this position. Would recommend removing gauze on penis around the catheter tomorrow. And then would recommend taking the catheter off  of the slight tension the next day. If any issues please reach out to [place].  Bishop Limbo, MD [facility]

## 2020-08-01 NOTE — Progress Notes (Signed)
PT Cancellation Note  Patient Details Name: Philip Kerr MRN: [id number] DOB: 02-28-1933   Cancelled Treatment:    Reason Eval/Treat Not Completed: Medical issues which prohibited therapy Noted rapid response from earlier this morning regarding issues with bleeding/leaking/clots in foley cath. Discussed with RN, in agreement to hold PT today. Will f/u on next day of service.   [name], DPT, PN1   Supplemental Physical Therapist [facility]    Pager [phone] Acute Rehab Office [phone]

## 2020-08-01 NOTE — Progress Notes (Signed)
PROGRESS NOTE    Philip Kerr  [id number] DOB: July 30, 1933 DOA: 07/22/2020 PCP: Biagio Borg, MD   Chief Complaint  Patient presents with   Chest Pain   Brief Narrative: 87 year old male with CAD, CABG 1996/ischemic cardiomyopathy, chronic systolic CHF EF 123456, 123456, HTN presented with chest pain, nausea on had chronic cough that worsened past 3 to 4 days prior to admission and developed chest pain while at rest.  In the ED minimal and stable elevated troponin without EKG changes so was sent home from ED but immediately returned due to worsening chest pain weakness and cough and work-up found transaminitis, possible bacterial pneumonia, COVID-19 positive.  Hospital course complicated by severe delirium that has now resolved, acute urine retention with Foley inserted by urology due to difficulty and worsening renal failure, was seen by nephrology and they have signed off with outpatient follow-up recommendation.  Subjective: Seen and examined this morning. Hemostasis achieved with dressing seen by urology.  Complains of pain at the site Due to Foley.  Assessment & Plan:  Left lower lobe pneumonia COVID-19 positive Most consistent with bacterial pneumonia procalcitonin peaked at 20 and downtrended to 5.3.  Initially on ceftriaxone/doxycycline changed to Zosyn>>cefepime-completed antibiotic.Patient has not been hypoxic.  Patient can come off isolation after 10 days.  Due to elevated LFTs initially could not use remdesivir, has not been hypoxic and covifd infection seems to have improved.  Chest x-ray 7/24 showed mild increase in heterogeneous opacity in the left lung base infection versus versus atelectasis-advised incentive spirometry ambulation. Recent Labs  Lab 07/26/20 0109 07/27/20 0108 07/28/20 0502 08/01/20 0153  WBC 8.4 7.8 8.3 9.0  PROCALCITON 9.52 5.39  --   --    Nonoliguric AKI in the setting of CKD stage IIIa Baseline creatinine 1.5 to get up to 5.9, failure due to  ATN renal ultrasound bilateral renal cyst, managed by supportive care, seen by nephrology losartan has been discontinued continue to monitor closely Foley in place. Recent Labs  Lab 07/28/20 0502 07/29/20 0046 07/30/20 0046 07/31/20 0544 08/01/20 0153  BUN 42* 41* 42* 43* 46*  CREATININE 5.73* 5.79* 5.72* 5.91* 5.83*   Gross hematuria from Foley catheter site Foley catheter in place for urine retention BPH traumatic Foley insertion Urology following closely seen this morning defer to urology, currently no bleeding.  Hb slightly downtrending overall stable monitor Recent Labs  Lab 07/26/20 0109 07/27/20 0108 07/28/20 0502 08/01/20 0153  HGB 9.1* 9.8* 9.6* 8.5*  HCT 26.8* 29.0* 27.9* 25.6*   Acute on chronic systolic CHF EF 123456 , BNP elevated 4160-echo shows  7/24-EF 45 to 50% s,evere asymmetric left ventricular hypertrophy moderate MR monitor intake output . Chf cardioteam consulted. Net IO Since Admission: 509.54 mL [08/01/20 1333]  No intake or output data in the 24 hours ending 08/01/20 1333   CAD CABG 1996/ischemic cardiomyopathy Not much pleural fluid effusion on review by IR thoracentesis canceled 7/25 Chest pain resolved.  On aspirin statin beta-blocker.  Seen by cardiology and had a stable mild troponin elevation no significant delta.-In the setting of infectionLikely  T2DM: A1c 6.4, fairly stable blood sugar controlled continue current SSI Recent Labs  Lab 07/31/20 1131 07/31/20 1643 07/31/20 2056 08/01/20 0731 08/01/20 1217  GLUCAP 139* 184* 109* 99 188*    HTN: Blood pressure is stable losartan discontinued .  Metoprolol on hold.    Chronic back pain stable  Electrolyte imbalance with hypokalemia/hypophosphatemia, hypomagnesemia Potassium mag phosphorus stable.  Dementia with behavioral disturbance-stable.  Depression-mood stable.  Diet Order             DIET SOFT Room service appropriate? No; Fluid consistency: Thin  Diet effective now                   Patient's Body mass index is 25.81 kg/m. DVT prophylaxis: Place and maintain sequential compression device Start: 07/31/20 2314 Code Status:   Code Status: DNR  Family Communication: plan of care discussed with patient and his wife at bedside. Status is: Inpatient  Remains inpatient appropriate because:IV treatments appropriate due to intensity of illness or inability to take PO and Inpatient level of care appropriate due to severity of illness Dispo: The patient is from: Home              Anticipated d/c is to: Home              Patient currently is not medically stable to d/c.   Difficult to place patient No Unresulted Labs (From admission, onward)    None     Medications reviewed:  Scheduled Meds:  Chlorhexidine Gluconate Cloth  6 each Topical Daily   feeding supplement (NEPRO CARB STEADY)  237 mL Oral [id number]   folic acid  1 mg Oral Daily   insulin aspart  0-5 Units Subcutaneous QHS   insulin aspart  0-9 Units Subcutaneous TID WC   tamsulosin  0.4 mg Oral QPC supper   Continuous Infusions: Consultants:see note  Procedures:see note Antimicrobials: Anti-infectives (From admission, onward)    Start     Dose/Rate Route Frequency Ordered Stop   07/26/20 1800  ceFEPIme (MAXIPIME) 1 g in sodium chloride 0.9 % 100 mL IVPB        1 g 200 mL/hr over 30 Minutes Intravenous Every 24 hours 07/26/20 1110 07/29/20 0954   07/24/20 1800  piperacillin-tazobactam (ZOSYN) IVPB 2.25 g  Status:  Discontinued        2.25 g 100 mL/hr over 30 Minutes Intravenous Every 8 hours 07/24/20 1026 07/26/20 1109   07/24/20 1115  piperacillin-tazobactam (ZOSYN) IVPB 3.375 g        3.375 g 100 mL/hr over 30 Minutes Intravenous  Once 07/24/20 1022 07/24/20 1304   07/23/20 2200  doxycycline (VIBRA-TABS) tablet 100 mg  Status:  Discontinued        100 mg Oral Every 12 hours 07/23/20 1043 07/24/20 1022   07/23/20 2045  cefTRIAXone (ROCEPHIN) 2 g in sodium chloride 0.9 % 100 mL IVPB  Status:   Discontinued        2 g 200 mL/hr over 30 Minutes Intravenous Every 24 hours 07/22/20 2116 07/24/20 1022   07/23/20 1806  metroNIDAZOLE (FLAGYL) IVPB 500 mg  Status:  Discontinued        500 mg 100 mL/hr over 60 Minutes Intravenous Every 8 hours 07/23/20 1724 07/24/20 1022   07/23/20 0845  doxycycline (VIBRAMYCIN) 100 mg in sodium chloride 0.9 % 250 mL IVPB  Status:  Discontinued        100 mg 125 mL/hr over 120 Minutes Intravenous Every 12 hours 07/22/20 2116 07/23/20 1043   07/22/20 2045  cefTRIAXone (ROCEPHIN) 1 g in sodium chloride 0.9 % 100 mL IVPB        1 g 200 mL/hr over 30 Minutes Intravenous  Once 07/22/20 2037 07/22/20 2319   07/22/20 2045  doxycycline (VIBRAMYCIN) 100 mg in sodium chloride 0.9 % 250 mL IVPB        100 mg 125 mL/hr over 120 Minutes Intravenous  Once 07/22/20 2037 07/23/20 0143      Culture/Microbiology    Component Value Date/Time   SDES THROAT 01/15/2010 0734   SPECREQUEST CX ADDED ON [id number] AT [id number] 01/15/2010 0734   REPTSTATUS 01/17/2010 FINAL 01/15/2010 0734    Other culture-see note  Objective: Vitals: Today's Vitals   08/01/20 0515 08/01/20 0615 08/01/20 0733 08/01/20 1213  BP:   (!) 126/59 121/86  Pulse:   92 91  Resp:   18 19  Temp:   97.7 F (36.5 C) 98 F (36.7 C)  TempSrc:   Oral Oral  SpO2:   95% 95%  Weight:      PainSc: 7  5      No intake or output data in the 24 hours ending 08/01/20 1322 Filed Weights   07/29/20 0451 07/30/20 0600 08/01/20 0500  Weight: 81.4 kg 82.6 kg 81.6 kg   Weight change:   Intake/Output from previous day: 07/24 0701 - 07/25 0700 In: -  Out: 325 [Urine:325] Intake/Output this shift: No intake/output data recorded. Filed Weights   07/29/20 0451 07/30/20 0600 08/01/20 0500  Weight: 81.4 kg 82.6 kg 81.6 kg   Examination: General exam: AAO x3, pleasant,older than stated age, weak appearing. HEENT:Oral mucosa moist, Ear/Nose WNL grossly,dentition normal. Respiratory system: bilaterally  diminished, no use of accessory muscle, non tender. Cardiovascular system: S1 & S2 +,No JVD. Gastrointestinal system: Abdomen soft, NT,ND, BS+. Nervous System:Alert, awake, moving extremities Extremities: no edema, distal peripheral pulses palpable.  Skin: No rashes,no icterus. MSK: Normal muscle bulk,tone, power. Foley catheter in place.  Data Reviewed: I have personally reviewed following labs and imaging studies CBC: Recent Labs  Lab 07/26/20 0109 07/27/20 0108 07/28/20 0502 08/01/20 0153  WBC 8.4 7.8 8.3 9.0  NEUTROABS 6.0 4.7  --   --   HGB 9.1* 9.8* 9.6* 8.5*  HCT 26.8* 29.0* 27.9* 25.6*  MCV 101.1* 101.4* 101.1* 104.1*  PLT 142* 154 141* [id number]   Basic Metabolic Panel: Recent Labs  Lab 07/26/20 0109 07/27/20 0108 07/28/20 0502 07/29/20 0046 07/30/20 0046 07/30/20 0432 07/31/20 0544 08/01/20 0153  NA 137   < > 138 135 133*  --  136 133*  K 3.9   < > 3.2* 3.2* 4.4 4.0 3.9 4.7  CL 108   < > 99 98 99  --  100 102  CO2 19*   < > '25 27 24  '$ --  25 23  GLUCOSE 220*   < > 161* 128* 110*  --  98 110*  BUN 51*   < > 42* 41* 42*  --  43* 46*  CREATININE 5.30*   < > 5.73* 5.79* 5.72*  --  5.91* 5.83*  CALCIUM 7.5*   < > 7.5* 7.6* 7.6*  --  8.0* 7.9*  MG  --   --   --   --   --  1.6* 2.0 1.9  PHOS 3.1  --   --  2.9 3.1  --  3.4 3.5   < > = values in this interval not displayed.   GFR: Estimated Creatinine Clearance: 9.7 mL/min (A) (by C-G formula based on SCr of 5.83 mg/dL (H)). Liver Function Tests: Recent Labs  Lab 07/26/20 0109 07/27/20 0108 07/28/20 0502 07/29/20 0046 07/30/20 0046 07/31/20 0544  AST 34 24 19  --   --   --   ALT 62* 48* 32  --   --   --   ALKPHOS 133* 111 99  --   --   --  BILITOT 0.8 0.8 0.8  --   --   --   PROT 5.2* 5.3* 5.0*  --   --   --   ALBUMIN 2.6* 2.4* 2.2* 2.3* 2.2* 2.3*   No results for input(s): LIPASE, AMYLASE in the last 168 hours. No results for input(s): AMMONIA in the last 168 hours. Coagulation Profile: No results for  input(s): INR, PROTIME in the last 168 hours. Cardiac Enzymes: No results for input(s): CKTOTAL, CKMB, CKMBINDEX, TROPONINI in the last 168 hours. BNP (last 3 results) No results for input(s): PROBNP in the last 8760 hours. HbA1C: No results for input(s): HGBA1C in the last 72 hours. CBG: Recent Labs  Lab 07/31/20 1131 07/31/20 1643 07/31/20 2056 08/01/20 0731 08/01/20 1217  GLUCAP 139* 184* 109* 99 188*   Lipid Profile: No results for input(s): CHOL, HDL, LDLCALC, TRIG, CHOLHDL, LDLDIRECT in the last 72 hours. Thyroid Function Tests: No results for input(s): TSH, T4TOTAL, FREET4, T3FREE, THYROIDAB in the last 72 hours. Anemia Panel: No results for input(s): VITAMINB12, FOLATE, FERRITIN, TIBC, IRON, RETICCTPCT in the last 72 hours. Sepsis Labs: Recent Labs  Lab 07/26/20 0109 07/27/20 0108  PROCALCITON 9.52 5.39    No results found for this or any previous visit (from the past 240 hour(s)).   Radiology Studies: DG CHEST PORT 1 VIEW  Result Date: 07/31/2020 CLINICAL DATA:  Acute CHF EXAM: PORTABLE CHEST 1 VIEW COMPARISON:  July 24, 2020 FINDINGS: The cardiomediastinal silhouette is unchanged and enlarged in contour.Status post median sternotomy and CABG. Multiple clips project over the LEFT chest. Incomplete visualization of the RIGHT costophrenic angle. Unchanged LEFT-sided pleural blunting. No pneumothorax. Persistent and increased heterogeneous opacities of the LEFT retrocardiac region. No new opacities in the RIGHT lung. Visualized abdomen is unremarkable. Remote LEFT-sided rib fracture. IMPRESSION: 1. Mild increase in heterogeneous opacities in the LEFT lung base. Differential considerations include infection or atelectasis. Electronically Signed   By: Valentino Saxon MD   On: 07/31/2020 11:58   ECHOCARDIOGRAM COMPLETE  Result Date: 07/31/2020    ECHOCARDIOGRAM LIMITED REPORT   Patient Name:   Philip Kerr Date of Exam: 07/31/2020 Medical Rec #:  [id number]         Height:       70.0 in Accession #:    [id number]       Weight:       182.1 lb Date of Birth:  November 03, 1933        BSA:          2.006 m Patient Age:    89 years         BP:           125/83 mmHg Patient Gender: M                HR:           91 bpm. Exam Location:  Inpatient Procedure: 2D Echo, Cardiac Doppler and Color Doppler Indications:    CHF-Acute Systolic  History:        Patient has prior history of Echocardiogram examinations, most                 recent 09/11/2017. CAD, Prior CABG; Risk Factors:Hypertension and                 Diabetes. Ischemic cardiomyopathy. HFpEF. COVID-19.  Sonographer:    Clayton Lefort RDCS (AE) Referring Phys: [id number] [facility]  Sonographer Comments: No subcostal window. COVID-19 IMPRESSIONS  1. Left ventricular ejection fraction,  by estimation, is 45 to 50%. The left ventricle has mildly decreased function. Left ventricular endocardial border not optimally defined to evaluate regional wall motion. There is severe asymmetric left ventricular  hypertrophy of the septal segment. Left ventricular diastolic parameters are indeterminate.  2. Right ventricular systolic function is normal. The right ventricular size is normal. Tricuspid regurgitation signal is inadequate for assessing PA pressure.  3. Left atrial size was mildly dilated.  4. The mitral valve is abnormal. Moderate mitral valve regurgitation. No evidence of mitral stenosis.  5. Tricuspid valve regurgitation is mild to moderate.  6. The aortic valve is tricuspid. There is mild calcification of the aortic valve. There is mild thickening of the aortic valve.  7. Aortic dilatation noted. There is mild dilatation of the aortic root, measuring 40 mm. FINDINGS  Left Ventricle: Left ventricular ejection fraction, by estimation, is 45 to 50%. The left ventricle has mildly decreased function. Left ventricular endocardial border not optimally defined to evaluate regional wall motion. The left ventricular internal cavity size was normal in  size. There is severe asymmetric left ventricular hypertrophy of the septal segment. Left ventricular diastolic parameters are indeterminate. Right Ventricle: The right ventricular size is normal. No increase in right ventricular wall thickness. Right ventricular systolic function is normal. Tricuspid regurgitation signal is inadequate for assessing PA pressure. Left Atrium: Left atrial size was mildly dilated. Right Atrium: Right atrial size was normal in size. Mitral Valve: The mitral valve is abnormal. There is mild thickening of the mitral valve leaflet(s). There is mild calcification of the mitral valve leaflet(s). Mild mitral annular calcification. Moderate mitral valve regurgitation. No evidence of mitral  valve stenosis. Tricuspid Valve: The tricuspid valve is not well visualized. Tricuspid valve regurgitation is mild to moderate. No evidence of tricuspid stenosis. Aortic Valve: The aortic valve is tricuspid. There is mild calcification of the aortic valve. There is mild thickening of the aortic valve. There is mild aortic valve annular calcification. Aortic regurgitation PHT measures 499 msec. Aortic valve mean gradient measures 5.0 mmHg. Aortic valve peak gradient measures 8.8 mmHg. Aortic valve area, by VTI measures 3.39 cm. Pulmonic Valve: The pulmonic valve was not well visualized. Pulmonic valve regurgitation is mild. No evidence of pulmonic stenosis. Aorta: The aortic root is normal in size and structure and aortic dilatation noted. There is mild dilatation of the aortic root, measuring 40 mm. Venous: The inferior vena cava was not well visualized. LEFT VENTRICLE PLAX 2D LVIDd:         5.80 cm LVIDs:         5.20 cm LV PW:         1.20 cm LV IVS:        1.70 cm LVOT diam:     2.40 cm LV SV:         89 LV SV Index:   45 LVOT Area:     4.52 cm  RIGHT VENTRICLE RV Basal diam:  3.30 cm RV S prime:     12.00 cm/s TAPSE (M-mode): 1.3 cm LEFT ATRIUM           Index       RIGHT ATRIUM           Index LA  diam:      4.80 cm 2.39 cm/m  RA Area:     14.90 cm LA Vol (A4C): 70.5 ml 35.15 ml/m RA Volume:   35.30 ml  17.60 ml/m  AORTIC VALVE AV Area (Vmax):  3.52 cm AV Area (Vmean):   3.08 cm AV Area (VTI):     3.39 cm AV Vmax:           148.01 cm/s AV Vmean:          103.139 cm/s AV VTI:            0.264 m AV Peak Grad:      8.8 mmHg AV Mean Grad:      5.0 mmHg LVOT Vmax:         115.02 cm/s LVOT Vmean:        70.152 cm/s LVOT VTI:          0.197 m LVOT/AV VTI ratio: 0.75 AI PHT:            499 msec  AORTA Ao Root diam: 3.60 cm Ao Asc diam:  4.00 cm MR Peak grad: 86.9 mmHg MR Mean grad: 47.0 mmHg   SHUNTS MR Vmax:      466.00 cm/s Systemic VTI:  0.20 m MR Vmean:     316.0 cm/s  Systemic Diam: 2.40 cm Carlyle Dolly MD Electronically signed by Carlyle Dolly MD Signature Date/Time: 07/31/2020/2:13:11 PM    Final      LOS: 10 days   Antonieta Pert, MD [facility]  08/01/2020, 1:22 PM

## 2020-08-01 NOTE — Progress Notes (Signed)
Thoracentesis ordered by Dr. [place] Crane. Imaging reviewed by IR - scant pleural fluid identified. Per Dr. Maren Beach, [facility] to cancel order for thoracentesis. Order cancelled. Please call IR if patient situation changes.   Soyla Dryer, [place] [phone] 08/01/2020, 10:55 AM

## 2020-08-02 DIAGNOSIS — N179 Acute kidney failure, unspecified: Secondary | ICD-10-CM | POA: Diagnosis not present

## 2020-08-02 LAB — BASIC METABOLIC PANEL
Anion gap: 7 (ref 5–15)
BUN: 47 mg/dL — ABNORMAL HIGH (ref 8–23)
CO2: 26 mmol/L (ref 22–32)
Calcium: 8.2 mg/dL — ABNORMAL LOW (ref 8.9–10.3)
Chloride: 101 mmol/L (ref 98–111)
Creatinine, Ser: 5.82 mg/dL — ABNORMAL HIGH (ref 0.61–1.24)
GFR, Estimated: 9 mL/min — ABNORMAL LOW (ref >60)
Glucose, Bld: 102 mg/dL — ABNORMAL HIGH (ref 70–99)
Potassium: 4.4 mmol/L (ref 3.5–5.1)
Sodium: 134 mmol/L — ABNORMAL LOW (ref 135–145)

## 2020-08-02 LAB — CBC
HCT: 24.7 % — ABNORMAL LOW (ref 39.0–52.0)
Hemoglobin: 8.1 g/dL — ABNORMAL LOW (ref 13.0–17.0)
MCH: 34.6 pg — ABNORMAL HIGH (ref 26.0–34.0)
MCHC: 32.8 g/dL (ref 30.0–36.0)
MCV: 105.6 fL — ABNORMAL HIGH (ref 80.0–100.0)
Platelets: 281 10*3/uL (ref 150–400)
RBC: 2.34 MIL/uL — ABNORMAL LOW (ref 4.22–5.81)
RDW: 14.5 % (ref 11.5–15.5)
WBC: 11.5 10*3/uL — ABNORMAL HIGH (ref 4.0–10.5)
nRBC: 0 % (ref 0.0–0.2)

## 2020-08-02 LAB — GLUCOSE, CAPILLARY
Glucose-Capillary: 116 mg/dL — ABNORMAL HIGH (ref 70–99)
Glucose-Capillary: 130 mg/dL — ABNORMAL HIGH (ref 70–99)
Glucose-Capillary: 114 mg/dL — ABNORMAL HIGH (ref 70–99)
Glucose-Capillary: 115 mg/dL — ABNORMAL HIGH (ref 70–99)

## 2020-08-02 MED ORDER — ROSUVASTATIN CALCIUM 20 MG PO TABS
20.0000 mg | ORAL_TABLET | Freq: Every day | ORAL | Status: DC
  Administered 2020-08-02 – 2020-08-07 (×6): 20 mg via ORAL
  Filled 2020-08-02 (×6): qty 1

## 2020-08-02 MED ORDER — POLYVINYL ALCOHOL 1.4 % OP SOLN
1.0000 [drp] | OPHTHALMIC | Status: DC | PRN
  Administered 2020-08-03 – 2020-08-08 (×7): 1 [drp] via OPHTHALMIC
  Filled 2020-08-02 (×3): qty 15

## 2020-08-02 NOTE — Care Management Important Message (Signed)
Important Message  Patient Details  Name: Philip Kerr MRN: [id number] Date of Birth: Apr 19, 1933   Medicare Important Message Given:  Yes - Important Message mailed due to current National Emergency   Verbal consent obtained due to current National Emergency  Relationship to patient: Self Contact Name: Andre Call Date: 08/02/20  Time: 1248 Phone: [phone] Outcome: No Answer/Busy Important Message mailed to: Patient address on file    Delorse Lek 08/02/2020, 12:48 PM

## 2020-08-02 NOTE — Progress Notes (Signed)
OT Cancellation Note  Patient Details Name: Philip Kerr MRN: [id number] DOB: [birth year]   Cancelled Treatment:    Reason Eval/Treat Not Completed: Patient not medically ready (Discussed case with RN, patient still having quite a bit of pain from ongoing foley issues and pain with mobility. OT hold for now. OT to continue to follow.)   Philip Kerr, OTR/L Acute Rehabilitation Services Pager: [phone] Office: [phone]  Billee Balcerzak  C 08/02/2020, 8:36 AM

## 2020-08-02 NOTE — Progress Notes (Signed)
PROGRESS NOTE    Philip Kerr  [id number] DOB: [birth year] DOA: 07/22/2020 PCP: Biagio Borg, MD   Chief Complaint  Patient presents with   Chest Pain   Brief Narrative: 87 year old male with CAD, CABG 1996/ischemic cardiomyopathy, chronic systolic CHF EF 123456, 123456, HTN presented with chest pain, nausea on had chronic cough that worsened past 3 to 4 days prior to admission and developed chest pain while at rest.  In the ED minimal and stable elevated troponin without EKG changes so was sent home from ED but immediately returned due to worsening chest pain weakness and cough and work-up found transaminitis, possible bacterial pneumonia, COVID-19 positive.  Hospital course complicated by severe delirium that has now resolved, acute urine retention with Foley inserted by urology due to difficulty and worsening renal failure, was seen by nephrology and they have signed off with outpatient follow-up recommendation.  Subjective: Seen this morning patient is still having a lot of hematuria in the Foley but no visible bleeding from the penile meatus.  Assessment & Plan:  Left lower lobe pneumonia COVID-19 positive Most consistent with bacterial pneumonia procalcitonin peaked at 20 and downtrended to 5.3.  Initially on ceftriaxone/doxycycline changed to Zosyn>cefepime- completed.remains afebrile not hypoxic mild cough.  Completed 10 days of isolation.Due to elevated LFTs initially could not use remdesivir, has not been hypoxic and covifd infection seems to have improved.Chest x-ray 7/24 showed mild increase in heterogeneous opacity in the left lung base infection versus versus atelectasis-advised incentive spirometry ambulation. Recent Labs  Lab 07/27/20 0108 07/28/20 0502 08/01/20 0153 08/02/20 0636  WBC 7.8 8.3 9.0 11.5*  PROCALCITON 5.39  --   --   --    Nonoliguric AKI in the setting of CKD stage IIIa Baseline creatinine 1.5 peaked to 5.9 history of 5.8, Foley in place with  hematuria, monitor output, monitor BMP nephro advised outpatient follow-up. suspecto ATN, his renal ultrasound showed bilateral renal cyst. losartan has been discontinued Recent Labs  Lab 07/29/20 0046 07/30/20 0046 07/31/20 0544 08/01/20 0153 08/02/20 0636  BUN 41* 42* 43* 46* 47*  CREATININE 5.79* 5.72* 5.91* 5.83* 5.82*    Intake/Output Summary (Last 24 hours) at 08/02/2020 1041 Last data filed at 08/01/2020 1759 Gross per 24 hour  Intake --  Output 1350 ml  Net -1350 ml    Gross hematuria from Foley catheter site Foley catheter in place for urine retention BPH traumatic Foley insertion Urology following-no bleeding externally but he still having hematuria in the bag-s/p discontinued monitor WBC count platelet count hemoglobin slightly downtrending. Recent Labs  Lab 07/27/20 0108 07/28/20 0502 08/01/20 0153 08/02/20 0636  HGB 9.8* 9.6* 8.5* 8.1*  HCT 29.0* 27.9* 25.6* 24.7*   Acute on chronic systolic CHF EF 123456 , BNP elevated 4160-echo shows  7/24-EF 45 to 50% severe asymmetric left ventricular hypertrophy moderate MR monitor intake output .  CHF team input appreciated -not a candidate for ARB/ACE/ARN I due to AKI, BP too soft to add hydralazine/Imdur, Toprol-XL 12.5 mg started.  Diuretics on hold.  Net IO Since Admission: -840.46 mL [08/02/20 1036]   CAD/CABG 1996/ischemic cardiomyopathy Not much pleural fluid effusion on review by IR thoracentesis canceled 7/25 Chest pain resolved.  On beta-blocker.  Seen by cardiology and had a stable mild troponin elevation no significant delta.-In the setting of infection likely, aspirin held due to hematuria, resume statin.  T2DM: A1c 6.4, fairly stable blood sugar controlled continue current SSI Recent Labs  Lab 08/01/20 0731 08/01/20 1217 08/01/20 1608 08/01/20 2023  08/02/20 0825  GLUCAP 99 188* 138* 128* 116*     HTN: Blood pressure is stable losartan discontinued .  Back on metoprolol 7/25  Chronic back pain  stable  Electrolyte imbalance with hypokalemia/hypophosphatemia, hypomagnesemia Resolved.  Dementia with behavioral disturbance-stable. He is interactive and appropriate.    Depression- mood is stable. Diet Order             DIET SOFT Room service appropriate? No; Fluid consistency: Thin  Diet effective now                  Patient's Body mass index is 25.69 kg/m. DVT prophylaxis: Place and maintain sequential compression device Start: 07/31/20 2314 Code Status:   Code Status: DNR  Family Communication: plan of care discussed with patient and his wife at bedside. Status is: Inpatient  Remains inpatient appropriate because:IV treatments appropriate due to intensity of illness or inability to take PO and Inpatient level of care appropriate due to severity of illness Dispo: The patient is from: Home              Anticipated d/c is to: Home              Patient currently is not medically stable to d/c.   Difficult to place patient No Unresulted Labs (From admission, onward)    None     Medications reviewed:  Scheduled Meds:  Chlorhexidine Gluconate Cloth  6 each Topical Daily   feeding supplement (NEPRO CARB STEADY)  237 mL Oral [id number]   folic acid  1 mg Oral Daily   insulin aspart  0-5 Units Subcutaneous QHS   insulin aspart  0-9 Units Subcutaneous TID WC   metoprolol succinate  12.5 mg Oral Daily   tamsulosin  0.4 mg Oral QPC supper   Continuous Infusions: Consultants:see note  Procedures:see note Antimicrobials: Anti-infectives (From admission, onward)    Start     Dose/Rate Route Frequency Ordered Stop   07/26/20 1800  ceFEPIme (MAXIPIME) 1 g in sodium chloride 0.9 % 100 mL IVPB        1 g 200 mL/hr over 30 Minutes Intravenous Every 24 hours 07/26/20 1110 07/29/20 0954   07/24/20 1800  piperacillin-tazobactam (ZOSYN) IVPB 2.25 g  Status:  Discontinued        2.25 g 100 mL/hr over 30 Minutes Intravenous Every 8 hours 07/24/20 1026 07/26/20 1109   07/24/20 1115   piperacillin-tazobactam (ZOSYN) IVPB 3.375 g        3.375 g 100 mL/hr over 30 Minutes Intravenous  Once 07/24/20 1022 07/24/20 1304   07/23/20 2200  doxycycline (VIBRA-TABS) tablet 100 mg  Status:  Discontinued        100 mg Oral Every 12 hours 07/23/20 1043 07/24/20 1022   07/23/20 2045  cefTRIAXone (ROCEPHIN) 2 g in sodium chloride 0.9 % 100 mL IVPB  Status:  Discontinued        2 g 200 mL/hr over 30 Minutes Intravenous Every 24 hours 07/22/20 2116 07/24/20 1022   07/23/20 1806  metroNIDAZOLE (FLAGYL) IVPB 500 mg  Status:  Discontinued        500 mg 100 mL/hr over 60 Minutes Intravenous Every 8 hours 07/23/20 1724 07/24/20 1022   07/23/20 0845  doxycycline (VIBRAMYCIN) 100 mg in sodium chloride 0.9 % 250 mL IVPB  Status:  Discontinued        100 mg 125 mL/hr over 120 Minutes Intravenous Every 12 hours 07/22/20 2116 07/23/20 1043   07/22/20 2045  cefTRIAXone (  ROCEPHIN) 1 g in sodium chloride 0.9 % 100 mL IVPB        1 g 200 mL/hr over 30 Minutes Intravenous  Once 07/22/20 2037 07/22/20 2319   07/22/20 2045  doxycycline (VIBRAMYCIN) 100 mg in sodium chloride 0.9 % 250 mL IVPB        100 mg 125 mL/hr over 120 Minutes Intravenous  Once 07/22/20 2037 07/23/20 0143      Culture/Microbiology    Component Value Date/Time   SDES THROAT 01/15/2010 [place] ADDED ON [id number] AT 0357 01/15/2010 0734   REPTSTATUS 01/17/2010 FINAL 01/15/2010 0734    Other culture-see note  Objective: Vitals: Today's Vitals   08/02/20 0334 08/02/20 0633 08/02/20 0718 08/02/20 0823  BP: 128/65   120/72  Pulse: 83 82  81  Resp: 18 (!) 21  18  Temp: 98.2 F (36.8 C)   98.7 F (37.1 C)  TempSrc: Oral   Oral  SpO2: 94% 95%  95%  Weight:      PainSc: Asleep 5  Asleep     Intake/Output Summary (Last 24 hours) at 08/02/2020 1036 Last data filed at 08/01/2020 1759 Gross per 24 hour  Intake --  Output 1350 ml  Net -1350 ml   Filed Weights   07/30/20 0600 08/01/20 0500 08/02/20 0210  Weight:  82.6 kg 81.6 kg 81.2 kg   Weight change: -0.4 kg  Intake/Output from previous day: 07/25 0701 - 07/26 0700 In: -  Out: 1350 [Urine:1350] Intake/Output this shift: No intake/output data recorded. Filed Weights   07/30/20 0600 08/01/20 0500 08/02/20 0210  Weight: 82.6 kg 81.6 kg 81.2 kg   Examination: General exam: AAOx 3, pleasant, older than stated age, weak appearing. HEENT:Oral mucosa moist, Ear/Nose WNL grossly, dentition normal. Respiratory system: bilaterally diminished,  no use of accessory muscle Cardiovascular system: S1 & S2 +, No JVD,. Gastrointestinal system: Abdomen soft,NT,ND, BS+ Nervous System:Alert, awake, moving extremities and grossly nonfocal Extremities: no edema, distal peripheral pulses palpable.  Skin: No rashes,no icterus. MSK: Normal muscle bulk,tone, power  Foley catheter in place with hematuria  Data Reviewed: I have personally reviewed following labs and imaging studies CBC: Recent Labs  Lab 07/27/20 0108 07/28/20 0502 08/01/20 0153 08/02/20 0636  WBC 7.8 8.3 9.0 11.5*  NEUTROABS 4.7  --   --   --   HGB 9.8* 9.6* 8.5* 8.1*  HCT 29.0* 27.9* 25.6* 24.7*  MCV 101.4* 101.1* 104.1* 105.6*  PLT 154 141* 237 [id number]    Basic Metabolic Panel: Recent Labs  Lab 07/29/20 0046 07/30/20 0046 07/30/20 0432 07/31/20 0544 08/01/20 0153 08/02/20 0636  NA 135 133*  --  136 133* 134*  K 3.2* 4.4 4.0 3.9 4.7 4.4  CL 98 99  --  100 102 101  CO2 27 24  --  '25 23 26  '$ GLUCOSE 128* 110*  --  98 110* 102*  BUN 41* 42*  --  43* 46* 47*  CREATININE 5.79* 5.72*  --  5.91* 5.83* 5.82*  CALCIUM 7.6* 7.6*  --  8.0* 7.9* 8.2*  MG  --   --  1.6* 2.0 1.9  --   PHOS 2.9 3.1  --  3.4 3.5  --     GFR: Estimated Creatinine Clearance: 9.8 mL/min (A) (by C-G formula based on SCr of 5.82 mg/dL (H)). Liver Function Tests: Recent Labs  Lab 07/27/20 0108 07/28/20 0502 07/29/20 0046 07/30/20 0046 07/31/20 0544  AST 24 19  --   --   --  ALT 48* 32  --   --   --    ALKPHOS 111 99  --   --   --   BILITOT 0.8 0.8  --   --   --   PROT 5.3* 5.0*  --   --   --   ALBUMIN 2.4* 2.2* 2.3* 2.2* 2.3*    No results for input(s): LIPASE, AMYLASE in the last 168 hours. No results for input(s): AMMONIA in the last 168 hours. Coagulation Profile: No results for input(s): INR, PROTIME in the last 168 hours. Cardiac Enzymes: No results for input(s): CKTOTAL, CKMB, CKMBINDEX, TROPONINI in the last 168 hours. BNP (last 3 results) No results for input(s): PROBNP in the last 8760 hours. HbA1C: No results for input(s): HGBA1C in the last 72 hours. CBG: Recent Labs  Lab 08/01/20 0731 08/01/20 1217 08/01/20 1608 08/01/20 2023 08/02/20 0825  GLUCAP 99 188* 138* 128* 116*    Lipid Profile: No results for input(s): CHOL, HDL, LDLCALC, TRIG, CHOLHDL, LDLDIRECT in the last 72 hours. Thyroid Function Tests: No results for input(s): TSH, T4TOTAL, FREET4, T3FREE, THYROIDAB in the last 72 hours. Anemia Panel: No results for input(s): VITAMINB12, FOLATE, FERRITIN, TIBC, IRON, RETICCTPCT in the last 72 hours. Sepsis Labs: Recent Labs  Lab 07/27/20 0108  PROCALCITON 5.39     No results found for this or any previous visit (from the past 240 hour(s)).   Radiology Studies: DG CHEST PORT 1 VIEW  Result Date: 07/31/2020 CLINICAL DATA:  Acute CHF EXAM: PORTABLE CHEST 1 VIEW COMPARISON:  July 24, 2020 FINDINGS: The cardiomediastinal silhouette is unchanged and enlarged in contour.Status post median sternotomy and CABG. Multiple clips project over the LEFT chest. Incomplete visualization of the RIGHT costophrenic angle. Unchanged LEFT-sided pleural blunting. No pneumothorax. Persistent and increased heterogeneous opacities of the LEFT retrocardiac region. No new opacities in the RIGHT lung. Visualized abdomen is unremarkable. Remote LEFT-sided rib fracture. IMPRESSION: 1. Mild increase in heterogeneous opacities in the LEFT lung base. Differential considerations include  infection or atelectasis. Electronically Signed   By: Valentino Saxon MD   On: 07/31/2020 11:58   ECHOCARDIOGRAM COMPLETE  Result Date: 07/31/2020    ECHOCARDIOGRAM LIMITED REPORT   Patient Name:   Philip Kerr Date of Exam: 07/31/2020 Medical Rec #:  [id number]        Height:       70.0 in Accession #:    [id number]       Weight:       182.1 lb Date of Birth:  [birth year]        BSA:          2.006 m Patient Age:    17 years         BP:           125/83 mmHg Patient Gender: M                HR:           91 bpm. Exam Location:  Inpatient Procedure: 2D Echo, Cardiac Doppler and Color Doppler Indications:    CHF-Acute Systolic  History:        Patient has prior history of Echocardiogram examinations, most                 recent 09/11/2017. CAD, Prior CABG; Risk Factors:Hypertension and                 Diabetes. Ischemic cardiomyopathy. HFpEF. COVID-19.  Sonographer:    Arnell Sieving  Danelle Berry (AE) Referring Phys: [id number] Kayleen Memos  Sonographer Comments: No subcostal window. COVID-19 IMPRESSIONS  1. Left ventricular ejection fraction, by estimation, is 45 to 50%. The left ventricle has mildly decreased function. Left ventricular endocardial border not optimally defined to evaluate regional wall motion. There is severe asymmetric left ventricular  hypertrophy of the septal segment. Left ventricular diastolic parameters are indeterminate.  2. Right ventricular systolic function is normal. The right ventricular size is normal. Tricuspid regurgitation signal is inadequate for assessing PA pressure.  3. Left atrial size was mildly dilated.  4. The mitral valve is abnormal. Moderate mitral valve regurgitation. No evidence of mitral stenosis.  5. Tricuspid valve regurgitation is mild to moderate.  6. The aortic valve is tricuspid. There is mild calcification of the aortic valve. There is mild thickening of the aortic valve.  7. Aortic dilatation noted. There is mild dilatation of the aortic root, measuring 40 mm. FINDINGS   Left Ventricle: Left ventricular ejection fraction, by estimation, is 45 to 50%. The left ventricle has mildly decreased function. Left ventricular endocardial border not optimally defined to evaluate regional wall motion. The left ventricular internal cavity size was normal in size. There is severe asymmetric left ventricular hypertrophy of the septal segment. Left ventricular diastolic parameters are indeterminate. Right Ventricle: The right ventricular size is normal. No increase in right ventricular wall thickness. Right ventricular systolic function is normal. Tricuspid regurgitation signal is inadequate for assessing PA pressure. Left Atrium: Left atrial size was mildly dilated. Right Atrium: Right atrial size was normal in size. Mitral Valve: The mitral valve is abnormal. There is mild thickening of the mitral valve leaflet(s). There is mild calcification of the mitral valve leaflet(s). Mild mitral annular calcification. Moderate mitral valve regurgitation. No evidence of mitral  valve stenosis. Tricuspid Valve: The tricuspid valve is not well visualized. Tricuspid valve regurgitation is mild to moderate. No evidence of tricuspid stenosis. Aortic Valve: The aortic valve is tricuspid. There is mild calcification of the aortic valve. There is mild thickening of the aortic valve. There is mild aortic valve annular calcification. Aortic regurgitation PHT measures 499 msec. Aortic valve mean gradient measures 5.0 mmHg. Aortic valve peak gradient measures 8.8 mmHg. Aortic valve area, by VTI measures 3.39 cm. Pulmonic Valve: The pulmonic valve was not well visualized. Pulmonic valve regurgitation is mild. No evidence of pulmonic stenosis. Aorta: The aortic root is normal in size and structure and aortic dilatation noted. There is mild dilatation of the aortic root, measuring 40 mm. Venous: The inferior vena cava was not well visualized. LEFT VENTRICLE PLAX 2D LVIDd:         5.80 cm LVIDs:         5.20 cm LV PW:          1.20 cm LV IVS:        1.70 cm LVOT diam:     2.40 cm LV SV:         89 LV SV Index:   45 LVOT Area:     4.52 cm  RIGHT VENTRICLE RV Basal diam:  3.30 cm RV S prime:     12.00 cm/s TAPSE (M-mode): 1.3 cm LEFT ATRIUM           Index       RIGHT ATRIUM           Index LA diam:      4.80 cm 2.39 cm/m  RA Area:     14.90 cm LA Vol (  A4C): 70.5 ml 35.15 ml/m RA Volume:   35.30 ml  17.60 ml/m  AORTIC VALVE AV Area (Vmax):    3.52 cm AV Area (Vmean):   3.08 cm AV Area (VTI):     3.39 cm AV Vmax:           148.01 cm/s AV Vmean:          103.139 cm/s AV VTI:            0.264 m AV Peak Grad:      8.8 mmHg AV Mean Grad:      5.0 mmHg LVOT Vmax:         115.02 cm/s LVOT Vmean:        70.152 cm/s LVOT VTI:          0.197 m LVOT/AV VTI ratio: 0.75 AI PHT:            499 msec  AORTA Ao Root diam: 3.60 cm Ao Asc diam:  4.00 cm MR Peak grad: 86.9 mmHg MR Mean grad: 47.0 mmHg   SHUNTS MR Vmax:      466.00 cm/s Systemic VTI:  0.20 m MR Vmean:     316.0 cm/s  Systemic Diam: 2.40 cm Carlyle Dolly MD Electronically signed by Carlyle Dolly MD Signature Date/Time: 07/31/2020/2:13:11 PM    Final      LOS: 11 days   Antonieta Pert, MD [facility]  08/02/2020, 10:36 AM

## 2020-08-02 NOTE — Progress Notes (Signed)
PT Cancellation Note  Patient Details Name: Philip Kerr MRN: [id number] DOB: 10/17/1933   Cancelled Treatment:    Reason Eval/Treat Not Completed: Other (comment) discussed case with RN, patient still having quite a bit of pain from ongoing foley issues and likely to have difficulty tolerating mobility. In agreement to continue holding off for now- will continue efforts as appropriate.   [name], DPT, PN1   Supplemental Physical Therapist [facility]    Pager [phone] Acute Rehab Office [phone]

## 2020-08-03 DIAGNOSIS — N179 Acute kidney failure, unspecified: Secondary | ICD-10-CM | POA: Diagnosis not present

## 2020-08-03 LAB — GLUCOSE, CAPILLARY
Glucose-Capillary: 116 mg/dL — ABNORMAL HIGH (ref 70–99)
Glucose-Capillary: 131 mg/dL — ABNORMAL HIGH (ref 70–99)
Glucose-Capillary: 142 mg/dL — ABNORMAL HIGH (ref 70–99)
Glucose-Capillary: 162 mg/dL — ABNORMAL HIGH (ref 70–99)

## 2020-08-03 LAB — CBC
HCT: 22.4 % — ABNORMAL LOW (ref 39.0–52.0)
Hemoglobin: 7.2 g/dL — ABNORMAL LOW (ref 13.0–17.0)
MCH: 34.3 pg — ABNORMAL HIGH (ref 26.0–34.0)
MCHC: 32.1 g/dL (ref 30.0–36.0)
MCV: 106.7 fL — ABNORMAL HIGH (ref 80.0–100.0)
Platelets: 275 10*3/uL (ref 150–400)
RBC: 2.1 MIL/uL — ABNORMAL LOW (ref 4.22–5.81)
RDW: 14.2 % (ref 11.5–15.5)
WBC: 10.1 10*3/uL (ref 4.0–10.5)
nRBC: 0 % (ref 0.0–0.2)

## 2020-08-03 LAB — BASIC METABOLIC PANEL
Anion gap: 8 (ref 5–15)
BUN: 47 mg/dL — ABNORMAL HIGH (ref 8–23)
CO2: 23 mmol/L (ref 22–32)
Calcium: 8.1 mg/dL — ABNORMAL LOW (ref 8.9–10.3)
Chloride: 102 mmol/L (ref 98–111)
Creatinine, Ser: 5.75 mg/dL — ABNORMAL HIGH (ref 0.61–1.24)
GFR, Estimated: 9 mL/min — ABNORMAL LOW (ref >60)
Glucose, Bld: 106 mg/dL — ABNORMAL HIGH (ref 70–99)
Potassium: 4.4 mmol/L (ref 3.5–5.1)
Sodium: 133 mmol/L — ABNORMAL LOW (ref 135–145)

## 2020-08-03 LAB — HEMOGLOBIN AND HEMATOCRIT, BLOOD
HCT: 27 % — ABNORMAL LOW (ref 39.0–52.0)
Hemoglobin: 8.6 g/dL — ABNORMAL LOW (ref 13.0–17.0)

## 2020-08-03 LAB — ABO/RH: ABO/RH(D): A POS

## 2020-08-03 LAB — PREPARE RBC (CROSSMATCH)

## 2020-08-03 LAB — MAGNESIUM: Magnesium: 2 mg/dL (ref 1.7–2.4)

## 2020-08-03 MED ORDER — SODIUM CHLORIDE 0.9% IV SOLUTION: Freq: Once | INTRAVENOUS | Status: AC

## 2020-08-03 NOTE — Progress Notes (Signed)
PROGRESS NOTE    Philip Kerr  [id number] DOB: 23-May-1933 DOA: 07/22/2020 PCP: Biagio Borg, MD   Chief Complaint  Patient presents with   Chest Pain   Brief Narrative: 87 year old male with CAD, CABG 1996/ischemic cardiomyopathy, chronic systolic CHF EF 123456, 123456, HTN presented with chest pain, nausea on had chronic cough that worsened past 3 to 4 days prior to admission and developed chest pain while at rest.  In the ED minimal and stable elevated troponin without EKG changes so was sent home from ED but immediately returned due to worsening chest pain weakness and cough and work-up found transaminitis, possible bacterial pneumonia, COVID-19 positive.  Hospital course complicated by severe delirium that has now resolved, acute urine retention with Foley inserted by urology due to difficulty and worsening renal failure, was seen by nephrology and they have signed off with outpatient follow-up recommendation. Patient started having hematuria from the penile Foley heparin and aspirin were discontinued-urology has been following. Taken off covid isolation 08/02/20  Subjective: Seen and examined this morning.  Wife at the bedside.  No other new complaints but he still having hematuria in the FOLEY, and no external bleeding Hemoglobin continues to downtrend w. Hematuria  Assessment & Plan:  Left lower lobe BACTERIAL pneumonia COVID-19 positive procalcitonin peaked at 20 > downtrended to 5.3, consistent with bacterial pneumonia in the setting of COVID infection.  Completed antibiotics initially on ceftriaxone/doxycycline changed to Zosyn>cefepime-.  Overall clinically stable, afebrile on RA.Due to elevated LFTs initially could not use remdesivir, has not been hypoxic and covifd infection seems to have improved.Chest x-ray 7/24 showed mild increase in heterogeneous opacity in the left lung base infection versus versus atelectasis-advised incentive spirometry ambulation. Recent Labs  Lab  07/28/20 0502 08/01/20 0153 08/02/20 0636 08/03/20 0041  WBC 8.3 9.0 11.5* 10.1   Nonoliguric AKI in the setting of CKD stage IIIa Baseline creatinine 1.5 peaked to 5.9 > downtrending to 5.7 . keep Foley in place having hematuria.Suspecto ATN, his renal ultrasound showed bilateral renal cyst. losartan has been discontinued.Monitor BMP  daily while here. Nephro advised outpatient follow-up.  Recent Labs  Lab 07/30/20 0046 07/31/20 0544 08/01/20 0153 08/02/20 0636 08/03/20 0041  BUN 42* 43* 46* 47* 47*  CREATININE 5.72* 5.91* 5.83* 5.82* 5.75*    Intake/Output Summary (Last 24 hours) at 08/03/2020 0729 Last data filed at 08/03/2020 0557 Gross per 24 hour  Intake --  Output 1800 ml  Net -1800 ml   Gross hematuria from Foley catheter site Foley catheter in place for urine retention BPH w/ traumatic Foley insertion Acute blood loss anemia due to hematuria No bleeding externally but still with hematuria  in foley.  I have sent message to Dr. Ladona Mow paged her.  hemoglobin is downtrending, this with the patient and his wife and agree for 1 unit PRBC transfusion.Will check serial H&H and  cont to hold Asa, heparin . Recent Labs  Lab 07/28/20 0502 08/01/20 0153 08/02/20 0636 08/03/20 0041  HGB 9.6* 8.5* 8.1* 7.2*  HCT 27.9* 25.6* 24.7* 22.4*   Acute on chronic systolic CHF EF 123456 , BNP elevated 4160-echo shows  7/24-EF 45 to 50% severe asymmetric left ventricular hypertrophy moderate MR monitor intake output .  CHF team input appreciated -not a candidate for ARB/ACE/ARN I due to AKI, BP too soft to add hydralazine/Imdur, Toprol-XL 12.5 mg started.  Diuretics on hold. Transfuse prbc slowly lasix if any shortness of breath. Net IO Since Admission: -2,640.46 mL [08/03/20 0729]   CAD/CABG  1996/ischemic cardiomyopathy Not much pleural fluid effusion on review by IR thoracentesis canceled 7/25 Chest pain resolved.Seen by cardiology and had a stable mild troponin elevation no significant  delta.-In the setting of infection likely.Aspirin held due to hematuria, resume statin.  T2DM: A1c 6.4, blood sugar well controlled on sliding scale. Recent Labs  Lab 08/01/20 2023 08/02/20 0825 08/02/20 1224 08/02/20 1612 08/02/20 2115  GLUCAP 128* 116* 130* 114* 115*   HTN:BP is stable, losartan discontinued 2/2 AKI. Back on metoprolol 7/25 Mild hyponatremia monitor Chronic back pain:stable  Electrolyte imbalance with hypokalemia/hypophosphatemia, hypomagnesemia Electrolytes remained stable.  Dementia with behavioral disturbance-stable. He is interactive and appropriate.    Depression- mood is stable. Diet Order             DIET SOFT Room service appropriate? No; Fluid consistency: Thin  Diet effective now                  Patient's Body mass index is 25.69 kg/m. DVT prophylaxis: Place and maintain sequential compression device Start: 07/31/20 2314 Code Status:   Code Status: DNR  Family Communication: plan of care discussed with patient and his wife at the bedside, in agreement.  Status is: Inpatient Remains inpatient appropriate because:IV treatments appropriate due to intensity of illness or inability to take PO and Inpatient level of care appropriate due to severity of illness Dispo: The patient is from: Home              Anticipated d/c is to: Home              Patient currently is not medically stable to d/c.   Difficult to place patient No Unresulted Labs (From admission, onward)     Start     Ordered   08/03/20 1300  Hemoglobin and hematocrit, blood  Now then every 8 hours,   R (with TIMED occurrences)     Question:  Specimen collection method  Answer:  Lab=Lab collect   08/03/20 0729   08/03/20 0730  Type and screen [place]  Once,   R       Comments: [place]    08/03/20 0729   08/03/20 [id number]  Basic metabolic panel  Daily,   R     Question:  Specimen collection method  Answer:  Lab=Lab collect   08/02/20 1047    08/03/20 0500  CBC  Daily,   R     Question:  Specimen collection method  Answer:  Lab=Lab collect   08/02/20 1047          Medications reviewed:  Scheduled Meds:  Chlorhexidine Gluconate Cloth  6 each Topical Daily   feeding supplement (NEPRO CARB STEADY)  237 mL Oral [id number]   folic acid  1 mg Oral Daily   insulin aspart  0-5 Units Subcutaneous QHS   insulin aspart  0-9 Units Subcutaneous TID WC   metoprolol succinate  12.5 mg Oral Daily   rosuvastatin  20 mg Oral QHS   tamsulosin  0.4 mg Oral QPC supper   Continuous Infusions: Consultants:see note  Procedures:see note Antimicrobials: Anti-infectives (From admission, onward)    Start     Dose/Rate Route Frequency Ordered Stop   07/26/20 1800  ceFEPIme (MAXIPIME) 1 g in sodium chloride 0.9 % 100 mL IVPB        1 g 200 mL/hr over 30 Minutes Intravenous Every 24 hours 07/26/20 1110 07/29/20 0954   07/24/20 1800  piperacillin-tazobactam (ZOSYN) IVPB 2.25 g  Status:  Discontinued        2.25 g 100 mL/hr over 30 Minutes Intravenous Every 8 hours 07/24/20 1026 07/26/20 1109   07/24/20 1115  piperacillin-tazobactam (ZOSYN) IVPB 3.375 g        3.375 g 100 mL/hr over 30 Minutes Intravenous  Once 07/24/20 1022 07/24/20 1304   07/23/20 2200  doxycycline (VIBRA-TABS) tablet 100 mg  Status:  Discontinued        100 mg Oral Every 12 hours 07/23/20 1043 07/24/20 1022   07/23/20 2045  cefTRIAXone (ROCEPHIN) 2 g in sodium chloride 0.9 % 100 mL IVPB  Status:  Discontinued        2 g 200 mL/hr over 30 Minutes Intravenous Every 24 hours 07/22/20 2116 07/24/20 1022   07/23/20 1806  metroNIDAZOLE (FLAGYL) IVPB 500 mg  Status:  Discontinued        500 mg 100 mL/hr over 60 Minutes Intravenous Every 8 hours 07/23/20 1724 07/24/20 1022   07/23/20 0845  doxycycline (VIBRAMYCIN) 100 mg in sodium chloride 0.9 % 250 mL IVPB  Status:  Discontinued        100 mg 125 mL/hr over 120 Minutes Intravenous Every 12 hours 07/22/20 2116 07/23/20 1043    07/22/20 2045  cefTRIAXone (ROCEPHIN) 1 g in sodium chloride 0.9 % 100 mL IVPB        1 g 200 mL/hr over 30 Minutes Intravenous  Once 07/22/20 2037 07/22/20 2319   07/22/20 2045  doxycycline (VIBRAMYCIN) 100 mg in sodium chloride 0.9 % 250 mL IVPB        100 mg 125 mL/hr over 120 Minutes Intravenous  Once 07/22/20 2037 07/23/20 0143      Culture/Microbiology    Component Value Date/Time   SDES THROAT 01/15/2010 [place] ADDED ON [id number] AT [place] 01/15/2010 0734   REPTSTATUS 01/17/2010 FINAL 01/15/2010 0734    Other culture-see note  Objective: Vitals: Today's Vitals   08/02/20 1607 08/02/20 2014 08/03/20 0016 08/03/20 0552  BP: 126/60 (!) 126/59 123/69 (!) 122/57  Pulse: 77 79 72 78  Resp: 20 19 (!) 21 19  Temp: 98.2 F (36.8 C) 98.1 F (36.7 C) 98.2 F (36.8 C) 98.3 F (36.8 C)  TempSrc: Oral Oral Oral Oral  SpO2: 97% 96% 94% 97%  Weight:      PainSc:  0-No pain      Intake/Output Summary (Last 24 hours) at 08/03/2020 0729 Last data filed at 08/03/2020 0557 Gross per 24 hour  Intake --  Output 1800 ml  Net -1800 ml    Filed Weights   07/30/20 0600 08/01/20 0500 08/02/20 0210  Weight: 82.6 kg 81.6 kg 81.2 kg   Weight change:   Intake/Output from previous day: 07/26 0701 - 07/27 0700 In: -  Out: 1800 [Urine:1800] Intake/Output this shift: No intake/output data recorded. Filed Weights   07/30/20 0600 08/01/20 0500 08/02/20 0210  Weight: 82.6 kg 81.6 kg 81.2 kg   Examination: General exam: AAOx at baseline, elderly, on RA, comfortable.   HEENT:Oral mucosa moist, Ear/Nose WNL grossly, dentition normal. Respiratory system: bilaterally clear breath sounds no added sounds, no use of accessory muscle Cardiovascular system: S1 & S2 +, No JVD,. Gastrointestinal system: Abdomen soft,NT,ND, BS+ Nervous System:Alert, awake, moving extremities and grossly nonfocal Extremities: no edema, distal peripheral pulses palpable.  Skin: No rashes,no icterus. MSK:  Normal muscle bulk,tone, power  Foley catheter in place w/ hematuria.  Data Reviewed: I have personally reviewed following labs and imaging studies  CBC: Recent Labs  Lab 07/28/20 0502 08/01/20 0153 08/02/20 0636 08/03/20 0041  WBC 8.3 9.0 11.5* 10.1  HGB 9.6* 8.5* 8.1* 7.2*  HCT 27.9* 25.6* 24.7* 22.4*  MCV 101.1* 104.1* 105.6* 106.7*  PLT 141* 237 281 [id number]    Basic Metabolic Panel: Recent Labs  Lab 07/29/20 0046 07/30/20 0046 07/30/20 0432 07/31/20 0544 08/01/20 0153 08/02/20 0636 08/03/20 0041  NA 135 133*  --  136 133* 134* 133*  K 3.2* 4.4 4.0 3.9 4.7 4.4 4.4  CL 98 99  --  100 102 101 102  CO2 27 24  --  '25 23 26 23  '$ GLUCOSE 128* 110*  --  98 110* 102* 106*  BUN 41* 42*  --  43* 46* 47* 47*  CREATININE 5.79* 5.72*  --  5.91* 5.83* 5.82* 5.75*  CALCIUM 7.6* 7.6*  --  8.0* 7.9* 8.2* 8.1*  MG  --   --  1.6* 2.0 1.9  --   --   PHOS 2.9 3.1  --  3.4 3.5  --   --     GFR: Estimated Creatinine Clearance: 9.9 mL/min (A) (by C-G formula based on SCr of 5.75 mg/dL (H)). Liver Function Tests: Recent Labs  Lab 07/28/20 0502 07/29/20 0046 07/30/20 0046 07/31/20 0544  AST 19  --   --   --   ALT 32  --   --   --   ALKPHOS 99  --   --   --   BILITOT 0.8  --   --   --   PROT 5.0*  --   --   --   ALBUMIN 2.2* 2.3* 2.2* 2.3*    No results for input(s): LIPASE, AMYLASE in the last 168 hours. No results for input(s): AMMONIA in the last 168 hours. Coagulation Profile: No results for input(s): INR, PROTIME in the last 168 hours. Cardiac Enzymes: No results for input(s): CKTOTAL, CKMB, CKMBINDEX, TROPONINI in the last 168 hours. BNP (last 3 results) No results for input(s): PROBNP in the last 8760 hours. HbA1C: No results for input(s): HGBA1C in the last 72 hours. CBG: Recent Labs  Lab 08/01/20 2023 08/02/20 0825 08/02/20 1224 08/02/20 1612 08/02/20 2115  GLUCAP 128* 116* 130* 114* 115*    Lipid Profile: No results for input(s): CHOL, HDL, LDLCALC, TRIG,  CHOLHDL, LDLDIRECT in the last 72 hours. Thyroid Function Tests: No results for input(s): TSH, T4TOTAL, FREET4, T3FREE, THYROIDAB in the last 72 hours. Anemia Panel: No results for input(s): VITAMINB12, FOLATE, FERRITIN, TIBC, IRON, RETICCTPCT in the last 72 hours. Sepsis Labs: No results for input(s): PROCALCITON, LATICACIDVEN in the last 168 hours.   No results found for this or any previous visit (from the past 240 hour(s)).   Radiology Studies: No results found.   LOS: 12 days   Antonieta Pert, MD [facility]  08/03/2020, 7:29 AM

## 2020-08-03 NOTE — Progress Notes (Signed)
   RN notified cardiology of an 8 second pause on telemetry this afternoon. Telemetry reviewed with a 5 second episode of high grade AV block followed by an additional 2.5 second pause. Called the patient over the phone. Wife answered and describes an episode of dizziness and brief LOC with associated seizure like activity. Presentation c/f Stokes-Adams syndrome. He was restarted on metoprolol 2 days ago - agree with holding at this time. Would continue to monitor on telemetry for recurrent pauses.   Will round on patient tomorrow. On chart review, patient has not been interested in aggressive cardiovascular care so unlikely he would be interested in a PPM if this reoccurs. Will readdress tomorrow. Plan discussed with Dr. Sallyanne Kuster.  Abigail Butts, PA-C 08/03/20; 2:32 PM

## 2020-08-03 NOTE — Progress Notes (Signed)
PT Cancellation Note  Patient Details Name: Philip Kerr MRN: [id number] DOB: 04-13-1933   Cancelled Treatment:    Reason Eval/Treat Not Completed: Other (comment) pt's spouse states the doctor said absolute bedrest with ongoing foley issues. No orders or statements in MD notes present/confirming this. RN clarifying with MD- I will plan on checking back in later today if time/schedule allow and if patient is appropriate.   [name], DPT, PN1   Supplemental Physical Therapist [facility]    Pager [phone] Acute Rehab Office [phone]

## 2020-08-03 NOTE — Progress Notes (Addendum)
Received call from telemetry technician about a pause (about 8.2 seconds) followed by a large PVC. I checked on the patient, and he was resting calmly in bed, in NSR on the monitor.  After speaking with his wife, she told me that during this time, he was awoken out of his sleep and was convulsing with his eyes rolled back. She said he appeared to go unconscious afterward for a minute or so. She then tapped his arm, and he looked at her, and told her that he felt funny in the head. She mentions that although the patient has not had a seizure in the past, she has seen others have seizures and this is what she thinks it was.   His wife denies that anything similar has happened in the past. The patient is now back to his previous mentation and does not recollect the incident. He is receiving a unit of PRBCs right now, but VS are stable.  I have notified Dr.KC of the incident.

## 2020-08-03 NOTE — Consult Note (Signed)
I have been asked to see the patient by Dr. Antonieta Pert for evaluation and management of persistent hematuria and acute blood loss anemia.  History of present illness: 87 year old man admitted for COVID-pneumonia found to have bloody urine output.  CT scan showed that the Foley balloon was inflated and patient's prostate.  When the balloon was deflated and catheter removed, the patient began to bleed from his penis.  Urology was then consulted for Foley catheter placement on 07/24/20. Dr. Claudia Desanctis performed cystoscopic foley catheter insertion with an 22 council tip foley over a wire.   Plan was for 2 week TOV. Unfortunately, atient bled form urethra again and continued to have hematuria. On 08/01/20 I placed the catheter on traction and applied pressure to the penis with kerlix gauze around the foley. Hematuria began to clear and his urethral meatus bleed stopped.   Since then he has had ongoing hematuria and downtrending hemoglobin to 7.2, requiring 1u pRBC today.   Upon my evaluation, the urine in the catheter tubing was cherry. The catheter was not on tension. There was no bleeding from the meatus. I applied Lidocaine jelly peri-catheter into the ruethra for lubrication. I then hubbed the catheter and instilled 10 more cc sterile water into the balloon for a total of 20cc. Next I flushed the catheter with 300 cc sterile water, achieving minimal clot return, and pink urine return. I therefore placed the catheter on traction, secured to his leg.   Review of systems: A 12 point comprehensive review of systems was obtained and is negative unless otherwise stated in the history of present illness.  Patient Active Problem List   Diagnosis Date Noted   Acute CHF (congestive heart failure) ([place])    AKI (acute kidney injury) ([place])    Community acquired pneumonia of left lower lobe of lung    Chest pain 07/22/2020   Lab test positive for detection of COVID-19 virus 07/22/2020   Pneumonia 07/22/2020   Chronic  kidney disease, stage 3a ([place]) 12/24/2019   Scalp laceration, subsequent encounter 05/30/2019   Slow urinary stream 09/18/2017   Dizzy 09/18/2017   Dementia with behavioral disturbance ([place]) 09/18/2017   Low testosterone in male 09/18/2017   Depression 09/18/2017   Anemia 09/18/2017   Vitamin D deficiency 09/18/2017   Weight loss 09/18/2017   Fatigue 09/06/2017   RLS (restless legs syndrome) 08/27/2017   Rash 08/27/2017   Dysuria 08/23/2017   Insomnia 08/23/2017   Preventative health care 0000000   Chronic systolic CHF (congestive heart failure) ([place]) 06/13/2012   Murmur 04/21/2012   Coronary artery disease 04/05/2011   Diabetes mellitus type II, non insulin dependent ([place]) 01/21/2009   Essential hypertension 01/21/2009   HYPERCHOLESTEROLEMIA 01/20/2009   CAD, ARTERY BYPASS GRAFT 01/20/2009    No current facility-administered medications on file prior to encounter.   Current Outpatient Medications on File Prior to Encounter  Medication Sig Dispense Refill   Ascorbic Acid (VITAMIN C PO) Take 1 capsule by mouth at bedtime.     aspirin EC 81 MG tablet Take 81 mg by mouth every morning. Swallow whole.     Cholecalciferol (VITAMIN D3 PO) Take 1 capsule by mouth at bedtime.     losartan (COZAAR) 50 MG tablet Take 1 tablet (50 mg total) by mouth daily. (Patient taking differently: Take 25 mg by mouth 2 (two) times daily.) 90 tablet 3   metoprolol tartrate (LOPRESSOR) 50 MG tablet TAKE 0.5 TABLET (25 MG TOTAL) BY MOUTH 2 TIMES DAILY (Patient taking differently:  Take 25 mg by mouth 2 (two) times daily.) 90 tablet 3   rosuvastatin (CRESTOR) 20 MG tablet Take 1 tablet (20 mg total) by mouth daily. (Patient taking differently: Take 20 mg by mouth at bedtime.) 90 tablet 3   albuterol (PROVENTIL HFA;VENTOLIN HFA) 108 (90 Base) MCG/ACT inhaler Inhale 2 puffs into the lungs every 6 (six) hours as needed for wheezing or shortness of breath. (Patient not taking: No sig reported) 1 Inhaler 11    amLODipine (NORVASC) 5 MG tablet Take 1 tablet (5 mg total) by mouth daily. 90 tablet 3   citalopram (CELEXA) 10 MG tablet Take 1 tablet (10 mg total) by mouth daily. (Patient not taking: No sig reported) 90 tablet 3   doxazosin (CARDURA) 2 MG tablet TAKE 1 TABLET (2 MG TOTAL) BY MOUTH AT BEDTIME. 90 tablet 2   furosemide (LASIX) 20 MG tablet TAKE 1 TABLET (20 MG TOTAL) BY MOUTH DAILY AS NEEDED (FOR SWELLING). (Patient taking differently: Take 20 mg by mouth daily as needed (swelling).) 90 tablet 2   pramipexole (MIRAPEX) 0.25 MG tablet Take 1 tablet (0.25 mg total) by mouth at bedtime. (Patient not taking: No sig reported) 90 tablet 3   QUEtiapine (SEROQUEL) 50 MG tablet Take 1 tablet (50 mg total) by mouth at bedtime. (Patient not taking: No sig reported) 90 tablet 3   tamsulosin (FLOMAX) 0.4 MG CAPS capsule TAKE 1 CAPSULE BY MOUTH EVERY DAY (Patient not taking: No sig reported) 90 capsule 1   traZODone (DESYREL) 50 MG tablet TAKE 1/2 TO 1 TABLET BY MOUTH AT BEDTIME AS NEEDED FOR SLEEP (Patient not taking: No sig reported) 90 tablet 1    Past Medical History:  Diagnosis Date   Aortic insufficiency    Coronary artery disease    coronary artery bypass graft x 5 in 1996   Diabetes mellitus    Hypercholesterolemia    non-insulin dependent   Hypertension    Ischemic cardiomyopathy    Mitral regurgitation    PVC (premature ventricular contraction)     Past Surgical History:  Procedure Laterality Date   CORONARY ARTERY BYPASS GRAFT     LUNG SURGERY     OTHER SURGICAL HISTORY     percutaneous coronary intervention of the  posterolateral segment on 01/27/1998    Social History   Tobacco Use   Smoking status: Never   Smokeless tobacco: Never  Vaping Use   Vaping Use: Never used  Substance Use Topics   Alcohol use: No   Drug use: No    Family History  Problem Relation Age of Onset   CAD Neg Hx     PE: Vitals:   08/03/20 1214 08/03/20 1220 08/03/20 1520 08/03/20 1605  BP:   (!) 128/55 (!) 138/56 137/60  Pulse:  68 69 66  Resp:  '18 18 19  '$ Temp:  98.6 F (37 C) 98.4 F (36.9 C) 98.3 F (36.8 C)  TempSrc:  Oral Oral Oral  SpO2:  96% 97% 94%  Weight: 81.2 kg     Height: '5\' 10"'$  (1.778 m)      Patient is iented and conversant Atraumatic normocephalic head Breathing comfortably Abdomen is soft GU: circ phallus, foley in place to traction with thin cherry urine output.  Lower extremities are symmetric without appreciable edema Grossly neurologically intact No identifiable skin lesions  Recent Labs    08/01/20 0153 08/02/20 0636 08/03/20 0041  WBC 9.0 11.5* 10.1  HGB 8.5* 8.1* 7.2*  HCT 25.6* 24.7* 22.4*  Recent Labs    08/01/20 0153 08/02/20 0636 08/03/20 0041  NA 133* 134* 133*  K 4.7 4.4 4.4  CL 102 101 102  CO2 '23 26 23  '$ GLUCOSE 110* 102* 106*  BUN 46* 47* 47*  CREATININE 5.83* 5.82* 5.75*  CALCIUM 7.9* 8.2* 8.1*    No results for input(s): LABPT, INR in the last 72 hours. No results for input(s): LABURIN in the last 72 hours. Results for orders placed or performed during the hospital encounter of 07/22/20  Resp Panel by RT-PCR (Flu A&B, Covid) Nasopharyngeal Swab     Status: Abnormal   Collection Time: 07/22/20  7:43 AM   Specimen: Nasopharyngeal Swab; Nasopharyngeal(NP) swabs in vial transport medium  Result Value Ref Range Status   SARS Coronavirus 2 by RT PCR POSITIVE (A) NEGATIVE Final    Comment: RESULT CALLED TO, READ BACK BY AND VERIFIED WITH: RN L MEEKS [id number] (NOTE) SARS-CoV-2 target nucleic acids are DETECTED.  The SARS-CoV-2 RNA is generally detectable in upper respiratory specimens during the acute phase of infection. Positive results are indicative of the presence of the identified virus, but do not rule out bacterial infection or co-infection with other pathogens not detected by the test. Clinical correlation with patient history and other diagnostic information is necessary to determine  patient infection status. The expected result is Negative.  Fact Sheet for Patients: EntrepreneurPulse.com.au  Fact Sheet for Healthcare Providers: IncredibleEmployment.be  This test is not yet approved or cleared by the [place] FDA and  has been authorized for detection and/or diagnosis of SARS-CoV-2 by FDA under an Emergency Use Authorization (EUA).  This EUA will remain in effect (meaning this test can be used)  for the duration of  the COVID-19 declaration under Section 564(b)(1) of the Act, 21 U.S.C. section 360bbb-3(b)(1), unless the authorization is terminated or revoked sooner.     Influenza A by PCR NEGATIVE NEGATIVE Final   Influenza B by PCR NEGATIVE NEGATIVE Final    Comment: (NOTE) The Xpert Xpress SARS-CoV-2/FLU/RSV plus assay is intended as an aid in the diagnosis of influenza from Nasopharyngeal swab specimens and should not be used as a sole basis for treatment. Nasal washings and aspirates are unacceptable for Xpert Xpress SARS-CoV-2/FLU/RSV testing.  Fact Sheet for Patients: EntrepreneurPulse.com.au  Fact Sheet for Healthcare Providers: IncredibleEmployment.be  This test is not yet approved or cleared by the [place] FDA and has been authorized for detection and/or diagnosis of SARS-CoV-2 by FDA under an Emergency Use Authorization (EUA). This EUA will remain in effect (meaning this test can be used) for the duration of the COVID-19 declaration under Section 564(b)(1) of the Act, 21 U.S.C. section 360bbb-3(b)(1), unless the authorization is terminated or revoked.  Performed at [facility], [place] [address] [place], [place] [address]     Imaging: CT Abd/Pelvis 07/24/20 IMPRESSION: 1. Colonic involvement in left inguinal hernia, without obstruction or strangulation. 2. Foley catheter balloon malpositioned centrally within the prostate gland. 3. Innumerable  bladder calculi. 4. Colonic diverticulosis 5. Coarse left lower lobe airspace opacities of uncertain chronicity. Recommend comparison to any previous available outside studies. 6.  Aortic Atherosclerosis (ICD10-170.0).   Imp/Recommendations: 87 year old man admitted to the hospital for COVID-pneumonia s/p traumatic Foley catheter placement.urology scoped in a catheter on 07/24/20. Persistent bleeding per urethra and hematuria. Now has 20cc in his 18Fr council tip catheter balloon and the catheter is on traction.   If hematuria clears, we will keep current catheter and avoid placing a  hematuria catheter or starting CBI. Depending on urine consistency in the morning we will flush the catheter at bedside and decide on whether further evaluation or intervention is warranted.   -Continue Foley catheter to traction for now. Urology will re-evaluate in the AM.  - Continue flomax -Void trial in 2 weeks either as outpatient at [facility] urology or inpatient if still hospitalized   Drucilla Cumber Abu-[place]ha

## 2020-08-03 NOTE — Progress Notes (Signed)
PT Cancellation Note  Patient Details Name: Philip Kerr MRN: [id number] DOB: 03-04-1933   Cancelled Treatment:    Reason Eval/Treat Not Completed: Medical issues which prohibited therapy Attempted to return, however RN advises PT hold due to pause on tele and concern over possible seizure. Will check back tomorrow.   [name], DPT, PN1   Supplemental Physical Therapist [facility]    Pager [phone] Acute Rehab Office [phone]

## 2020-08-04 ENCOUNTER — Inpatient Hospital Stay (HOSPITAL_COMMUNITY): Payer: Medicare HMO

## 2020-08-04 DIAGNOSIS — R9431 Abnormal electrocardiogram [ECG] [EKG]: Secondary | ICD-10-CM

## 2020-08-04 DIAGNOSIS — J189 Pneumonia, unspecified organism: Secondary | ICD-10-CM | POA: Diagnosis not present

## 2020-08-04 LAB — TYPE AND SCREEN
ABO/RH(D): A POS
Antibody Screen: NEGATIVE
Unit division: 0

## 2020-08-04 LAB — BPAM RBC
Blood Product Expiration Date: 202208242359
ISSUE DATE / TIME: 202207271153
Unit Type and Rh: 6200

## 2020-08-04 LAB — CBC
HCT: 24.2 % — ABNORMAL LOW (ref 39.0–52.0)
Hemoglobin: 7.8 g/dL — ABNORMAL LOW (ref 13.0–17.0)
MCH: 32.6 pg (ref 26.0–34.0)
MCHC: 32.2 g/dL (ref 30.0–36.0)
MCV: 101.3 fL — ABNORMAL HIGH (ref 80.0–100.0)
Platelets: 279 10*3/uL (ref 150–400)
RBC: 2.39 MIL/uL — ABNORMAL LOW (ref 4.22–5.81)
RDW: 18.7 % — ABNORMAL HIGH (ref 11.5–15.5)
WBC: 9.9 10*3/uL (ref 4.0–10.5)
nRBC: 0 % (ref 0.0–0.2)

## 2020-08-04 LAB — BASIC METABOLIC PANEL
Anion gap: 6 (ref 5–15)
BUN: 50 mg/dL — ABNORMAL HIGH (ref 8–23)
CO2: 26 mmol/L (ref 22–32)
Calcium: 8.2 mg/dL — ABNORMAL LOW (ref 8.9–10.3)
Chloride: 100 mmol/L (ref 98–111)
Creatinine, Ser: 5.39 mg/dL — ABNORMAL HIGH (ref 0.61–1.24)
GFR, Estimated: 10 mL/min — ABNORMAL LOW (ref >60)
Glucose, Bld: 125 mg/dL — ABNORMAL HIGH (ref 70–99)
Potassium: 4.9 mmol/L (ref 3.5–5.1)
Sodium: 132 mmol/L — ABNORMAL LOW (ref 135–145)

## 2020-08-04 LAB — GLUCOSE, CAPILLARY
Glucose-Capillary: 102 mg/dL — ABNORMAL HIGH (ref 70–99)
Glucose-Capillary: 142 mg/dL — ABNORMAL HIGH (ref 70–99)
Glucose-Capillary: 149 mg/dL — ABNORMAL HIGH (ref 70–99)
Glucose-Capillary: 165 mg/dL — ABNORMAL HIGH (ref 70–99)

## 2020-08-04 LAB — URINALYSIS, COMPLETE (UACMP) WITH MICROSCOPIC
Bilirubin Urine: NEGATIVE
Glucose, UA: NEGATIVE mg/dL
Ketones, ur: NEGATIVE mg/dL
Nitrite: NEGATIVE
Protein, ur: 100 mg/dL — AB
RBC / HPF: >50 RBC/hpf — ABNORMAL HIGH (ref 0–5)
Specific Gravity, Urine: 1.004 — ABNORMAL LOW (ref 1.005–1.030)
pH: 6 (ref 5.0–8.0)

## 2020-08-04 LAB — HEMOGLOBIN AND HEMATOCRIT, BLOOD
HCT: 25.7 % — ABNORMAL LOW (ref 39.0–52.0)
Hemoglobin: 8.4 g/dL — ABNORMAL LOW (ref 13.0–17.0)

## 2020-08-04 MED ORDER — SODIUM CHLORIDE 0.9 % IR SOLN
3000.0000 mL | Status: DC
  Administered 2020-08-04 – 2020-08-05 (×2): 3000 mL

## 2020-08-04 MED ORDER — BELLADONNA ALKALOIDS-OPIUM 16.2-60 MG RE SUPP
0.5000 | Freq: Three times a day (TID) | RECTAL | Status: DC | PRN
  Administered 2020-08-04: 0.5 via RECTAL
  Filled 2020-08-04: qty 1

## 2020-08-04 MED ORDER — SODIUM CHLORIDE 0.9 % IV SOLN
2.0000 g | Freq: Once | INTRAVENOUS | Status: AC
  Administered 2020-08-04: 2 g via INTRAVENOUS
  Filled 2020-08-04: qty 20

## 2020-08-04 MED ORDER — BELLADONNA ALKALOIDS-OPIUM 16.2-30 MG RE SUPP
1.0000 | Freq: Three times a day (TID) | RECTAL | Status: DC | PRN
  Filled 2020-08-04: qty 1

## 2020-08-04 MED ORDER — OXYBUTYNIN CHLORIDE 5 MG PO TABS
5.0000 mg | ORAL_TABLET | Freq: Three times a day (TID) | ORAL | Status: DC | PRN
  Administered 2020-08-04 – 2020-08-06 (×3): 5 mg via ORAL
  Filled 2020-08-04 (×5): qty 1

## 2020-08-04 MED ORDER — LIDOCAINE 5 % EX OINT
TOPICAL_OINTMENT | Freq: Three times a day (TID) | CUTANEOUS | Status: DC | PRN
  Filled 2020-08-04: qty 35.44

## 2020-08-04 NOTE — Progress Notes (Signed)
OT Cancellation Note  Patient Details Name: Philip Kerr MRN: [id number] DOB: 1933-12-04   Cancelled Treatment:    Reason Eval/Treat Not Completed: Patient not medically ready. Patient still having issues with bleeding at/around foley cath. Urology recommends holding therapy today. Plan to reattempt once pt is medically ready.   Tyrone Schimke, OT Acute Rehabilitation Services Pager: [phone] Office: [phone]  08/04/2020, 8:53 AM

## 2020-08-04 NOTE — Progress Notes (Signed)
Urology Progress Note    History of present illness: 87 year old man admitted for COVID-pneumonia found to have bloody urine output.  CT scan showed that the Foley balloon was inflated and patient's prostate.  When the balloon was deflated and catheter removed, the patient began to bleed from his penis.  Urology was then consulted for Foley catheter placement on 07/24/20. Dr. Claudia Desanctis performed cystoscopic foley catheter insertion with an 46 council tip foley over a wire.  Has had continued hematuria and downtrending hemoglobin. Catheter currently on tension with 20cc in balloon with pink to cherry output.    Subjective: Catheter was flushed at bedside this morning with minimal clot return. Placed on tension again. Output watermelon.   Objective: Vital signs in last 24 hours: Temp:  [97.6 F (36.4 C)-98.8 F (37.1 C)] 97.6 F (36.4 C) (07/28 0746) Pulse Rate:  [63-82] 63 (07/28 0746) Resp:  [16-20] 19 (07/28 0746) BP: (123-151)/(35-69) 129/65 (07/28 0746) SpO2:  [91 %-97 %] 91 % (07/28 0746) Weight:  [81.2 kg-81.3 kg] 81.3 kg (07/28 0421)  Intake/Output from previous day: 07/27 0701 - 07/28 0700 In: 840 [P.O.:450; Blood:390] Out: 825 [Urine:825] Intake/Output this shift: No intake/output data recorded.  Physical Exam:  General: Alert and oriented CV: Regular rate Lungs: No increased work of breathing Abdomen: Soft nontender  GU: Foley in place on tension draining watermelon urine  Ext: NT, No erythema  Lab Results: Recent Labs    08/03/20 0041 08/03/20 1728 08/04/20 0117  HGB 7.2* 8.6* 7.8*  HCT 22.4* 27.0* 24.2*   Recent Labs    08/03/20 0041 08/04/20 0117  NA 133* 132*  K 4.4 4.9  CL 102 100  CO2 23 26  GLUCOSE 106* 125*  BUN 47* 50*  CREATININE 5.75* 5.39*  CALCIUM 8.1* 8.2*    Studies/Results: No results found.  Assessment/Plan:  87 y.o. male who was admitted with covid pneumonia and had a traumatic foley catheter placement. Currently with 18Fr council  tip catheter with 20cc in balloon on tension.   Hematuria. Hemoglobin 7.8 this morning. Watermelon in color. Minimal clot on manual irigation  - Recommend obtaining a bladder ultrasound to assess for clot burden. Bladder should be distended with 150cc sterile water or saline immediately prior to ultrasound. This has been put in the order for the ultrasound.  - Recommend continuing catheter on traction for now and monitoring urine consistency. Urology will re-evaluate again this afternoon.   Dispo: per primary team   LOS: 13 days

## 2020-08-04 NOTE — TOC Initial Note (Signed)
Transition of Care [facility]) - Initial/Assessment Note    Patient Details  Name: Philip Kerr MRN: [id number] Date of Birth: 12-31-1933  Transition of Care [facility]) CM/SW Contact:    Verdell Carmine, RN Phone Number: 08/04/2020, 4:25 PM  Clinical Narrative:                 Patient admitted with chest pain, had COVID pneumonia, off [facility] course complicated by severe delirium, and acute urinary retention with foley . PT and OT has not been able to assess him for a couple of days due to pain, and now bleeding from foley. . The last note was written on the 23rd of July, where the patient likely needed a walker  and some supervision, but no HH at that time. Will continue to monitor recommendations.   Expected Discharge Plan: [place] Barriers to Discharge: Continued Medical Work up   Patient Goals and CMS Choice        Expected Discharge Plan and Services Expected Discharge Plan: [place]       Living arrangements for the past 2 months: Single Family Home                                      Prior Living Arrangements/Services Living arrangements for the past 2 months: Single Family Home Lives with:: Self Patient language and need for interpreter reviewed:: Yes        Need for Family Participation in Patient Care: Yes (Comment) Care giver support system in place?: Yes (comment)   Criminal Activity/Legal Involvement Pertinent to Current Situation/Hospitalization: No - Comment as needed  Activities of Daily Living Home Assistive Devices/Equipment: None ADL Screening (condition at time of admission) Patient's cognitive ability adequate to safely complete daily activities?: Yes Is the patient deaf or have difficulty hearing?: No Does the patient have difficulty seeing, even when wearing glasses/contacts?: No Does the patient have difficulty concentrating, remembering, or making decisions?: No Patient able to express  need for assistance with ADLs?: Yes Does the patient have difficulty dressing or bathing?: No Independently performs ADLs?: Yes (appropriate for developmental age) Does the patient have difficulty walking or climbing stairs?: Yes Weakness of Legs: Both Weakness of Arms/Hands: Both  Permission Sought/Granted                  Emotional Assessment       Orientation: : Oriented to Self, Oriented to Place Alcohol / Substance Use: Not Applicable Psych Involvement: No (comment)  Admission diagnosis:  Pneumonia [J18.9] Chest pain, unspecified type [R07.9] Community acquired pneumonia of left lower lobe of lung [J18.9] Patient Active Problem List   Diagnosis Date Noted   Acute CHF (congestive heart failure) ([place])    AKI (acute kidney injury) ([place])    Community acquired pneumonia of left lower lobe of lung    Chest pain 07/22/2020   Lab test positive for detection of COVID-19 virus 07/22/2020   Pneumonia 07/22/2020   Chronic kidney disease, stage 3a ([place]) 12/24/2019   Scalp laceration, subsequent encounter 05/30/2019   Slow urinary stream 09/18/2017   Dizzy 09/18/2017   Dementia with behavioral disturbance ([place]) 09/18/2017   Low testosterone in male 09/18/2017   Depression 09/18/2017   Anemia 09/18/2017   Vitamin D deficiency 09/18/2017   Weight loss 09/18/2017   Fatigue 09/06/2017   RLS (restless legs syndrome) 08/27/2017  Rash 08/27/2017   Dysuria 08/23/2017   Insomnia 08/23/2017   Preventative health care 0000000   Chronic systolic CHF (congestive heart failure) ([place]) 06/13/2012   Murmur 04/21/2012   Coronary artery disease 04/05/2011   Diabetes mellitus type II, non insulin dependent ([place]) 01/21/2009   Essential hypertension 01/21/2009   HYPERCHOLESTEROLEMIA 01/20/2009   CAD, ARTERY BYPASS GRAFT 01/20/2009   PCP:  Biagio Borg, MD Pharmacy:   [id number] [place], [place] - [address]  [place] [address]Phone: [phone]: [phone]    Social Determinants of Health (SDOH) Interventions    Readmission Risk Interventions No flowsheet data found.

## 2020-08-04 NOTE — Progress Notes (Signed)
PROGRESS NOTE    Philip Kerr  [id number] DOB: Nov 28, 1933 DOA: 07/22/2020 PCP: Biagio Borg, MD   Chief Complaint  Patient presents with   Chest Pain   Brief Narrative: 87 year old male with CAD, CABG 1996/ischemic cardiomyopathy, chronic systolic CHF EF 123456, 123456, HTN presented with chest pain, nausea on had chronic cough that worsened past 3 to 4 days prior to admission and developed chest pain while at rest.  In the ED minimal and stable elevated troponin without EKG changes so was sent home from ED but immediately returned due to worsening chest pain weakness and cough and work-up found transaminitis, possible bacterial pneumonia, COVID-19 positive.  Hospital course complicated by severe delirium that has now resolved, acute urine retention with Foley inserted by urology due to difficulty and worsening renal failure, was seen by nephrology and they have signed off with outpatient follow-up recommendation. Patient started having hematuria from the penile Foley heparin and aspirin were discontinued-urology has been following. Taken off covid isolation 08/02/20  Subjective: Seen and examined.  Wife is at the bedside Overnight no fever.    Assessment & Plan:  Left lower lobe bacterial pneumonia COVID-19 positive procalcitonin peaked at 20 > downtrended to 5.3, consistent with bacterial pneumonia in the setting of COVID infection.  Completed antibiotics initially on ceftriaxone/doxycycline changed to Zosyn>cefepime.  Clinically stable on room air off airborne precaution Recent Labs  Lab 08/01/20 0153 08/02/20 0636 08/03/20 0041 08/04/20 0117  WBC 9.0 11.5* 10.1 9.9   Nonoliguric AKI in the setting of CKD stage IIIa Baseline creatinine 1.5 peaked to 5.9 > downtrending to 5.3, Foley in place due to BPH/hematuria. .Suspected ATN, his renal ultrasound showed bilateral renal cyst. losartan has been discontinued.Monitor BMP  daily while here. Nephro advised outpatient follow-up and  signed off.  Recent Labs  Lab 07/31/20 0544 08/01/20 0153 08/02/20 0636 08/03/20 0041 08/04/20 0117  BUN 43* 46* 47* 47* 50*  CREATININE 5.91* 5.83* 5.82* 5.75* 5.39*   Intake/Output Summary (Last 24 hours) at 08/04/2020 1135 Last data filed at 08/04/2020 0342 Gross per 24 hour  Intake 640 ml  Output 675 ml  Net -35 ml   Gross hematuria from Foley catheter site Foley catheter in place for urine retention BPH w/ traumatic Foley insertion Acute blood loss anemia due to hematuria Appears to be clearing, has pinkish urine, urology following closely.  Needed 1 unit blood transfusion as hemoglobin down trended 7.2, monitor H&H continue plan as per urology, continue to hold Asa, heparin . Recent Labs  Lab 08/01/20 0153 08/02/20 0636 08/03/20 0041 08/03/20 1728 08/04/20 0117  HGB 8.5* 8.1* 7.2* 8.6* 7.8*  HCT 25.6* 24.7* 22.4* 27.0* 24.2*   Acute on chronic systolic CHF EF 123456 , BNP elevated 4160-echo shows  7/24-EF 45 to 50% severe asymmetric left ventricular hypertrophy moderate MR monitor intake output .  CHF team input appreciated -not a candidate for ARB/ACE/ARN I due to AKI, BP too soft to add hydralazine/Imdur, Toprol-XL 12.5 mg was added but discontinued due to high-grade AV block.  Diuretics on hold due to AKI  Net IO Since Admission: -2,625.46 mL [08/04/20 1135]   AV block/Pause 7/27 during sleep, symptomatic :cardiology notified Lopressor was discontinued  7/26.Mag and potassium checked and stable.  Cardiology evaluating him today.  CAD/CABG 1996/ischemic cardiomyopathy Not much pleural fluid effusion on review by IR thoracentesis canceled 7/25 Chest pain resolved.Seen by cardiology and had a stable mild troponin elevation no significant delta.-In the setting of infection likely.Aspirin held due to  hematuria, resume statin.  T2DM: A1c 6.4, well controlled on sliding scale Recent Labs  Lab 08/03/20 0753 08/03/20 1237 08/03/20 1604 08/03/20 2036 08/04/20 0751  GLUCAP  116* 131* 142* 162* 102*  [id number] controlled, losartan discontinued 2/2 AKI. Back on metoprolol 7/25> but discontinued due to AV block 7/27  Mild hyponatremia monitor  Chronic back pain:stable  Electrolyte imbalance with hypokalemia/hypophosphatemia, hypomagnesemia Potassium magnesium stable  Dementia with behavioral disturbance-stable. He is interactive and appropriate.    Depression- mood is stable. Diet Order             DIET SOFT Room service appropriate? No; Fluid consistency: Thin  Diet effective now                  Patient's Body mass index is 25.72 kg/m. DVT prophylaxis: Place and maintain sequential compression device Start: 07/31/20 2314 Code Status:   Code Status: DNR  Family Communication: plan of care discussed with patient a and his wife at the bedside. Status is: Inpatient Remains inpatient appropriate because:IV treatments appropriate due to intensity of illness or inability to take PO and Inpatient level of care appropriate due to severity of illness Dispo: The patient is from: Home              Anticipated d/c is to: TBD. Ptot eval once hematuria resolves.              Patient currently is not medically stable to d/c.   Difficult to place patient No Unresulted Labs (From admission, onward)     Start     Ordered   08/03/20 1300  Hemoglobin and hematocrit, blood  Now then every 8 hours,   R     Question:  Specimen collection method  Answer:  Lab=Lab collect   08/03/20 0729   08/03/20 [id number]  Basic metabolic panel  Daily,   R     Question:  Specimen collection method  Answer:  Lab=Lab collect   08/02/20 1047   08/03/20 0500  CBC  Daily,   R     Question:  Specimen collection method  Answer:  Lab=Lab collect   08/02/20 1047          Medications reviewed:  Scheduled Meds:  Chlorhexidine Gluconate Cloth  6 each Topical Daily   feeding supplement (NEPRO CARB STEADY)  237 mL Oral [id number]   folic acid  1 mg Oral Daily   insulin aspart  0-5 Units  Subcutaneous QHS   insulin aspart  0-9 Units Subcutaneous TID WC   rosuvastatin  20 mg Oral QHS   tamsulosin  0.4 mg Oral QPC supper   Continuous Infusions: Consultants:see note  Procedures:see note Antimicrobials: Anti-infectives (From admission, onward)    Start     Dose/Rate Route Frequency Ordered Stop   07/26/20 1800  ceFEPIme (MAXIPIME) 1 g in sodium chloride 0.9 % 100 mL IVPB        1 g 200 mL/hr over 30 Minutes Intravenous Every 24 hours 07/26/20 1110 07/29/20 0954   07/24/20 1800  piperacillin-tazobactam (ZOSYN) IVPB 2.25 g  Status:  Discontinued        2.25 g 100 mL/hr over 30 Minutes Intravenous Every 8 hours 07/24/20 1026 07/26/20 1109   07/24/20 1115  piperacillin-tazobactam (ZOSYN) IVPB 3.375 g        3.375 g 100 mL/hr over 30 Minutes Intravenous  Once 07/24/20 1022 07/24/20 1304   07/23/20 2200  doxycycline (VIBRA-TABS) tablet 100 mg  Status:  Discontinued  100 mg Oral Every 12 hours 07/23/20 1043 07/24/20 1022   07/23/20 2045  cefTRIAXone (ROCEPHIN) 2 g in sodium chloride 0.9 % 100 mL IVPB  Status:  Discontinued        2 g 200 mL/hr over 30 Minutes Intravenous Every 24 hours 07/22/20 2116 07/24/20 1022   07/23/20 1806  metroNIDAZOLE (FLAGYL) IVPB 500 mg  Status:  Discontinued        500 mg 100 mL/hr over 60 Minutes Intravenous Every 8 hours 07/23/20 1724 07/24/20 1022   07/23/20 0845  doxycycline (VIBRAMYCIN) 100 mg in sodium chloride 0.9 % 250 mL IVPB  Status:  Discontinued        100 mg 125 mL/hr over 120 Minutes Intravenous Every 12 hours 07/22/20 2116 07/23/20 1043   07/22/20 2045  cefTRIAXone (ROCEPHIN) 1 g in sodium chloride 0.9 % 100 mL IVPB        1 g 200 mL/hr over 30 Minutes Intravenous  Once 07/22/20 2037 07/22/20 2319   07/22/20 2045  doxycycline (VIBRAMYCIN) 100 mg in sodium chloride 0.9 % 250 mL IVPB        100 mg 125 mL/hr over 120 Minutes Intravenous  Once 07/22/20 2037 07/23/20 0143      Culture/Microbiology    Component Value  Date/Time   SDES THROAT 01/15/2010 [place] ADDED ON [id number] AT [place] 01/15/2010 0734   REPTSTATUS 01/17/2010 FINAL 01/15/2010 0734    Other culture-see note  Objective: Vitals: Today's Vitals   08/04/20 0424 08/04/20 0746 08/04/20 0905 08/04/20 0950  BP:  129/65    Pulse:  63    Resp:  19    Temp:  97.6 F (36.4 C)    TempSrc:  Oral    SpO2:  91%    Weight:      Height:      PainSc: Asleep  7  2     Intake/Output Summary (Last 24 hours) at 08/04/2020 1135 Last data filed at 08/04/2020 0342 Gross per 24 hour  Intake 640 ml  Output 675 ml  Net -35 ml   Filed Weights   08/02/20 0210 08/03/20 1214 08/04/20 0421  Weight: 81.2 kg 81.2 kg 81.3 kg   Weight change:   Intake/Output from previous day: 07/27 0701 - 07/28 0700 In: 840 [P.O.:450; Blood:390] Out: 825 [Urine:825] Intake/Output this shift: No intake/output data recorded. Filed Weights   08/02/20 0210 08/03/20 1214 08/04/20 0421  Weight: 81.2 kg 81.2 kg 81.3 kg   Examination: General exam: AAO, pleasant, older than stated age, weak appearing. HEENT:Oral mucosa moist, Ear/Nose WNL grossly, dentition normal. Respiratory system: bilaterally clear breath sounds without any added sounds, no use of accessory muscle Cardiovascular system: S1 & S2 +, No JVD,. Gastrointestinal system: Abdomen soft, NT,ND, BS+, Foley cath in place with bag containing pinkish urine Nervous System:Alert, awake, moving extremities and grossly nonfocal Extremities: no edema, distal peripheral pulses palpable.  Skin: No rashes,no icterus. MSK: Normal muscle bulk,tone, power   Data Reviewed: I have personally reviewed following labs and imaging studies CBC: Recent Labs  Lab 08/01/20 0153 08/02/20 0636 08/03/20 0041 08/03/20 1728 08/04/20 0117  WBC 9.0 11.5* 10.1  --  9.9  HGB 8.5* 8.1* 7.2* 8.6* 7.8*  HCT 25.6* 24.7* 22.4* 27.0* 24.2*  MCV 104.1* 105.6* 106.7*  --  101.3*  PLT 237 281 275  --  [id number]   Basic Metabolic  Panel: Recent Labs  Lab 07/29/20 0046 07/30/20 0046 07/30/20 0432 07/31/20 0544 08/01/20 0153 08/02/20 0636  08/03/20 0041 08/03/20 1728 08/04/20 0117  NA 135 133*  --  136 133* 134* 133*  --  132*  K 3.2* 4.4 4.0 3.9 4.7 4.4 4.4  --  4.9  CL 98 99  --  100 102 101 102  --  100  CO2 27 24  --  '25 23 26 23  '$ --  26  GLUCOSE 128* 110*  --  98 110* 102* 106*  --  125*  BUN 41* 42*  --  43* 46* 47* 47*  --  50*  CREATININE 5.79* 5.72*  --  5.91* 5.83* 5.82* 5.75*  --  5.39*  CALCIUM 7.6* 7.6*  --  8.0* 7.9* 8.2* 8.1*  --  8.2*  MG  --   --  1.6* 2.0 1.9  --   --  2.0  --   PHOS 2.9 3.1  --  3.4 3.5  --   --   --   --    GFR: Estimated Creatinine Clearance: 10.5 mL/min (A) (by C-G formula based on SCr of 5.39 mg/dL (H)). Liver Function Tests: Recent Labs  Lab 07/29/20 0046 07/30/20 0046 07/31/20 0544  ALBUMIN 2.3* 2.2* 2.3*   No results for input(s): LIPASE, AMYLASE in the last 168 hours. No results for input(s): AMMONIA in the last 168 hours. Coagulation Profile: No results for input(s): INR, PROTIME in the last 168 hours. Cardiac Enzymes: No results for input(s): CKTOTAL, CKMB, CKMBINDEX, TROPONINI in the last 168 hours. BNP (last 3 results) No results for input(s): PROBNP in the last 8760 hours. HbA1C: No results for input(s): HGBA1C in the last 72 hours. CBG: Recent Labs  Lab 08/03/20 0753 08/03/20 1237 08/03/20 1604 08/03/20 2036 08/04/20 0751  GLUCAP 116* 131* 142* 162* 102*   Lipid Profile: No results for input(s): CHOL, HDL, LDLCALC, TRIG, CHOLHDL, LDLDIRECT in the last 72 hours. Thyroid Function Tests: No results for input(s): TSH, T4TOTAL, FREET4, T3FREE, THYROIDAB in the last 72 hours. Anemia Panel: No results for input(s): VITAMINB12, FOLATE, FERRITIN, TIBC, IRON, RETICCTPCT in the last 72 hours. Sepsis Labs: No results for input(s): PROCALCITON, LATICACIDVEN in the last 168 hours.   No results found for this or any previous visit (from the past 240  hour(s)).   Radiology Studies: No results found.   LOS: 13 days   Antonieta Pert, MD [facility]  08/04/2020, 11:35 AM

## 2020-08-04 NOTE — Progress Notes (Signed)
Progress Note  Patient Name: ABDURREHMAN RAPSON Date of Encounter: 08/04/2020  [facility] Cardiologist: Lauree Chandler, MD   Subjective   Patient denies CP  Breathing is fair  Inpatient Medications    Scheduled Meds:  Chlorhexidine Gluconate Cloth  6 each Topical Daily   feeding supplement (NEPRO CARB STEADY)  237 mL Oral [id number]   folic acid  1 mg Oral Daily   insulin aspart  0-5 Units Subcutaneous QHS   insulin aspart  0-9 Units Subcutaneous TID WC   rosuvastatin  20 mg Oral QHS   tamsulosin  0.4 mg Oral QPC supper   Continuous Infusions:  PRN Meds: acetaminophen, guaiFENesin-dextromethorphan, ondansetron **OR** ondansetron (ZOFRAN) IV, oxyCODONE, polyethylene glycol, polyvinyl alcohol, senna-docusate, traZODone   Vital Signs    Vitals:   08/03/20 2349 08/04/20 0337 08/04/20 0421 08/04/20 0746  BP: (!) 127/55 (!) 151/69  129/65  Pulse: 67 74  63  Resp: '17 18  19  '$ Temp: 98.6 F (37 C) 98.3 F (36.8 C)  97.6 F (36.4 C)  TempSrc: Oral Oral  Oral  SpO2: 93% 93%  91%  Weight:   81.3 kg   Height:        Intake/Output Summary (Last 24 hours) at 08/04/2020 [id number] Last data filed at 08/04/2020 0342 Gross per 24 hour  Intake 640 ml  Output 675 ml  Net -35 ml   Last 3 Weights 08/04/2020 08/03/2020 08/02/2020  Weight (lbs) 179 lb 3.7 oz 179 lb 0.2 oz 179 lb 0.2 oz  Weight (kg) 81.3 kg 81.2 kg 81.2 kg      Telemetry    Today  SR   - Personally Reviewed Yesterday reported 8 sec where pt had 1 QRS in middle (strip saved by central monitoring   Not visitble on chart)  ECG    No new tracings - Personally Reviewed  Physical Exam   GEN: No acute distress.   Neck: No JVD Cardiac: RRR, no murmurs RespiratoryRhonchi bilaterally   GI: Soft, nontender, non-distended  MS: No edema; No deformity. Neuro:  Nonfocal  Psych: Normal affect   Labs    High Sensitivity Troponin:   Recent Labs  Lab 07/22/20 0614 07/22/20 0840 07/22/20 1805 07/22/20 2005   TROPONINIHS 28* 24* 27* 32*      Chemistry Recent Labs  Lab 07/29/20 0046 07/30/20 0046 07/30/20 0432 07/31/20 0544 08/01/20 0153 08/02/20 0636 08/03/20 0041 08/04/20 0117  NA 135 133*  --  136   < > 134* 133* 132*  K 3.2* 4.4   < > 3.9   < > 4.4 4.4 4.9  CL 98 99  --  100   < > 101 102 100  CO2 27 24  --  25   < > '26 23 26  '$ GLUCOSE 128* 110*  --  98   < > 102* 106* 125*  BUN 41* 42*  --  43*   < > 47* 47* 50*  CREATININE 5.79* 5.72*  --  5.91*   < > 5.82* 5.75* 5.39*  CALCIUM 7.6* 7.6*  --  8.0*   < > 8.2* 8.1* 8.2*  ALBUMIN 2.3* 2.2*  --  2.3*  --   --   --   --   GFRNONAA 9* 9*  --  9*   < > 9* 9* 10*  ANIONGAP 10 10  --  11   < > '7 8 6   '$ < > = values in this interval not displayed.  Hematology Recent Labs  Lab 08/02/20 0636 08/03/20 0041 08/03/20 1728 08/04/20 0117  WBC 11.5* 10.1  --  9.9  RBC 2.34* 2.10*  --  2.39*  HGB 8.1* 7.2* 8.6* 7.8*  HCT 24.7* 22.4* 27.0* 24.2*  MCV 105.6* 106.7*  --  101.3*  MCH 34.6* 34.3*  --  32.6  MCHC 32.8 32.1  --  32.2  RDW 14.5 14.2  --  18.7*  PLT 281 275  --  279    BNP Recent Labs  Lab 07/31/20 0544  BNP 4,160.4*     DDimer No results for input(s): DDIMER in the last 168 hours.   Radiology    No results found.  Cardiac Studies   Echocardiogram 07/31/20: 1. Left ventricular ejection fraction, by estimation, is 45 to 50%. The  left ventricle has mildly decreased function. Left ventricular endocardial  border not optimally defined to evaluate regional wall motion. There is  severe asymmetric left ventricular   hypertrophy of the septal segment. Left ventricular diastolic parameters  are indeterminate.   2. Right ventricular systolic function is normal. The right ventricular  size is normal. Tricuspid regurgitation signal is inadequate for assessing  PA pressure.   3. Left atrial size was mildly dilated.   4. The mitral valve is abnormal. Moderate mitral valve regurgitation. No  evidence of mitral  stenosis.   5. Tricuspid valve regurgitation is mild to moderate.   6. The aortic valve is tricuspid. There is mild calcification of the  aortic valve. There is mild thickening of the aortic valve.   7. Aortic dilatation noted. There is mild dilatation of the aortic root,  measuring 40 mm.   Patient Profile     87 y.o. male with a PMH of CAD s/p CABG x5V in 1996, chronic LBBB, ischemic CM, systolic and diastolic heart failure, mitral regurgitation, AI, HTN, HLD, type 2 DM, CKD stage III, BPH, childhood left lobectomy, currently admitted for COVID-19 PNA and is being followed by cardiology intermittently this admission for CP, NSVT, and now pauses on telemetry.  Assessment & Plan    1. Pause: patient had an episodes of seizure like activity and possible LOC in the setting of high grade AV block lasting ~8 seconds around 1:30pm yesterday.   Still unable to see strip  Two days prior to this he was restarted on metoprolol for management of NSVT. This has now been held.   HR and BP are OK today   Follow on tele   2  seizure  Only witness was wife who says he jumped up in bed.  Last a lettle over 1 min  She says she has seen seizures before      Could have happened due to drop in HR/BP   Follow on telemtry   WIll defer to hsopitalist need for neuro evaluation.  Will continue to follow   Dorris Carnes      For questions or updates, please contact [place] Please consult [email or web address] for contact info under

## 2020-08-04 NOTE — Progress Notes (Signed)
PT Cancellation Note  Patient Details Name: Philip Kerr MRN: [id number] DOB: 1933-01-27   Cancelled Treatment:    Reason Eval/Treat Not Completed: Medical issues which prohibited therapy Patient still having issues with bleeding at/around foley cath- discussed case with urology MD who recommends we continue to hold PT today. Will continue to follow and return when medically ready.   [name], DPT, PN1   Supplemental Physical Therapist [facility]    Pager [phone] Acute Rehab Office [phone]

## 2020-08-05 ENCOUNTER — Inpatient Hospital Stay (HOSPITAL_COMMUNITY): Payer: Medicare HMO

## 2020-08-05 DIAGNOSIS — N179 Acute kidney failure, unspecified: Secondary | ICD-10-CM | POA: Diagnosis not present

## 2020-08-05 DIAGNOSIS — I2510 Atherosclerotic heart disease of native coronary artery without angina pectoris: Secondary | ICD-10-CM | POA: Diagnosis not present

## 2020-08-05 DIAGNOSIS — R4182 Altered mental status, unspecified: Secondary | ICD-10-CM

## 2020-08-05 DIAGNOSIS — I5022 Chronic systolic (congestive) heart failure: Secondary | ICD-10-CM | POA: Diagnosis not present

## 2020-08-05 LAB — BASIC METABOLIC PANEL
Anion gap: 16 — ABNORMAL HIGH (ref 5–15)
Anion gap: 8 (ref 5–15)
BUN: 56 mg/dL — ABNORMAL HIGH (ref 8–23)
BUN: 59 mg/dL — ABNORMAL HIGH (ref 8–23)
CO2: 22 mmol/L (ref 22–32)
CO2: 26 mmol/L (ref 22–32)
Calcium: 9.2 mg/dL (ref 8.9–10.3)
Calcium: 8.8 mg/dL — ABNORMAL LOW (ref 8.9–10.3)
Chloride: 96 mmol/L — ABNORMAL LOW (ref 98–111)
Chloride: 99 mmol/L (ref 98–111)
Creatinine, Ser: 5.55 mg/dL — ABNORMAL HIGH (ref 0.61–1.24)
Creatinine, Ser: 5.38 mg/dL — ABNORMAL HIGH (ref 0.61–1.24)
GFR, Estimated: 9 mL/min — ABNORMAL LOW (ref >60)
GFR, Estimated: 10 mL/min — ABNORMAL LOW (ref >60)
Glucose, Bld: 246 mg/dL — ABNORMAL HIGH (ref 70–99)
Glucose, Bld: 154 mg/dL — ABNORMAL HIGH (ref 70–99)
Potassium: 5.7 mmol/L — ABNORMAL HIGH (ref 3.5–5.1)
Potassium: 5.8 mmol/L — ABNORMAL HIGH (ref 3.5–5.1)
Sodium: 134 mmol/L — ABNORMAL LOW (ref 135–145)
Sodium: 133 mmol/L — ABNORMAL LOW (ref 135–145)

## 2020-08-05 LAB — GLUCOSE, CAPILLARY
Glucose-Capillary: 136 mg/dL — ABNORMAL HIGH (ref 70–99)
Glucose-Capillary: 114 mg/dL — ABNORMAL HIGH (ref 70–99)
Glucose-Capillary: 122 mg/dL — ABNORMAL HIGH (ref 70–99)
Glucose-Capillary: 130 mg/dL — ABNORMAL HIGH (ref 70–99)

## 2020-08-05 LAB — CBC
HCT: 30.7 % — ABNORMAL LOW (ref 39.0–52.0)
Hemoglobin: 9.7 g/dL — ABNORMAL LOW (ref 13.0–17.0)
MCH: 32.4 pg (ref 26.0–34.0)
MCHC: 31.6 g/dL (ref 30.0–36.0)
MCV: 102.7 fL — ABNORMAL HIGH (ref 80.0–100.0)
Platelets: 420 10*3/uL — ABNORMAL HIGH (ref 150–400)
RBC: 2.99 MIL/uL — ABNORMAL LOW (ref 4.22–5.81)
RDW: 18.3 % — ABNORMAL HIGH (ref 11.5–15.5)
WBC: 23.5 10*3/uL — ABNORMAL HIGH (ref 4.0–10.5)
nRBC: 0 % (ref 0.0–0.2)

## 2020-08-05 MED ORDER — OXYBUTYNIN CHLORIDE 5 MG PO TABS
5.0000 mg | ORAL_TABLET | Freq: Once | ORAL | Status: AC
  Administered 2020-08-05: 5 mg via ORAL
  Filled 2020-08-05: qty 1

## 2020-08-05 MED ORDER — SODIUM ZIRCONIUM CYCLOSILICATE 10 G PO PACK
10.0000 g | PACK | Freq: Once | ORAL | Status: AC
  Administered 2020-08-05: 10 g via ORAL
  Filled 2020-08-05: qty 1

## 2020-08-05 MED ORDER — SODIUM ZIRCONIUM CYCLOSILICATE 10 G PO PACK
10.0000 g | PACK | ORAL | Status: AC
  Administered 2020-08-05: 10 g via ORAL
  Filled 2020-08-05: qty 1

## 2020-08-05 MED ORDER — OXYCODONE HCL 5 MG PO TABS
5.0000 mg | ORAL_TABLET | Freq: Once | ORAL | Status: AC
  Administered 2020-08-05: 5 mg via ORAL
  Filled 2020-08-05: qty 1

## 2020-08-05 NOTE — Progress Notes (Signed)
Urology Progress Note    History of present illness: 87 year old man admitted for COVID-pneumonia found to have bloody urine output.  CT scan showed that the Foley balloon was inflated and patient's prostate.  When the balloon was deflated and catheter removed, the patient began to bleed from his penis.  Urology was then consulted for Foley catheter placement on 07/24/20. Dr. Claudia Desanctis performed cystoscopic foley catheter insertion with an 10 council tip foley over a wire.  Has had continued hematuria .  Catheter exchanged on 08/04/2020 for a 22 French three-way hematuria catheter.  30 cc of sterile water in the balloon.  Started on continuous bladder irrigation.   Subjective: Urine output in catheter this morning was very very very light pink on a moderate drip CBI.  I weaned his CBI to a very slow drip.  Philip Kerr is stable at 9.7.  Objective: Vital signs in last 24 hours: Temp:  [97.6 F (36.4 C)-98.3 F (36.8 C)] 98 F (36.7 C) (07/29 0734) Pulse Rate:  [63-100] 87 (07/29 0734) Resp:  [18-23] 20 (07/29 0734) BP: (114-153)/(56-86) 128/86 (07/29 0734) SpO2:  [90 %-98 %] 97 % (07/29 0734)  Intake/Output from previous day: 07/28 0701 - 07/29 0700 In: 9100  Out: 11775 [Urine:11775] Intake/Output this shift: No intake/output data recorded.  Physical Exam:  General: Alert and oriented CV: Regular rate Lungs: No increased work of breathing Abdomen: Soft nontender  GU: Foley in place on tension draining clear urine on a very slow drip CBI. Ext: NT, No erythema  Lab Results: Recent Labs    08/04/20 0117 08/04/20 1245 08/05/20 0035  HGB 7.8* 8.4* 9.7*  HCT 24.2* 25.7* 30.7*    Recent Labs    08/04/20 0117 08/05/20 0035  NA 132* 134*  K 4.9 5.7*  CL 100 96*  CO2 26 22  GLUCOSE 125* 246*  BUN 50* 56*  CREATININE 5.39* 5.55*  CALCIUM 8.2* 9.2     Studies/Results: US PELVIS LIMITED (TRANSABDOMINAL ONLY)  Result Date: 08/04/2020 CLINICAL DATA:  Hematuria. EXAM: LIMITED  ULTRASOUND OF PELVIS TECHNIQUE: Limited transabdominal ultrasound examination of the pelvis was performed. COMPARISON:  None. FINDINGS: Foley catheter is noted within urinary bladder. Prevoid volume was 76 mL. Post Foley drainage, urine volume was measured at 5 mm. Wall thickening of the urinary bladder is noted, but this may be due to lack of distension. IMPRESSION: Foley catheter is noted in the urinary bladder. Wall thickening is noted, but this may be due to the lack of distension. Electronically Signed   By: Marijo Conception M.D.   On: 08/04/2020 17:25    Assessment/Plan:  86 y.o. male who was admitted with covid pneumonia and had a traumatic foley catheter placement. Currently with 18Fr council tip catheter with 20cc in balloon on tension.   Hematuria. Hemoglobin stable this morning.  Three-way hematuria catheter in place and urine is clear on a slow drip CBI.  -Continue very slow drip CBI today.  Urology will evaluate this afternoon to see if the CBI can be clamped.  Dispo: per primary team   LOS: 14 days

## 2020-08-05 NOTE — Progress Notes (Signed)
Physical Therapy Treatment Patient Details Name: SHAURYA HARIRI MRN: [id number] DOB: [birth year] Today's Date: 08/05/2020    History of Present Illness Pt is 87 y.o. male presented 07/22/20 to the emergency department with chest pain. His work-up in ED was significant for transaminitis, pneumonia, as well he was COVID-19 positive, hospital course was complicated by severe delirium, and worsening renal failure. Also complicated by traumatic catheter insertion and now requiring CBI.  On 7/29 recieved clearance from urology to resume PT/OT.  PMH significant for coronary artery disease with CABG in 1996, ischemic cardiomyopathy, type 2 diabetes mellitus, hypertension, and dementia.    PT Comments    Pt had not been able to be seen by therapy since 07/29/20 due to multiple medical issues.  Today, pt with similar presentation.  Remains confused and impulsive but able to transfers with min guard - min A levels and frequent cues for safety.  Limited treatment session to walking only - due to pt wanting to return to bed.  Initial goals remain appropriate.  Wife present and reports she will be with pt 24/7 at home - agreeable to plan for home.  Did add recommendation for HHPT.      Follow Up Recommendations  Home health PT;Supervision/Assistance - 24 hour     Equipment Recommendations  Rolling walker with 5" wheels    Recommendations for Other Services       Precautions / Restrictions Precautions Precautions: Fall Precaution Comments: HOH, impulsive Restrictions Weight Bearing Restrictions: No    Mobility  Bed Mobility Overal bed mobility: Needs Assistance Bed Mobility: Sit to Supine     Supine to sit: Min assist;HOB elevated Sit to supine: Supervision   General bed mobility comments: At EOB with OT upon arrival.    Transfers Overall transfer level: Needs assistance Equipment used: Rolling walker (2 wheeled) Transfers: Sit to/from Stand Sit to Stand: Min guard         General  transfer comment: Min guard to steady to stand; Cues for safety with lines with sitting - pt sat and then quickly returned to supine needing assist to maintain lines.  Ambulation/Gait Ambulation/Gait assistance: Min guard Gait Distance (Feet): 70 Feet Assistive device: Rolling walker (2 wheeled) Gait Pattern/deviations: Step-through pattern;Decreased stride length     General Gait Details: Pt impulsive and unsafe with RW with turns.  Pt picks up walker to turn - educated on turning with RW on ground but pt not following suggestion/cue.  Did maintain good proximity to RW today.  Pt declined ambulation in hall so made 3 laps in room with frequent turns.   Stairs             Wheelchair Mobility    Modified Rankin (Stroke Patients Only)       Balance Overall balance assessment: Needs assistance Sitting-balance support: No upper extremity supported;Feet supported Sitting balance-Leahy Scale: Good Sitting balance - Comments: able to sit edge of bed.   Standing balance support: Bilateral upper extremity supported;No upper extremity supported Standing balance-Leahy Scale: Fair Standing balance comment: RW to ambulate; could static stand without support                            Cognition Arousal/Alertness: Awake/alert Behavior During Therapy: Impulsive Overall Cognitive Status: Impaired/Different from baseline Area of Impairment: Safety/judgement;Awareness;Problem solving                       Following Commands: Follows one step  commands consistently;Follows one step commands with increased time Safety/Judgement: Decreased awareness of safety;Decreased awareness of deficits Awareness: Emergent Problem Solving: Requires verbal cues General Comments: Patient's wife reports patient's cognition still impaired compared to baseline.      Exercises      General Comments General comments (skin integrity, edema, etc.): spouse present      Pertinent  Vitals/Pain Pain Assessment: No/denies pain    Home Living                      Prior Function            PT Goals (current goals can now be found in the care plan section) Acute Rehab PT Goals Patient Stated Goal: "do what i want to do"; wife agreeable to home with HHPT and can provide 24 hr support at d/c PT Goal Formulation: With patient/family Time For Goal Achievement: 08/09/20 Potential to Achieve Goals: Good Progress towards PT goals: Progressing toward goals    Frequency    Min 3X/week      PT Plan Discharge plan needs to be updated    Co-evaluation       OT goals addressed during session: Other (comment) (activity tolerance, functional mobility)      AM-PAC PT "6 Clicks" Mobility   Outcome Measure  Help needed turning from your back to your side while in a flat bed without using bedrails?: A Little Help needed moving from lying on your back to sitting on the side of a flat bed without using bedrails?: A Little Help needed moving to and from a bed to a chair (including a wheelchair)?: A Little Help needed standing up from a chair using your arms (e.g., wheelchair or bedside chair)?: A Little Help needed to walk in hospital room?: A Little Help needed climbing 3-5 steps with a railing? : A Little 6 Click Score: 18    End of Session Equipment Utilized During Treatment: Gait belt Activity Tolerance: Patient tolerated treatment well Patient left: in bed;with call bell/phone within reach;with bed alarm set;with family/visitor present Nurse Communication: Mobility status PT Visit Diagnosis: Unsteadiness on feet (R26.81)     Time: [id number] PT Time Calculation (min) (ACUTE ONLY): 9 min  Charges:  $Gait Training: 8-22 mins                     Abran Richard, PT Acute Rehab Services Pager [phone] [facility] 08/05/2020, 11:30 AM

## 2020-08-05 NOTE — Progress Notes (Signed)
CBI not outflowing.  Attached 60cc irrigation syringe and gently irrigated clots out of catheter.  Reattached CBI

## 2020-08-05 NOTE — Progress Notes (Addendum)
PROGRESS NOTE    Philip Kerr  [id number] DOB: 10/23/1933 DOA: 07/22/2020 PCP: Biagio Borg, MD   Chief Complaint  Patient presents with   Chest Pain   Brief Narrative: 87 year old male with CAD, CABG 1996/ischemic cardiomyopathy, chronic systolic CHF EF 123456, 123456, HTN presented with chest pain, nausea on had chronic cough that worsened past 3 to 4 days prior to admission and developed chest pain while at rest.  In the ED minimal and stable elevated troponin without EKG changes so was sent home from ED but immediately returned due to worsening chest pain weakness and cough and work-up found transaminitis, possible bacterial pneumonia, COVID-19 positive.  Hospital course complicated by severe delirium that has now resolved, acute urine retention with Foley inserted by urology due to difficulty and worsening renal failure, was seen by nephrology and they have signed off with outpatient follow-up recommendation. Patient started having hematuria from the penile Foley heparin and aspirin were discontinued-urology has been following. Taken off covid isolation 08/02/20  Subjective: Patient was placed in CBI overnight intermittent issue with no outflow and was flushed  Currently spasm and pain better.  Urine in the Foley catheter appears to be clearing up. Significant bump in WBC count but afebrile  Assessment & Plan:  Left lower lobe bacterial pneumonia COVID-19 positive procalcitonin peaked at 20 > downtrended to 5.3, consistent with bacterial pneumonia in the setting of COVID infection.  Completed antibiotics initially on ceftriaxone/doxycycline changed to Zosyn>cefepime.  Clinically stable on room air, off airborne precaution. Recent Labs  Lab 08/01/20 0153 08/02/20 0636 08/03/20 0041 08/04/20 0117 08/05/20 0035  WBC 9.0 11.5* 10.1 9.9 23.5*   Nonoliguric AKI in the setting of CKD stage IIIa Baseline creatinine 1.5 peaked to 5.9 > downtrending BUT HOLDING IN MID 5.Foley in place   now in CBI-Ensure Foley is draining properly we will continue to monitor his renal function. Suspect ATN, his renal ultrasound showed bilateral renal cyst. losartan has been discontinued.Monitor BMP  daily while here. Nephro advised outpatient follow-up and signed off.  Recent Labs  Lab 08/01/20 0153 08/02/20 0636 08/03/20 0041 08/04/20 0117 08/05/20 0035  BUN 46* 47* 47* 50* 56*  CREATININE 5.83* 5.82* 5.75* 5.39* 5.55*   Intake/Output Summary (Last 24 hours) at 08/05/2020 1023 Last data filed at 08/05/2020 [id number] Gross per 24 hour  Intake 9100 ml  Output 11425 ml  Net -2325 ml   Hyperkalemia: BUN slightly up creatinine also trending up little bit, given dose of Lokelma , repeat BMP Recent Labs  Lab 08/01/20 0153 08/02/20 0636 08/03/20 0041 08/04/20 0117 08/05/20 0035  K 4.7 4.4 4.4 4.9 5.7*    Gross hematuria from Foley catheter site Foley catheter in place for urine retention BPH w/ traumatic Foley insertion Acute blood loss anemia due to hematuria Placed on CBI overnight 7/28-defer to urology for further plan continue belladonna/oxybutynin for spasm which seems to be controlled at this time. Moniot h/h. It is stable. Continue to hold Asa, heparin . Recent Labs  Lab 08/03/20 0041 08/03/20 1728 08/04/20 0117 08/04/20 1245 08/05/20 0035  HGB 7.2* 8.6* 7.8* 8.4* 9.7*  HCT 22.4* 27.0* 24.2* 25.7* 30.7*   Leukocytosis: Likely secondary to manipulation for CBI, no other clear source of infection.  Patient did receive ceftriaxone post manipulation by urology, ua fairly stable follow-up urine culture.  If has a fever or any other signs of infection low threshold to start antibiotics.  Acute on chronic systolic CHF EF 123456 , BNP elevated 4160-echo shows  7/24-EF 45 to 50% severe asymmetric left ventricular hypertrophy moderate MR monitor intake output .  CHF team input appreciated -not a candidate for ARB/ACE/ARN I due to AKI, BP too soft to add hydralazine/Imdur, Toprol-XL 12.5 mg  was added but discontinued due to high-grade AV block.  Diuretics on hold due to AKI  Net IO Since Admission: -5,300.46 mL [08/05/20 1023]   AV block/Pause 7/27 during sleep, symptomatic with jerks/LOC: cardiology notified Lopressor was discontinued  7/26.Mag and potassium checked and stable.  Continue plan as per cardiology doubt seizure never had episode like that before. Can check EEG for completeleness-discussed with patient's and wife they want to proceed with EEG.  CAD/CABG 1996/ischemic cardiomyopathy Not much pleural fluid effusion on review by IR thoracentesis canceled 7/25 Chest pain resolved.Seen by cardiology and had a stable mild troponin elevation no significant delta.-In the setting of infection likely.Aspirin held due to hematuria, resume statin.  T2DM: A1c 6.4, well controlled on sliding scale Recent Labs  Lab 08/04/20 0751 08/04/20 1139 08/04/20 1645 08/04/20 2047 08/05/20 0733  GLUCAP 102* 142* 149* 165* 136*  [id number] controlled, losartan discontinued 2/2 AKI. Back on metoprolol 7/25> but discontinued due to AV block 7/27  Mild hyponatremia monitor  Chronic back pain:stable  Electrolyte imbalance with hypokalemia/hypophosphatemia, hypomagnesemia-resolved.  Dementia with behavioral disturbance-stable. He is interactive and appropriate.    Depression- mood is stable. Diet Order             DIET SOFT Room service appropriate? No; Fluid consistency: Thin  Diet effective now                  Patient's Body mass index is 25.72 kg/m. DVT prophylaxis: Place and maintain sequential compression device Start: 07/31/20 2314 Code Status:   Code Status: DNR  Family Communication: plan of care discussed with patient a and his wife at the bedside. Status is: Inpatient Remains inpatient appropriate because:IV treatments appropriate due to intensity of illness or inability to take PO and Inpatient level of care appropriate due to severity of illness Dispo: The  patient is from: Home              Anticipated d/c is to: TBD. Ptot eval once hematuria resolves.              Patient currently is not medically stable to d/c.   Difficult to place patient No Unresulted Labs (From admission, onward)     Start     Ordered   08/04/20 1841  Urine Culture  Once,   R       Question:  Indication  Answer:  Bacteriuria screening (OB/GYN or Uro)   08/04/20 1841          Medications reviewed:  Scheduled Meds:  Chlorhexidine Gluconate Cloth  6 each Topical Daily   feeding supplement (NEPRO CARB STEADY)  237 mL Oral [id number]   folic acid  1 mg Oral Daily   insulin aspart  0-5 Units Subcutaneous QHS   insulin aspart  0-9 Units Subcutaneous TID WC   rosuvastatin  20 mg Oral QHS   tamsulosin  0.4 mg Oral QPC supper   Continuous Infusions:  sodium chloride irrigation     Consultants:see note  Procedures:see note Antimicrobials: Anti-infectives (From admission, onward)    Start     Dose/Rate Route Frequency Ordered Stop   08/04/20 1930  cefTRIAXone (ROCEPHIN) 2 g in sodium chloride 0.9 % 100 mL IVPB        2 g 200 mL/hr  over 30 Minutes Intravenous  Once 08/04/20 1841 08/05/20 0802   07/26/20 1800  ceFEPIme (MAXIPIME) 1 g in sodium chloride 0.9 % 100 mL IVPB        1 g 200 mL/hr over 30 Minutes Intravenous Every 24 hours 07/26/20 1110 07/29/20 0954   07/24/20 1800  piperacillin-tazobactam (ZOSYN) IVPB 2.25 g  Status:  Discontinued        2.25 g 100 mL/hr over 30 Minutes Intravenous Every 8 hours 07/24/20 1026 07/26/20 1109   07/24/20 1115  piperacillin-tazobactam (ZOSYN) IVPB 3.375 g        3.375 g 100 mL/hr over 30 Minutes Intravenous  Once 07/24/20 1022 07/24/20 1304   07/23/20 2200  doxycycline (VIBRA-TABS) tablet 100 mg  Status:  Discontinued        100 mg Oral Every 12 hours 07/23/20 1043 07/24/20 1022   07/23/20 2045  cefTRIAXone (ROCEPHIN) 2 g in sodium chloride 0.9 % 100 mL IVPB  Status:  Discontinued        2 g 200 mL/hr over 30 Minutes  Intravenous Every 24 hours 07/22/20 2116 07/24/20 1022   07/23/20 1806  metroNIDAZOLE (FLAGYL) IVPB 500 mg  Status:  Discontinued        500 mg 100 mL/hr over 60 Minutes Intravenous Every 8 hours 07/23/20 1724 07/24/20 1022   07/23/20 0845  doxycycline (VIBRAMYCIN) 100 mg in sodium chloride 0.9 % 250 mL IVPB  Status:  Discontinued        100 mg 125 mL/hr over 120 Minutes Intravenous Every 12 hours 07/22/20 2116 07/23/20 1043   07/22/20 2045  cefTRIAXone (ROCEPHIN) 1 g in sodium chloride 0.9 % 100 mL IVPB        1 g 200 mL/hr over 30 Minutes Intravenous  Once 07/22/20 2037 07/22/20 2319   07/22/20 2045  doxycycline (VIBRAMYCIN) 100 mg in sodium chloride 0.9 % 250 mL IVPB        100 mg 125 mL/hr over 120 Minutes Intravenous  Once 07/22/20 2037 07/23/20 0143      Culture/Microbiology    Component Value Date/Time   SDES THROAT 01/15/2010 [place] ADDED ON [id number] AT [place] 01/15/2010 0734   REPTSTATUS 01/17/2010 FINAL 01/15/2010 0734    Other culture-see note  Objective: Vitals: Today's Vitals   08/04/20 2223 08/05/20 0019 08/05/20 0400 08/05/20 0734  BP:  129/81 114/63 128/86  Pulse:  100 80 87  Resp:  (!) '23 19 20  '$ Temp:  98.3 F (36.8 C) 98 F (36.7 C) 98 F (36.7 C)  TempSrc:  Oral Oral Oral  SpO2:  96% 90% 97%  Weight:      Height:      PainSc: 10-Worst pain ever       Intake/Output Summary (Last 24 hours) at 08/05/2020 1023 Last data filed at 08/05/2020 [id number] Gross per 24 hour  Intake 9100 ml  Output 11425 ml  Net -2325 ml   Filed Weights   08/02/20 0210 08/03/20 1214 08/04/20 0421  Weight: 81.2 kg 81.2 kg 81.3 kg   Weight change:   Intake/Output from previous day: 07/28 0701 - 07/29 0700 In: 9100  Out: 11775 [id number] Intake/Output this shift: No intake/output data recorded. Filed Weights   08/02/20 0210 08/03/20 1214 08/04/20 0421  Weight: 81.2 kg 81.2 kg 81.3 kg   Examination: General exam: AAO at baseline, he likes to joke  around, HEENT:Oral mucosa moist, Ear/Nose WNL grossly, dentition normal. Respiratory system: bilaterally diminished, no use of accessory muscle Cardiovascular  system: S1 & S2 +, No JVD,. Gastrointestinal system: Abdomen soft,NT,ND, BS+ Nervous System:Alert, awake, moving extremities and grossly nonfocal Extremities: no edema, distal peripheral pulses palpable.  Skin: No rashes,no icterus. MSK: Normal muscle bulk,tone, power  Foley catheter present with CBI  Data Reviewed: I have personally reviewed following labs and imaging studies CBC: Recent Labs  Lab 08/01/20 0153 08/02/20 0636 08/03/20 0041 08/03/20 1728 08/04/20 0117 08/04/20 1245 08/05/20 0035  WBC 9.0 11.5* 10.1  --  9.9  --  23.5*  HGB 8.5* 8.1* 7.2* 8.6* 7.8* 8.4* 9.7*  HCT 25.6* 24.7* 22.4* 27.0* 24.2* 25.7* 30.7*  MCV 104.1* 105.6* 106.7*  --  101.3*  --  102.7*  PLT 237 281 275  --  279  --  0000000*   Basic Metabolic Panel: Recent Labs  Lab 07/30/20 0046 07/30/20 0432 07/31/20 0544 08/01/20 0153 08/02/20 0636 08/03/20 0041 08/03/20 1728 08/04/20 0117 08/05/20 0035  NA 133*  --  136 133* 134* 133*  --  132* 134*  K 4.4 4.0 3.9 4.7 4.4 4.4  --  4.9 5.7*  CL 99  --  100 102 101 102  --  100 96*  CO2 24  --  '25 23 26 23  '$ --  26 22  GLUCOSE 110*  --  98 110* 102* 106*  --  125* 246*  BUN 42*  --  43* 46* 47* 47*  --  50* 56*  CREATININE 5.72*  --  5.91* 5.83* 5.82* 5.75*  --  5.39* 5.55*  CALCIUM 7.6*  --  8.0* 7.9* 8.2* 8.1*  --  8.2* 9.2  MG  --  1.6* 2.0 1.9  --   --  2.0  --   --   PHOS 3.1  --  3.4 3.5  --   --   --   --   --    GFR: Estimated Creatinine Clearance: 10.2 mL/min (A) (by C-G formula based on SCr of 5.55 mg/dL (H)). Liver Function Tests: Recent Labs  Lab 07/30/20 0046 07/31/20 0544  ALBUMIN 2.2* 2.3*   No results for input(s): LIPASE, AMYLASE in the last 168 hours. No results for input(s): AMMONIA in the last 168 hours. Coagulation Profile: No results for input(s): INR, PROTIME in  the last 168 hours. Cardiac Enzymes: No results for input(s): CKTOTAL, CKMB, CKMBINDEX, TROPONINI in the last 168 hours. BNP (last 3 results) No results for input(s): PROBNP in the last 8760 hours. HbA1C: No results for input(s): HGBA1C in the last 72 hours. CBG: Recent Labs  Lab 08/04/20 0751 08/04/20 1139 08/04/20 1645 08/04/20 2047 08/05/20 0733  GLUCAP 102* 142* 149* 165* 136*   Lipid Profile: No results for input(s): CHOL, HDL, LDLCALC, TRIG, CHOLHDL, LDLDIRECT in the last 72 hours. Thyroid Function Tests: No results for input(s): TSH, T4TOTAL, FREET4, T3FREE, THYROIDAB in the last 72 hours. Anemia Panel: No results for input(s): VITAMINB12, FOLATE, FERRITIN, TIBC, IRON, RETICCTPCT in the last 72 hours. Sepsis Labs: No results for input(s): PROCALCITON, LATICACIDVEN in the last 168 hours.   No results found for this or any previous visit (from the past 240 hour(s)).   Radiology Studies: US PELVIS LIMITED (TRANSABDOMINAL ONLY)  Result Date: 08/04/2020 CLINICAL DATA:  Hematuria. EXAM: LIMITED ULTRASOUND OF PELVIS TECHNIQUE: Limited transabdominal ultrasound examination of the pelvis was performed. COMPARISON:  None. FINDINGS: Foley catheter is noted within urinary bladder. Prevoid volume was 76 mL. Post Foley drainage, urine volume was measured at 5 mm. Wall thickening of the urinary bladder is  noted, but this may be due to lack of distension. IMPRESSION: Foley catheter is noted in the urinary bladder. Wall thickening is noted, but this may be due to the lack of distension. Electronically Signed   By: Marijo Conception M.D.   On: 08/04/2020 17:25     LOS: 14 days   Antonieta Pert, MD [facility]  08/05/2020, 10:23 AM

## 2020-08-05 NOTE — Progress Notes (Signed)
Progress Note  Patient Name: Philip Kerr Date of Encounter: 08/05/2020  Primary Cardiologist: Lauree Chandler, MD   Subjective   No seizure like events over night.  Wife notes patient is a bit confused.  Patient jokingly mentioned he has not seen a heart doctor before (recent eval for NSVT, has had prior CABG 1996).  Inpatient Medications    Scheduled Meds:  Chlorhexidine Gluconate Cloth  6 each Topical Daily   feeding supplement (NEPRO CARB STEADY)  237 mL Oral [id number]   folic acid  1 mg Oral Daily   insulin aspart  0-5 Units Subcutaneous QHS   insulin aspart  0-9 Units Subcutaneous TID WC   rosuvastatin  20 mg Oral QHS   tamsulosin  0.4 mg Oral QPC supper   Continuous Infusions:  sodium chloride irrigation     PRN Meds: acetaminophen, opium-belladonna, guaiFENesin-dextromethorphan, lidocaine, ondansetron **OR** ondansetron (ZOFRAN) IV, oxybutynin, oxyCODONE, polyethylene glycol, polyvinyl alcohol, senna-docusate, traZODone   Vital Signs    Vitals:   08/04/20 1941 08/05/20 0019 08/05/20 0400 08/05/20 0734  BP: (!) 150/81 129/81 114/63 128/86  Pulse: 83 100 80 87  Resp: 20 (!) '23 19 20  '$ Temp: 98.1 F (36.7 C) 98.3 F (36.8 C) 98 F (36.7 C) 98 F (36.7 C)  TempSrc: Oral Oral Oral Oral  SpO2: 92% 96% 90% 97%  Weight:      Height:        Intake/Output Summary (Last 24 hours) at 08/05/2020 0802 Last data filed at 08/05/2020 [id number] Gross per 24 hour  Intake 9100 ml  Output 11775 ml  Net -2675 ml   Filed Weights   08/02/20 0210 08/03/20 1214 08/04/20 0421  Weight: 81.2 kg 81.2 kg 81.3 kg    Telemetry    SR, NSVT is actually just PVCs - Personally Reviewed  ECG    SR with PACs, Rate 74,LBBB - Personally Reviewed  Physical Exam   GEN: No acute distress.   Neck: No JVD Cardiac: RRR, no murmurs, rubs, or gallops.  Respiratory: Clear to auscultation bilaterally. GI: Soft, nontender, non-distended  MS: No edema; No deformity. Neuro:  Nonfocal   Psych: Normal affect   Labs    Chemistry Recent Labs  Lab 07/30/20 0046 07/30/20 0432 07/31/20 0544 08/01/20 0153 08/03/20 0041 08/04/20 0117 08/05/20 0035  NA 133*  --  136   < > 133* 132* 134*  K 4.4   < > 3.9   < > 4.4 4.9 5.7*  CL 99  --  100   < > 102 100 96*  CO2 24  --  25   < > '23 26 22  '$ GLUCOSE 110*  --  98   < > 106* 125* 246*  BUN 42*  --  43*   < > 47* 50* 56*  CREATININE 5.72*  --  5.91*   < > 5.75* 5.39* 5.55*  CALCIUM 7.6*  --  8.0*   < > 8.1* 8.2* 9.2  ALBUMIN 2.2*  --  2.3*  --   --   --   --   GFRNONAA 9*  --  9*   < > 9* 10* 9*  ANIONGAP 10  --  11   < > 8 6 16*   < > = values in this interval not displayed.     Hematology Recent Labs  Lab 08/03/20 0041 08/03/20 1728 08/04/20 0117 08/04/20 1245 08/05/20 0035  WBC 10.1  --  9.9  --  23.5*  RBC 2.10*  --  2.39*  --  2.99*  HGB 7.2*   < > 7.8* 8.4* 9.7*  HCT 22.4*   < > 24.2* 25.7* 30.7*  MCV 106.7*  --  101.3*  --  102.7*  MCH 34.3*  --  32.6  --  32.4  MCHC 32.1  --  32.2  --  31.6  RDW 14.2  --  18.7*  --  18.3*  PLT 275  --  279  --  420*   < > = values in this interval not displayed.    Cardiac EnzymesNo results for input(s): TROPONINI in the last 168 hours. No results for input(s): TROPIPOC in the last 168 hours.   BNP Recent Labs  Lab 07/31/20 0544  BNP 4,160.4*     DDimer No results for input(s): DDIMER in the last 168 hours.   Radiology    US PELVIS LIMITED (TRANSABDOMINAL ONLY)  Result Date: 08/04/2020 CLINICAL DATA:  Hematuria. EXAM: LIMITED ULTRASOUND OF PELVIS TECHNIQUE: Limited transabdominal ultrasound examination of the pelvis was performed. COMPARISON:  None. FINDINGS: Foley catheter is noted within urinary bladder. Prevoid volume was 76 mL. Post Foley drainage, urine volume was measured at 5 mm. Wall thickening of the urinary bladder is noted, but this may be due to lack of distension. IMPRESSION: Foley catheter is noted in the urinary bladder. Wall thickening is  noted, but this may be due to the lack of distension. Electronically Signed   By: Marijo Conception M.D.   On: 08/04/2020 17:25    Cardiac Studies   Transthoracic Echocardiogram: Date: 07/31/20 Results: 1. Left ventricular ejection fraction, by estimation, is 45 to 50%. The  left ventricle has mildly decreased function. Left ventricular endocardial  border not optimally defined to evaluate regional wall motion. There is  severe asymmetric left ventricular   hypertrophy of the septal segment. Left ventricular diastolic parameters  are indeterminate.   2. Right ventricular systolic function is normal. The right ventricular  size is normal. Tricuspid regurgitation signal is inadequate for assessing  PA pressure.   3. Left atrial size was mildly dilated.   4. The mitral valve is abnormal. Moderate mitral valve regurgitation. No  evidence of mitral stenosis.   5. Tricuspid valve regurgitation is mild to moderate.   6. The aortic valve is tricuspid. There is mild calcification of the  aortic valve. There is mild thickening of the aortic valve.   7. Aortic dilatation noted. There is mild dilatation of the aortic root,  measuring 40 mm.   NM Stress Testing : Date: 05/21/2012 Results: No perfusion defect   Patient Profile     87 y.o. male hx CAD s/p CABG x5V in 1996, chronic LBBB, Ischemic CM, Moderate MR HTN, HLD, type 2 DM, CKD stage III, BPH, childhood left lobectomy, currently admitted for COVID-19 PNA and is being followed by cardiology intermittently this admission for CP, NSVT, and now pauses on telemetry.    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, Stage B-C, euvolemic, etiology from  Active Covid-19 HTN and DM AKI on CKD Stage IV - given significant pause in the past, would likely defer AV nodal agent - ARB held in the setting of kidney function - ASA held in the setting of he hematuria - continue rosuvastatin - if improved kidney function may need return  to low dose lasix; this may need to be restarted as an outpatient - if blood pressure continues to rebound, norvasc 5 m can be  returned  Long term [facility] would be useful prior to discussing re-challenge of BB (has indication for GDMT) and CRT-P vs more conservative therapy; at this time more conservative therapy (holding BB) is appropriate   [facility] will sign off.   Medication Recommendations:  holding lasix until outpatient assessment +/- recovery of kidney function; hold off BB challenge Other recommendations (labs, testing, etc):  needs follow up BMP with [facility] or primary Follow up as an outpatient:  we are working to arrange f/u  For questions or updates, please contact [place] Please consult [email or web address] for contact info under Cardiology/STEMI.      Signed, Werner Lean, MD  08/05/2020, 11:31 AM

## 2020-08-05 NOTE — Progress Notes (Signed)
EEG complete - results pending 

## 2020-08-05 NOTE — Treatment Plan (Signed)
Update: urine clear on slow drip so the continuous bladder irrigation was clamped.   Plan:  -Continue foley catheter for 1 week. A voiding trial and gross hematuria work-up in the outpatient setting has been requested with [facility] and patient will be contacted to schedule this.  - If patient is still hospitalized in 1 week, we can perform a voiding trial while he is inpatient.   Discussed with Dr. Lear Ng, MD [facility]

## 2020-08-05 NOTE — Progress Notes (Signed)
Occupational Therapy Treatment Patient Details Name: Philip Kerr MRN: [id number] DOB: 03-Nov-1933 Today's Date: 08/05/2020    History of present illness 87 y.o. male presented 07/22/20 to the emergency department with chest pain. His work-up in ED was significant for transaminitis, pneumonia, as well he was COVID-19 positive, hospital course was complicated by severe delirium, and worsening renal failure.  PMH significant for coronary artery disease with CABG in 1996, ischemic cardiomyopathy, type 2 diabetes mellitus, hypertension, and dementia.   OT comments  Patient supine in bed when therapist entered the room. Patient min assist for hand hold to transfer to side of bed with use of bed rails. Patient encouraged to cough and clear throat secretions. Before therapist could set up for oral care task patient had episode of dry heaving and wanting to return to bed. With max encouragement patient sat edge of bed as therapist washed his back to promote increased activity tolerance out of bed. PT in room to treat patient - OT present for +2 ambulation as patient impulsive and doesn't pay attention to his catheter and lines/leads. makes quick moves and turns and picks up walker. Patient exhibited surprising ability to mobilize despite increased time in hospital. Continue to recommend acute care OT. Expect patient to progress well and especially if lines/leads and catheter removed.     Follow Up Recommendations  No OT follow up    Equipment Recommendations  None recommended by OT    Recommendations for Other Services      Precautions / Restrictions Precautions Precautions: Fall Precaution Comments: HOH, impulsive Restrictions Weight Bearing Restrictions: No       Mobility Bed Mobility Overal bed mobility: Needs Assistance Bed Mobility: Supine to Sit     Supine to sit: Min assist;HOB elevated     General bed mobility comments: Min assist for hand hold to transfer to side of bed.  Patient using bed rails.    Transfers                      Balance Overall balance assessment: Needs assistance Sitting-balance support: No upper extremity supported;Feet supported Sitting balance-Leahy Scale: Good Sitting balance - Comments: able to sit edge of bed.                                   ADL either performed or assessed with clinical judgement   ADL Overall ADL's : Needs assistance/impaired         Upper Body Bathing: Set up;Moderate assistance Upper Body Bathing Details (indicate cue type and reason): Patient transferred to side of bed - before therapist able to set patient up for oral care task patient exhibited an episode of dry heaving. Patient requesting to lay back down. Required encouragement to sit up. Patient complaining of back itching. Therapist washed and dried patient's back for comfort.                                 Vision Patient Visual Report: No change from baseline     Perception     Praxis      Cognition Arousal/Alertness: Awake/alert Behavior During Therapy: Impulsive Overall Cognitive Status: Impaired/Different from baseline Area of Impairment: Safety/judgement;Awareness                           Awareness: Emergent  Problem Solving: Requires verbal cues General Comments: Patient's wife reports patient's cognition still impaired compared to baseline.        Exercises     Shoulder Instructions       General Comments      Pertinent Vitals/ Pain       Pain Assessment: No/denies pain  Home Living                                          Prior Functioning/Environment              Frequency  Min 2X/week        Progress Toward Goals  OT Goals(current goals can now be found in the care plan section)  Progress towards OT goals: Progressing toward goals  Acute Rehab OT Goals Patient Stated Goal: do what i want to do OT Goal Formulation: With  patient Time For Goal Achievement: 08/10/20 Potential to Achieve Goals: Good  Plan Discharge plan remains appropriate    Co-evaluation          OT goals addressed during session: Other (comment) (activity tolerance, functional mobility)      AM-PAC OT "6 Clicks" Daily Activity     Outcome Measure   Help from another person eating meals?: None Help from another person taking care of personal grooming?: A Little Help from another person toileting, which includes using toliet, bedpan, or urinal?: A Little Help from another person bathing (including washing, rinsing, drying)?: A Little Help from another person to put on and taking off regular upper body clothing?: A Little Help from another person to put on and taking off regular lower body clothing?: A Little 6 Click Score: 19    End of Session    OT Visit Diagnosis: Unsteadiness on feet (R26.81);Muscle weakness (generalized) (M62.81);Other symptoms and signs involving cognitive function   Activity Tolerance Patient tolerated treatment well   Patient Left in bed;with call bell/phone within reach;with bed alarm set;with family/visitor present   Nurse Communication Mobility status        Time: [id number] OT Time Calculation (min): 8 min  Charges: OT General Charges $OT Visit: 1 Visit OT Treatments $Therapeutic Activity: 8-22 mins  Derl Barrow, OTR/L [place]  Office [phone] Pager: [place] 08/05/2020, 10:47 AM

## 2020-08-05 NOTE — Procedures (Signed)
Patient Name: Philip Kerr  MRN: [id number]  Epilepsy Attending: Lora Havens  Referring Physician/Provider: Dr Antonieta Pert Date: 08/05/2020 Duration: 23.40 mins  Patient history: 87yo M with ams. EEG to evaluate for seizure  Level of alertness: Awake, asleep  AEDs during EEG study: None  Technical aspects: This EEG study was done with scalp electrodes positioned according to the 10-20 International system of electrode placement. Electrical activity was acquired at a sampling rate of '500Hz'$  and reviewed with a high frequency filter of '70Hz'$  and a low frequency filter of '1Hz'$ . EEG data were recorded continuously and digitally stored.   Description: The posterior dominant rhythm consists of 9 Hz activity of moderate voltage (25-35 uV) seen predominantly in posterior head regions, symmetric and reactive to eye opening and eye closing. Sleep was characterized by vertex waves, sleep spindles (12 to 14 Hz), maximal frontocentral region. EEG showed intermittent generalized 3 to 6 Hz theta-delta slowing. Hyperventilation and photic stimulation were not performed.     ABNORMALITY - Intermittent slow, generalized  IMPRESSION: This study is suggestive of mild diffuse encephalopathy, nonspecific etiology. No seizures or epileptiform discharges were seen throughout the recording.  Thatcher Doberstein Barbra Sarks

## 2020-08-05 NOTE — Progress Notes (Addendum)
Patient continues to complain of pain after all available PRN meds given.  Reached out to urology and they defered to primary.  Have reached out to primary, message seen by MD with no response at this time.   12:20 AM  MD responded, Orders received

## 2020-08-05 NOTE — Consult Note (Signed)
   [facility] CM Inpatient Consult   08/05/2020  ELLIAN DURKEE [birth year] [id number]  [place] [facility] [ACO] Patient: [employer]  LLOS review:  13 days  Patient screened for length of stay hospitalization with noted high risk score for unplanned readmission risk and to assess for potential [facility] service needs for post hospital transition.  Review of patient's medical record reveals patient is being recommended for home with home health care. Ongoing medical treatment noted.   Plan:  Continue to follow progress and disposition to assess for post hospital care management needs.    For questions contact:   Natividad Brood, RN BSN [facility] Liaison  [phone] business mobile phone Toll free office [phone]  Fax number: [phone] [email or web address]

## 2020-08-06 DIAGNOSIS — N179 Acute kidney failure, unspecified: Secondary | ICD-10-CM | POA: Diagnosis not present

## 2020-08-06 LAB — BASIC METABOLIC PANEL
Anion gap: 8 (ref 5–15)
Anion gap: 8 (ref 5–15)
BUN: 59 mg/dL — ABNORMAL HIGH (ref 8–23)
BUN: 61 mg/dL — ABNORMAL HIGH (ref 8–23)
CO2: 25 mmol/L (ref 22–32)
CO2: 25 mmol/L (ref 22–32)
Calcium: 8.5 mg/dL — ABNORMAL LOW (ref 8.9–10.3)
Calcium: 8.3 mg/dL — ABNORMAL LOW (ref 8.9–10.3)
Chloride: 101 mmol/L (ref 98–111)
Chloride: 101 mmol/L (ref 98–111)
Creatinine, Ser: 5.37 mg/dL — ABNORMAL HIGH (ref 0.61–1.24)
Creatinine, Ser: 5.04 mg/dL — ABNORMAL HIGH (ref 0.61–1.24)
GFR, Estimated: 10 mL/min — ABNORMAL LOW (ref >60)
GFR, Estimated: 11 mL/min — ABNORMAL LOW (ref >60)
Glucose, Bld: 100 mg/dL — ABNORMAL HIGH (ref 70–99)
Glucose, Bld: 111 mg/dL — ABNORMAL HIGH (ref 70–99)
Potassium: 5.4 mmol/L — ABNORMAL HIGH (ref 3.5–5.1)
Potassium: 5.3 mmol/L — ABNORMAL HIGH (ref 3.5–5.1)
Sodium: 134 mmol/L — ABNORMAL LOW (ref 135–145)
Sodium: 134 mmol/L — ABNORMAL LOW (ref 135–145)

## 2020-08-06 LAB — CBC
HCT: 24.6 % — ABNORMAL LOW (ref 39.0–52.0)
Hemoglobin: 8 g/dL — ABNORMAL LOW (ref 13.0–17.0)
MCH: 33.1 pg (ref 26.0–34.0)
MCHC: 32.5 g/dL (ref 30.0–36.0)
MCV: 101.7 fL — ABNORMAL HIGH (ref 80.0–100.0)
Platelets: 281 10*3/uL (ref 150–400)
RBC: 2.42 MIL/uL — ABNORMAL LOW (ref 4.22–5.81)
RDW: 18.1 % — ABNORMAL HIGH (ref 11.5–15.5)
WBC: 10.8 10*3/uL — ABNORMAL HIGH (ref 4.0–10.5)
nRBC: 0 % (ref 0.0–0.2)

## 2020-08-06 LAB — GLUCOSE, CAPILLARY
Glucose-Capillary: 133 mg/dL — ABNORMAL HIGH (ref 70–99)
Glucose-Capillary: 135 mg/dL — ABNORMAL HIGH (ref 70–99)
Glucose-Capillary: 115 mg/dL — ABNORMAL HIGH (ref 70–99)
Glucose-Capillary: 131 mg/dL — ABNORMAL HIGH (ref 70–99)

## 2020-08-06 MED ORDER — SODIUM ZIRCONIUM CYCLOSILICATE 10 G PO PACK
10.0000 g | PACK | ORAL | Status: AC
  Administered 2020-08-06: 10 g via ORAL
  Filled 2020-08-06: qty 1

## 2020-08-06 NOTE — Progress Notes (Signed)
PROGRESS NOTE    Philip Kerr  [id number] DOB: 1933/04/06 DOA: 07/22/2020 PCP: Biagio Borg, MD   Chief Complaint  Patient presents with   Chest Pain   Brief Narrative: 87 year old male with CAD, CABG 1996/ischemic cardiomyopathy, chronic systolic CHF EF 123456, 123456, HTN presented with chest pain, nausea on had chronic cough that worsened past 3 to 4 days prior to admission and developed chest pain while at rest.  In the ED minimal and stable elevated troponin without EKG changes so was sent home from ED but immediately returned due to worsening chest pain weakness and cough and work-up found transaminitis, possible bacterial pneumonia, COVID-19 positive.  Hospital course complicated by severe delirium that has now resolved, acute urine retention with Foley inserted by urology due to difficulty and worsening renal failure, was seen by nephrology and they have signed off with outpatient follow-up recommendation. Patient started having hematuria from the penile Foley heparin and aspirin were discontinued-urology has been following. Taken off covid isolation 08/02/20. Due to ongoing hematuria CBI was placed hematuria resolved CBI clamped 7/29.  Urology is planning for outpatient voiding trial.  Subjective: CBI clamped yesterday as hematuria resolved Overnight potassium up but downtrending WBC count improved Foley not draining this morning, flushed by nursing staff and not draining.  Urine is clear.  Patient has no complaints.  His wife is at the bedside.  Assessment & Plan:  Left lower lobe bacterial pneumonia COVID-19 positive procalcitonin peaked at 20 > downtrended to 5.3, consistent with bacterial pneumonia in the setting of COVID infection.  Completed antibiotics.respiratory status stable he is on room air. off airborne precaution. Recent Labs  Lab 08/02/20 0636 08/03/20 0041 08/04/20 0117 08/05/20 0035 08/06/20 0047  WBC 11.5* 10.1 9.9 23.5* 10.8*   Nonoliguric AKI in the  setting of CKD stage IIIa Baseline creatinine 1.5 peaked to 5.9 > seen by nephrology suspected ATN, ultrasound showed bilateral renal cyst.  Creatinine mid 5 range , voiding on foley.will need close outpatient follow-up with BMP and with nephrology.  Monitor urine output. Recent Labs  Lab 08/03/20 0041 08/04/20 0117 08/05/20 0035 08/05/20 1809 08/06/20 0047  BUN 47* 50* 56* 59* 59*  CREATININE 5.75* 5.39* 5.55* 5.38* 5.37*   Intake/Output Summary (Last 24 hours) at 08/06/2020 1055 Last data filed at 08/06/2020 0942 Gross per 24 hour  Intake 300 ml  Output 6825 ml  Net -6525 ml   Hyperkalemia: S.P Lokelma potassium is still up we will give 1 dose of Lokelma -repeat BMP later today and in the morning. Recent Labs  Lab 08/03/20 0041 08/04/20 0117 08/05/20 0035 08/05/20 1809 08/06/20 0047  K 4.4 4.9 5.7* 5.8* 5.4*   Gross hematuria from Foley catheter site Foley catheter in place for urine retention BPH w/ traumatic Foley insertion Acute blood loss anemia due to hematuria Placed on CBI overnight 7/28-CBI clamped 7/29 and now plan is for outpatient follow-up for voiding trial.  Keep the Foley catheter in place, flush as needed. Moniot h/h. It is stable. Continue to hold Asa, heparin . Recent Labs  Lab 08/03/20 1728 08/04/20 0117 08/04/20 1245 08/05/20 0035 08/06/20 0047  HGB 8.6* 7.8* 8.4* 9.7* 8.0*  HCT 27.0* 24.2* 25.7* 30.7* 24.6*   Leukocytosis: Likely secondary to manipulation for CBI, no other clear source of infection.  Leukocytosis resolved.  Patient did receive ceftriaxone post manipulation by urology.  Acute on chronic systolic CHF EF 123456 , BNP elevated 4160-echo shows  7/24-EF 45 to 50% severe asymmetric left ventricular hypertrophy  moderate MR monitor intake output .  CHF team input appreciated -not a candidate for ARB/ACE/ARN I due to AKI, BP too soft to add hydralazine/Imdur, Toprol-XL 12.5 mg was added but discontinued due to high-grade AV block.  Diuretics on  hold due to AKI Net IO Since Admission: -11,825.46 mL [08/06/20 1055]   [name] is/AV block 7/27 during sleep, symptomatic with jerks/LOC: Seen by cardiology, Lopressor was recently started  7/26> discontinued.  Avoid beta-blocker per cardiology.  Consider Norvasc if blood pressure high.  GOC would be useful prior to discussing rechallenge of beta-blocker.EEG no seizure.  CAD/CABG 1996/ischemic cardiomyopathy Not much pleural fluid effusion on review by IR thoracentesis canceled 7/25 Chest pain resolved.Seen by cardiology and had a stable mild troponin elevation no significant delta.-In the setting of infection likely.Aspirin held due to hematuria, resume statin.  T2DM: A1c 6.4, controlled on sliding scale Recent Labs  Lab 08/05/20 0733 08/05/20 1200 08/05/20 1700 08/05/20 2040 08/06/20 0814  GLUCAP 136* 114* 122* 130* 133*  HTN: Well-controlled, losartan discontinued 2/2 AKI. Back on metoprolol 7/25> but discontinued due to AV block 7/27.  Mild hyponatremia stable  Chronic back pain:stable  Electrolyte imbalance with hypokalemia/hypophosphatemia, hypomagnesemia-resolved.  Dementia with behavioral disturbance-stable. He is interactive and appropriate.    Depression- mood is stable.  Goals of care: Does not want any heroic measures confirmed DNR Diet Order             DIET SOFT Room service appropriate? No; Fluid consistency: Thin  Diet effective now                  Patient's Body mass index is 25.62 kg/m. DVT prophylaxis: Place and maintain sequential compression device Start: 07/31/20 2314 Code Status:   Code Status: DNR  Family Communication: plan of care discussed with patient a and his wife at the bedside. Status is: Inpatient Remains inpatient appropriate because:IV treatments appropriate due to intensity of illness or inability to take PO and Inpatient level of care appropriate due to severity of illness Dispo: The patient is from: Home              Anticipated  d/c is to: Likely next 24 hours Home with home health              Patient currently is not medically stable to d/c.   Difficult to place patient No Unresulted Labs (From admission, onward)     Start     Ordered   08/06/20 [id number]  Basic metabolic panel  Once-Timed,   TIMED       Question:  Specimen collection method  Answer:  Lab=Lab collect   08/06/20 0741   08/06/20 [id number]  Basic metabolic panel  Daily,   R     Question:  Specimen collection method  Answer:  Lab=Lab collect   08/05/20 1030   08/06/20 0500  CBC  Daily,   R     Question:  Specimen collection method  Answer:  Lab=Lab collect   08/05/20 1030   08/04/20 1841  Urine Culture  Once,   R       Question:  Indication  Answer:  Bacteriuria screening (OB/GYN or Uro)   08/04/20 1841          Medications reviewed:  Scheduled Meds:  Chlorhexidine Gluconate Cloth  6 each Topical Daily   feeding supplement (NEPRO CARB STEADY)  237 mL Oral [id number]   folic acid  1 mg Oral Daily   insulin aspart  0-5 Units Subcutaneous  QHS   insulin aspart  0-9 Units Subcutaneous TID WC   rosuvastatin  20 mg Oral QHS   tamsulosin  0.4 mg Oral QPC supper   Continuous Infusions:  sodium chloride irrigation     Consultants:see note  Procedures:see note Antimicrobials: Anti-infectives (From admission, onward)    Start     Dose/Rate Route Frequency Ordered Stop   08/04/20 1930  cefTRIAXone (ROCEPHIN) 2 g in sodium chloride 0.9 % 100 mL IVPB        2 g 200 mL/hr over 30 Minutes Intravenous  Once 08/04/20 1841 08/05/20 0802   07/26/20 1800  ceFEPIme (MAXIPIME) 1 g in sodium chloride 0.9 % 100 mL IVPB        1 g 200 mL/hr over 30 Minutes Intravenous Every 24 hours 07/26/20 1110 07/29/20 0954   07/24/20 1800  piperacillin-tazobactam (ZOSYN) IVPB 2.25 g  Status:  Discontinued        2.25 g 100 mL/hr over 30 Minutes Intravenous Every 8 hours 07/24/20 1026 07/26/20 1109   07/24/20 1115  piperacillin-tazobactam (ZOSYN) IVPB 3.375 g        3.375 g 100  mL/hr over 30 Minutes Intravenous  Once 07/24/20 1022 07/24/20 1304   07/23/20 2200  doxycycline (VIBRA-TABS) tablet 100 mg  Status:  Discontinued        100 mg Oral Every 12 hours 07/23/20 1043 07/24/20 1022   07/23/20 2045  cefTRIAXone (ROCEPHIN) 2 g in sodium chloride 0.9 % 100 mL IVPB  Status:  Discontinued        2 g 200 mL/hr over 30 Minutes Intravenous Every 24 hours 07/22/20 2116 07/24/20 1022   07/23/20 1806  metroNIDAZOLE (FLAGYL) IVPB 500 mg  Status:  Discontinued        500 mg 100 mL/hr over 60 Minutes Intravenous Every 8 hours 07/23/20 1724 07/24/20 1022   07/23/20 0845  doxycycline (VIBRAMYCIN) 100 mg in sodium chloride 0.9 % 250 mL IVPB  Status:  Discontinued        100 mg 125 mL/hr over 120 Minutes Intravenous Every 12 hours 07/22/20 2116 07/23/20 1043   07/22/20 2045  cefTRIAXone (ROCEPHIN) 1 g in sodium chloride 0.9 % 100 mL IVPB        1 g 200 mL/hr over 30 Minutes Intravenous  Once 07/22/20 2037 07/22/20 2319   07/22/20 2045  doxycycline (VIBRAMYCIN) 100 mg in sodium chloride 0.9 % 250 mL IVPB        100 mg 125 mL/hr over 120 Minutes Intravenous  Once 07/22/20 2037 07/23/20 0143      Culture/Microbiology    Component Value Date/Time   SDES THROAT 01/15/2010 [place] ADDED ON [id number] AT [place] 01/15/2010 0734   REPTSTATUS 01/17/2010 FINAL 01/15/2010 0734    Other culture-see note  Objective: Vitals: Today's Vitals   08/06/20 0103 08/06/20 0500 08/06/20 0558 08/06/20 0809  BP: 119/85  133/67 (!) 128/56  Pulse: 76  84 82  Resp: '20  19 20  '$ Temp: 98 F (36.7 C)  98.3 F (36.8 C) 98.7 F (37.1 C)  TempSrc: Oral  Oral Oral  SpO2: 93%  94% 94%  Weight:  81 kg    Height:      PainSc:        Intake/Output Summary (Last 24 hours) at 08/06/2020 1055 Last data filed at 08/06/2020 0942 Gross per 24 hour  Intake 300 ml  Output 6825 ml  Net -6525 ml   Filed Weights   08/03/20 1214  08/04/20 0421 08/06/20 0500  Weight: 81.2 kg 81.3 kg 81 kg    Weight change:   Intake/Output from previous day: 07/29 0701 - 07/30 0700 In: 200  Out: 3775 [Urine:3775] Intake/Output this shift: Total I/O In: 100 [Other:100] Out: 3050 [Urine:3050] Filed Weights   08/03/20 1214 08/04/20 0421 08/06/20 0500  Weight: 81.2 kg 81.3 kg 81 kg   Examination: General exam: AAO at baseline mental status, elderly gentleman pleasant, trying to joke around HEENT:Oral mucosa moist, Ear/Nose WNL grossly, dentition normal. Respiratory system: bilaterally diminished,  no use of accessory muscle Cardiovascular system: S1 & S2 +, No JVD,. Gastrointestinal system: Abdomen soft,NT,ND, BS+ Nervous System:Alert, awake, moving extremities and grossly nonfocal Extremities: no edema, distal peripheral pulses palpable.  Skin: No rashes,no icterus. MSK: Normal muscle bulk,tone, power  Foley catheter in place with clear urine in the bag  Data Reviewed: I have personally reviewed following labs and imaging studies CBC: Recent Labs  Lab 08/02/20 0636 08/03/20 0041 08/03/20 1728 08/04/20 0117 08/04/20 1245 08/05/20 0035 08/06/20 0047  WBC 11.5* 10.1  --  9.9  --  23.5* 10.8*  HGB 8.1* 7.2* 8.6* 7.8* 8.4* 9.7* 8.0*  HCT 24.7* 22.4* 27.0* 24.2* 25.7* 30.7* 24.6*  MCV 105.6* 106.7*  --  101.3*  --  102.7* 101.7*  PLT 281 275  --  279  --  420* [id number]   Basic Metabolic Panel: Recent Labs  Lab 07/31/20 0544 08/01/20 0153 08/02/20 0636 08/03/20 0041 08/03/20 1728 08/04/20 0117 08/05/20 0035 08/05/20 1809 08/06/20 0047  NA 136 133*   < > 133*  --  132* 134* 133* 134*  K 3.9 4.7   < > 4.4  --  4.9 5.7* 5.8* 5.4*  CL 100 102   < > 102  --  100 96* 99 101  CO2 25 23   < > 23  --  '26 22 26 25  '$ GLUCOSE 98 110*   < > 106*  --  125* 246* 154* 100*  BUN 43* 46*   < > 47*  --  50* 56* 59* 59*  CREATININE 5.91* 5.83*   < > 5.75*  --  5.39* 5.55* 5.38* 5.37*  CALCIUM 8.0* 7.9*   < > 8.1*  --  8.2* 9.2 8.8* 8.5*  MG 2.0 1.9  --   --  2.0  --   --   --   --   PHOS  3.4 3.5  --   --   --   --   --   --   --    < > = values in this interval not displayed.   GFR: Estimated Creatinine Clearance: 10.6 mL/min (A) (by C-G formula based on SCr of 5.37 mg/dL (H)). Liver Function Tests: Recent Labs  Lab 07/31/20 0544  ALBUMIN 2.3*   No results for input(s): LIPASE, AMYLASE in the last 168 hours. No results for input(s): AMMONIA in the last 168 hours. Coagulation Profile: No results for input(s): INR, PROTIME in the last 168 hours. Cardiac Enzymes: No results for input(s): CKTOTAL, CKMB, CKMBINDEX, TROPONINI in the last 168 hours. BNP (last 3 results) No results for input(s): PROBNP in the last 8760 hours. HbA1C: No results for input(s): HGBA1C in the last 72 hours. CBG: Recent Labs  Lab 08/05/20 0733 08/05/20 1200 08/05/20 1700 08/05/20 2040 08/06/20 0814  GLUCAP 136* 114* 122* 130* 133*   Lipid Profile: No results for input(s): CHOL, HDL, LDLCALC, TRIG, CHOLHDL, LDLDIRECT in the last 72 hours. Thyroid Function  Tests: No results for input(s): TSH, T4TOTAL, FREET4, T3FREE, THYROIDAB in the last 72 hours. Anemia Panel: No results for input(s): VITAMINB12, FOLATE, FERRITIN, TIBC, IRON, RETICCTPCT in the last 72 hours. Sepsis Labs: No results for input(s): PROCALCITON, LATICACIDVEN in the last 168 hours.   No results found for this or any previous visit (from the past 240 hour(s)).   Radiology Studies: US PELVIS LIMITED (TRANSABDOMINAL ONLY)  Result Date: 08/04/2020 CLINICAL DATA:  Hematuria. EXAM: LIMITED ULTRASOUND OF PELVIS TECHNIQUE: Limited transabdominal ultrasound examination of the pelvis was performed. COMPARISON:  None. FINDINGS: Foley catheter is noted within urinary bladder. Prevoid volume was 76 mL. Post Foley drainage, urine volume was measured at 5 mm. Wall thickening of the urinary bladder is noted, but this may be due to lack of distension. IMPRESSION: Foley catheter is noted in the urinary bladder. Wall thickening is noted, but  this may be due to the lack of distension. Electronically Signed   By: Marijo Conception M.D.   On: 08/04/2020 17:25   EEG adult  Result Date: 08/05/2020 Lora Havens, MD     08/05/2020 12:47 PM Patient Name: Philip Kerr MRN: [id number] Epilepsy Attending: Lora Havens Referring Physician/Provider: Dr Antonieta Pert Date: 08/05/2020 Duration: 23.40 mins Patient history: 87yo M with ams. EEG to evaluate for seizure Level of alertness: Awake, asleep AEDs during EEG study: None Technical aspects: This EEG study was done with scalp electrodes positioned according to the 10-20 International system of electrode placement. Electrical activity was acquired at a sampling rate of '500Hz'$  and reviewed with a high frequency filter of '70Hz'$  and a low frequency filter of '1Hz'$ . EEG data were recorded continuously and digitally stored. Description: The posterior dominant rhythm consists of 9 Hz activity of moderate voltage (25-35 uV) seen predominantly in posterior head regions, symmetric and reactive to eye opening and eye closing. Sleep was characterized by vertex waves, sleep spindles (12 to 14 Hz), maximal frontocentral region. EEG showed intermittent generalized 3 to 6 Hz theta-delta slowing. Hyperventilation and photic stimulation were not performed.   ABNORMALITY - Intermittent slow, generalized IMPRESSION: This study is suggestive of mild diffuse encephalopathy, nonspecific etiology. No seizures or epileptiform discharges were seen throughout the recording. Lora Havens     LOS: 15 days   Antonieta Pert, MD [facility]  08/06/2020, 10:55 AM

## 2020-08-06 NOTE — Plan of Care (Signed)

## 2020-08-07 DIAGNOSIS — I472 Ventricular tachycardia: Secondary | ICD-10-CM

## 2020-08-07 DIAGNOSIS — Z7189 Other specified counseling: Secondary | ICD-10-CM | POA: Diagnosis not present

## 2020-08-07 DIAGNOSIS — Z66 Do not resuscitate: Secondary | ICD-10-CM | POA: Diagnosis not present

## 2020-08-07 DIAGNOSIS — I4729 Other ventricular tachycardia: Secondary | ICD-10-CM

## 2020-08-07 DIAGNOSIS — N1831 Chronic kidney disease, stage 3a: Secondary | ICD-10-CM | POA: Diagnosis not present

## 2020-08-07 DIAGNOSIS — Z515 Encounter for palliative care: Secondary | ICD-10-CM | POA: Diagnosis not present

## 2020-08-07 DIAGNOSIS — I5022 Chronic systolic (congestive) heart failure: Secondary | ICD-10-CM | POA: Diagnosis not present

## 2020-08-07 LAB — BASIC METABOLIC PANEL
Anion gap: 10 (ref 5–15)
BUN: 62 mg/dL — ABNORMAL HIGH (ref 8–23)
CO2: 24 mmol/L (ref 22–32)
Calcium: 8.5 mg/dL — ABNORMAL LOW (ref 8.9–10.3)
Chloride: 101 mmol/L (ref 98–111)
Creatinine, Ser: 4.95 mg/dL — ABNORMAL HIGH (ref 0.61–1.24)
GFR, Estimated: 11 mL/min — ABNORMAL LOW (ref >60)
Glucose, Bld: 124 mg/dL — ABNORMAL HIGH (ref 70–99)
Potassium: 4.9 mmol/L (ref 3.5–5.1)
Sodium: 135 mmol/L (ref 135–145)

## 2020-08-07 LAB — GLUCOSE, CAPILLARY
Glucose-Capillary: 121 mg/dL — ABNORMAL HIGH (ref 70–99)
Glucose-Capillary: 121 mg/dL — ABNORMAL HIGH (ref 70–99)
Glucose-Capillary: 114 mg/dL — ABNORMAL HIGH (ref 70–99)
Glucose-Capillary: 136 mg/dL — ABNORMAL HIGH (ref 70–99)

## 2020-08-07 LAB — CBC
HCT: 24.8 % — ABNORMAL LOW (ref 39.0–52.0)
Hemoglobin: 8.1 g/dL — ABNORMAL LOW (ref 13.0–17.0)
MCH: 32.9 pg (ref 26.0–34.0)
MCHC: 32.7 g/dL (ref 30.0–36.0)
MCV: 100.8 fL — ABNORMAL HIGH (ref 80.0–100.0)
Platelets: 298 10*3/uL (ref 150–400)
RBC: 2.46 MIL/uL — ABNORMAL LOW (ref 4.22–5.81)
RDW: 17.9 % — ABNORMAL HIGH (ref 11.5–15.5)
WBC: 10.3 10*3/uL (ref 4.0–10.5)
nRBC: 0 % (ref 0.0–0.2)

## 2020-08-07 LAB — URINE CULTURE: Culture: >=100000 — AB

## 2020-08-07 LAB — MAGNESIUM: Magnesium: 1.7 mg/dL (ref 1.7–2.4)

## 2020-08-07 MED ORDER — AMIODARONE HCL 200 MG PO TABS: 400.0000 mg | ORAL_TABLET | Freq: Every day | ORAL | Status: DC

## 2020-08-07 MED ORDER — MAGNESIUM OXIDE -MG SUPPLEMENT 400 (240 MG) MG PO TABS
400.0000 mg | ORAL_TABLET | Freq: Two times a day (BID) | ORAL | Status: DC
  Administered 2020-08-07 – 2020-08-08 (×3): 400 mg via ORAL
  Filled 2020-08-07 (×3): qty 1

## 2020-08-07 MED ORDER — AMIODARONE HCL 200 MG PO TABS
400.0000 mg | ORAL_TABLET | Freq: Two times a day (BID) | ORAL | Status: DC
  Administered 2020-08-07 – 2020-08-08 (×3): 400 mg via ORAL
  Filled 2020-08-07 (×3): qty 2

## 2020-08-07 NOTE — Progress Notes (Signed)
Progress Note  Patient Name: Philip Kerr Date of Encounter: 08/07/2020  Primary Cardiologist: Lauree Chandler, MD   Subjective   08/06/20 (off BB for NSVT) had 45 s run of WCT  Patient was asymptomatic during this time.  Feels well with no complaints.  Inpatient Medications    Scheduled Meds:  Chlorhexidine Gluconate Cloth  6 each Topical Daily   feeding supplement (NEPRO CARB STEADY)  237 mL Oral [id number]   folic acid  1 mg Oral Daily   insulin aspart  0-5 Units Subcutaneous QHS   insulin aspart  0-9 Units Subcutaneous TID WC   magnesium oxide  400 mg Oral BID   rosuvastatin  20 mg Oral QHS   tamsulosin  0.4 mg Oral QPC supper   Continuous Infusions:  sodium chloride irrigation     PRN Meds: acetaminophen, opium-belladonna, guaiFENesin-dextromethorphan, lidocaine, ondansetron **OR** ondansetron (ZOFRAN) IV, oxybutynin, oxyCODONE, polyethylene glycol, polyvinyl alcohol, senna-docusate, traZODone   Vital Signs    Vitals:   08/07/20 0040 08/07/20 0334 08/07/20 0340 08/07/20 0745  BP: 132/63 134/62  (!) 134/58  Pulse: 83 79  81  Resp: '16 17  17  '$ Temp: 98.2 F (36.8 C) 98.1 F (36.7 C)  98 F (36.7 C)  TempSrc: Oral Oral  Oral  SpO2: 95% 95%  95%  Weight:   80 kg   Height:        Intake/Output Summary (Last 24 hours) at 08/07/2020 1001 Last data filed at 08/07/2020 [id number] Gross per 24 hour  Intake 5480 ml  Output 4975 ml  Net 505 ml   Filed Weights   08/04/20 0421 08/06/20 0500 08/07/20 0340  Weight: 81.3 kg 81 kg 80 kg    Telemetry    Runs of triplets and one episode of sustained WCT, irregular-> regular - Personally Reviewed  ECG    None new - Personally Reviewed  Physical Exam   GEN: No acute distress.   Neck: No JVD Cardiac: RRR, no murmurs, rubs, or gallops.  Respiratory: Clear to auscultation bilaterally. GI: Soft, nontender, non-distended  MS: No edema; No deformity. Neuro:  Nonfocal  Psych: Normal affect   Labs     Chemistry Recent Labs  Lab 08/06/20 0047 08/06/20 1608 08/07/20 0028  NA 134* 134* 135  K 5.4* 5.3* 4.9  CL 101 101 101  CO2 '25 25 24  '$ GLUCOSE 100* 111* 124*  BUN 59* 61* 62*  CREATININE 5.37* 5.04* 4.95*  CALCIUM 8.5* 8.3* 8.5*  GFRNONAA 10* 11* 11*  ANIONGAP '8 8 10     '$ Hematology Recent Labs  Lab 08/05/20 0035 08/06/20 0047 08/07/20 0028  WBC 23.5* 10.8* 10.3  RBC 2.99* 2.42* 2.46*  HGB 9.7* 8.0* 8.1*  HCT 30.7* 24.6* 24.8*  MCV 102.7* 101.7* 100.8*  MCH 32.4 33.1 32.9  MCHC 31.6 32.5 32.7  RDW 18.3* 18.1* 17.9*  PLT 420* 281 298    Cardiac EnzymesNo results for input(s): TROPONINI in the last 168 hours. No results for input(s): TROPIPOC in the last 168 hours.   BNP No results for input(s): BNP, PROBNP in the last 168 hours.    DDimer No results for input(s): DDIMER in the last 168 hours.   Radiology    EEG adult  Result Date: 08/05/2020 Lora Havens, MD     08/05/2020 12:47 PM Patient Name: Philip Kerr MRN: [id number] Epilepsy Attending: Lora Havens Referring Physician/Provider: Dr Antonieta Pert Date: 08/05/2020 Duration: 23.40 mins Patient history: 87yo M with ams. EEG to  evaluate for seizure Level of alertness: Awake, asleep AEDs during EEG study: None Technical aspects: This EEG study was done with scalp electrodes positioned according to the 10-20 International system of electrode placement. Electrical activity was acquired at a sampling rate of '500Hz'$  and reviewed with a high frequency filter of '70Hz'$  and a low frequency filter of '1Hz'$ . EEG data were recorded continuously and digitally stored. Description: The posterior dominant rhythm consists of 9 Hz activity of moderate voltage (25-35 uV) seen predominantly in posterior head regions, symmetric and reactive to eye opening and eye closing. Sleep was characterized by vertex waves, sleep spindles (12 to 14 Hz), maximal frontocentral region. EEG showed intermittent generalized 3 to 6 Hz theta-delta slowing.  Hyperventilation and photic stimulation were not performed.   ABNORMALITY - Intermittent slow, generalized IMPRESSION: This study is suggestive of mild diffuse encephalopathy, nonspecific etiology. No seizures or epileptiform discharges were seen throughout the recording. Lora Havens    Cardiac Studies   Transthoracic Echocardiogram: Date: 07/31/20 Results: 1. Left ventricular ejection fraction, by estimation, is 45 to 50%. The  left ventricle has mildly decreased function. Left ventricular endocardial  border not optimally defined to evaluate regional wall motion. There is  severe asymmetric left ventricular   hypertrophy of the septal segment. Left ventricular diastolic parameters  are indeterminate.   2. Right ventricular systolic function is normal. The right ventricular  size is normal. Tricuspid regurgitation signal is inadequate for assessing  PA pressure.   3. Left atrial size was mildly dilated.   4. The mitral valve is abnormal. Moderate mitral valve regurgitation. No  evidence of mitral stenosis.   5. Tricuspid valve regurgitation is mild to moderate.   6. The aortic valve is tricuspid. There is mild calcification of the  aortic valve. There is mild thickening of the aortic valve.   7. Aortic dilatation noted. There is mild dilatation of the aortic root,  measuring 40 mm.   NM Stress Testing : Date: 05/21/2012 Results: No perfusion defect   Patient Profile     87 y.o. male hx CAD s/p CABG x5V in 1996, chronic LBBB, Ischemic CM, Moderate MR HTN, HLD, type 2 DM, CKD stage III, BPH, childhood left lobectomy, currently admitted for COVID-19 PNA and is being followed by cardiology intermittently this admission for CP, NSVT, and now pauses on telemetry.   Assessment & Plan    WCT CAD s/p CABG HFmrEF - NYHA class I, Stage B-C, euvolemic, etiology from CAD Active Covid-19 HTN and DM AKI on CKD Stage IV - has not tolerated BB because of pauses; given HFrEF CCB would  not be appropriate and would have similar side effects - given Creatinine digoxin is not appropriate - WCT asymptomatic; we will try and suppress this with amiodarone (400 mg PO BID for 14 days then 400 mg PO Daily); if pauses would need consideration for PPM - ARB held in the setting of kidney function - ASA held in the setting of he hematuria - continue rosuvastatin  For questions or updates, please contact [facility] Please consult [email or web address] for contact info under Cardiology/STEMI.      Signed, Werner Lean, MD  08/07/2020, 10:01 AM

## 2020-08-07 NOTE — Progress Notes (Signed)
Patient with 11 beat run of Vtach. VSS. Patient asymptomatic. MD notified and RN advised to to continue to monitor.

## 2020-08-07 NOTE — Progress Notes (Signed)
Patient with 45 beat run of Vtach. This RN was at bedside when the event occurred and patient remained alert and communicative throughout. No reports of chest pain or SOB. Vital signs immediately assessed and remained stable. MD notified and orders received. Will continue to monitor.

## 2020-08-07 NOTE — Progress Notes (Signed)
PROGRESS NOTE    Philip Kerr  [id number] DOB: [birth year] DOA: 07/22/2020 PCP: Biagio Borg, MD   Chief Complaint  Patient presents with   Chest Pain   Brief Narrative: 87 year old male with CAD, CABG 1996/ischemic cardiomyopathy, chronic systolic CHF EF 123456, 123456, HTN presented with chest pain, nausea on had chronic cough that worsened past 3 to 4 days prior to admission and developed chest pain while at rest.  In the ED minimal and stable elevated troponin without EKG changes so was sent home from ED but immediately returned due to worsening chest pain weakness and cough and work-up found transaminitis, possible bacterial pneumonia, COVID-19 positive.  Hospital course complicated by severe delirium that has now resolved, acute urine retention with Foley inserted by urology due to difficulty and worsening renal failure, was seen by nephrology and they have signed off with outpatient follow-up recommendation. Patient started having hematuria from the penile Foley heparin and aspirin were discontinued-urology has been following. Taken off covid isolation 08/02/20. Due to ongoing hematuria CBI was placed hematuria resolved CBI clamped 7/29.  Urology is planning for outpatient voiding trial.  Subjective: 45 beats of NSVT last night and and 11 beats of VT this am-electrolytes were checked.Renal function improving Patient asymptomatic no new complaints, Foley catheter clear Wife is at the bedside  Assessment & Plan:  Left lower lobe bacterial pneumonia COVID-19 positive procalcitonin peaked at 20 > downtrended to 5.3, consistent with bacterial pneumonia in the setting of COVID infection.  Completed antibiotics.respiratory status stable .he is on room air encourage PT OT today.  off airborne precaution. Recent Labs  Lab 08/03/20 0041 08/04/20 0117 08/05/20 0035 08/06/20 0047 08/07/20 0028  WBC 10.1 9.9 23.5* 10.8* 10.3  Nonoliguric AKI in the setting of CKD stage IIIa Baseline  creatinine 1.5 peaked to 5.9 > seen by nephrology suspected ATN, ultrasound showed bilateral renal cyst.  Creatinine running in the 5 range today down to 4.9, having urine output, continue the Foley catheter with outpatient voiding trial.  Monitor BMP as outpatient follow-up with nephrology. Recent Labs  Lab 08/05/20 0035 08/05/20 1809 08/06/20 0047 08/06/20 1608 08/07/20 0028  BUN 56* 59* 59* 61* 62*  CREATININE 5.55* 5.38* 5.37* 5.04* 4.95*   Intake/Output Summary (Last 24 hours) at 08/07/2020 0958 Last data filed at 08/07/2020 0752 Gross per 24 hour  Intake 5480 ml  Output 4975 ml  Net 505 ml   Hyperkalemia: S.P Lokelma multiple doses.  Potassium has been stable, discussed about low potassium diet. Recent Labs  Lab 08/05/20 0035 08/05/20 1809 08/06/20 0047 08/06/20 1608 08/07/20 0028  K 5.7* 5.8* 5.4* 5.3* 4.9  Gross hematuria from Foley catheter site Foley catheter in place for urine retention BPH w/ traumatic Foley insertion Acute blood loss anemia due to hematuria Placed on CBI 7/28-CBI clamped 7/29 and now plan is for outpatient follow-up for voiding trial.  Await for further urology input.  Continue Foley catheter in place.  Monitor hemoglobin overall stable > 8 gm . continue to hold Asa, heparin . Recent Labs  Lab 08/04/20 0117 08/04/20 1245 08/05/20 0035 08/06/20 0047 08/07/20 0028  HGB 7.8* 8.4* 9.7* 8.0* 8.1*  HCT 24.2* 25.7* 30.7* 24.6* 24.8*   Leukocytosis: Likely secondary to manipulation for CBI, no other clear source of infection.  Leukocytosis resolved and afebrile. Patient did receive ceftriaxone post manipulation by urology.  Acute on chronic systolic CHF EF 123456 , BNP elevated 4160-echo shows  7/24-EF 45 to 50% severe asymmetric left ventricular hypertrophy moderate  MR monitor intake output .  CHF team input appreciated -not a candidate for ARB/ACE/ARN I due to AKI, BP too soft to add hydralazine/Imdur, Toprol-XL 12.5 mg was added but discontinued due to  high-grade AV block.  Diuretics on hold due to AKI Net IO Since Admission: -14,010.46 mL [08/07/20 0958]   NSVT: Multiple beats of V. tach last night this morning potassium 4.9 mag 1.7 we will add mag oxide.  Notifed cardiology team. Difficult situation as unable to use beta-blocker due to  pause.  Pause/AV block 7/27 during sleep, symptomatic with jerks/LOC: Seen by cardiology, Lopressor was recently started  7/26> discontinued.  Avoid beta-blocker per cardiology.  Consider Norvasc if blood pressure high.  GOC would be useful prior to discussing rechallenge of beta-blocker. He is DNR already. Palliative are consulted. EEG no seizure.  CAD/CABG 1996/ischemic cardiomyopathy Not much pleural fluid effusion on review by IR thoracentesis canceled 7/25 Chest pain resolved.Seen by cardiology and had a stable mild troponin elevation no significant delta.-In the setting of infection likely.Aspirin held due to hematuria, resume statin.  T2DM: A1c 6.4, controlled on SSI Recent Labs  Lab 08/06/20 0814 08/06/20 1200 08/06/20 1639 08/06/20 2139 08/07/20 0747  GLUCAP 133* 135* 115* 131* 121*  QF:[id number] pressure Has been stable, osartan discontinued 2/2 AKI. Back on metoprolol 7/25> but discontinued due to AV block 7/27.  Mild hyponatremia stable  Chronic back pain:stable  Electrolyte imbalance with hypokalemia/hypophosphatemia, hypomagnesemia-resolved.  Dementia with behavioral disturbance-stable. He is interactive and appropriate.    Depression- mood is stable.  Goals of care: Does not want any heroic measures confirmed DNR Diet Order             DIET SOFT Room service appropriate? No; Fluid consistency: Thin  Diet effective now                  Patient's Body mass index is 25.31 kg/m. DVT prophylaxis: Place and maintain sequential compression device Start: 07/31/20 2314 Code Status:   Code Status: DNR  Family Communication: plan of care discussed with patient a and his wife at the  bedside. Status is: Inpatient Remains inpatient appropriate because:IV treatments appropriate due to intensity of illness or inability to take PO and Inpatient level of care appropriate due to severity of illness Dispo: The patient is from: Home              Anticipated d/c is to: Likely next 24 hours Home with home health              Patient currently is not medically stable to d/c.   Difficult to place patient No Unresulted Labs (From admission, onward)     Start     Ordered   08/06/20 [id number]  Basic metabolic panel  Daily,   R     Question:  Specimen collection method  Answer:  Lab=Lab collect   08/05/20 1030   08/06/20 0500  CBC  Daily,   R     Question:  Specimen collection method  Answer:  Lab=Lab collect   08/05/20 1030          Medications reviewed:  Scheduled Meds:  Chlorhexidine Gluconate Cloth  6 each Topical Daily   feeding supplement (NEPRO CARB STEADY)  237 mL Oral [id number]   folic acid  1 mg Oral Daily   insulin aspart  0-5 Units Subcutaneous QHS   insulin aspart  0-9 Units Subcutaneous TID WC   rosuvastatin  20 mg Oral QHS   tamsulosin  0.4 mg Oral QPC supper   Continuous Infusions:  sodium chloride irrigation     Consultants:see note  Procedures:see note Antimicrobials: Anti-infectives (From admission, onward)    Start     Dose/Rate Route Frequency Ordered Stop   08/04/20 1930  cefTRIAXone (ROCEPHIN) 2 g in sodium chloride 0.9 % 100 mL IVPB        2 g 200 mL/hr over 30 Minutes Intravenous  Once 08/04/20 1841 08/05/20 0802   07/26/20 1800  ceFEPIme (MAXIPIME) 1 g in sodium chloride 0.9 % 100 mL IVPB        1 g 200 mL/hr over 30 Minutes Intravenous Every 24 hours 07/26/20 1110 07/29/20 0954   07/24/20 1800  piperacillin-tazobactam (ZOSYN) IVPB 2.25 g  Status:  Discontinued        2.25 g 100 mL/hr over 30 Minutes Intravenous Every 8 hours 07/24/20 1026 07/26/20 1109   07/24/20 1115  piperacillin-tazobactam (ZOSYN) IVPB 3.375 g        3.375 g 100 mL/hr over  30 Minutes Intravenous  Once 07/24/20 1022 07/24/20 1304   07/23/20 2200  doxycycline (VIBRA-TABS) tablet 100 mg  Status:  Discontinued        100 mg Oral Every 12 hours 07/23/20 1043 07/24/20 1022   07/23/20 2045  cefTRIAXone (ROCEPHIN) 2 g in sodium chloride 0.9 % 100 mL IVPB  Status:  Discontinued        2 g 200 mL/hr over 30 Minutes Intravenous Every 24 hours 07/22/20 2116 07/24/20 1022   07/23/20 1806  metroNIDAZOLE (FLAGYL) IVPB 500 mg  Status:  Discontinued        500 mg 100 mL/hr over 60 Minutes Intravenous Every 8 hours 07/23/20 1724 07/24/20 1022   07/23/20 0845  doxycycline (VIBRAMYCIN) 100 mg in sodium chloride 0.9 % 250 mL IVPB  Status:  Discontinued        100 mg 125 mL/hr over 120 Minutes Intravenous Every 12 hours 07/22/20 2116 07/23/20 1043   07/22/20 2045  cefTRIAXone (ROCEPHIN) 1 g in sodium chloride 0.9 % 100 mL IVPB        1 g 200 mL/hr over 30 Minutes Intravenous  Once 07/22/20 2037 07/22/20 2319   07/22/20 2045  doxycycline (VIBRAMYCIN) 100 mg in sodium chloride 0.9 % 250 mL IVPB        100 mg 125 mL/hr over 120 Minutes Intravenous  Once 07/22/20 2037 07/23/20 0143      Culture/Microbiology    Component Value Date/Time   SDES URINE, CATHETERIZED 08/04/2020 1841   SPECREQUEST  08/04/2020 1841    NONE Performed at [facility], [address], [place], [place] [address]    CULT >=100,000 COLONIES/mL ENTEROCOCCUS FAECALIS (A) 08/04/2020 1841   REPTSTATUS 08/07/2020 FINAL 08/04/2020 1841    Other culture-see note  Objective: Vitals: Today's Vitals   08/07/20 0334 08/07/20 0340 08/07/20 0745 08/07/20 0825  BP: 134/62  (!) 134/58   Pulse: 79  81   Resp: 17  17   Temp: 98.1 F (36.7 C)  98 F (36.7 C)   TempSrc: Oral  Oral   SpO2: 95%  95%   Weight:  80 kg    Height:      PainSc:    0-No pain    Intake/Output Summary (Last 24 hours) at 08/07/2020 0958 Last data filed at 08/07/2020 0752 Gross per 24 hour  Intake 5480 ml  Output 4975 ml  Net  505 ml   Filed Weights   08/04/20 0421  08/06/20 0500 08/07/20 0340  Weight: 81.3 kg 81 kg 80 kg   Weight change: -1 kg  Intake/Output from previous day: 07/30 0701 - 07/31 0700 In: [id number] [P.O.:600; NG/GT:240] Out: 10775 [id number] Intake/Output this shift: Total I/O In: -  Out: 300 [Urine:300] Filed Weights   08/04/20 0421 08/06/20 0500 08/07/20 0340  Weight: 81.3 kg 81 kg 80 kg   Examination: General exam: AAO elderly frail appropriately interactive.   HEENT:Oral mucosa moist, Ear/Nose WNL grossly, dentition normal. Respiratory system: bilaterally diminished, no use of accessory muscle Cardiovascular system: S1 & S2 +, No JVD,. Gastrointestinal system: Abdomen soft,NT,ND, BS+ Nervous System:Alert, awake, moving extremities and grossly nonfocal Extremities: no edema, distal peripheral pulses palpable.  Skin: No rashes,no icterus. MSK: Normal muscle bulk,tone, power Foley catheter present urine clear  Data Reviewed: I have personally reviewed following labs and imaging studies CBC: Recent Labs  Lab 08/03/20 0041 08/03/20 1728 08/04/20 0117 08/04/20 1245 08/05/20 0035 08/06/20 0047 08/07/20 0028  WBC 10.1  --  9.9  --  23.5* 10.8* 10.3  HGB 7.2*   < > 7.8* 8.4* 9.7* 8.0* 8.1*  HCT 22.4*   < > 24.2* 25.7* 30.7* 24.6* 24.8*  MCV 106.7*  --  101.3*  --  102.7* 101.7* 100.8*  PLT 275  --  279  --  420* 281 298   < > = values in this interval not displayed.   Basic Metabolic Panel: Recent Labs  Lab 08/01/20 0153 08/02/20 0636 08/03/20 1728 08/04/20 0117 08/05/20 0035 08/05/20 1809 08/06/20 0047 08/06/20 1608 08/07/20 0028  NA 133*   < >  --    < > 134* 133* 134* 134* 135  K 4.7   < >  --    < > 5.7* 5.8* 5.4* 5.3* 4.9  CL 102   < >  --    < > 96* 99 101 101 101  CO2 23   < >  --    < > '22 26 25 25 24  '$ GLUCOSE 110*   < >  --    < > 246* 154* 100* 111* 124*  BUN 46*   < >  --    < > 56* 59* 59* 61* 62*  CREATININE 5.83*   < >  --    < > 5.55* 5.38* 5.37*  5.04* 4.95*  CALCIUM 7.9*   < >  --    < > 9.2 8.8* 8.5* 8.3* 8.5*  MG 1.9  --  2.0  --   --   --   --   --  1.7  PHOS 3.5  --   --   --   --   --   --   --   --    < > = values in this interval not displayed.   GFR: Estimated Creatinine Clearance: 11.5 mL/min (A) (by C-G formula based on SCr of 4.95 mg/dL (H)). Liver Function Tests: No results for input(s): AST, ALT, ALKPHOS, BILITOT, PROT, ALBUMIN in the last 168 hours.  No results for input(s): LIPASE, AMYLASE in the last 168 hours. No results for input(s): AMMONIA in the last 168 hours. Coagulation Profile: No results for input(s): INR, PROTIME in the last 168 hours. Cardiac Enzymes: No results for input(s): CKTOTAL, CKMB, CKMBINDEX, TROPONINI in the last 168 hours. BNP (last 3 results) No results for input(s): PROBNP in the last 8760 hours. HbA1C: No results for input(s): HGBA1C in the last 72 hours. CBG: Recent Labs  Lab 08/06/20  [id number] 08/06/20 1200 08/06/20 1639 08/06/20 2139 08/07/20 0747  GLUCAP 133* 135* 115* 131* 121*   Lipid Profile: No results for input(s): CHOL, HDL, LDLCALC, TRIG, CHOLHDL, LDLDIRECT in the last 72 hours. Thyroid Function Tests: No results for input(s): TSH, T4TOTAL, FREET4, T3FREE, THYROIDAB in the last 72 hours. Anemia Panel: No results for input(s): VITAMINB12, FOLATE, FERRITIN, TIBC, IRON, RETICCTPCT in the last 72 hours. Sepsis Labs: No results for input(s): PROCALCITON, LATICACIDVEN in the last 168 hours.   Recent Results (from the past 240 hour(s))  Urine Culture     Status: Abnormal   Collection Time: 08/04/20  6:41 PM   Specimen: Urine, Catheterized  Result Value Ref Range Status   Specimen Description URINE, CATHETERIZED  Final   Special Requests   Final    NONE Performed at [facility], [address] [place], [place] [address]    Culture >=100,000 COLONIES/mL ENTEROCOCCUS FAECALIS (A)  Final   Report Status 08/07/2020 FINAL  Final   Organism ID, Bacteria  ENTEROCOCCUS FAECALIS (A)  Final      Susceptibility   Enterococcus faecalis - MIC*    AMPICILLIN <=2 SENSITIVE Sensitive     NITROFURANTOIN <=16 SENSITIVE Sensitive     VANCOMYCIN 1 SENSITIVE Sensitive     * >=100,000 COLONIES/mL ENTEROCOCCUS FAECALIS     Radiology Studies: EEG adult  Result Date: September 04, 2020 Lora Havens, MD     09/04/2020 12:47 PM Patient Name: ELLIE DEMETRO MRN: [id number] Epilepsy Attending: Lora Havens Referring Physician/Provider: Dr Antonieta Pert Date: 09-04-2020 Duration: 23.40 mins Patient history: 87yo M with ams. EEG to evaluate for seizure Level of alertness: Awake, asleep AEDs during EEG study: None Technical aspects: This EEG study was done with scalp electrodes positioned according to the 10-20 International system of electrode placement. Electrical activity was acquired at a sampling rate of '500Hz'$  and reviewed with a high frequency filter of '70Hz'$  and a low frequency filter of '1Hz'$ . EEG data were recorded continuously and digitally stored. Description: The posterior dominant rhythm consists of 9 Hz activity of moderate voltage (25-35 uV) seen predominantly in posterior head regions, symmetric and reactive to eye opening and eye closing. Sleep was characterized by vertex waves, sleep spindles (12 to 14 Hz), maximal frontocentral region. EEG showed intermittent generalized 3 to 6 Hz theta-delta slowing. Hyperventilation and photic stimulation were not performed.   ABNORMALITY - Intermittent slow, generalized IMPRESSION: This study is suggestive of mild diffuse encephalopathy, nonspecific etiology. No seizures or epileptiform discharges were seen throughout the recording. Lora Havens     LOS: 16 days   Antonieta Pert, MD [facility]  08/07/2020, 9:58 AM

## 2020-08-07 NOTE — TOC Progression Note (Signed)
Transition of Care [facility]) - Progression Note    Patient Details  Name: Philip Kerr MRN: [id number] Date of Birth: 05-27-1933  [facility]) CM/SW Contact  Pollie Friar, RN Phone Number: 08/07/2020, 2:04 PM  Clinical Narrative:    CM spoke to patients wife over the phone. She is agreeable to [facility] services and [place] arranged. Information on the AVS. Will need walker delivered to the room prior to d/c. TOC following.   Expected Discharge Plan: [place] Barriers to Discharge: Continued Medical Work up  Expected Discharge Plan and Services Expected Discharge Plan: [place] arrangements for the past 2 months: Single Family Home                                       Social Determinants of Health (SDOH) Interventions    Readmission Risk Interventions No flowsheet data found.

## 2020-08-07 NOTE — Consult Note (Addendum)
Palliative Medicine Inpatient Consult Note  Consulting Provider: Antonieta Pert, MD  Reason for consult:   Philip Kerr Palliative Medicine Consult  Reason for Consult? GOC   HPI:  Per intake H&P --> 87 year old male with CAD, CABG 1996/ischemic cardiomyopathy, chronic systolic CHF EF 82%, U2PN, HTN presented with chest pain, nausea on had chronic cough that worsened past 3 to 4 days prior to admission and developed chest pain while at rest.  In the ED minimal and stable elevated troponin without EKG changes so was sent home from ED but immediately returned due to worsening chest pain weakness and cough and work-up found transaminitis, possible bacterial pneumonia, COVID-19 positive.  Hospital course complicated by severe delirium that has now resolved, acute urine retention with Foley inserted by urology due to difficulty and worsening renal failure, was seen by nephrology and they have signed off with outpatient follow-up recommendation. Patient started having hematuria from the penile Foley heparin and aspirin were discontinued-urology has been following. Taken off covid isolation 08/02/20. Due to ongoing hematuria CBI was placed hematuria resolved CBI clamped 7/29.  Urology is planning for outpatient voiding trial.  Palliative care was asked to get involved to aid in further goals of care conversations.  Clinical Assessment/Goals of Care:  *Please note that this is a verbal dictation therefore any spelling or grammatical errors are due to the "[place] One" system interpretation.  I have reviewed medical records including EPIC notes, labs and imaging, received report from bedside RN, assessed the patient who was sitting in bed with his wife, Philip Kerr present at bedside.   I met with Philip Kerr and Philip Kerr to further discuss diagnosis prognosis, GOC, EOL wishes, disposition and options.  A brief review of Philip Kerr's comorbid conditions was completed inclusive of his  history of coronary artery disease, congestive heart failure, type 2 diabetes, and hypertension.  Reviewed that Philip Kerr has been here for 15 days and his primary issue presently is his ongoing bloody urine.  Urology is working closely with Philip Kerr to pursue a voiding trial. Reviewed patients poor kidney numbers and how these are worrisome in the bigger picture.   I introduced Palliative Medicine as specialized medical care for people living with serious illness. It focuses on providing relief from the symptoms and stress of a serious illness. The goal is to improve quality of life for both the patient and the family.  Philip Kerr is from [place], [place].  He and his spouse, Philip Kerr have been in a relationship for the past 45 years and married for 61 years.  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  He is a man who loves animals and had pries beagles who he used to travel with.  He also had courses he who he raised and sold.  He is a man of faith and practices within this Philip Kerr he.  He attends [employer] in [place].  Prior to admission Philip Kerr and his wife both attest to the fact that he had been declining for the past few months.  She shares that he is stable able to get around without a cane or walker though he staggers "quite a bit." Reviewed the importance of continued mobility and the potential need for a cane/walker.   A detailed discussion was had today regarding advanced directives though we do not have any one file, Philip Kerr defers to his spouse, Philip Kerr for "all decisions".    Concepts specific to code status, artifical feeding and hydration, continued IV antibiotics and  rehospitalization was had.  A MOST form was introduced and completed as below:   Cardiopulmonary Resuscitation: Do Not Attempt Resuscitation (DNR/No CPR)  Medical Interventions: Limited Additional Interventions: Use medical treatment, IV fluids and cardiac monitoring as indicated, DO NOT USE intubation or  mechanical ventilation. May consider use of less invasive airway support such as BiPAP or CPAP. Also provide comfort measures. Transfer to the hospital if indicated. Avoid intensive care.   Antibiotics: Determine use of limitation of antibiotics when infection occurs  IV Fluids: IV fluids for a defined trial period  Feeding Tube: No feeding tube   The difference between a aggressive medical intervention path  and a palliative comfort care path for this patient at this time was had. Philip Kerr shares that if his time comes, he is at peace with it. He expresses that he has no fears of dying. I introduced the topic of hospice though Philip Kerr shares that her mother was on hospice and was "doped up". She expresses not wanting to have hospice involvement ever after this experience.   Discussed the importance of continued conversation with family and their  medical providers regarding overall plan of care and treatment options, ensuring decisions are within the context of the patients values and GOCs.  Decision Maker: Philip Kerr (spouse) [phone]  SUMMARY OF RECOMMENDATIONS   DNAR/DNI  MOST Completed, paper copy placed onto the chart electric copy can be found in [facility]  DNR Form Completed, paper copy placed onto the chart electric copy can be found in [facility]  Generalized Weakness: PT/OT pending  Ongoing support provided by the PMT - Goals are clear for medical optimization though if patient were to decline he would never wish for heroics to sustain life. Patients wife does not believe in hospice care and declined consideration of this presently.  Code Status/Advance Care Planning: DNAR/DNI   Palliative Prophylaxis:  Oral Care, Mobility, Delirium precuations  Additional Recommendations (Limitations, Scope, Preferences): Continue to treat what is treatable   Psycho-social/Spiritual:  Desire for further Chaplaincy support: No Additional Recommendations: Education on chronic disease    Prognosis: Will depend up Cr level of improvement  Discharge Planning: Unclear, PT/OT evaluations need to be complete  Vitals:   08/07/20 0745 08/07/20 1144  BP: (!) 134/58 (!) 129/56  Pulse: 81 81  Resp: 17 17  Temp: 98 F (36.7 C) 98.2 F (36.8 C)  SpO2: 95% 98%    Intake/Output Summary (Last 24 hours) at 08/07/2020 1345 Last data filed at 08/07/2020 1018 Gross per 24 hour  Intake 5720 ml  Output 5350 ml  Net 370 ml   Last Weight  Most recent update: 08/07/2020  3:40 AM    Weight  80 kg (176 lb 5.9 oz)            Gen:  Elderly M in NAD HEENT: moist mucous membranes CV: Regular rate and rhythm  PULM: On RA ABD: soft/nontender  EXT: No edema  Neuro: Alert and oriented x3   PPS: 40-50%   This conversation/these recommendations were discussed with patient primary care team, Dr. Lupita Leash  Time In: 1330 Time Out: 1440 Total Time: 70 Greater than 50%  of this time was spent counseling and coordinating care related to the above assessment and plan.  [place] Team Team Cell Phone: [phone] Please utilize secure chat with additional questions, if there is no response within 30 minutes please call the above phone number  Palliative Medicine Team providers are available by phone from [place]  to 7pm daily and can be reached through the team cell phone.  Should this patient require assistance outside of these hours, please call the patient's attending physician.

## 2020-08-07 NOTE — Progress Notes (Signed)
Telemetry notified this RN of a 1.4 second pause. Patient resting comfortably in bed watching TV with wife. Dr.Hall informed.

## 2020-08-08 DIAGNOSIS — I472 Ventricular tachycardia: Secondary | ICD-10-CM | POA: Diagnosis not present

## 2020-08-08 DIAGNOSIS — N179 Acute kidney failure, unspecified: Secondary | ICD-10-CM | POA: Diagnosis not present

## 2020-08-08 LAB — CBC
HCT: 25.2 % — ABNORMAL LOW (ref 39.0–52.0)
Hemoglobin: 8.3 g/dL — ABNORMAL LOW (ref 13.0–17.0)
MCH: 33.3 pg (ref 26.0–34.0)
MCHC: 32.9 g/dL (ref 30.0–36.0)
MCV: 101.2 fL — ABNORMAL HIGH (ref 80.0–100.0)
Platelets: 293 10*3/uL (ref 150–400)
RBC: 2.49 MIL/uL — ABNORMAL LOW (ref 4.22–5.81)
RDW: 17.3 % — ABNORMAL HIGH (ref 11.5–15.5)
WBC: 9.1 10*3/uL (ref 4.0–10.5)
nRBC: 0 % (ref 0.0–0.2)

## 2020-08-08 LAB — BASIC METABOLIC PANEL
Anion gap: 7 (ref 5–15)
BUN: 63 mg/dL — ABNORMAL HIGH (ref 8–23)
CO2: 24 mmol/L (ref 22–32)
Calcium: 8.5 mg/dL — ABNORMAL LOW (ref 8.9–10.3)
Chloride: 104 mmol/L (ref 98–111)
Creatinine, Ser: 4.45 mg/dL — ABNORMAL HIGH (ref 0.61–1.24)
GFR, Estimated: 12 mL/min — ABNORMAL LOW (ref >60)
Glucose, Bld: 148 mg/dL — ABNORMAL HIGH (ref 70–99)
Potassium: 5 mmol/L (ref 3.5–5.1)
Sodium: 135 mmol/L (ref 135–145)

## 2020-08-08 LAB — GLUCOSE, CAPILLARY
Glucose-Capillary: 139 mg/dL — ABNORMAL HIGH (ref 70–99)
Glucose-Capillary: 156 mg/dL — ABNORMAL HIGH (ref 70–99)

## 2020-08-08 MED ORDER — OXYCODONE HCL 5 MG PO TABS: 5.0000 mg | ORAL_TABLET | Freq: Four times a day (QID) | ORAL | 0 refills | Status: DC | PRN

## 2020-08-08 MED ORDER — TAMSULOSIN HCL 0.4 MG PO CAPS: 0.4000 mg | ORAL_CAPSULE | Freq: Every day | ORAL | 0 refills | Status: DC

## 2020-08-08 MED ORDER — OXYBUTYNIN CHLORIDE 5 MG PO TABS: 5.0000 mg | ORAL_TABLET | Freq: Three times a day (TID) | ORAL | 0 refills | Status: DC | PRN

## 2020-08-08 MED ORDER — AMIODARONE HCL 400 MG PO TABS: ORAL_TABLET | ORAL | 0 refills | Status: DC

## 2020-08-08 MED ORDER — TRAZODONE HCL 100 MG PO TABS: 100.0000 mg | ORAL_TABLET | Freq: Every evening | ORAL | 0 refills | Status: DC | PRN

## 2020-08-08 MED ORDER — LOKELMA 5 G PO PACK: 5.0000 g | PACK | Freq: Every day | ORAL | 0 refills | Status: AC

## 2020-08-08 NOTE — TOC Transition Note (Addendum)
Transition of Care [facility]) - CM/SW Discharge Note   Patient Details  Name: YVAN JUHNKE MRN: [id number] Date of Birth: 04-13-1933  Transition of Care [facility]) CM/SW Contact:  Bartholomew Crews, RN Phone Number: [phone] 08/08/2020, 1:22 PM   Clinical Narrative:     Spoke with patient's wife and son on the phone to discuss transition home. Family at bedside at this time and will provide transport home. Advised that [employer] will deliver RW to the room. [facility] notified of transition home today. HH orders in place per MD. No further TOC needs identified at this time.   1513: Notified by OT of patient needing 3N1 and tub bench. Spoke with son, Jaalen, on his cell phone. Confirmed agreeable for 3N1. Discussed that tub bench is an out of pocket cost not covered by Medicare - family to research for best product. Referral to [employer] for delivery of 3N1 to the room.   Final next level of care: Home w Home Health Services Barriers to Discharge: No Barriers Identified   Patient Goals and CMS Choice Patient states their goals for this hospitalization and ongoing recovery are:: return home with wife CMS Medicare.gov Compare Post Acute Care list provided to:: Patient Choice offered to / list presented to : Spouse, Adult Children  Discharge Placement                       Discharge Plan and Services                DME Arranged: [place] rolling DME Agency: [employer] Date DME Agency Contacted: 08/08/20 Time DME Agency Contacted: [id number] Representative spoke with at DME Agency: Freda Munro HH Arranged: PT, OT [facility] Agency: [place] Date [place]: 08/08/20 Time [place]: 1322 Representative spoke with at [place]: [place] ([place]) Interventions     Readmission Risk Interventions No flowsheet data found.

## 2020-08-08 NOTE — Discharge Summary (Signed)
Physician Discharge Summary  Philip Kerr [id number] DOB: August 21, 1933 DOA: 07/22/2020  PCP: Biagio Borg, MD  Admit date: 07/22/2020 Discharge date: 08/08/2020  Admitted From: home Disposition:  [place]  Recommendations for Outpatient Follow-up:  Follow up with PCP in 1-2 weeks Please obtain BMP/ in 3-4 days Please follow up on the following pending results:  Home Health:yes  Equipment/Devices:no  Discharge Condition: Stable Code Status:   Code Status: DNR Diet recommendation:  Diet Order             Diet - low sodium heart healthy           Diet renal with fluid restriction Fluid restriction: 1200 mL Fluid; Room service appropriate? Yes; Fluid consistency: Thin  Diet effective now                  Brief/Interim Summary: 87 year old male with CAD, CABG 1996/ischemic cardiomyopathy, chronic systolic CHF EF 123456, 123456, HTN presented with chest pain, nausea on had chronic cough that worsened past 3 to 4 days prior to admission and developed chest pain while at rest.  In the ED minimal and stable elevated troponin without EKG changes so was sent home from ED but immediately returned due to worsening chest pain weakness and cough and work-up found transaminitis, possible bacterial pneumonia, COVID-19 positive.  Hospital course complicated by severe delirium that has now resolved, acute urine retention with Foley inserted by urology due to difficulty and worsening renal failure, was seen by nephrology and they have signed off with outpatient follow-up recommendation. Patient started having hematuria from the penile Foley heparin and aspirin were discontinued-urology has been following. Taken off covid isolation 08/02/20. Due to ongoing hematuria CBI was placed hematuria resolved CBI clamped 7/29.  Urology is planning for outpatient voiding trial. Patient had nonsustained V. tach episodes placed on beta-blocker but again had some AV block/pause issues and beta-blocker was discontinued.  He  had recurrent intermittent episodes of asymptomatic V. tach cardiology back on board and placed on amiodarone.  Seen by palliative care MOST form completed. Urology advised to keep the three-way Foley catheter , capping the third port-[facility] will follow up outpatient in 1 to 2-week for voiding trial.  Discharge Diagnoses:   Left lower lobe bacterial pneumonia COVID-19 positive procalcitonin peaked at 20 > downtrended to 5.3, consistent with bacterial pneumonia in the setting of COVID infection.  Completed antibiotics.respiratory status stable .he is on room air encourage PT OT today.  off airborne precaution. Recent Labs  Lab 08/04/20 0117 08/05/20 0035 08/06/20 0047 08/07/20 0028 08/08/20 0040  WBC 9.9 23.5* 10.8* 10.3 9.1   Nonoliguric AKI in the setting of CKD stage IIIa Baseline creatinine 1.5 peaked to 5.9 > seen by nephrology suspected ATN, ultrasound showed bilateral renal cyst.  Creatinine running in the 5 range today down to 4.4, having urine output, continue the Foley catheter with outpatient voiding trial. Monitor BMP as outpatient this week- follow-up with nephrology. Recent Labs  Lab 08/05/20 1809 08/06/20 0047 08/06/20 1608 08/07/20 0028 08/08/20 0040  BUN 59* 59* 61* 62* 63*  CREATININE 5.38* 5.37* 5.04* 4.95* 4.45*   Hyperkalemia: S.P Lokelma multiple doses.  Potassium has been stable,  at 5.0- we will give lokelma daily x2 more days. BMP in 3-4 days Recent Labs  Lab 08/05/20 1809 08/06/20 0047 08/06/20 1608 08/07/20 0028 08/08/20 0040  K 5.8* 5.4* 5.3* 4.9 5.0  Gross hematuria from Foley catheter site Foley catheter in place for urine retention BPH w/ traumatic Foley  insertion Acute blood loss anemia due to hematuria Placed on CBI 7/28-CBI clamped 7/29 and now plan is for outpatient follow-up for voiding trial.  Await for further urology input.  Continue Foley catheter in place.  Monitor hemoglobin overall stable > 8 gm . continue to hold Asa,  heparin . Recent Labs  Lab 08/04/20 1245 08/05/20 0035 08/06/20 0047 08/07/20 0028 08/08/20 0040  HGB 8.4* 9.7* 8.0* 8.1* 8.3*  HCT 25.7* 30.7* 24.6* 24.8* 25.2*   Leukocytosis: Likely secondary to manipulation for CBI, no other clear source of infection.  Leukocytosis resolved and afebrile. Patient did receive ceftriaxone post manipulation by urology.  Acute on chronic systolic CHF EF 123456 , BNP elevated 4160-echo shows  7/24-EF 45 to 50% severe asymmetric left ventricular hypertrophy moderate MR monitor intake output .  CHF team input appreciated -not a candidate for ARB/ACE/ARN I due to AKI, BP too soft to add hydralazine/Imdur, Toprol-XL 12.5 mg was added but discontinued due to high-grade AV block.  Diuretics on hold due to AKI Net IO Since Admission: -15,535.46 mL [08/08/20 1304]   High-grade AV block with 8-second pause 7/27 WCT-asymptomatic/NSVT:Multiple beats of V.Tach.  Seen by cardiology again now placed on amiodarone continue: 400 mg bidx2 weeks then  400 mg daily for suppression.    CAD/CABG 1996/ischemic cardiomyopathy Not much pleural fluid effusion on review by IR thoracentesis canceled 7/25 Chest pain resolved.Seen by cardiology and had a stable mild troponin elevation no significant delta.-In the setting of infection likely.Aspirin held due to hematuria, resume statin.  T2DM: A1c 6.4, controlled on SSI Recent Labs  Lab 08/07/20 1148 08/07/20 1629 08/07/20 2031 08/08/20 0722 08/08/20 1141  GLUCAP 121* 114* 136* 139* 156*  EJ:[id number] pressure Has been stable, osartan discontinued 2/2 AKI. Back on metoprolol 7/25> but discontinued due to AV block 7/27.  Mild hyponatremia stable  Chronic back pain:stable  Electrolyte imbalance with hypokalemia/hypophosphatemia, hypomagnesemia-resolved.  Dementia with behavioral disturbance-stable. He is interactive and appropriate.    Depression- mood is stable.  Goals of care: Does not want any heroic measures confirmed  DNR  - GDMT: intolerant to BB, not candidate for ARNI/ACEI/ARB/MRA/SGLT2I due to AKI , may add Bidil if BP requires later - appt arranged with cardiology on 08/25/20 for re-evaluation  Consults: Cardiology, neurology, nephrology  Subjective: Alert awake oriented, he would like to go home today at any cost.  Wife at the bedside.  Discharge Exam: Vitals:   08/08/20 0723 08/08/20 1141  BP: 132/63 (!) 130/56  Pulse: 83 73  Resp: 15 17  Temp: 97.9 F (36.6 C) 98 F (36.7 C)  SpO2: 96% 96%   General: Pt is alert, awake, not in acute distress Cardiovascular: RRR, S1/S2 +, no rubs, no gallops Respiratory: CTA bilaterally, no wheezing, no rhonchi Abdominal: Soft, NT, ND, bowel sounds + Extremities: no edema, no cyanosis  Discharge Instructions  Discharge Instructions     Diet - low sodium heart healthy   Complete by: As directed    Discharge instructions   Complete by: As directed    Check BMP in 3 to 4 days to monitor potassium level and renal function.  Please call [facility] for follow-up  Continue on low potassium diet  [facility] for voiding trial with urology in 1 to 2 weeks, if you do not hear from the office in the next 3 to 4 days please call the number provided.  Please call call MD or return to ER for similar or worsening recurring problem that  brought you to hospital or if any fever,nausea/vomiting,abdominal pain, uncontrolled pain, chest pain,  shortness of breath or any other alarming symptoms.  Please follow-up your doctor as instructed in a week time and call the office for appointment.  Please avoid alcohol, smoking, or any other illicit substance and maintain healthy habits including taking your regular medications as prescribed.  You were cared for by a hospitalist during your hospital stay. If you have any questions about your discharge medications or the care you received while you were in the hospital after you are  discharged, you can call the unit and ask to speak with the hospitalist on call if the hospitalist that took care of you is not available.  Once you are discharged, your primary care physician will handle any further medical issues. Please note that NO REFILLS for any discharge medications will be authorized once you are discharged, as it is imperative that you return to your primary care physician (or establish a relationship with a primary care physician if you do not have one) for your aftercare needs so that they can reassess your need for medications and monitor your lab values   Increase activity slowly   Complete by: As directed       Allergies as of 08/08/2020   No Known Allergies      Medication List     STOP taking these medications    amLODipine 5 MG tablet Commonly known as: NORVASC   citalopram 10 MG tablet Commonly known as: CeleXA   doxazosin 2 MG tablet Commonly known as: CARDURA   furosemide 20 MG tablet Commonly known as: LASIX   losartan 50 MG tablet Commonly known as: COZAAR   metoprolol tartrate 50 MG tablet Commonly known as: LOPRESSOR   pramipexole 0.25 MG tablet Commonly known as: Mirapex   QUEtiapine 50 MG tablet Commonly known as: SEROquel   VITAMIN C PO       TAKE these medications    albuterol 108 (90 Base) MCG/ACT inhaler Commonly known as: VENTOLIN HFA Inhale 2 puffs into the lungs every 6 (six) hours as needed for wheezing or shortness of breath.   amiodarone 400 MG tablet Commonly known as: PACERONE Take 400 mg bid x 13 days then 200 mg daily   aspirin EC 81 MG tablet Take 81 mg by mouth every morning. Swallow whole.   Lokelma 5 g packet Generic drug: sodium zirconium cyclosilicate Take 5 g by mouth daily for 3 days.   oxybutynin 5 MG tablet Commonly known as: DITROPAN Take 1 tablet (5 mg total) by mouth every 8 (eight) hours as needed for up to 14 days for bladder spasms.   oxyCODONE 5 MG immediate release  tablet Commonly known as: Oxy IR/ROXICODONE Take 1 tablet (5 mg total) by mouth every 6 (six) hours as needed for up to 12 doses for breakthrough pain (hold for sedation).   rosuvastatin 20 MG tablet Commonly known as: CRESTOR Take 1 tablet (20 mg total) by mouth daily. What changed: when to take this   tamsulosin 0.4 MG Caps capsule Commonly known as: FLOMAX TAKE 1 CAPSULE BY MOUTH EVERY DAY   tamsulosin 0.4 MG Caps capsule Commonly known as: FLOMAX Take 1 capsule (0.4 mg total) by mouth daily after supper.   traZODone 100 MG tablet Commonly known as: DESYREL Take 1 tablet (100 mg total) by mouth at bedtime as needed for up to 7 days for sleep. What changed:  medication strength how much to take reasons to  take this additional instructions   VITAMIN D3 PO Take 1 capsule by mouth at bedtime.               Durable Medical Equipment  (From admission, onward)           Start     Ordered   08/04/20 1635  For home use only DME Walker rolling  Once       Question Answer Comment  Walker: With [place] Wheels   Patient needs a walker to treat with the following condition Weakness      08/04/20 1638            Follow-up Information     Isaiah Serge, NP Follow up.   Specialties: Cardiology, Radiology Why: [facility] - cardiology follow-up on Thursday Aug 25, 2020 9:15 AM (Arrive by 9:00 AM). Mickel Baas is one of the nurse practitioners with our team. Contact information: [address] [place] [address] [phone]         [facility] Follow up.   Specialty: Home Health Services Why: The home health agency will call you for the first home visit. Contact information: [place] [address] [phone]         [facility] Follow up in 1 week(s).   Why: call office for voiding trial if you do not hear from them in 2-3 days Contact information: [address] [place]        Biagio Borg, MD. Call.   Specialties: Internal Medicine, Radiology Why: you will need BMp check in 3-4 days Contact information: [place] [address] [phone]         Burnell Blanks, MD .   Specialty: Cardiology Contact information: [place] [place] [address] [phone]         [facility] Call.   Specialty: Nephrology Contact information: [address] [place] [address] [phone]                No Known Allergies  The results of significant diagnostics from this hospitalization (including imaging, microbiology, ancillary and laboratory) are listed below for reference.    Microbiology: Recent Results (from the past 240 hour(s))  Urine Culture     Status: Abnormal   Collection Time: 08/04/20  6:41 PM   Specimen: Urine, Catheterized  Result Value Ref Range Status   Specimen Description URINE, CATHETERIZED  Final   Special Requests   Final    NONE Performed at [facility], [address] [place], [place] [address]    Culture >=100,000 COLONIES/mL ENTEROCOCCUS FAECALIS (A)  Final   Report Status 08/07/2020 FINAL  Final   Organism ID, Bacteria ENTEROCOCCUS FAECALIS (A)  Final      Susceptibility   Enterococcus faecalis - MIC*    AMPICILLIN <=2 SENSITIVE Sensitive     NITROFURANTOIN <=16 SENSITIVE Sensitive     VANCOMYCIN 1 SENSITIVE Sensitive     * >=100,000 COLONIES/mL ENTEROCOCCUS FAECALIS    Procedures/Studies: CT ABDOMEN PELVIS WO CONTRAST  Result Date: 07/24/2020 CLINICAL DATA:  Abdominal pain EXAM: CT ABDOMEN AND PELVIS WITHOUT CONTRAST TECHNIQUE: Multidetector CT imaging of the abdomen and pelvis was performed following the standard protocol without IV contrast. COMPARISON:  None. FINDINGS: Lower chest: Previous CABG. Trace right pleural effusion. Coarse parenchymal and airspace  opacities at  the left lung base. 1 cm nodular opacity in the posterior right lower lobe (Im16,Se5) . Hepatobiliary: No focal liver abnormality is seen. No gallstones, gallbladder wall thickening, or biliary dilatation. Pancreas: Unremarkable. No pancreatic ductal dilatation or surrounding inflammatory changes. Spleen: Normal in size without focal abnormality. Adrenals/Urinary Tract: Adrenal glands unremarkable. Bilateral partially exophytic renal lesions possibly cysts but incompletely characterized, largest 2.9 cm from the right lower pole. No renal calculus or hydronephrosis. Innumerable partially calcified stones measuring up to 1.1 cm layer in the nondistended urinary bladder. There is 1.9 cm right lateral diverticulum containing stones. There is bladder wall thickening. Foley catheter has its balloon within the prostate gland. Stomach/Bowel: Stomach is nondistended. The small bowel is decompressed. Colon is nondistended with scattered diverticula most numerous in the sigmoid 7 segment, without adjacent inflammatory change. The descending/sigmoid junction protrudes into left inguinal hernia, without obstruction or strangulation. Vascular/Lymphatic: Extensive calcified aortoiliac plaque without aneurysm. No abdominal or pelvic adenopathy. Reproductive: Prostate enlargement. The Foley catheter balloon is positioned centrally within the prostate. Other: Left pelvic phleboliths.  No ascites.  No free air. Musculoskeletal: Left inguinal hernia, involving the descending/sigmoid junction of the colon without obstruction or strangulation. Right inguinal hernia containing mostly fat, possible nondistended testicle. Sternotomy wires. No fracture or worrisome bone lesion. IMPRESSION: 1. Colonic involvement in left inguinal hernia, without obstruction or strangulation. 2. Foley catheter balloon malpositioned centrally within the prostate gland. 3. Innumerable bladder calculi. 4. Colonic diverticulosis 5. Coarse left lower  lobe airspace opacities of uncertain chronicity. Recommend comparison to any previous available outside studies. 6.  Aortic Atherosclerosis (ICD10-170.0). Electronically Signed   By: Lucrezia Europe M.D.   On: 07/24/2020 14:15   DG Chest 2 View  Result Date: 07/22/2020 CLINICAL DATA:  87 year old male with chest pain. EXAM: CHEST - 2 VIEW COMPARISON:  Chest radiographs 09/06/2017. FINDINGS: AP and lateral views today. Chronic surgical clips and volume loss in the left hemithorax. Chronic pleural blunting or scarring at the costophrenic angle. Superimposed prior CABG. Mediastinal contours remain within normal limits. No pneumothorax, pulmonary edema, acute pleural effusion or pulmonary opacity. Overall lower lung volumes compared to 2019. Visualized tracheal air column is within normal limits. No acute osseous abnormality identified. Abdomen Calcified aortic atherosclerosis. Negative visible bowel gas pattern. IMPRESSION: 1. Lower lung volumes but otherwise stable chest since 2019 with evidence of prior partial left pneumonectomy. 2.  Aortic Atherosclerosis (ICD10-I70.0). Electronically Signed   By: Genevie Ann M.D.   On: 07/22/2020 06:43   US PELVIS LIMITED (TRANSABDOMINAL ONLY)  Result Date: 08/04/2020 CLINICAL DATA:  Hematuria. EXAM: LIMITED ULTRASOUND OF PELVIS TECHNIQUE: Limited transabdominal ultrasound examination of the pelvis was performed. COMPARISON:  None. FINDINGS: Foley catheter is noted within urinary bladder. Prevoid volume was 76 mL. Post Foley drainage, urine volume was measured at 5 mm. Wall thickening of the urinary bladder is noted, but this may be due to lack of distension. IMPRESSION: Foley catheter is noted in the urinary bladder. Wall thickening is noted, but this may be due to the lack of distension. Electronically Signed   By: Marijo Conception M.D.   On: 08/04/2020 17:25   US RENAL  Result Date: 07/24/2020 CLINICAL DATA:  COVID-19 positive.  Acute renal insufficiency. EXAM: RENAL /  URINARY TRACT ULTRASOUND COMPLETE COMPARISON:  None. FINDINGS: Right Kidney: Renal measurements: 12.1 x 6.6 x 6.9 cm = volume: 288 mL. Multiple cysts, less than 10, identified throughout the right kidney. The largest inferiorly measures 2.3 x 2.1 x 2.6 cm. Left  Kidney: Renal measurements: 14.1 x 7.6 x 7.0 cm = volume: 391 mL. Several cysts, less than 10, identified throughout the left kidney. The largest superiorly measures 2.0 x 2.3 x 2.4 cm. Bladder: Decompressed with a Foley catheter. Other: None. IMPRESSION: 1. Bilateral renal cysts.  No other significant abnormalities. Electronically Signed   By: Dorise Bullion III M.D   On: 07/24/2020 12:45   DG CHEST PORT 1 VIEW  Result Date: 07/31/2020 CLINICAL DATA:  Acute CHF EXAM: PORTABLE CHEST 1 VIEW COMPARISON:  July 24, 2020 FINDINGS: The cardiomediastinal silhouette is unchanged and enlarged in contour.Status post median sternotomy and CABG. Multiple clips project over the LEFT chest. Incomplete visualization of the RIGHT costophrenic angle. Unchanged LEFT-sided pleural blunting. No pneumothorax. Persistent and increased heterogeneous opacities of the LEFT retrocardiac region. No new opacities in the RIGHT lung. Visualized abdomen is unremarkable. Remote LEFT-sided rib fracture. IMPRESSION: 1. Mild increase in heterogeneous opacities in the LEFT lung base. Differential considerations include infection or atelectasis. Electronically Signed   By: Valentino Saxon MD   On: 07/31/2020 11:58   DG Chest Port 1 View  Result Date: 07/24/2020 CLINICAL DATA:  Severe shortness of breath.  COVID positive EXAM: PORTABLE CHEST 1 VIEW COMPARISON:  Radiograph 07/22/2020 FINDINGS: Sternal wires over enlarged cardiac silhouette. There is LEFT lower lobe atelectasis and small effusion. RIGHT lung clear. IMPRESSION: 1. No interval change. 2. LEFT lower lobe atelectasis and effusion. Electronically Signed   By: Suzy Bouchard M.D.   On: 07/24/2020 11:45   DG Chest Port 1  View  Result Date: 07/22/2020 CLINICAL DATA:  Left-sided chest pain, COVID-19 positive EXAM: PORTABLE CHEST 1 VIEW COMPARISON:  07/22/2020 at 6:20 a.m. FINDINGS: Single frontal view of the chest demonstrates postsurgical changes from CABG. The cardiac silhouette is stable. Since the previous exam, there is increased consolidation at the left lung base. Chronic postsurgical changes within the left hemithorax, with stable left pleural thickening. Right chest is clear. No pneumothorax. IMPRESSION: 1. Increasing consolidation at the left lung base concerning for pneumonia. Electronically Signed   By: Randa Ngo M.D.   On: 07/22/2020 20:11   EEG adult  Result Date: 08/05/2020 Lora Havens, MD     08/05/2020 12:47 PM Patient Name: Philip Kerr MRN: [id number] Epilepsy Attending: Lora Havens Referring Physician/Provider: Dr Antonieta Pert Date: 08/05/2020 Duration: 23.40 mins Patient history: 87yo M with ams. EEG to evaluate for seizure Level of alertness: Awake, asleep AEDs during EEG study: None Technical aspects: This EEG study was done with scalp electrodes positioned according to the 10-20 International system of electrode placement. Electrical activity was acquired at a sampling rate of '500Hz'$  and reviewed with a high frequency filter of '70Hz'$  and a low frequency filter of '1Hz'$ . EEG data were recorded continuously and digitally stored. Description: The posterior dominant rhythm consists of 9 Hz activity of moderate voltage (25-35 uV) seen predominantly in posterior head regions, symmetric and reactive to eye opening and eye closing. Sleep was characterized by vertex waves, sleep spindles (12 to 14 Hz), maximal frontocentral region. EEG showed intermittent generalized 3 to 6 Hz theta-delta slowing. Hyperventilation and photic stimulation were not performed.   ABNORMALITY - Intermittent slow, generalized IMPRESSION: This study is suggestive of mild diffuse encephalopathy, nonspecific etiology. No seizures  or epileptiform discharges were seen throughout the recording. Lora Havens   ECHOCARDIOGRAM COMPLETE  Result Date: 07/31/2020    ECHOCARDIOGRAM LIMITED REPORT   Patient Name:   Philip Kerr Date of Exam:  07/31/2020 Medical Rec #:  [id number]        Height:       70.0 in Accession #:    [id number]       Weight:       182.1 lb Date of Birth:  [birth year]        BSA:          2.006 m Patient Age:    28 years         BP:           125/83 mmHg Patient Gender: M                HR:           91 bpm. Exam Location:  Inpatient Procedure: 2D Echo, Cardiac Doppler and Color Doppler Indications:    CHF-Acute Systolic  History:        Patient has prior history of Echocardiogram examinations, most                 recent 09/11/2017. CAD, Prior CABG; Risk Factors:Hypertension and                 Diabetes. Ischemic cardiomyopathy. HFpEF. COVID-19.  Sonographer:    Clayton Lefort RDCS (AE) Referring Phys: [id number] [facility]  Sonographer Comments: No subcostal window. COVID-19 IMPRESSIONS  1. Left ventricular ejection fraction, by estimation, is 45 to 50%. The left ventricle has mildly decreased function. Left ventricular endocardial border not optimally defined to evaluate regional wall motion. There is severe asymmetric left ventricular  hypertrophy of the septal segment. Left ventricular diastolic parameters are indeterminate.  2. Right ventricular systolic function is normal. The right ventricular size is normal. Tricuspid regurgitation signal is inadequate for assessing PA pressure.  3. Left atrial size was mildly dilated.  4. The mitral valve is abnormal. Moderate mitral valve regurgitation. No evidence of mitral stenosis.  5. Tricuspid valve regurgitation is mild to moderate.  6. The aortic valve is tricuspid. There is mild calcification of the aortic valve. There is mild thickening of the aortic valve.  7. Aortic dilatation noted. There is mild dilatation of the aortic root, measuring 40 mm. FINDINGS  Left Ventricle:  Left ventricular ejection fraction, by estimation, is 45 to 50%. The left ventricle has mildly decreased function. Left ventricular endocardial border not optimally defined to evaluate regional wall motion. The left ventricular internal cavity size was normal in size. There is severe asymmetric left ventricular hypertrophy of the septal segment. Left ventricular diastolic parameters are indeterminate. Right Ventricle: The right ventricular size is normal. No increase in right ventricular wall thickness. Right ventricular systolic function is normal. Tricuspid regurgitation signal is inadequate for assessing PA pressure. Left Atrium: Left atrial size was mildly dilated. Right Atrium: Right atrial size was normal in size. Mitral Valve: The mitral valve is abnormal. There is mild thickening of the mitral valve leaflet(s). There is mild calcification of the mitral valve leaflet(s). Mild mitral annular calcification. Moderate mitral valve regurgitation. No evidence of mitral  valve stenosis. Tricuspid Valve: The tricuspid valve is not well visualized. Tricuspid valve regurgitation is mild to moderate. No evidence of tricuspid stenosis. Aortic Valve: The aortic valve is tricuspid. There is mild calcification of the aortic valve. There is mild thickening of the aortic valve. There is mild aortic valve annular calcification. Aortic regurgitation PHT measures 499 msec. Aortic valve mean gradient measures 5.0 mmHg. Aortic valve peak gradient measures 8.8 mmHg. Aortic valve area, by VTI measures 3.39 cm.  Pulmonic Valve: The pulmonic valve was not well visualized. Pulmonic valve regurgitation is mild. No evidence of pulmonic stenosis. Aorta: The aortic root is normal in size and structure and aortic dilatation noted. There is mild dilatation of the aortic root, measuring 40 mm. Venous: The inferior vena cava was not well visualized. LEFT VENTRICLE PLAX 2D LVIDd:         5.80 cm LVIDs:         5.20 cm LV PW:         1.20 cm LV  IVS:        1.70 cm LVOT diam:     2.40 cm LV SV:         89 LV SV Index:   45 LVOT Area:     4.52 cm  RIGHT VENTRICLE RV Basal diam:  3.30 cm RV S prime:     12.00 cm/s TAPSE (M-mode): 1.3 cm LEFT ATRIUM           Index       RIGHT ATRIUM           Index LA diam:      4.80 cm 2.39 cm/m  RA Area:     14.90 cm LA Vol (A4C): 70.5 ml 35.15 ml/m RA Volume:   35.30 ml  17.60 ml/m  AORTIC VALVE AV Area (Vmax):    3.52 cm AV Area (Vmean):   3.08 cm AV Area (VTI):     3.39 cm AV Vmax:           148.01 cm/s AV Vmean:          103.139 cm/s AV VTI:            0.264 m AV Peak Grad:      8.8 mmHg AV Mean Grad:      5.0 mmHg LVOT Vmax:         115.02 cm/s LVOT Vmean:        70.152 cm/s LVOT VTI:          0.197 m LVOT/AV VTI ratio: 0.75 AI PHT:            499 msec  AORTA Ao Root diam: 3.60 cm Ao Asc diam:  4.00 cm MR Peak grad: 86.9 mmHg MR Mean grad: 47.0 mmHg   SHUNTS MR Vmax:      466.00 cm/s Systemic VTI:  0.20 m MR Vmean:     316.0 cm/s  Systemic Diam: 2.40 cm Carlyle Dolly MD Electronically signed by Carlyle Dolly MD Signature Date/Time: 07/31/2020/2:13:11 PM    Final    US Abdomen Limited RUQ (LIVER/GB)  Result Date: 07/23/2020 CLINICAL DATA:  Transaminitis. EXAM: ULTRASOUND ABDOMEN LIMITED RIGHT UPPER QUADRANT COMPARISON:  None. FINDINGS: Gallbladder: No gallstones or wall thickening visualized. No sonographic Murphy sign noted by sonographer. Common bile duct: Diameter: 8 mm with distal tapering.  No evidence of a duct stone. Liver: Coarsened echotexture. Normal overall liver size. No mass or focal lesion. Mild central intrahepatic bile duct dilation. Portal vein is patent on color Doppler imaging with normal direction of blood flow towards the liver. Other: None. IMPRESSION: 1. No acute findings. 2. Common bile duct measures 8 mm and there is mild central intrahepatic bile duct dilation. This is likely chronic. However, if there are symptoms biliary obstruction, follow-up MRCP or ERCP would be recommended.  3. Coarsened echotexture of the liver, nonspecific. Normal liver size. No masses. Electronically Signed   By: Lajean Manes M.D.   On: 07/23/2020 18:44  Labs: BNP (last 3 results) Recent Labs    07/22/20 1805 07/31/20 0544  BNP 734.0* [id number]*   Basic Metabolic Panel: Recent Labs  Lab 08/03/20 1728 08/04/20 0117 08/05/20 1809 08/06/20 0047 08/06/20 1608 08/07/20 0028 08/08/20 0040  NA  --    < > 133* 134* 134* 135 135  K  --    < > 5.8* 5.4* 5.3* 4.9 5.0  CL  --    < > 99 101 101 101 104  CO2  --    < > '26 25 25 24 24  '$ GLUCOSE  --    < > 154* 100* 111* 124* 148*  BUN  --    < > 59* 59* 61* 62* 63*  CREATININE  --    < > 5.38* 5.37* 5.04* 4.95* 4.45*  CALCIUM  --    < > 8.8* 8.5* 8.3* 8.5* 8.5*  MG 2.0  --   --   --   --  1.7  --    < > = values in this interval not displayed.   Liver Function Tests: No results for input(s): AST, ALT, ALKPHOS, BILITOT, PROT, ALBUMIN in the last 168 hours. No results for input(s): LIPASE, AMYLASE in the last 168 hours. No results for input(s): AMMONIA in the last 168 hours. CBC: Recent Labs  Lab 08/04/20 0117 08/04/20 1245 08/05/20 0035 08/06/20 0047 08/07/20 0028 08/08/20 0040  WBC 9.9  --  23.5* 10.8* 10.3 9.1  HGB 7.8* 8.4* 9.7* 8.0* 8.1* 8.3*  HCT 24.2* 25.7* 30.7* 24.6* 24.8* 25.2*  MCV 101.3*  --  102.7* 101.7* 100.8* 101.2*  PLT 279  --  420* 281 298 293   Cardiac Enzymes: No results for input(s): CKTOTAL, CKMB, CKMBINDEX, TROPONINI in the last 168 hours. BNP: Invalid input(s): POCBNP CBG: Recent Labs  Lab 08/07/20 1148 08/07/20 1629 08/07/20 2031 08/08/20 0722 08/08/20 1141  GLUCAP 121* 114* 136* 139* 156*   D-Dimer No results for input(s): DDIMER in the last 72 hours. Hgb A1c No results for input(s): HGBA1C in the last 72 hours. Lipid Profile No results for input(s): CHOL, HDL, LDLCALC, TRIG, CHOLHDL, LDLDIRECT in the last 72 hours. Thyroid function studies No results for input(s): TSH, T4TOTAL, T3FREE,  THYROIDAB in the last 72 hours.  Invalid input(s): FREET3 Anemia work up No results for input(s): VITAMINB12, FOLATE, FERRITIN, TIBC, IRON, RETICCTPCT in the last 72 hours. Urinalysis    Component Value Date/Time   COLORURINE RED (A) 08/04/2020 1841   APPEARANCEUR CLOUDY (A) 08/04/2020 1841   LABSPEC 1.004 (L) 08/04/2020 1841   PHURINE 6.0 08/04/2020 1841   GLUCOSEU NEGATIVE 08/04/2020 1841   GLUCOSEU NEGATIVE 02/18/2018 1216   HGBUR MODERATE (A) 08/04/2020 1841   BILIRUBINUR NEGATIVE 08/04/2020 1841   BILIRUBINUR negative 08/23/2017 0922   KETONESUR NEGATIVE 08/04/2020 1841   PROTEINUR 100 (A) 08/04/2020 1841   UROBILINOGEN 0.2 02/18/2018 1216   NITRITE NEGATIVE 08/04/2020 1841   LEUKOCYTESUR TRACE (A) 08/04/2020 1841   Sepsis Labs Invalid input(s): PROCALCITONIN,  WBC,  LACTICIDVEN Microbiology Recent Results (from the past 240 hour(s))  Urine Culture     Status: Abnormal   Collection Time: 08/04/20  6:41 PM   Specimen: Urine, Catheterized  Result Value Ref Range Status   Specimen Description URINE, CATHETERIZED  Final   Special Requests   Final    NONE Performed at [facility], [address], [place], [place] [address]    Culture >=100,000 COLONIES/mL ENTEROCOCCUS FAECALIS (A)  Final  Report Status 08/07/2020 FINAL  Final   Organism ID, Bacteria ENTEROCOCCUS FAECALIS (A)  Final      Susceptibility   Enterococcus faecalis - MIC*    AMPICILLIN <=2 SENSITIVE Sensitive     NITROFURANTOIN <=16 SENSITIVE Sensitive     VANCOMYCIN 1 SENSITIVE Sensitive     * >=100,000 COLONIES/mL ENTEROCOCCUS FAECALIS     Time coordinating discharge: 35 minutes  SIGNED: Antonieta Pert, MD  [facility] 08/08/2020, 1:04 PM  If 7PM-7AM, please contact night-coverage [email or web address]

## 2020-08-08 NOTE — Progress Notes (Signed)
Paged Dr. Danise Edge concerning pt's foley catheter at [facility] [place]. Dr. Danise Edge stated to leave foley catheter in place, and to cap the third port. A trial of void has been requested with [facility], pt should receive a call with a follow up appt. Nurse to supply pt with a leg bag on discharge.

## 2020-08-08 NOTE — Progress Notes (Signed)
Physical Therapy Treatment Patient Details Name: Philip Kerr MRN: [id number] DOB: Apr 09, 1933 Today's Date: 08/08/2020    History of Present Illness Pt is 87 y.o. male presented 07/22/20 to the emergency department with chest pain. His work-up in ED was significant for transaminitis, pneumonia, as well he was COVID-19 positive, hospital course was complicated by severe delirium, and worsening renal failure. Also complicated by traumatic catheter insertion and now requiring CBI.  On 7/29 recieved clearance from urology to resume PT/OT.  PMH significant for coronary artery disease with CABG in 1996, ischemic cardiomyopathy, type 2 diabetes mellitus, hypertension, and dementia.    PT Comments    Patient received in bed, still very impulsive and with very poor awareness of safety and deficits. Family reports behavior and impulsivity is at baseline. Really needs 2 person assist just for safety with lines. Still very impulsive and not using RW correctly/not following cues for safety, but on the other hand also refuses to try gait training without device. Tolerated progression of gait distance nicely today. Left in bed with all needs met, bed alarm active and family present. Would benefit from HHPT f/u, but very likely to refuse.     Follow Up Recommendations  Home health PT;Supervision/Assistance - 24 hour (likely to refuse)     Equipment Recommendations  Rolling walker with 5" wheels    Recommendations for Other Services       Precautions / Restrictions Precautions Precautions: Fall Precaution Comments: HOH, impulsive Restrictions Weight Bearing Restrictions: No    Mobility  Bed Mobility Overal bed mobility: Needs Assistance Bed Mobility: Supine to Sit;Sit to Supine     Supine to sit: Supervision Sit to supine: Supervision   General bed mobility comments: Supervision for safety and lines.    Transfers Overall transfer level: Needs assistance Equipment used: Rolling walker (2  wheeled) Transfers: Sit to/from Stand Sit to Stand: Min guard         General transfer comment: min guard for safety, pulls up on walker impulsively and does not follow cues for safety from therapist  Ambulation/Gait Ambulation/Gait assistance: Min guard Gait Distance (Feet): 120 Feet Assistive device: Rolling walker (2 wheeled) Gait Pattern/deviations: Step-through pattern;Decreased stride length;Trunk flexed Gait velocity: faster than is safe   General Gait Details: impulsive and unsfe with RW especially when turning and continues picking up RW. Does not follow cues for safety with device, and refuses to try walking without device. Needed second person for safe management of lines and balance.   Stairs             Wheelchair Mobility    Modified Rankin (Stroke Patients Only)       Balance Overall balance assessment: Needs assistance Sitting-balance support: No upper extremity supported;Feet supported Sitting balance-Leahy Scale: Good Sitting balance - Comments: able to sit edge of bed.   Standing balance support: Bilateral upper extremity supported;No upper extremity supported Standing balance-Leahy Scale: Poor Standing balance comment: reliant on RW, cues to avoid lifting walker during turns                            Cognition Arousal/Alertness: Awake/alert Behavior During Therapy: Impulsive Overall Cognitive Status: History of cognitive impairments - at baseline Area of Impairment: Safety/judgement;Awareness;Problem solving                               General Comments: Impulsivity and poor safety awareness is  baseline.      Exercises      General Comments General comments (skin integrity, edema, etc.): spouse and son present, report behaviors and impulsivity are at baseline      Pertinent Vitals/Pain Pain Assessment: Faces Faces Pain Scale: No hurt Pain Intervention(s): Limited activity within patient's  tolerance;Monitored during session    Home Living                      Prior Function            PT Goals (current goals can now be found in the care plan section) Acute Rehab PT Goals Patient Stated Goal: go home PT Goal Formulation: With patient/family Time For Goal Achievement: 08/09/20 Potential to Achieve Goals: Good Progress towards PT goals: Progressing toward goals    Frequency    Min 3X/week      PT Plan Current plan remains appropriate    Co-evaluation              AM-PAC PT "6 Clicks" Mobility   Outcome Measure  Help needed turning from your back to your side while in a flat bed without using bedrails?: A Little Help needed moving from lying on your back to sitting on the side of a flat bed without using bedrails?: A Little Help needed moving to and from a bed to a chair (including a wheelchair)?: A Little Help needed standing up from a chair using your arms (e.g., wheelchair or bedside chair)?: A Little Help needed to walk in hospital room?: A Little Help needed climbing 3-5 steps with a railing? : A Little 6 Click Score: 18    End of Session   Activity Tolerance: Patient tolerated treatment well Patient left: in bed;with call bell/phone within reach;with bed alarm set;with family/visitor present Nurse Communication: Mobility status PT Visit Diagnosis: Unsteadiness on feet (R26.81)     Time: 1660-6301 PT Time Calculation (min) (ACUTE ONLY): 20 min  Charges:  $Gait Training: 8-22 mins                    [name], DPT, PN1   Supplemental Physical Therapist [place]    Pager [phone] Acute Rehab Office [phone]

## 2020-08-08 NOTE — Progress Notes (Signed)
Discharge paperwork reviewed with pt, pt's wife, and pt's son. Verbalized understanding. Pt alert and oriented to self and place in no acute distress upon discharge. Pt's belongings taken with pt. Pt wheeled down to front lobby to be transported home via private vehicle by wife.

## 2020-08-08 NOTE — Progress Notes (Addendum)
Progress Note  Patient Name: Philip Kerr Date of Encounter: 08/08/2020  [facility] Cardiologist: Lauree Chandler, MD   Subjective   Patient states he wants to go home. He denied any chest pain, heart palpitation, dizziness, SOB, leg edema. Wife is at bedside.   Inpatient Medications    Scheduled Meds:  amiodarone  400 mg Oral BID   [START ON 08/21/2020] amiodarone  400 mg Oral Daily   Chlorhexidine Gluconate Cloth  6 each Topical Daily   feeding supplement (NEPRO CARB STEADY)  237 mL Oral [id number]   folic acid  1 mg Oral Daily   insulin aspart  0-5 Units Subcutaneous QHS   insulin aspart  0-9 Units Subcutaneous TID WC   magnesium oxide  400 mg Oral BID   rosuvastatin  20 mg Oral QHS   tamsulosin  0.4 mg Oral QPC supper   Continuous Infusions:  sodium chloride irrigation     PRN Meds: acetaminophen, opium-belladonna, guaiFENesin-dextromethorphan, lidocaine, ondansetron **OR** ondansetron (ZOFRAN) IV, oxybutynin, oxyCODONE, polyethylene glycol, polyvinyl alcohol, senna-docusate, traZODone   Vital Signs    Vitals:   08/08/20 0020 08/08/20 0410 08/08/20 0415 08/08/20 0723  BP: (!) 132/59 135/67  132/63  Pulse: 83 83  83  Resp: '17 16 16 15  '$ Temp: 98.4 F (36.9 C) 98.2 F (36.8 C)  97.9 F (36.6 C)  TempSrc: Oral Oral  Oral  SpO2: 93% 94%  96%  Weight:      Height:        Intake/Output Summary (Last 24 hours) at 08/08/2020 1108 Last data filed at 08/08/2020 0425 Gross per 24 hour  Intake 360 ml  Output 1750 ml  Net -1390 ml   Last 3 Weights 08/07/2020 08/06/2020 08/04/2020  Weight (lbs) 176 lb 5.9 oz 178 lb 9.2 oz 179 lb 3.7 oz  Weight (kg) 80 kg 81 kg 81.3 kg      Telemetry    Sinus rhythm with ventricular rate of  70s, occasional PVCs, no further WCT noted since 08/06/20 - Personally Reviewed  ECG    N/A this AM  - Personally Reviewed  Physical Exam   GEN: No acute distress.   Neck: No JVD Cardiac: RRR, no murmurs, rubs, or gallops.   Respiratory: Clear to auscultation bilaterally. On room air.  GI: Soft, nontender, non-distended  MS: No BLE edema; No deformity. Neuro:  Nonfocal  Psych: Mild irritation  Labs    High Sensitivity Troponin:   Recent Labs  Lab 07/22/20 0614 07/22/20 0840 07/22/20 1805 07/22/20 2005  TROPONINIHS 28* 24* 27* 32*      Chemistry Recent Labs  Lab 08/06/20 1608 08/07/20 0028 08/08/20 0040  NA 134* 135 135  K 5.3* 4.9 5.0  CL 101 101 104  CO2 '25 24 24  '$ GLUCOSE 111* 124* 148*  BUN 61* 62* 63*  CREATININE 5.04* 4.95* 4.45*  CALCIUM 8.3* 8.5* 8.5*  GFRNONAA 11* 11* 12*  ANIONGAP '8 10 7     '$ Hematology Recent Labs  Lab 08/06/20 0047 08/07/20 0028 08/08/20 0040  WBC 10.8* 10.3 9.1  RBC 2.42* 2.46* 2.49*  HGB 8.0* 8.1* 8.3*  HCT 24.6* 24.8* 25.2*  MCV 101.7* 100.8* 101.2*  MCH 33.1 32.9 33.3  MCHC 32.5 32.7 32.9  RDW 18.1* 17.9* 17.3*  PLT 281 298 293    BNPNo results for input(s): BNP, PROBNP in the last 168 hours.   DDimer No results for input(s): DDIMER in the last 168 hours.   Radiology    No  results found.  Cardiac Studies   Echo from 07/31/20:   1. Left ventricular ejection fraction, by estimation, is 45 to 50%. The  left ventricle has mildly decreased function. Left ventricular endocardial  border not optimally defined to evaluate regional wall motion. There is  severe asymmetric left ventricular hypertrophy of the septal segment. Left ventricular diastolic parameters are indeterminate.   2. Right ventricular systolic function is normal. The right ventricular  size is normal. Tricuspid regurgitation signal is inadequate for assessing  PA pressure.   3. Left atrial size was mildly dilated.   4. The mitral valve is abnormal. Moderate mitral valve regurgitation. No  evidence of mitral stenosis.   5. Tricuspid valve regurgitation is mild to moderate.   6. The aortic valve is tricuspid. There is mild calcification of the  aortic valve. There is mild  thickening of the aortic valve.   7. Aortic dilatation noted. There is mild dilatation of the aortic root,  measuring 40 mm.   Echo from 09/11/2017:  - Left ventricle: The cavity size was moderately dilated. Wall    thickness was increased in a pattern of severe LVH. Systolic    function was severely reduced. The estimated ejection fraction    was in the range of 25% to 30%. Diffuse hypokinesis. Left    ventricular diastolic function parameters were normal.  - Aortic valve: There was mild regurgitation.  - Mitral valve: There was moderate regurgitation.  - Left atrium: The atrium was moderately dilated.  - Atrial septum: No defect or patent foramen ovale was identified.   Patient Profile     87 year old male with history of CAD s/p CABG, left bundle branch block, ischemic cardiomyopathy, hx of systolic heart failure with EF 25% in 2019 Echo, diabetes, CKD stage III, hypertension, initially came to ER on 07/22/20 for chest pain, felt pain was noncardiac and had normal Trop. He was not interested in a stress test for further evaluation or any aggressive cardiovascular intervention / care, historically has been the same.  He was later admitted for COVID-19 and pneumonia, later developed AKI, given IVF and subsequently developed acute CHF.  Renal function worsened with diuresis, diuretic stopped, nephrology consulted,  felt ATN from hypoperfusion and hypotension and ARB use. Cardiology asked to re-see patient on 08/01/20 for CHF with worsening renal index in the setting of diuresis and BNP elevated at 4160, although nephrology has been managing diuretics. Started on metoprolol for NSVTs, later had 8 second pauses and high grade AV block, with dizziness and brief LOC, BB was stopped and long pauses resolved.  Cardiology signed off on 7/29 and re-called on 08/07/20 due to 45 runs of wide complex tachycardia without symptoms. He was started on amiodarone.   Assessment & Plan    Wide complex tachycardia on  08/06/20, asymptomatic  - BB was started for and GDMT for CHF and NSVT initially, stopped due to >8 sec pauses and high grade AV block on 08/03/20, 7/30 with WCT, started on PO amiodarone loading (400 mg PO BID for 14 days then 400 mg PO Daily) for suppression, no further episodes of WCT  -- appt arranged with cardiology on 08/25/20 for follow up   High grade AV block with 8 second pauses 08/03/20 NSVT - felt due to beta blocker initiation, this has been discontinued   Acute on chronic systolic heart failure - no CHF evidence on 7/15, admitted for COVID and pneumonia, AKI onset on 7/17 with low UOP, given IVF up to 4L /  day, 7/24 BNP was 4160 and cardio called back for concern of CHF, nephrology has been managing diuretic , CHF likely due to renal failure , currently off diuretics and renal index is slowly improving  - Echo repeated on 07/31/20 showed improved EF to 45-50% from previous 25% in 2019, moderate MR, mild to moderate TR - GDMT: intolerant to BB, not candidate for ARNI/ACEI/ARB/MRA/SGLT2I due to AKI , may add Bidil if BP requires later  - appt arranged with cardiology on 08/25/20 for re-evaluation - clinically euvolemic today   CAD with remote CABG - chest pain on 07/22/20 - Hs trop 24 >27 >32 - no acute findings on EKG, chronic left bundle branch block - EF has recovered comparing to 2019 Echo  - he was not interested in further cardiac workup and wishes conservative approach  - non-cardiac chest pain had resolve now, possible due to COVID-19 +pneumonia at the time   HTN - BP stable not requiring meds  , may add Bidil if BP elevated later for GDMT for CHF  COVID-19 pneumonia  Hematuria BPH Urinary retention Type 2 DM Dementia with episodes of delirium  Depression - managed per IM   For questions or updates, please contact [facility] Please consult [email or web address] for contact info under      Signed, Margie Billet, NP  08/08/2020, 11:08 AM    History and all data above  reviewed.  Patient examined.  I agree with the findings as above.  The patient feels well and he has no pain or SOB.  He is anxious to go home. The patient exam reveals COR:RRR  ,  Lungs: Clear,  Abd: Positive bowel sounds, no rebound no guarding, Ext No edema  .  All available labs, radiology testing, previous records reviewed. Agree with documented assessment and plan. Arrhythmia:  Agree with amiodarone.  Discussed with the patient and family to be aware of palpitations, dizziness, syncope or presyncope.  Follow up arranged.      Jeneen Rinks Chyanne Kohut  3:34 PM  08/08/2020

## 2020-08-08 NOTE — Progress Notes (Addendum)
Occupational Therapy Treatment Patient Details Name: Philip Kerr MRN: [id number] DOB: 29-Dec-1933 Today's Date: 08/08/2020    History of present illness Pt is 87 y.o. male presented 07/22/20 to the emergency department with chest pain. His work-up in ED was significant for transaminitis, pneumonia, as well he was COVID-19 positive, hospital course was complicated by severe delirium, and worsening renal failure. Also complicated by traumatic catheter insertion and now requiring CBI.  On 7/29 recieved clearance from urology to resume PT/OT.  PMH significant for coronary artery disease with CABG in 1996, ischemic cardiomyopathy, type 2 diabetes mellitus, hypertension, and dementia.   OT comments  Pt remains impulsive with poor safety awareness requiring min guard assist and verbal cues for safety for OOB activity. Pt is not receptive to education, family is aware and states pt is impulsive at baseline. Pt is agreeable to RW for home and wife asking for a tub seat and a 3 in 1 to keep beside pt's bed at home.   Follow Up Recommendations  No OT follow up    Equipment Recommendations  Tub bench,  3 in 1   Recommendations for Other Services      Precautions / Restrictions Precautions Precautions: Fall Precaution Comments: HOH, impulsive       Mobility Bed Mobility Overal bed mobility: Needs Assistance Bed Mobility: Supine to Sit;Sit to Supine     Supine to sit: Supervision Sit to supine: Supervision   General bed mobility comments: Supervision for safety and lines.    Transfers Overall transfer level: Needs assistance Equipment used: Rolling walker (2 wheeled) Transfers: Sit to/from Stand Sit to Stand: Min guard         General transfer comment: min guard for safety, pulls up on walker impulsively    Balance Overall balance assessment: Needs assistance Sitting-balance support: No upper extremity supported;Feet supported Sitting balance-Leahy Scale: Good        Standing balance-Leahy Scale: Poor Standing balance comment: reliant on RW, cues to avoid lifting walker during turns                           ADL either performed or assessed with clinical judgement   ADL Overall ADL's : Needs assistance/impaired                 Upper Body Dressing : Supervision/safety;Sitting                   Functional mobility during ADLs: Min guard;Rolling walker;+2 for safety/equipment General ADL Comments: Pt agreeable to RW, family wants a new tub seat.      Vision       Perception     Praxis      Cognition Arousal/Alertness: Awake/alert Behavior During Therapy: Impulsive Overall Cognitive Status: History of cognitive impairments - at baseline                                 General Comments: Impulsivity and poor safety awareness is baseline.        Exercises     Shoulder Instructions       General Comments      Pertinent Vitals/ Pain       Pain Assessment: Faces Faces Pain Scale: No hurt  Home Living  Prior Functioning/Environment              Frequency  Min 2X/week        Progress Toward Goals  OT Goals(current goals can now be found in the care plan section)  Progress towards OT goals: Progressing toward goals  Acute Rehab OT Goals Patient Stated Goal: go home OT Goal Formulation: With patient Time For Goal Achievement: 08/10/20 Potential to Achieve Goals: Good  Plan Discharge plan remains appropriate    Co-evaluation                 AM-PAC OT "6 Clicks" Daily Activity     Outcome Measure   Help from another person eating meals?: None Help from another person taking care of personal grooming?: A Little Help from another person toileting, which includes using toliet, bedpan, or urinal?: A Little Help from another person bathing (including washing, rinsing, drying)?: A Little Help from another person to  put on and taking off regular upper body clothing?: A Little Help from another person to put on and taking off regular lower body clothing?: A Little 6 Click Score: 19    End of Session Equipment Utilized During Treatment: Rolling walker  OT Visit Diagnosis: Unsteadiness on feet (R26.81);Muscle weakness (generalized) (M62.81);Other symptoms and signs involving cognitive function   Activity Tolerance Patient tolerated treatment well   Patient Left in bed;with call bell/phone within reach;with bed alarm set;with family/visitor present   Nurse Communication          Time: [id number] OT Time Calculation (min): 19 min  Charges: OT General Charges $OT Visit: 1 Visit  Nestor Lewandowsky, OTR/L Acute Rehabilitation Services Pager: [phone] Office: [phone]   Malka So 08/08/2020, 3:01 PM

## 2020-08-10 ENCOUNTER — Telehealth: Payer: Self-pay

## 2020-08-10 ENCOUNTER — Inpatient Hospital Stay: Payer: Medicare HMO | Admitting: Internal Medicine

## 2020-08-10 DIAGNOSIS — N179 Acute kidney failure, unspecified: Secondary | ICD-10-CM | POA: Diagnosis not present

## 2020-08-10 DIAGNOSIS — N1831 Chronic kidney disease, stage 3a: Secondary | ICD-10-CM | POA: Diagnosis not present

## 2020-08-10 DIAGNOSIS — J1282 Pneumonia due to coronavirus disease 2019: Secondary | ICD-10-CM | POA: Diagnosis not present

## 2020-08-10 DIAGNOSIS — I5023 Acute on chronic systolic (congestive) heart failure: Secondary | ICD-10-CM | POA: Diagnosis not present

## 2020-08-10 DIAGNOSIS — E1122 Type 2 diabetes mellitus with diabetic chronic kidney disease: Secondary | ICD-10-CM | POA: Diagnosis not present

## 2020-08-10 DIAGNOSIS — U071 COVID-19: Secondary | ICD-10-CM | POA: Diagnosis not present

## 2020-08-10 DIAGNOSIS — N401 Enlarged prostate with lower urinary tract symptoms: Secondary | ICD-10-CM | POA: Diagnosis not present

## 2020-08-10 DIAGNOSIS — J181 Lobar pneumonia, unspecified organism: Secondary | ICD-10-CM | POA: Diagnosis not present

## 2020-08-10 DIAGNOSIS — I130 Hypertensive heart and chronic kidney disease with heart failure and stage 1 through stage 4 chronic kidney disease, or unspecified chronic kidney disease: Secondary | ICD-10-CM | POA: Diagnosis not present

## 2020-08-10 NOTE — Telephone Encounter (Signed)
Transition Care Management Follow-up Telephone Call Date of discharge and from where: 08/08/2020 from [facility] How have you been since you were released from the hospital? Per son: not sleeping too well, still coughing and unsteady on feet. Any questions or concerns? No  Items Reviewed: Did the pt receive and understand the discharge instructions provided? Yes  Medications obtained and verified? Yes  Other? No  Any new allergies since your discharge? No  Dietary orders reviewed? Yes; low sodium heart healthy; renal diet fluid restriction 1200 mL  Do you have support at home? Yes , wife and son  [place] and Equipment/Supplies: Were home health services ordered? yes If so, what is the name of the agency? [facility]  Has the agency set up a time to come to the patient's home? no Were any new equipment or medical supplies ordered?  No What is the name of the medical supply agency? N/a Were you able to get the supplies/equipment? not applicable Do you have any questions related to the use of the equipment or supplies? No  Functional Questionnaire: (I = Independent and D = Dependent) ADLs: I  Bathing/Dressing- I  Meal Prep- D  Eating- I  Maintaining continence- I  Transferring/Ambulation- I, has a walker, but hangs on to the wall from Bathroom to Living Area  Managing Meds- I  Follow up appointments reviewed:  PCP Hospital f/u appt confirmed? Yes  Scheduled to see Cathlean Cower, MD on 08/12/2020 @ 3:00 pm. [facility] f/u appt confirmed? Yes  Scheduled to see [facility] in 2 weeks. Are transportation arrangements needed? No  If their condition worsens, is the pt aware to call PCP or go to the Emergency Dept.? Yes Was the patient provided with contact information for the PCP's office or ED? Yes Was to pt encouraged to call back with questions or concerns? Yes

## 2020-08-11 DIAGNOSIS — I130 Hypertensive heart and chronic kidney disease with heart failure and stage 1 through stage 4 chronic kidney disease, or unspecified chronic kidney disease: Secondary | ICD-10-CM | POA: Diagnosis not present

## 2020-08-11 DIAGNOSIS — J181 Lobar pneumonia, unspecified organism: Secondary | ICD-10-CM | POA: Diagnosis not present

## 2020-08-11 DIAGNOSIS — N1831 Chronic kidney disease, stage 3a: Secondary | ICD-10-CM | POA: Diagnosis not present

## 2020-08-11 DIAGNOSIS — E1122 Type 2 diabetes mellitus with diabetic chronic kidney disease: Secondary | ICD-10-CM | POA: Diagnosis not present

## 2020-08-11 DIAGNOSIS — N401 Enlarged prostate with lower urinary tract symptoms: Secondary | ICD-10-CM | POA: Diagnosis not present

## 2020-08-11 DIAGNOSIS — I5023 Acute on chronic systolic (congestive) heart failure: Secondary | ICD-10-CM | POA: Diagnosis not present

## 2020-08-11 DIAGNOSIS — N179 Acute kidney failure, unspecified: Secondary | ICD-10-CM | POA: Diagnosis not present

## 2020-08-11 DIAGNOSIS — J1282 Pneumonia due to coronavirus disease 2019: Secondary | ICD-10-CM | POA: Diagnosis not present

## 2020-08-11 DIAGNOSIS — U071 COVID-19: Secondary | ICD-10-CM | POA: Diagnosis not present

## 2020-08-12 ENCOUNTER — Telehealth: Payer: Self-pay | Admitting: Internal Medicine

## 2020-08-12 ENCOUNTER — Inpatient Hospital Stay: Payer: Medicare HMO | Admitting: Internal Medicine

## 2020-08-12 ENCOUNTER — Encounter: Payer: Self-pay | Admitting: Internal Medicine

## 2020-08-12 ENCOUNTER — Ambulatory Visit (INDEPENDENT_AMBULATORY_CARE_PROVIDER_SITE_OTHER): Payer: Medicare HMO | Admitting: Internal Medicine

## 2020-08-12 ENCOUNTER — Other Ambulatory Visit: Payer: Self-pay

## 2020-08-12 VITALS — BP 122/66 | HR 64 | Temp 97.7°F | Ht 70.0 in | Wt 174.0 lb

## 2020-08-12 DIAGNOSIS — N179 Acute kidney failure, unspecified: Secondary | ICD-10-CM

## 2020-08-12 DIAGNOSIS — U071 COVID-19: Secondary | ICD-10-CM | POA: Diagnosis not present

## 2020-08-12 DIAGNOSIS — Z8616 Personal history of COVID-19: Secondary | ICD-10-CM | POA: Diagnosis not present

## 2020-08-12 DIAGNOSIS — J1282 Pneumonia due to coronavirus disease 2019: Secondary | ICD-10-CM | POA: Diagnosis not present

## 2020-08-12 DIAGNOSIS — R339 Retention of urine, unspecified: Secondary | ICD-10-CM | POA: Diagnosis not present

## 2020-08-12 DIAGNOSIS — E1122 Type 2 diabetes mellitus with diabetic chronic kidney disease: Secondary | ICD-10-CM | POA: Diagnosis not present

## 2020-08-12 DIAGNOSIS — J189 Pneumonia, unspecified organism: Secondary | ICD-10-CM | POA: Diagnosis not present

## 2020-08-12 DIAGNOSIS — I130 Hypertensive heart and chronic kidney disease with heart failure and stage 1 through stage 4 chronic kidney disease, or unspecified chronic kidney disease: Secondary | ICD-10-CM | POA: Diagnosis not present

## 2020-08-12 DIAGNOSIS — J181 Lobar pneumonia, unspecified organism: Secondary | ICD-10-CM | POA: Diagnosis not present

## 2020-08-12 DIAGNOSIS — N401 Enlarged prostate with lower urinary tract symptoms: Secondary | ICD-10-CM | POA: Diagnosis not present

## 2020-08-12 DIAGNOSIS — I5023 Acute on chronic systolic (congestive) heart failure: Secondary | ICD-10-CM | POA: Diagnosis not present

## 2020-08-12 DIAGNOSIS — N1831 Chronic kidney disease, stage 3a: Secondary | ICD-10-CM | POA: Diagnosis not present

## 2020-08-12 LAB — CBC WITH DIFFERENTIAL/PLATELET
Basophils Absolute: 0.1 10*3/uL (ref 0.0–0.1)
Basophils Relative: 1.8 % (ref 0.0–3.0)
Eosinophils Absolute: 0.4 10*3/uL (ref 0.0–0.7)
Eosinophils Relative: 5.6 % — ABNORMAL HIGH (ref 0.0–5.0)
HCT: 28.3 % — ABNORMAL LOW (ref 39.0–52.0)
Hemoglobin: 9.3 g/dL — ABNORMAL LOW (ref 13.0–17.0)
Lymphocytes Relative: 23.3 % (ref 12.0–46.0)
Lymphs Abs: 1.8 10*3/uL (ref 0.7–4.0)
MCHC: 32.9 g/dL (ref 30.0–36.0)
MCV: 99.8 fl (ref 78.0–100.0)
Monocytes Absolute: 0.6 10*3/uL (ref 0.1–1.0)
Monocytes Relative: 7.2 % (ref 3.0–12.0)
Neutro Abs: 4.8 10*3/uL (ref 1.4–7.7)
Neutrophils Relative %: 62.1 % (ref 43.0–77.0)
Platelets: 312 10*3/uL (ref 150.0–400.0)
RBC: 2.84 Mil/uL — ABNORMAL LOW (ref 4.22–5.81)
RDW: 17.8 % — ABNORMAL HIGH (ref 11.5–15.5)
WBC: 7.7 10*3/uL (ref 4.0–10.5)

## 2020-08-12 LAB — BASIC METABOLIC PANEL
BUN: 45 mg/dL — ABNORMAL HIGH (ref 6–23)
CO2: 28 mEq/L (ref 19–32)
Calcium: 8.7 mg/dL (ref 8.4–10.5)
Chloride: 102 mEq/L (ref 96–112)
Creatinine, Ser: 3.27 mg/dL — ABNORMAL HIGH (ref 0.40–1.50)
GFR: 16.63 mL/min — ABNORMAL LOW (ref >60.00)
Glucose, Bld: 141 mg/dL — ABNORMAL HIGH (ref 70–99)
Potassium: 4.3 mEq/L (ref 3.5–5.1)
Sodium: 137 mEq/L (ref 135–145)

## 2020-08-12 NOTE — Assessment & Plan Note (Signed)
Improved, no further antibx needed for now, to f/u any worsening symptoms or concerns

## 2020-08-12 NOTE — Assessment & Plan Note (Signed)
Noted, no specific tx for now

## 2020-08-12 NOTE — Assessment & Plan Note (Signed)
Also for f/u lab today 

## 2020-08-12 NOTE — Patient Instructions (Signed)
Please continue all other medications as before, and refills have been done if requested.  Please have the pharmacy call with any other refills you may need.  Please continue your efforts at being more active, low cholesterol diet, and weight control.  You are otherwise up to date with prevention measures today.  Please keep your appointments with your specialists as you may have planned - [facility] on Aug 17  Please also make sure for him to be seen at [facility] in the next few weeks  .Please go to the LAB at the blood drawing area for the tests to be done  You will be contacted by phone if any changes need to be made immediately.  Otherwise, you will receive a letter about your results with an explanation, but please check with MyChart first.  Please remember to sign up for MyChart if you have not done so, as this will be important to you in the future with finding out test results, communicating by private email, and scheduling acute appointments online when needed.  Please make an Appointment to return in 3 months

## 2020-08-12 NOTE — Telephone Encounter (Signed)
Ok verbals 

## 2020-08-12 NOTE — Telephone Encounter (Signed)
[place] Name: [facility] Agency Name: [place] Phone #: [phone] Service Requested: OT Frequency of Visits: 1 week 4

## 2020-08-12 NOTE — Assessment & Plan Note (Signed)
For f/u urology aug 17 as planned for voiding trial

## 2020-08-12 NOTE — Progress Notes (Signed)
Chief Complaint: follow up recent hospn 7/15 - 8/1       HPI:  Philip Kerr is a 87 y.o. male here with above, covid +, tx for presumed bacterial CAP - LLL with chest pain, delirium, generalized weakness, transaminitis, with hospital course complicatd by acute urinary retention requiring foley in the setting of AKI on CKD seen per urology and nephrology.  Pt had transient gross hematuria on heparin and ASA then held.   CBI placed and pt is to f/u with urology aug 17 for voiding trial.  Pt finished IV antibx with some improvement in renal fxn.   Also - Acute on chronic systolic CHF EF 123456 , BNP elevated 4160-echo shows  7/24-EF 45 to 50% severe asymmetric left ventricular hypertrophy moderate MR monitor intake output .  CHF team input appreciated -not a candidate for ARB/ACE/ARN I due to AKI, BP too soft to add hydralazine/Imdur, Toprol-XL 12.5 mg was added but discontinued due to high-grade AV block.  Diuretics on hold due to AKI .  Pt was d/c with plan for Check BMP in 3 to 4 days to monitor potassium level and renal function.  Please call [facility] office for follow-up  Continue on low potassium diet  Follow-up with [facility] for voiding trial with urology in 1 to 2 weeks.  Today, pt has no specific complaints - does have persistent scant prod cough, but Pt denies chest pain, increased sob or doe, wheezing, orthopnea, PND, increased LE swelling, palpitations, dizziness or syncope.   Pt denies polydipsia, polyuria, or new focal neuro s/s.    No fever, chills.         Wt Readings from Last 3 Encounters:  08/12/20 174 lb (78.9 kg)  08/07/20 176 lb 5.9 oz (80 kg)  07/22/20 155 lb (70.3 kg)   BP Readings from Last 3 Encounters:  08/12/20 122/66  08/08/20 (!) 130/56  07/22/20 (!) 148/61        Transitional Care Management elements noted today: 1)  Date of D/C: as above 2)  Medication reconciliation:  done today at end visit 3)  Review of D/C summary or other information:  done  today 4)  Review of need for f/u on pending diagnostic tests and treatments:  done today - none 5)  Review of need for Interaction with other providers who will assume or resume care of pt specific problems: done today - none 6)  Education of patient/family/guardian or caregiver: done today with son who plans to make sure to make appt with [facility] as this has not yet been done  Past Medical History:  Diagnosis Date   Aortic insufficiency    Coronary artery disease    coronary artery bypass graft x 5 in 1996   Diabetes mellitus    Hypercholesterolemia    non-insulin dependent   Hypertension    Ischemic cardiomyopathy    Mitral regurgitation    PVC (premature ventricular contraction)    Past Surgical History:  Procedure Laterality Date   CORONARY ARTERY BYPASS GRAFT     LUNG SURGERY     OTHER SURGICAL HISTORY     percutaneous coronary intervention of the  posterolateral segment on 01/27/1998    reports that he has never smoked. He has never used smokeless tobacco. He reports that he does not drink alcohol and does not use drugs. family history is not on file. No Known Allergies Current Outpatient Medications on File Prior to Visit  Medication  Sig Dispense Refill   albuterol (PROVENTIL HFA;VENTOLIN HFA) 108 (90 Base) MCG/ACT inhaler Inhale 2 puffs into the lungs every 6 (six) hours as needed for wheezing or shortness of breath. 1 Inhaler 11   amiodarone (PACERONE) 400 MG tablet Take 400 mg bid x 13 days then 200 mg daily 60 tablet 0   aspirin EC 81 MG tablet Take 81 mg by mouth every morning. Swallow whole.     Cholecalciferol (VITAMIN D3 PO) Take 1 capsule by mouth at bedtime.     oxybutynin (DITROPAN) 5 MG tablet Take 1 tablet (5 mg total) by mouth every 8 (eight) hours as needed for up to 14 days for bladder spasms. 30 tablet 0   oxyCODONE (OXY IR/ROXICODONE) 5 MG immediate release tablet Take 1 tablet (5 mg total) by mouth every 6 (six) hours as needed for up to 12 doses  for breakthrough pain (hold for sedation). 12 tablet 0   rosuvastatin (CRESTOR) 20 MG tablet Take 1 tablet (20 mg total) by mouth daily. 90 tablet 3   tamsulosin (FLOMAX) 0.4 MG CAPS capsule TAKE 1 CAPSULE BY MOUTH EVERY DAY 90 capsule 1   tamsulosin (FLOMAX) 0.4 MG CAPS capsule Take 1 capsule (0.4 mg total) by mouth daily after supper. 30 capsule 0   traZODone (DESYREL) 100 MG tablet Take 1 tablet (100 mg total) by mouth at bedtime as needed for up to 7 days for sleep. 7 tablet 0   No current facility-administered medications on file prior to visit.        ROS:  All others reviewed and negative.  Objective        PE:  BP 122/66 (BP Location: Left Arm, Patient Position: Sitting, Cuff Size: Normal)   Pulse 64   Temp 97.7 F (36.5 C) (Oral)   Ht '5\' 10"'$  (1.778 m)   Wt 174 lb (78.9 kg)   SpO2 98%   BMI 24.97 kg/m                 Constitutional: Pt appears in NAD               HENT: Head: NCAT.                Right Ear: External ear normal.                 Left Ear: External ear normal.                Eyes: . Pupils are equal, round, and reactive to light. Conjunctivae and EOM are normal               Nose: without d/c or deformity               Neck: Neck supple. Gross normal ROM               Cardiovascular: Normal rate and regular rhythm.                 Pulmonary/Chest: Effort normal and breath sounds without rales or wheezing.                Abd:  Soft, NT, ND, + BS, no organomegaly               Neurological: Pt is alert. At baseline orientation, motor grossly intact               Skin: Skin is warm. No rashes, no other new lesions, LE edema -  none               Psychiatric: Pt behavior is normal without agitation   Micro: none  Cardiac tracings I have personally interpreted today:  none  Pertinent Radiological findings (summarize): none   Lab Results  Component Value Date   WBC 7.7 08/12/2020   HGB 9.3 (L) 08/12/2020   HCT 28.3 (L) 08/12/2020   PLT 312.0 08/12/2020    GLUCOSE 141 (H) 08/12/2020   CHOL 103 12/22/2019   TRIG 184.0 (H) 12/22/2019   HDL 47.40 12/22/2019   LDLCALC 18 12/22/2019   ALT 32 07/28/2020   AST 19 07/28/2020   NA 137 08/12/2020   K 4.3 08/12/2020   CL 102 08/12/2020   CREATININE 3.27 (H) 08/12/2020   BUN 45 (H) 08/12/2020   CO2 28 08/12/2020   TSH 3.00 07/17/2019   PSA 1.97 09/06/2017   HGBA1C 6.4 (H) 07/23/2020   MICROALBUR 4.2 07/17/2019   Assessment/Plan:  Philip Kerr is a 87 y.o. White or Caucasian [1] male with  has a past medical history of Aortic insufficiency, Coronary artery disease, Diabetes mellitus, Hypercholesterolemia, Hypertension, Ischemic cardiomyopathy, Mitral regurgitation, and PVC (premature ventricular contraction).  Lab test positive for detection of COVID-19 virus Noted, no specific tx for now  Community acquired pneumonia of left lower lobe of lung Improved, no further antibx needed for now, to f/u any worsening symptoms or concerns  AKI (acute kidney injury) ([place]) Also for f/u lab today  Urinary retention For f/u urology aug 17 as planned for voiding trial  Followup: Return in about 3 months (around 11/12/2020).  Cathlean Cower, MD 08/12/2020 10:50 PM [facility]

## 2020-08-14 ENCOUNTER — Encounter: Payer: Self-pay | Admitting: Internal Medicine

## 2020-08-15 DIAGNOSIS — I130 Hypertensive heart and chronic kidney disease with heart failure and stage 1 through stage 4 chronic kidney disease, or unspecified chronic kidney disease: Secondary | ICD-10-CM | POA: Diagnosis not present

## 2020-08-15 DIAGNOSIS — N1831 Chronic kidney disease, stage 3a: Secondary | ICD-10-CM | POA: Diagnosis not present

## 2020-08-15 DIAGNOSIS — N179 Acute kidney failure, unspecified: Secondary | ICD-10-CM | POA: Diagnosis not present

## 2020-08-15 DIAGNOSIS — J181 Lobar pneumonia, unspecified organism: Secondary | ICD-10-CM | POA: Diagnosis not present

## 2020-08-15 DIAGNOSIS — J1282 Pneumonia due to coronavirus disease 2019: Secondary | ICD-10-CM | POA: Diagnosis not present

## 2020-08-15 DIAGNOSIS — I5023 Acute on chronic systolic (congestive) heart failure: Secondary | ICD-10-CM | POA: Diagnosis not present

## 2020-08-15 DIAGNOSIS — N401 Enlarged prostate with lower urinary tract symptoms: Secondary | ICD-10-CM | POA: Diagnosis not present

## 2020-08-15 DIAGNOSIS — E1122 Type 2 diabetes mellitus with diabetic chronic kidney disease: Secondary | ICD-10-CM | POA: Diagnosis not present

## 2020-08-15 DIAGNOSIS — U071 COVID-19: Secondary | ICD-10-CM | POA: Diagnosis not present

## 2020-08-15 NOTE — Telephone Encounter (Signed)
Verbals left on [employer].

## 2020-08-16 DIAGNOSIS — Z8616 Personal history of COVID-19: Secondary | ICD-10-CM | POA: Diagnosis not present

## 2020-08-16 DIAGNOSIS — I5023 Acute on chronic systolic (congestive) heart failure: Secondary | ICD-10-CM | POA: Diagnosis not present

## 2020-08-16 DIAGNOSIS — N1832 Chronic kidney disease, stage 3b: Secondary | ICD-10-CM | POA: Diagnosis not present

## 2020-08-16 DIAGNOSIS — N179 Acute kidney failure, unspecified: Secondary | ICD-10-CM | POA: Diagnosis not present

## 2020-08-16 DIAGNOSIS — I129 Hypertensive chronic kidney disease with stage 1 through stage 4 chronic kidney disease, or unspecified chronic kidney disease: Secondary | ICD-10-CM | POA: Diagnosis not present

## 2020-08-16 DIAGNOSIS — R339 Retention of urine, unspecified: Secondary | ICD-10-CM | POA: Diagnosis not present

## 2020-08-18 ENCOUNTER — Telehealth: Payer: Self-pay | Admitting: Internal Medicine

## 2020-08-18 DIAGNOSIS — N401 Enlarged prostate with lower urinary tract symptoms: Secondary | ICD-10-CM | POA: Diagnosis not present

## 2020-08-18 DIAGNOSIS — N179 Acute kidney failure, unspecified: Secondary | ICD-10-CM | POA: Diagnosis not present

## 2020-08-18 DIAGNOSIS — J1282 Pneumonia due to coronavirus disease 2019: Secondary | ICD-10-CM | POA: Diagnosis not present

## 2020-08-18 DIAGNOSIS — J181 Lobar pneumonia, unspecified organism: Secondary | ICD-10-CM | POA: Diagnosis not present

## 2020-08-18 DIAGNOSIS — N1831 Chronic kidney disease, stage 3a: Secondary | ICD-10-CM | POA: Diagnosis not present

## 2020-08-18 DIAGNOSIS — I5023 Acute on chronic systolic (congestive) heart failure: Secondary | ICD-10-CM | POA: Diagnosis not present

## 2020-08-18 DIAGNOSIS — U071 COVID-19: Secondary | ICD-10-CM | POA: Diagnosis not present

## 2020-08-18 DIAGNOSIS — E1122 Type 2 diabetes mellitus with diabetic chronic kidney disease: Secondary | ICD-10-CM | POA: Diagnosis not present

## 2020-08-18 DIAGNOSIS — I130 Hypertensive heart and chronic kidney disease with heart failure and stage 1 through stage 4 chronic kidney disease, or unspecified chronic kidney disease: Secondary | ICD-10-CM | POA: Diagnosis not present

## 2020-08-18 NOTE — Telephone Encounter (Signed)
Please consider UC visit if still having any CP and/or lower BP

## 2020-08-18 NOTE — Telephone Encounter (Signed)
Vinnie Level From [facility] called in to give report on patient  Patient BP was 104/40 (08.11.22)  Patient was complaining of not feeling well over night into the morning.. having chest pain which did stop when patient took pain relief.Marland Kitchen also having dizziness w/ standing  Callback # [phone]

## 2020-08-19 ENCOUNTER — Telehealth: Payer: Self-pay | Admitting: Cardiovascular Disease

## 2020-08-19 DIAGNOSIS — N179 Acute kidney failure, unspecified: Secondary | ICD-10-CM | POA: Diagnosis not present

## 2020-08-19 DIAGNOSIS — I5023 Acute on chronic systolic (congestive) heart failure: Secondary | ICD-10-CM | POA: Diagnosis not present

## 2020-08-19 DIAGNOSIS — I130 Hypertensive heart and chronic kidney disease with heart failure and stage 1 through stage 4 chronic kidney disease, or unspecified chronic kidney disease: Secondary | ICD-10-CM | POA: Diagnosis not present

## 2020-08-19 DIAGNOSIS — U071 COVID-19: Secondary | ICD-10-CM | POA: Diagnosis not present

## 2020-08-19 DIAGNOSIS — E1122 Type 2 diabetes mellitus with diabetic chronic kidney disease: Secondary | ICD-10-CM | POA: Diagnosis not present

## 2020-08-19 DIAGNOSIS — J1282 Pneumonia due to coronavirus disease 2019: Secondary | ICD-10-CM | POA: Diagnosis not present

## 2020-08-19 DIAGNOSIS — N401 Enlarged prostate with lower urinary tract symptoms: Secondary | ICD-10-CM | POA: Diagnosis not present

## 2020-08-19 DIAGNOSIS — J181 Lobar pneumonia, unspecified organism: Secondary | ICD-10-CM | POA: Diagnosis not present

## 2020-08-19 DIAGNOSIS — N1831 Chronic kidney disease, stage 3a: Secondary | ICD-10-CM | POA: Diagnosis not present

## 2020-08-19 NOTE — Telephone Encounter (Signed)
Pt c/o BP issue: STAT if pt c/o blurred vision, one-sided weakness or slurred speech  1. What are your last 5 BP readings?  Ranging 110-120/50's HR ranging 52-55  2. Are you having any other symptoms (ex. Dizziness, headache, blurred vision, passed out)? Fatigue & occasional dizziness   3. What is your BP issue? Hypotension

## 2020-08-19 NOTE — Telephone Encounter (Signed)
Spoke with [place].  Pt is feeling weak in addition to getting BPs/HRs on lower side.  He was not orthostatic.  Reviewed medications.  Pt was still taking Metoprolol 25 mg BID which was to be discontinued on hospital dc 08/08/20.  She is calling the pt right now to adv not to take any more metoprolol.  They will monitor vs in the home.

## 2020-08-22 DIAGNOSIS — J181 Lobar pneumonia, unspecified organism: Secondary | ICD-10-CM | POA: Diagnosis not present

## 2020-08-22 DIAGNOSIS — E1122 Type 2 diabetes mellitus with diabetic chronic kidney disease: Secondary | ICD-10-CM | POA: Diagnosis not present

## 2020-08-22 DIAGNOSIS — N401 Enlarged prostate with lower urinary tract symptoms: Secondary | ICD-10-CM | POA: Diagnosis not present

## 2020-08-22 DIAGNOSIS — N179 Acute kidney failure, unspecified: Secondary | ICD-10-CM | POA: Diagnosis not present

## 2020-08-22 DIAGNOSIS — I5023 Acute on chronic systolic (congestive) heart failure: Secondary | ICD-10-CM | POA: Diagnosis not present

## 2020-08-22 DIAGNOSIS — I130 Hypertensive heart and chronic kidney disease with heart failure and stage 1 through stage 4 chronic kidney disease, or unspecified chronic kidney disease: Secondary | ICD-10-CM | POA: Diagnosis not present

## 2020-08-22 DIAGNOSIS — U071 COVID-19: Secondary | ICD-10-CM | POA: Diagnosis not present

## 2020-08-22 DIAGNOSIS — N1831 Chronic kidney disease, stage 3a: Secondary | ICD-10-CM | POA: Diagnosis not present

## 2020-08-22 DIAGNOSIS — J1282 Pneumonia due to coronavirus disease 2019: Secondary | ICD-10-CM | POA: Diagnosis not present

## 2020-08-22 NOTE — Telephone Encounter (Signed)
Called primary contact; son answered.  Given PCP response & appt scheduled for Mon Aug 22.

## 2020-08-24 DIAGNOSIS — N401 Enlarged prostate with lower urinary tract symptoms: Secondary | ICD-10-CM | POA: Diagnosis not present

## 2020-08-24 DIAGNOSIS — R310 Gross hematuria: Secondary | ICD-10-CM | POA: Diagnosis not present

## 2020-08-24 DIAGNOSIS — I130 Hypertensive heart and chronic kidney disease with heart failure and stage 1 through stage 4 chronic kidney disease, or unspecified chronic kidney disease: Secondary | ICD-10-CM | POA: Diagnosis not present

## 2020-08-24 DIAGNOSIS — N179 Acute kidney failure, unspecified: Secondary | ICD-10-CM | POA: Diagnosis not present

## 2020-08-24 DIAGNOSIS — R338 Other retention of urine: Secondary | ICD-10-CM | POA: Diagnosis not present

## 2020-08-24 DIAGNOSIS — N210 Calculus in bladder: Secondary | ICD-10-CM | POA: Diagnosis not present

## 2020-08-24 DIAGNOSIS — U071 COVID-19: Secondary | ICD-10-CM | POA: Diagnosis not present

## 2020-08-24 DIAGNOSIS — J181 Lobar pneumonia, unspecified organism: Secondary | ICD-10-CM | POA: Diagnosis not present

## 2020-08-24 DIAGNOSIS — N1831 Chronic kidney disease, stage 3a: Secondary | ICD-10-CM | POA: Diagnosis not present

## 2020-08-24 DIAGNOSIS — N281 Cyst of kidney, acquired: Secondary | ICD-10-CM | POA: Diagnosis not present

## 2020-08-24 DIAGNOSIS — E1122 Type 2 diabetes mellitus with diabetic chronic kidney disease: Secondary | ICD-10-CM | POA: Diagnosis not present

## 2020-08-24 DIAGNOSIS — N323 Diverticulum of bladder: Secondary | ICD-10-CM | POA: Diagnosis not present

## 2020-08-24 DIAGNOSIS — I5023 Acute on chronic systolic (congestive) heart failure: Secondary | ICD-10-CM | POA: Diagnosis not present

## 2020-08-24 DIAGNOSIS — J1282 Pneumonia due to coronavirus disease 2019: Secondary | ICD-10-CM | POA: Diagnosis not present

## 2020-08-24 NOTE — Progress Notes (Deleted)
Cardiology Office Note   Date:  08/24/2020   ID:  Philip Kerr, DOB 1933-09-17, MRN [id number]  PCP:  Philip Borg, MD  Cardiologist:  Dr. Angelena Form    No chief complaint on file.    Post hospital History of Present Illness: Philip Kerr is a 87 y.o. male who presents for ***  with a hx of CAD s/p CABG x5V in 1996, chronic LBBB, ischemic CM EF in [id number] was [id number], systolic and diastolic heart failure, mitral regurgitation, AI, --Echo in in 2019 with EF 25-30% did not want ICD HTN, HLD, type 2 DM, CKD stage III, BPH, childhood left lobectomy high dose BB caused brady.  Presented to ER 07/22/20 for chest pain, EKG SB at 53, LBBB troponin flat and neg,  +COVID +PNA he did not want any tests.  Hospitalization complicated by acute metabolic encephalopathy and delirium, and AKI, nephrology following some NSVT toprol XL 12.5 added   By 08/08/20 he was improved, his BB was stopped due to 8 second pauses has WCT on 08/06/20 and placed on amiodarone - if BP increases add BiDil  discharged.  End of stay EF 45-50% There is  severe asymmetric left ventricular hypertrophy of the septal segment mod MR  aoRoot 40 mm  400 mg daily after 2 weeks on 400 BID amiodarone Diuretics on hold GDMT: intolerant to BB, not candidate for ARNI/ACEI/ARB/MRA/SGLT2I due to AKI , may add Bidil if BP requires later  Past Medical History:  Diagnosis Date   Aortic insufficiency    Coronary artery disease    coronary artery bypass graft x 5 in 1996   Diabetes mellitus    Hypercholesterolemia    non-insulin dependent   Hypertension    Ischemic cardiomyopathy    Mitral regurgitation    PVC (premature ventricular contraction)     Past Surgical History:  Procedure Laterality Date   CORONARY ARTERY BYPASS GRAFT     LUNG SURGERY     OTHER SURGICAL HISTORY     percutaneous coronary intervention of the  posterolateral segment on 01/27/1998     Current Outpatient Medications  Medication Sig Dispense Refill    albuterol (PROVENTIL HFA;VENTOLIN HFA) 108 (90 Base) MCG/ACT inhaler Inhale 2 puffs into the lungs every 6 (six) hours as needed for wheezing or shortness of breath. 1 Inhaler 11   amiodarone (PACERONE) 400 MG tablet Take 400 mg bid x 13 days then 200 mg daily 60 tablet 0   aspirin EC 81 MG tablet Take 81 mg by mouth every morning. Swallow whole.     Cholecalciferol (VITAMIN D3 PO) Take 1 capsule by mouth at bedtime.     oxyCODONE (OXY IR/ROXICODONE) 5 MG immediate release tablet Take 1 tablet (5 mg total) by mouth every 6 (six) hours as needed for up to 12 doses for breakthrough pain (hold for sedation). 12 tablet 0   rosuvastatin (CRESTOR) 20 MG tablet Take 1 tablet (20 mg total) by mouth daily. 90 tablet 3   tamsulosin (FLOMAX) 0.4 MG CAPS capsule TAKE 1 CAPSULE BY MOUTH EVERY DAY 90 capsule 1   tamsulosin (FLOMAX) 0.4 MG CAPS capsule Take 1 capsule (0.4 mg total) by mouth daily after supper. 30 capsule 0   traZODone (DESYREL) 100 MG tablet Take 1 tablet (100 mg total) by mouth at bedtime as needed for up to 7 days for sleep. 7 tablet 0   No current facility-administered medications for this visit.    Allergies:   Patient has no known  allergies.    Social History:  The patient  reports that he has never smoked. He has never used smokeless tobacco. He reports that he does not drink alcohol and does not use drugs.   Family History:  The patient's ***family history is not on file.    ROS:  General:no colds or fevers, no weight changes Skin:no rashes or ulcers HEENT:no blurred vision, no congestion CV:see HPI PUL:see HPI GI:no diarrhea constipation or melena, no indigestion GU:no hematuria, no dysuria MS:no joint pain, no claudication Neuro:no syncope, no lightheadedness Endo:no diabetes, no thyroid disease Wt Readings from Last 3 Encounters:  08/12/20 174 lb (78.9 kg)  08/07/20 176 lb 5.9 oz (80 kg)  07/22/20 155 lb (70.3 kg)     PHYSICAL EXAM: VS:  There were no vitals taken  for this visit. , BMI There is no height or weight on file to calculate BMI. General:Pleasant affect, NAD Skin:Warm and dry, brisk capillary refill HEENT:normocephalic, sclera clear, mucus membranes moist Neck:supple, no JVD, no bruits  Heart:S1S2 RRR without murmur, gallup, rub or click Lungs:clear without rales, rhonchi, or wheezes [id number], non tender, + BS, do not palpate liver spleen or masses Ext:no lower ext edema, 2+ pedal pulses, 2+ radial pulses Neuro:alert and oriented, MAE, follows commands, + facial symmetry    EKG:  EKG is ordered today. The ekg ordered today demonstrates ***   Recent Labs: 07/28/2020: ALT 32 07/31/2020: B Natriuretic Peptide 4,160.4 08/07/2020: Magnesium 1.7 08/12/2020: BUN 45; Creatinine, Ser 3.27; Hemoglobin 9.3; Platelets 312.0; Potassium 4.3; Sodium 137    Lipid Panel    Component Value Date/Time   CHOL 103 12/22/2019 1220   CHOL 98 (L) 12/01/2018 0808   TRIG 184.0 (H) 12/22/2019 1220   TRIG 102 10/22/2005 0730   HDL 47.40 12/22/2019 1220   HDL 41 12/01/2018 0808   CHOLHDL 2 12/22/2019 1220   VLDL 36.8 12/22/2019 1220   LDLCALC 18 12/22/2019 1220   LDLCALC 44 07/17/2019 1022       Other studies Reviewed: Additional studies/ records that were reviewed today include: ***. Echo from 07/31/20:    1. Left ventricular ejection fraction, by estimation, is 45 to 50%. The  left ventricle has mildly decreased function. Left ventricular endocardial  border not optimally defined to evaluate regional wall motion. There is  severe asymmetric left ventricular hypertrophy of the septal segment. Left ventricular diastolic parameters are indeterminate.   2. Right ventricular systolic function is normal. The right ventricular  size is normal. Tricuspid regurgitation signal is inadequate for assessing  PA pressure.   3. Left atrial size was mildly dilated.   4. The mitral valve is abnormal. Moderate mitral valve regurgitation. No  evidence of mitral  stenosis.   5. Tricuspid valve regurgitation is mild to moderate.   6. The aortic valve is tricuspid. There is mild calcification of the  aortic valve. There is mild thickening of the aortic valve.   7. Aortic dilatation noted. There is mild dilatation of the aortic root,  measuring 40 mm.    Echo from 09/11/2017:   - Left ventricle: The cavity size was moderately dilated. Wall    thickness was increased in a pattern of severe LVH. Systolic    function was severely reduced. The estimated ejection fraction    was in the range of 25% to 30%. Diffuse hypokinesis. Left    ventricular diastolic function parameters were normal.  - Aortic valve: There was mild regurgitation.  - Mitral valve: There was moderate regurgitation.  -  Left atrium: The atrium was moderately dilated.  - Atrial septum: No defect or patent foramen ovale was identified.     ASSESSMENT AND PLAN:  1.  ***   Current medicines are reviewed with the patient today.  The patient Has no concerns regarding medicines.  The following changes have been made:  See above Labs/ tests ordered today include:see above  Disposition:   FU:  see above  Signed, Cecilie Kicks, NP  08/24/2020 9:31 PM    [facility] [place], [place] [place], [place] Phone: [phone]; Fax: [phone]

## 2020-08-25 ENCOUNTER — Ambulatory Visit: Payer: Medicare HMO | Admitting: Cardiology

## 2020-08-26 DIAGNOSIS — U071 COVID-19: Secondary | ICD-10-CM | POA: Diagnosis not present

## 2020-08-26 DIAGNOSIS — J1282 Pneumonia due to coronavirus disease 2019: Secondary | ICD-10-CM | POA: Diagnosis not present

## 2020-08-26 DIAGNOSIS — E1122 Type 2 diabetes mellitus with diabetic chronic kidney disease: Secondary | ICD-10-CM | POA: Diagnosis not present

## 2020-08-26 DIAGNOSIS — I5023 Acute on chronic systolic (congestive) heart failure: Secondary | ICD-10-CM | POA: Diagnosis not present

## 2020-08-26 DIAGNOSIS — N1831 Chronic kidney disease, stage 3a: Secondary | ICD-10-CM | POA: Diagnosis not present

## 2020-08-26 DIAGNOSIS — N179 Acute kidney failure, unspecified: Secondary | ICD-10-CM | POA: Diagnosis not present

## 2020-08-26 DIAGNOSIS — J181 Lobar pneumonia, unspecified organism: Secondary | ICD-10-CM | POA: Diagnosis not present

## 2020-08-26 DIAGNOSIS — N401 Enlarged prostate with lower urinary tract symptoms: Secondary | ICD-10-CM | POA: Diagnosis not present

## 2020-08-26 DIAGNOSIS — I130 Hypertensive heart and chronic kidney disease with heart failure and stage 1 through stage 4 chronic kidney disease, or unspecified chronic kidney disease: Secondary | ICD-10-CM | POA: Diagnosis not present

## 2020-08-29 ENCOUNTER — Other Ambulatory Visit: Payer: Self-pay

## 2020-08-29 ENCOUNTER — Encounter: Payer: Self-pay | Admitting: Internal Medicine

## 2020-08-29 ENCOUNTER — Ambulatory Visit (INDEPENDENT_AMBULATORY_CARE_PROVIDER_SITE_OTHER): Payer: Medicare HMO | Admitting: Internal Medicine

## 2020-08-29 VITALS — BP 140/50 | HR 71 | Temp 98.1°F | Ht 70.0 in | Wt 166.4 lb

## 2020-08-29 DIAGNOSIS — U071 COVID-19: Secondary | ICD-10-CM | POA: Diagnosis not present

## 2020-08-29 DIAGNOSIS — I10 Essential (primary) hypertension: Secondary | ICD-10-CM | POA: Diagnosis not present

## 2020-08-29 DIAGNOSIS — I5022 Chronic systolic (congestive) heart failure: Secondary | ICD-10-CM

## 2020-08-29 DIAGNOSIS — N1831 Chronic kidney disease, stage 3a: Secondary | ICD-10-CM | POA: Diagnosis not present

## 2020-08-29 DIAGNOSIS — N401 Enlarged prostate with lower urinary tract symptoms: Secondary | ICD-10-CM | POA: Diagnosis not present

## 2020-08-29 DIAGNOSIS — E1122 Type 2 diabetes mellitus with diabetic chronic kidney disease: Secondary | ICD-10-CM | POA: Diagnosis not present

## 2020-08-29 DIAGNOSIS — E119 Type 2 diabetes mellitus without complications: Secondary | ICD-10-CM | POA: Diagnosis not present

## 2020-08-29 DIAGNOSIS — N179 Acute kidney failure, unspecified: Secondary | ICD-10-CM | POA: Diagnosis not present

## 2020-08-29 DIAGNOSIS — E559 Vitamin D deficiency, unspecified: Secondary | ICD-10-CM | POA: Diagnosis not present

## 2020-08-29 DIAGNOSIS — J1282 Pneumonia due to coronavirus disease 2019: Secondary | ICD-10-CM | POA: Diagnosis not present

## 2020-08-29 DIAGNOSIS — I130 Hypertensive heart and chronic kidney disease with heart failure and stage 1 through stage 4 chronic kidney disease, or unspecified chronic kidney disease: Secondary | ICD-10-CM | POA: Diagnosis not present

## 2020-08-29 DIAGNOSIS — J181 Lobar pneumonia, unspecified organism: Secondary | ICD-10-CM | POA: Diagnosis not present

## 2020-08-29 DIAGNOSIS — I5023 Acute on chronic systolic (congestive) heart failure: Secondary | ICD-10-CM | POA: Diagnosis not present

## 2020-08-29 MED ORDER — ALBUTEROL SULFATE HFA 108 (90 BASE) MCG/ACT IN AERS: 2.0000 | INHALATION_SPRAY | Freq: Four times a day (QID) | RESPIRATORY_TRACT | 11 refills | Status: DC | PRN

## 2020-08-29 NOTE — Progress Notes (Signed)
Patient ID: Philip Kerr, male   DOB: 11-21-1933, 87 y.o.   MRN: [id number]        Chief Complaint: follow up BP concerns with son       HPI:  Philip Kerr is a 87 y.o. male here with above; pt has had several low BP at home with dizziness, but in the past 2-3 days seems to have turned the corner it seems now about 3 wks post last hospital d/c with imrproved BP, no further dizziness, and Pt denies chest pain, increased sob or doe, wheezing, orthopnea, PND, increased LE swelling, palpitations, dizziness or syncope.  Pt remains off the amloidpine 5 mg.  BP has been somewhat variable at home between sbp 120-150 but more on the lower side, so he is pleased it seems.   Pt denies fever, wt loss, night sweats, loss of appetite, or other constitutional symptoms   Pt denies polydipsia, polyuria, or new focal neuro s/s.  No other complaints.   Taking Vit D Wt Readings from Last 3 Encounters:  08/29/20 166 lb 6.4 oz (75.5 kg)  08/12/20 174 lb (78.9 kg)  08/07/20 176 lb 5.9 oz (80 kg)   BP Readings from Last 3 Encounters:  08/29/20 (!) 140/50  08/12/20 122/66  08/08/20 (!) 130/56         Past Medical History:  Diagnosis Date   Aortic insufficiency    Coronary artery disease    coronary artery bypass graft x 5 in 1996   Diabetes mellitus    Hypercholesterolemia    non-insulin dependent   Hypertension    Ischemic cardiomyopathy    Mitral regurgitation    PVC (premature ventricular contraction)    Past Surgical History:  Procedure Laterality Date   CORONARY ARTERY BYPASS GRAFT     LUNG SURGERY     OTHER SURGICAL HISTORY     percutaneous coronary intervention of the  posterolateral segment on 01/27/1998    reports that he has never smoked. He has never used smokeless tobacco. He reports that he does not drink alcohol and does not use drugs. family history is not on file. No Known Allergies Current Outpatient Medications on File Prior to Visit  Medication Sig Dispense Refill    amiodarone (PACERONE) 400 MG tablet Take 400 mg bid x 13 days then 200 mg daily 60 tablet 0   aspirin EC 81 MG tablet Take 81 mg by mouth every morning. Swallow whole.     Cholecalciferol (VITAMIN D3 PO) Take 1 capsule by mouth at bedtime.     rosuvastatin (CRESTOR) 20 MG tablet Take 1 tablet (20 mg total) by mouth daily. 90 tablet 3   tamsulosin (FLOMAX) 0.4 MG CAPS capsule Take 1 capsule (0.4 mg total) by mouth daily after supper. 30 capsule 0   traZODone (DESYREL) 100 MG tablet Take 1 tablet (100 mg total) by mouth at bedtime as needed for up to 7 days for sleep. 7 tablet 0   No current facility-administered medications on file prior to visit.        ROS:  All others reviewed and negative.  Objective        PE:  BP (!) 140/50 (BP Location: Right Arm, Patient Position: Sitting, Cuff Size: Normal)   Pulse 71   Temp 98.1 F (36.7 C) (Oral)   Ht '5\' 10"'$  (1.778 m)   Wt 166 lb 6.4 oz (75.5 kg)   SpO2 98%   BMI 23.88 kg/m  Constitutional: Pt appears in NAD               HENT: Head: NCAT.                Right Ear: External ear normal.                 Left Ear: External ear normal.                Eyes: . Pupils are equal, round, and reactive to light. Conjunctivae and EOM are normal               Nose: without d/c or deformity               Neck: Neck supple. Gross normal ROM               Cardiovascular: Normal rate and regular rhythm.                 Pulmonary/Chest: Effort normal and breath sounds without rales or wheezing.                Abd:  Soft, NT, ND, + BS, no organomegaly               Neurological: Pt is alert. At baseline orientation, motor grossly intact               Skin: Skin is warm. No rashes, no other new lesions, LE edema - none               Psychiatric: Pt behavior is normal without agitation   Micro: none  Cardiac tracings I have personally interpreted today:  none  Pertinent Radiological findings (summarize): none   Lab Results  Component  Value Date   WBC 7.7 08/12/2020   HGB 9.3 (L) 08/12/2020   HCT 28.3 (L) 08/12/2020   PLT 312.0 08/12/2020   GLUCOSE 141 (H) 08/12/2020   CHOL 103 12/22/2019   TRIG 184.0 (H) 12/22/2019   HDL 47.40 12/22/2019   LDLCALC 18 12/22/2019   ALT 32 07/28/2020   AST 19 07/28/2020   NA 137 08/12/2020   K 4.3 08/12/2020   CL 102 08/12/2020   CREATININE 3.27 (H) 08/12/2020   BUN 45 (H) 08/12/2020   CO2 28 08/12/2020   TSH 3.00 07/17/2019   PSA 1.97 09/06/2017   HGBA1C 6.4 (H) 07/23/2020   MICROALBUR 4.2 07/17/2019   Assessment/Plan:  Philip Kerr is a 87 y.o. White or Caucasian [1] male with  has a past medical history of Aortic insufficiency, Coronary artery disease, Diabetes mellitus, Hypercholesterolemia, Hypertension, Ischemic cardiomyopathy, Mitral regurgitation, and PVC (premature ventricular contraction).  Vitamin D deficiency Last vitamin D Lab Results  Component Value Date   VD25OH 65 07/17/2019   Stable, cont oral replacement   Essential hypertension BP Readings from Last 3 Encounters:  08/29/20 (!) 140/50  08/12/20 122/66  08/08/20 (!) 130/56   Mild uncontrolled here, pt declines further tx such as restart amlodipine at 2.5 mg., cont to monitor at home and next visit   Diabetes mellitus type II, non insulin dependent ([place]) Lab Results  Component Value Date   HGBA1C 6.4 (H) 07/23/2020   Stable, pt to continue current medical treatment  - diet   Chronic kidney disease, stage 3a ([place]) Lab Results  Component Value Date   CREATININE 3.27 (H) 08/12/2020   Stable overall, cont to avoid nephrotoxins   Chronic systolic CHF (congestive heart failure) (HCC) Volume stable,  cont current med tx,  to f/u any worsening symptoms or concerns  Followup: Return in about 6 months (around 03/01/2021).  Philip Cower, MD 08/29/2020 8:49 PM [facility]

## 2020-08-29 NOTE — Assessment & Plan Note (Signed)
Lab Results  Component Value Date   CREATININE 3.27 (H) 08/12/2020   Stable overall, cont to avoid nephrotoxins

## 2020-08-29 NOTE — Patient Instructions (Addendum)
Please continue all other medications as before, and refills have been done if requested - the inhaler  Please have the pharmacy call with any other refills you may need.  Please continue your efforts at being more active, low cholesterol diet, and weight control.  Please keep your appointments with your specialists as you may have planned  Please make an Appointment to return in 6 months, or sooner if needed

## 2020-08-29 NOTE — Assessment & Plan Note (Signed)
Lab Results  Component Value Date   HGBA1C 6.4 (H) 07/23/2020   Stable, pt to continue current medical treatment  - diet

## 2020-08-29 NOTE — Assessment & Plan Note (Signed)
BP Readings from Last 3 Encounters:  08/29/20 (!) 140/50  08/12/20 122/66  08/08/20 (!) 130/56   Mild uncontrolled here, pt declines further tx such as restart amlodipine at 2.5 mg., cont to monitor at home and next visit

## 2020-08-29 NOTE — Assessment & Plan Note (Signed)
Last vitamin D Lab Results  Component Value Date   VD25OH 49 07/17/2019   Stable, cont oral replacement

## 2020-08-29 NOTE — Assessment & Plan Note (Signed)
Volume stable, cont current med tx,  to f/u any worsening symptoms or concerns

## 2020-08-31 DIAGNOSIS — J181 Lobar pneumonia, unspecified organism: Secondary | ICD-10-CM | POA: Diagnosis not present

## 2020-08-31 DIAGNOSIS — U071 COVID-19: Secondary | ICD-10-CM | POA: Diagnosis not present

## 2020-08-31 DIAGNOSIS — I130 Hypertensive heart and chronic kidney disease with heart failure and stage 1 through stage 4 chronic kidney disease, or unspecified chronic kidney disease: Secondary | ICD-10-CM | POA: Diagnosis not present

## 2020-08-31 DIAGNOSIS — N401 Enlarged prostate with lower urinary tract symptoms: Secondary | ICD-10-CM | POA: Diagnosis not present

## 2020-08-31 DIAGNOSIS — N1831 Chronic kidney disease, stage 3a: Secondary | ICD-10-CM | POA: Diagnosis not present

## 2020-08-31 DIAGNOSIS — N179 Acute kidney failure, unspecified: Secondary | ICD-10-CM | POA: Diagnosis not present

## 2020-08-31 DIAGNOSIS — J1282 Pneumonia due to coronavirus disease 2019: Secondary | ICD-10-CM | POA: Diagnosis not present

## 2020-08-31 DIAGNOSIS — E1122 Type 2 diabetes mellitus with diabetic chronic kidney disease: Secondary | ICD-10-CM | POA: Diagnosis not present

## 2020-08-31 DIAGNOSIS — I5023 Acute on chronic systolic (congestive) heart failure: Secondary | ICD-10-CM | POA: Diagnosis not present

## 2020-09-01 ENCOUNTER — Encounter (HOSPITAL_BASED_OUTPATIENT_CLINIC_OR_DEPARTMENT_OTHER): Payer: Self-pay | Admitting: Family

## 2020-09-01 ENCOUNTER — Telehealth: Payer: Self-pay | Admitting: Internal Medicine

## 2020-09-01 ENCOUNTER — Other Ambulatory Visit: Payer: Self-pay | Admitting: Internal Medicine

## 2020-09-01 ENCOUNTER — Other Ambulatory Visit: Payer: Self-pay

## 2020-09-01 ENCOUNTER — Ambulatory Visit (HOSPITAL_BASED_OUTPATIENT_CLINIC_OR_DEPARTMENT_OTHER): Payer: Medicare HMO | Admitting: Family

## 2020-09-01 VITALS — BP 130/60 | HR 61 | Ht 70.0 in | Wt 166.4 lb

## 2020-09-01 DIAGNOSIS — Z79899 Other long term (current) drug therapy: Secondary | ICD-10-CM | POA: Diagnosis not present

## 2020-09-01 DIAGNOSIS — I10 Essential (primary) hypertension: Secondary | ICD-10-CM

## 2020-09-01 DIAGNOSIS — I472 Ventricular tachycardia: Secondary | ICD-10-CM

## 2020-09-01 DIAGNOSIS — I255 Ischemic cardiomyopathy: Secondary | ICD-10-CM | POA: Diagnosis not present

## 2020-09-01 DIAGNOSIS — R000 Tachycardia, unspecified: Secondary | ICD-10-CM

## 2020-09-01 DIAGNOSIS — I4729 Other ventricular tachycardia: Secondary | ICD-10-CM

## 2020-09-01 MED ORDER — ROSUVASTATIN CALCIUM 20 MG PO TABS: 20.0000 mg | ORAL_TABLET | Freq: Every day | ORAL | 3 refills | Status: DC

## 2020-09-01 MED ORDER — AMIODARONE HCL 200 MG PO TABS: 200.0000 mg | ORAL_TABLET | Freq: Every day | ORAL | 5 refills | Status: DC

## 2020-09-01 NOTE — Patient Instructions (Signed)
Medication Instructions:  Your physician has recommended you make the following change in your medication:   CONTINUE Amiodarone '200mg'$  one tablet daily  If you continue to have decreased appetite or feeling nauseous - please call our office and we will reduce your dose of Amiodarone. Philip Dubonnet, Philip Kerr will call you in 2 weeks to check in.  *If you need a refill on your cardiac medications before your next appointment, please call your pharmacy*   Lab Work: Your physician recommends that you return for lab work in 3 weeks at [facility] for [place] and TSH. You do not need to be fasting.   If you have labs (blood work) drawn today and your tests are completely normal, you will receive your results only by: [place] (if you have MyChart) OR A paper copy in the mail If you have any lab test that is abnormal or we need to change your treatment, we will call you to review the results.   Testing/Procedures: None ordered today.    Follow-Up: At [facility], you and your health needs are our priority.  As part of our continuing mission to provide you with exceptional heart care, we have created designated Provider Care Teams.  These Care Teams include your primary Cardiologist (physician) and Advanced Practice Providers (APPs -  Physician Assistants and Nurse Practitioners) who all work together to provide you with the care you need, when you need it.  We recommend signing up for the patient portal called "MyChart".  Sign up information is provided on this After Visit Summary.  MyChart is used to connect with patients for Virtual Visits (Telemedicine).  Patients are able to view lab/test results, encounter notes, upcoming appointments, etc.  Non-urgent messages can be sent to your provider as well.   To learn more about what you can do with MyChart, go to [email or web address].    Your next appointment:   2 month(s) in person with Philip Dubonnet, Philip Kerr AND In 6 months with Philip Kerr   Other Instructions  Heart Healthy Diet Recommendations: A low-salt diet is recommended. Meats should be grilled, baked, or boiled. Avoid fried foods. Focus on lean protein sources like fish or chicken with vegetables and fruits. The American Heart Association is a Microbiologist!   Exercise recommendations: The American Heart Association recommends 150 minutes of moderate intensity exercise weekly. Try 30 minutes of moderate intensity exercise 4-5 times per week. This could include walking, jogging, or swimming.

## 2020-09-01 NOTE — Telephone Encounter (Signed)
1.Medication Requested: oxyCODONE (Oxy IR/ROXICODONE) immediate release tablet 5 mg tamsulosin (FLOMAX) 0.4 MG CAPS capsule oxybutynin (DITROPAN) tablet 5 mg amiodarone (PACERONE) tablet 200 mg   2. Pharmacy (Name, Street, [place]): [id number] [place], [place] - [address]  Phone:  [phone]:  [phone]  3. On Med List: yes  4. Last Visit with PCP: 08.22.22  5. Next visit date with PCP: n/a   Agent: Please be advised that RX refills may take up to 3 business days. We ask that you follow-up with your pharmacy.

## 2020-09-01 NOTE — Telephone Encounter (Signed)
Please refill as per office routine med refill policy (all routine meds refilled for 3 mo or monthly per pt preference up to one year from last visit, then month to month grace period for 3 mo, then further med refills will have to be denied)  

## 2020-09-01 NOTE — Progress Notes (Signed)
Office Visit    Patient Name: Philip Kerr Date of Encounter: 09/02/2020  PCP:  Biagio Borg, MD   [place]  Cardiologist:  Lauree Chandler, MD  Advanced Practice Provider:  No care team member to display Electrophysiologist:  None      Chief Complaint    Philip Kerr is a 87 y.o. male with a hx of CAD s/p CABG, ICM, left bundle branch block, diabetes, CKD 3, hypertension, COVID-19 pneumonia presents today for hospital follow up   Past Medical History    Past Medical History:  Diagnosis Date   Aortic insufficiency    Coronary artery disease    coronary artery bypass graft x 5 in 1996   Diabetes mellitus    Hypercholesterolemia    non-insulin dependent   Hypertension    Ischemic cardiomyopathy    Mitral regurgitation    PVC (premature ventricular contraction)    Past Surgical History:  Procedure Laterality Date   CORONARY ARTERY BYPASS GRAFT     LUNG SURGERY     OTHER SURGICAL HISTORY     percutaneous coronary intervention of the  posterolateral segment on 01/27/1998    Allergies  No Known Allergies  History of Present Illness    Philip Kerr is a 87 y.o. male with a hx of CAD s/p CABG, ICM, left bundle branch block, diabetes, CKD 3, hypertension, COVID-19 pneumonia  last seen while hospitalized.  He was admitted 07/22/2020 - 08/08/2020.  On admission he had minimal and stable elevated troponin without EKG changes and was sent home but immediately worsened due to chest pain with weakness and cough found to have transaminitis, possible bacterial pneumonia, COVID-19 positive.  Hospital course complicated by severe delirium which resolved prior to discharge, acute urinary retention requiring Foley, hematuria requiring CBI. Non sustained episodes of V. tach and placed on beta-blocker with subsequent 8-second pauses and WCT. He was recommended for Amidarone '400mg'$  BID x 14 days then '400mg'$  QD by cardiology but was discharged on '400mg'$   BID x13 days then 277mQD.  Echocardiogram during admission showed LVEF improved to 45-50%, severe LVH, moderate MR.  He presents today for follow-up.  Does note feeling like he "has the heaves" nausea and poor appetite since discharge. This has just started improving over the last 3-4 days.  Of note this has been since his amiodarone dose has been reduced. Reports an occasional episode of his "heart feels funny" which sounds similar to palpitations.  Tells me this has happened for some time.  The episodes last no longer than 5 minutes and self resolved.  No chest pain, pressure, tightness.  His wife shares with me that he "lays around all day" and is rather tired since discharge. Reports no shortness of breath, edema, PND, orthopnea. No lightheadedness, dizziness, near syncope, syncope.   EKGs/Labs/Other Studies Reviewed:   The following studies were reviewed today: Echo from 07/31/20:    1. Left ventricular ejection fraction, by estimation, is 45 to 50%. The  left ventricle has mildly decreased function. Left ventricular endocardial  border not optimally defined to evaluate regional wall motion. There is  severe asymmetric left ventricular hypertrophy of the septal segment. Left ventricular diastolic parameters are indeterminate.   2. Right ventricular systolic function is normal. The right ventricular  size is normal. Tricuspid regurgitation signal is inadequate for assessing  PA pressure.   3. Left atrial size was mildly dilated.   4. The mitral valve is abnormal. Moderate mitral  valve regurgitation. No  evidence of mitral stenosis.   5. Tricuspid valve regurgitation is mild to moderate.   6. The aortic valve is tricuspid. There is mild calcification of the  aortic valve. There is mild thickening of the aortic valve.   7. Aortic dilatation noted. There is mild dilatation of the aortic root,  measuring 40 mm.    Echo from 09/11/2017:   - Left ventricle: The cavity size was moderately  dilated. Wall    thickness was increased in a pattern of severe LVH. Systolic    function was severely reduced. The estimated ejection fraction    was in the range of 25% to 30%. Diffuse hypokinesis. Left    ventricular diastolic function parameters were normal.  - Aortic valve: There was mild regurgitation.  - Mitral valve: There was moderate regurgitation.  - Left atrium: The atrium was moderately dilated.  - Atrial septum: No defect or patent foramen ovale was identified.   EKG:  No EKG today.  Recent Labs: 07/28/2020: ALT 32 07/31/2020: B Natriuretic Peptide 4,160.4 08/07/2020: Magnesium 1.7 08/12/2020: BUN 45; Creatinine, Ser 3.27; Hemoglobin 9.3; Platelets 312.0; Potassium 4.3; Sodium 137  Recent Lipid Panel    Component Value Date/Time   CHOL 103 12/22/2019 1220   CHOL 98 (L) 12/01/2018 0808   TRIG 184.0 (H) 12/22/2019 1220   TRIG 102 10/22/2005 0730   HDL 47.40 12/22/2019 1220   HDL 41 12/01/2018 0808   CHOLHDL 2 12/22/2019 1220   VLDL 36.8 12/22/2019 1220   LDLCALC 18 12/22/2019 1220   LDLCALC 44 07/17/2019 1022    Home Medications   Current Meds  Medication Sig   aspirin EC 81 MG tablet Take 81 mg by mouth every morning. Swallow whole.   Cholecalciferol (VITAMIN D3 PO) Take 1 capsule by mouth at bedtime.   tamsulosin (FLOMAX) 0.4 MG CAPS capsule Take 1 capsule (0.4 mg total) by mouth daily after supper.   traZODone (DESYREL) 100 MG tablet Take 1 tablet (100 mg total) by mouth at bedtime as needed for up to 7 days for sleep.   vitamin E 180 MG (400 UNITS) capsule Take 400 Units by mouth daily.   [DISCONTINUED] albuterol (VENTOLIN HFA) 108 (90 Base) MCG/ACT inhaler Inhale 2 puffs into the lungs every 6 (six) hours as needed for wheezing or shortness of breath.   [DISCONTINUED] amiodarone (PACERONE) 200 MG tablet Take 200 mg by mouth daily.   [DISCONTINUED] amiodarone (PACERONE) 400 MG tablet Take 400 mg bid x 13 days then 200 mg daily   [DISCONTINUED] rosuvastatin  (CRESTOR) 20 MG tablet Take 1 tablet (20 mg total) by mouth daily.     Review of Systems      All other systems reviewed and are otherwise negative except as noted above.  Physical Exam    VS:  BP 130/60   Pulse 61   Ht '5\' 10"'$  (1.778 m)   Wt 166 lb 6.4 oz (75.5 kg)   SpO2 98%   BMI 23.88 kg/m  , BMI Body mass index is 23.88 kg/m.  Wt Readings from Last 3 Encounters:  09/01/20 166 lb 6.4 oz (75.5 kg)  08/29/20 166 lb 6.4 oz (75.5 kg)  08/12/20 174 lb (78.9 kg)     GEN: Well nourished, well developed, in no acute distress. HEENT: normal. Neck: Supple, no JVD, carotid bruits, or masses. Cardiac: RRR, no murmurs, rubs, or gallops. No clubbing, cyanosis, edema.  Radials/PT 2+ and equal bilaterally.  Respiratory:  Respirations regular and unlabored,  clear to auscultation bilaterally. GI: Soft, nontender, nondistended. MS: No deformity or atrophy. Skin: Warm and dry, no rash. Neuro:  Strength and sensation are intact. Psych: Normal affect.  Assessment & Plan    Wide-complex tachycardia while admitted 08/06/2020 which was asymptomatic / NST / High grade AV block with discontinuation of BB / Amiodarone / LBBB - Chronic LBBB. Previously on beta-blocker for GDMT for CHF and NSVT.  However had to be discontinued during recent admission due to pauses of greater than 8 seconds of high-grade AV block along with WCT.  He was started on amiodarone in the hospital with 400 twice daily for 14 days then 200 daily.  Notes nausea, poor appetite since hospital discharge.  This is improved over the last 3 days.  Of note this is since his amiodarone dose has been reduced.  As such we will continue 200 mg daily though it would not be optimal dose of '400mg'$ . However, concern for side effect of Amiodarone with GI upset. He is agreeable ot continue '200mg'$  QD and will report recurrent GI upset at which time Amiodarone could be reduced vs discontinued. Orders for TSH and CMP at [facility] in 3 weeks as he will have  completed 6 weeks of therapy.  CAD remote CABG - Stable with no anginal symptoms. No indication for ischemic evaluation. Patient prefers conservative approach. GDMT includes aspirin, rosuvastatin.  No beta-blocker due to previous pause, WCT.  HFrEF - 2019 LVEF 25%. 07/31/20 LVEF 45-50%, moderate MR, mild to moderate TR. Intolerant to BB. No ACE/ARB/ARNI/MRA due to renal dysfunction. Euvolemic and well compensated on exam. Low salt diet and fluid restriction <2 liters encouraged. Heart healthy diet and regular cardiovascular exercise encouraged.    CKD - Careful titration of diuretic and antihypertensive.    HTN - Not requiring antihypertensive therapy at this time. Bidil could be considered if clinically indicated.   COVID 19 pneumonia - Recent admission. No recurrent shortness of breath. Still with some deconditioning.   DM2 - Continue to follow with PCP.    Disposition: Follow up in 2 months with Loel Dubonnet, NP and in 6 months with Dr. Angelena Form.  Signed, Loel Dubonnet, NP 09/02/2020, 8:35 PM [place]

## 2020-09-02 ENCOUNTER — Encounter (HOSPITAL_BASED_OUTPATIENT_CLINIC_OR_DEPARTMENT_OTHER): Payer: Self-pay | Admitting: Family

## 2020-09-02 NOTE — Telephone Encounter (Signed)
[place] for refills done for oxybutinin and flomax  I would hold on oxycodone refill for now  Pt should ask for refils Pacerone through cardiology

## 2020-09-06 ENCOUNTER — Telehealth: Payer: Self-pay | Admitting: Internal Medicine

## 2020-09-07 DIAGNOSIS — I5023 Acute on chronic systolic (congestive) heart failure: Secondary | ICD-10-CM | POA: Diagnosis not present

## 2020-09-07 DIAGNOSIS — U071 COVID-19: Secondary | ICD-10-CM | POA: Diagnosis not present

## 2020-09-07 DIAGNOSIS — J181 Lobar pneumonia, unspecified organism: Secondary | ICD-10-CM | POA: Diagnosis not present

## 2020-09-07 DIAGNOSIS — N401 Enlarged prostate with lower urinary tract symptoms: Secondary | ICD-10-CM | POA: Diagnosis not present

## 2020-09-07 DIAGNOSIS — E1122 Type 2 diabetes mellitus with diabetic chronic kidney disease: Secondary | ICD-10-CM | POA: Diagnosis not present

## 2020-09-07 DIAGNOSIS — J1282 Pneumonia due to coronavirus disease 2019: Secondary | ICD-10-CM | POA: Diagnosis not present

## 2020-09-07 DIAGNOSIS — N179 Acute kidney failure, unspecified: Secondary | ICD-10-CM | POA: Diagnosis not present

## 2020-09-07 DIAGNOSIS — N1831 Chronic kidney disease, stage 3a: Secondary | ICD-10-CM | POA: Diagnosis not present

## 2020-09-07 DIAGNOSIS — I130 Hypertensive heart and chronic kidney disease with heart failure and stage 1 through stage 4 chronic kidney disease, or unspecified chronic kidney disease: Secondary | ICD-10-CM | POA: Diagnosis not present

## 2020-09-08 ENCOUNTER — Other Ambulatory Visit: Payer: Self-pay | Admitting: Internal Medicine

## 2020-09-08 NOTE — Telephone Encounter (Signed)
Refills sent to pharmacy 09/02/20

## 2020-09-14 DIAGNOSIS — J181 Lobar pneumonia, unspecified organism: Secondary | ICD-10-CM | POA: Diagnosis not present

## 2020-09-14 DIAGNOSIS — J1282 Pneumonia due to coronavirus disease 2019: Secondary | ICD-10-CM | POA: Diagnosis not present

## 2020-09-14 DIAGNOSIS — I130 Hypertensive heart and chronic kidney disease with heart failure and stage 1 through stage 4 chronic kidney disease, or unspecified chronic kidney disease: Secondary | ICD-10-CM | POA: Diagnosis not present

## 2020-09-14 DIAGNOSIS — I5023 Acute on chronic systolic (congestive) heart failure: Secondary | ICD-10-CM | POA: Diagnosis not present

## 2020-09-14 DIAGNOSIS — N1831 Chronic kidney disease, stage 3a: Secondary | ICD-10-CM | POA: Diagnosis not present

## 2020-09-14 DIAGNOSIS — U071 COVID-19: Secondary | ICD-10-CM | POA: Diagnosis not present

## 2020-09-14 DIAGNOSIS — E1122 Type 2 diabetes mellitus with diabetic chronic kidney disease: Secondary | ICD-10-CM | POA: Diagnosis not present

## 2020-09-14 DIAGNOSIS — N401 Enlarged prostate with lower urinary tract symptoms: Secondary | ICD-10-CM | POA: Diagnosis not present

## 2020-09-14 DIAGNOSIS — N179 Acute kidney failure, unspecified: Secondary | ICD-10-CM | POA: Diagnosis not present

## 2020-09-19 DIAGNOSIS — R339 Retention of urine, unspecified: Secondary | ICD-10-CM | POA: Diagnosis not present

## 2020-09-19 DIAGNOSIS — N1832 Chronic kidney disease, stage 3b: Secondary | ICD-10-CM | POA: Diagnosis not present

## 2020-09-19 DIAGNOSIS — I129 Hypertensive chronic kidney disease with stage 1 through stage 4 chronic kidney disease, or unspecified chronic kidney disease: Secondary | ICD-10-CM | POA: Diagnosis not present

## 2020-09-19 DIAGNOSIS — N189 Chronic kidney disease, unspecified: Secondary | ICD-10-CM | POA: Diagnosis not present

## 2020-09-19 DIAGNOSIS — N179 Acute kidney failure, unspecified: Secondary | ICD-10-CM | POA: Diagnosis not present

## 2020-09-19 DIAGNOSIS — I5023 Acute on chronic systolic (congestive) heart failure: Secondary | ICD-10-CM | POA: Diagnosis not present

## 2020-09-21 DIAGNOSIS — N401 Enlarged prostate with lower urinary tract symptoms: Secondary | ICD-10-CM | POA: Diagnosis not present

## 2020-09-21 DIAGNOSIS — J181 Lobar pneumonia, unspecified organism: Secondary | ICD-10-CM | POA: Diagnosis not present

## 2020-09-21 DIAGNOSIS — I5023 Acute on chronic systolic (congestive) heart failure: Secondary | ICD-10-CM | POA: Diagnosis not present

## 2020-09-21 DIAGNOSIS — N1831 Chronic kidney disease, stage 3a: Secondary | ICD-10-CM | POA: Diagnosis not present

## 2020-09-21 DIAGNOSIS — I130 Hypertensive heart and chronic kidney disease with heart failure and stage 1 through stage 4 chronic kidney disease, or unspecified chronic kidney disease: Secondary | ICD-10-CM | POA: Diagnosis not present

## 2020-09-21 DIAGNOSIS — U071 COVID-19: Secondary | ICD-10-CM | POA: Diagnosis not present

## 2020-09-21 DIAGNOSIS — J1282 Pneumonia due to coronavirus disease 2019: Secondary | ICD-10-CM | POA: Diagnosis not present

## 2020-09-21 DIAGNOSIS — E1122 Type 2 diabetes mellitus with diabetic chronic kidney disease: Secondary | ICD-10-CM | POA: Diagnosis not present

## 2020-09-21 DIAGNOSIS — N179 Acute kidney failure, unspecified: Secondary | ICD-10-CM | POA: Diagnosis not present

## 2020-09-27 DIAGNOSIS — N210 Calculus in bladder: Secondary | ICD-10-CM | POA: Diagnosis not present

## 2020-09-27 DIAGNOSIS — N323 Diverticulum of bladder: Secondary | ICD-10-CM | POA: Diagnosis not present

## 2020-09-27 DIAGNOSIS — N401 Enlarged prostate with lower urinary tract symptoms: Secondary | ICD-10-CM | POA: Diagnosis not present

## 2020-09-27 DIAGNOSIS — R338 Other retention of urine: Secondary | ICD-10-CM | POA: Diagnosis not present

## 2020-10-27 DIAGNOSIS — N210 Calculus in bladder: Secondary | ICD-10-CM | POA: Diagnosis not present

## 2020-10-27 DIAGNOSIS — R825 Elevated urine levels of drugs, medicaments and biological substances: Secondary | ICD-10-CM | POA: Diagnosis not present

## 2020-10-27 DIAGNOSIS — N401 Enlarged prostate with lower urinary tract symptoms: Secondary | ICD-10-CM | POA: Diagnosis not present

## 2020-10-27 DIAGNOSIS — R338 Other retention of urine: Secondary | ICD-10-CM | POA: Diagnosis not present

## 2020-11-01 ENCOUNTER — Ambulatory Visit (HOSPITAL_BASED_OUTPATIENT_CLINIC_OR_DEPARTMENT_OTHER): Payer: Medicare HMO | Admitting: Family

## 2020-11-01 ENCOUNTER — Other Ambulatory Visit: Payer: Self-pay

## 2020-11-01 ENCOUNTER — Encounter (HOSPITAL_BASED_OUTPATIENT_CLINIC_OR_DEPARTMENT_OTHER): Payer: Self-pay | Admitting: Family

## 2020-11-01 VITALS — BP 140/54 | HR 53 | Ht 70.0 in | Wt 166.8 lb

## 2020-11-01 DIAGNOSIS — I25118 Atherosclerotic heart disease of native coronary artery with other forms of angina pectoris: Secondary | ICD-10-CM

## 2020-11-01 DIAGNOSIS — I10 Essential (primary) hypertension: Secondary | ICD-10-CM | POA: Diagnosis not present

## 2020-11-01 DIAGNOSIS — I5022 Chronic systolic (congestive) heart failure: Secondary | ICD-10-CM | POA: Diagnosis not present

## 2020-11-01 DIAGNOSIS — Z79899 Other long term (current) drug therapy: Secondary | ICD-10-CM

## 2020-11-01 DIAGNOSIS — R946 Abnormal results of thyroid function studies: Secondary | ICD-10-CM | POA: Diagnosis not present

## 2020-11-01 NOTE — Patient Instructions (Addendum)
Medication Instructions:  Continue your current medications.   *If you need a refill on your cardiac medications before your next appointment, please call your pharmacy*   Lab Work: Your physician recommends that you return for lab work in: [place], TSH   If you have labs (blood work) drawn today and your tests are completely normal, you will receive your results only by: [place] (if you have [place]) OR A paper copy in the mail If you have any lab test that is abnormal or we need to change your treatment, we will call you to review the results.   Testing/Procedures: None ordered today.    Follow-Up: At [facility], you and your health needs are our priority.  As part of our continuing mission to provide you with exceptional heart care, we have created designated Provider Care Teams.  These Care Teams include your primary Cardiologist (physician) and Advanced Practice Providers (APPs -  Physician Assistants and Nurse Practitioners) who all work together to provide you with the care you need, when you need it.  We recommend signing up for the patient portal called "MyChart".  Sign up information is provided on this After Visit Summary.  MyChart is used to connect with patients for Virtual Visits (Telemedicine).  Patients are able to view lab/test results, encounter notes, upcoming appointments, etc.  Non-urgent messages can be sent to your provider as well.   To learn more about what you can do with MyChart, go to [email or web address].    Your next appointment:   4 month(s)  The format for your next appointment:   In Person  Provider:   You may see Lauree Chandler, MD or one of the following Advanced Practice Providers on your designated Care Team:   Melina Copa, PA-C Ermalinda Barrios, PA-C Loel Dubonnet, NP    Other Instructions  Check your blood pressure once per day and keep a log. Call our office in 2 weeks to report your blood pressure readings. If your blood  pressure is consistently more than 130 for the top number we will plan to add additional blood pressure medication.   Recommend reaching out to Dr. Jenny Reichmann about medication to help you with sleep medication.   Heart Healthy Diet Recommendations: A low-salt diet is recommended. Meats should be grilled, baked, or boiled. Avoid fried foods. Focus on lean protein sources like fish or chicken with vegetables and fruits. The American Heart Association is a Microbiologist!  American Heart Association Diet and Lifeystyle Recommendations    Exercise recommendations: The American Heart Association recommends 150 minutes of moderate intensity exercise weekly. Try 30 minutes of moderate intensity exercise 4-5 times per week. This could include walking, jogging, or swimming.

## 2020-11-01 NOTE — Progress Notes (Signed)
Office Visit    Patient Name: Philip Kerr Date of Encounter: 11/01/2020  PCP:  Biagio Borg, MD   [place]  Cardiologist:  Lauree Chandler, MD  Advanced Practice Provider:  No care team member to display Electrophysiologist:  None      Chief Complaint    Philip Kerr is a 87 y.o. male with a hx of CAD s/p CABG, ICM, left bundle branch block, diabetes, CKD 3, hypertension, COVID-19 pneumonia presents today for follow up of CAD and arrhythmia  Past Medical History    Past Medical History:  Diagnosis Date   Aortic insufficiency    Coronary artery disease    coronary artery bypass graft x 5 in 1996   Diabetes mellitus    Hypercholesterolemia    non-insulin dependent   Hypertension    Ischemic cardiomyopathy    Mitral regurgitation    PVC (premature ventricular contraction)    Past Surgical History:  Procedure Laterality Date   CORONARY ARTERY BYPASS GRAFT     LUNG SURGERY     OTHER SURGICAL HISTORY     percutaneous coronary intervention of the  posterolateral segment on 01/27/1998    Allergies  No Known Allergies  History of Present Illness    Philip Kerr is a 87 y.o. male with a hx of CAD s/p CABG, ICM, left bundle branch block, diabetes, CKD 3, hypertension, COVID-19 pneumonia  last seen 09/01/20.  He was admitted 07/22/2020 - 08/08/2020.  On admission he had minimal and stable elevated troponin without EKG changes and was sent home but immediately worsened due to chest pain with weakness and cough found to have transaminitis, possible bacterial pneumonia, COVID-19 positive.  Hospital course complicated by severe delirium which resolved prior to discharge, acute urinary retention requiring Foley, hematuria requiring CBI. Non sustained episodes of V. tach and placed on beta-blocker with subsequent 8-second pauses and WCT. He was recommended for Amidarone 400mg  BID x 14 days then 400mg  QD by cardiology but was discharged on  400mg  BID x13 days then 220m QD.  Echocardiogram during admission showed LVEF improved to 45-50%, severe LVH, moderate MR.  Seen 09/01/2020 for hospital follow-up.  He was having nausea and poor appetite though had started improving over the previous 3 to 4 days since his amiodarone was reduced to 200 mg daily.  He reported occasional palpitations.  He was fatigued and working with physical therapy.  He presents today for follow-up.  Endorses trouble both falling asleep and staying asleep.  Does not snore and has no reported apneic episodes.  He tried over-the-counter remedies with no relief and was encouraged to discuss with primary care.  He endorses fatigue.  Overall sedentary and encouraged to continue his exercises that physical therapy taught him.  Reports no chest pain, pressure, tightness.  Reports no shortness of breath or dyspnea on exertion.  Reports no palpitations.  EKGs/Labs/Other Studies Reviewed:   The following studies were reviewed today: Echo from 07/31/20:    1. Left ventricular ejection fraction, by estimation, is 45 to 50%. The  left ventricle has mildly decreased function. Left ventricular endocardial  border not optimally defined to evaluate regional wall motion. There is  severe asymmetric left ventricular hypertrophy of the septal segment. Left ventricular diastolic parameters are indeterminate.   2. Right ventricular systolic function is normal. The right ventricular  size is normal. Tricuspid regurgitation signal is inadequate for assessing  PA pressure.   3. Left atrial size was mildly  dilated.   4. The mitral valve is abnormal. Moderate mitral valve regurgitation. No  evidence of mitral stenosis.   5. Tricuspid valve regurgitation is mild to moderate.   6. The aortic valve is tricuspid. There is mild calcification of the  aortic valve. There is mild thickening of the aortic valve.   7. Aortic dilatation noted. There is mild dilatation of the aortic root,   measuring 40 mm.    Echo from 09/11/2017:   - Left ventricle: The cavity size was moderately dilated. Wall    thickness was increased in a pattern of severe LVH. Systolic    function was severely reduced. The estimated ejection fraction    was in the range of 25% to 30%. Diffuse hypokinesis. Left    ventricular diastolic function parameters were normal.  - Aortic valve: There was mild regurgitation.  - Mitral valve: There was moderate regurgitation.  - Left atrium: The atrium was moderately dilated.  - Atrial septum: No defect or patent foramen ovale was identified.   EKG:  No EKG today.  Recent Labs: 07/28/2020: ALT 32 07/31/2020: B Natriuretic Peptide 4,160.4 08/07/2020: Magnesium 1.7 08/12/2020: BUN 45; Creatinine, Ser 3.27; Hemoglobin 9.3; Platelets 312.0; Potassium 4.3; Sodium 137  Recent Lipid Panel    Component Value Date/Time   CHOL 103 12/22/2019 1220   CHOL 98 (L) 12/01/2018 0808   TRIG 184.0 (H) 12/22/2019 1220   TRIG 102 10/22/2005 0730   HDL 47.40 12/22/2019 1220   HDL 41 12/01/2018 0808   CHOLHDL 2 12/22/2019 1220   VLDL 36.8 12/22/2019 1220   LDLCALC 18 12/22/2019 1220   LDLCALC 44 07/17/2019 1022    Home Medications   Current Meds  Medication Sig   amiodarone (PACERONE) 200 MG tablet Take 1 tablet (200 mg total) by mouth daily.   aspirin EC 81 MG tablet Take 81 mg by mouth every morning. Swallow whole.   Cholecalciferol (VITAMIN D3 PO) Take 1 capsule by mouth at bedtime.   oxybutynin (DITROPAN) 5 MG tablet TAKE 1 TABLET EVERY 8 HOURS AS NEEDED FOR UP TO 14 DAYS FOR BLADDER SPASMS.   rosuvastatin (CRESTOR) 20 MG tablet Take 1 tablet (20 mg total) by mouth daily.   traZODone (DESYREL) 100 MG tablet Take 1 tablet (100 mg total) by mouth at bedtime as needed for up to 7 days for sleep.   vitamin E 180 MG (400 UNITS) capsule Take 400 Units by mouth daily.     Review of Systems      All other systems reviewed and are otherwise negative except as noted  above.  Physical Exam    VS:  BP (!) 140/54   Pulse (!) 53   Ht 5\' 10"  (1.778 m)   Wt 166 lb 12.8 oz (75.7 kg)   SpO2 96%   BMI 23.93 kg/m  , BMI Body mass index is 23.93 kg/m.  Wt Readings from Last 3 Encounters:  11/01/20 166 lb 12.8 oz (75.7 kg)  09/01/20 166 lb 6.4 oz (75.5 kg)  08/29/20 166 lb 6.4 oz (75.5 kg)     GEN: Well nourished, well developed, in no acute distress. HEENT: normal. Neck: Supple, no JVD, carotid bruits, or masses. Cardiac: RRR, no murmurs, rubs, or gallops. No clubbing, cyanosis, edema.  Radials/PT 2+ and equal bilaterally.  Respiratory:  Respirations regular and unlabored, clear to auscultation bilaterally. GI: Soft, nontender, nondistended. MS: No deformity or atrophy. Skin: Warm and dry, no rash. Neuro:  Strength and sensation are intact. Psych: Normal  affect.  Assessment & Plan    Wide-complex tachycardia while admitted 08/06/2020 which was asymptomatic / NST / High grade AV block with discontinuation of BB / Amiodarone / LBBB - Chronic LBBB. Previously on beta-blocker for GDMT for CHF and NSVT.  However had to be discontinued during 07/2020  admission due to pauses of greater than 8 seconds of high-grade AV block along with WCT. Continue Amiodarone 200mg  QD. He did not yet have TSH/LFT collected for monitoring as previously ordered so we will collect today.   CAD remote CABG - Stable with no anginal symptoms. No indication for ischemic evaluation. Patient prefers conservative approach. GDMT includes aspirin, rosuvastatin.  No beta-blocker due to previous pause, WCT.  HFrEF - 2019 LVEF 25%. 07/31/20 LVEF 45-50%, moderate MR, mild to moderate TR. Intolerant to BB. No ACE/ARB/ARNI/MRA due to renal dysfunction. Euvolemic and well compensated on exam. Low salt diet and fluid restriction <2 liters encouraged. Heart healthy diet and regular cardiovascular exercise encouraged.    CKD - Careful titration of diuretic and antihypertensive.    HTN -BP elevated  today in clinic this reports home readings often at goal of less than 130.  He will check daily for 2 weeks and provide [place] an update via phone.  If blood pressure above goal of 130/80 consider addition of hydralazine or BiDil.  DM2 - Continue to follow with PCP.    Insomnia -endorses difficulty with falling asleep and staying asleep.  No snoring or apneic episodes so will defer sleep study.  He has tried over-the-counter remedies with no relief.  Encouraged to discuss with his primary care provider.  Disposition: Follow up in 4 months with Dr. Angelena Form or APP  Signed, Loel Dubonnet, NP 11/01/2020, 3:44 PM [place]

## 2020-11-02 ENCOUNTER — Encounter (HOSPITAL_BASED_OUTPATIENT_CLINIC_OR_DEPARTMENT_OTHER): Payer: Self-pay | Admitting: Family

## 2020-11-02 LAB — COMPREHENSIVE METABOLIC PANEL
ALT: 31 IU/L (ref 0–44)
AST: 30 IU/L (ref 0–40)
Albumin/Globulin Ratio: 1.6 (ref 1.2–2.2)
Albumin: 4.1 g/dL (ref 3.6–4.6)
Alkaline Phosphatase: 61 IU/L (ref 44–121)
BUN/Creatinine Ratio: 15 (ref 10–24)
BUN: 20 mg/dL (ref 8–27)
Bilirubin Total: 0.2 mg/dL (ref 0.0–1.2)
CO2: 21 mmol/L (ref 20–29)
Calcium: 8.9 mg/dL (ref 8.6–10.2)
Chloride: 103 mmol/L (ref 96–106)
Creatinine, Ser: 1.32 mg/dL — ABNORMAL HIGH (ref 0.76–1.27)
Globulin, Total: 2.6 g/dL (ref 1.5–4.5)
Glucose: 105 mg/dL — ABNORMAL HIGH (ref 70–99)
Potassium: 4.7 mmol/L (ref 3.5–5.2)
Sodium: 137 mmol/L (ref 134–144)
Total Protein: 6.7 g/dL (ref 6.0–8.5)
eGFR: 53 mL/min/{1.73_m2} — ABNORMAL LOW (ref >59)

## 2020-11-02 LAB — TSH: TSH: 7.34 u[IU]/mL — ABNORMAL HIGH (ref 0.450–4.500)

## 2020-11-02 NOTE — Progress Notes (Signed)
Error

## 2020-11-03 ENCOUNTER — Ambulatory Visit (INDEPENDENT_AMBULATORY_CARE_PROVIDER_SITE_OTHER): Payer: Medicare HMO | Admitting: Internal Medicine

## 2020-11-03 ENCOUNTER — Encounter: Payer: Self-pay | Admitting: Internal Medicine

## 2020-11-03 ENCOUNTER — Other Ambulatory Visit: Payer: Self-pay | Admitting: Internal Medicine

## 2020-11-03 ENCOUNTER — Other Ambulatory Visit: Payer: Self-pay

## 2020-11-03 VITALS — BP 128/56 | HR 49 | Ht 70.0 in | Wt 166.0 lb

## 2020-11-03 DIAGNOSIS — Z0001 Encounter for general adult medical examination with abnormal findings: Secondary | ICD-10-CM | POA: Diagnosis not present

## 2020-11-03 DIAGNOSIS — F5101 Primary insomnia: Secondary | ICD-10-CM

## 2020-11-03 DIAGNOSIS — F03918 Unspecified dementia, unspecified severity, with other behavioral disturbance: Secondary | ICD-10-CM | POA: Diagnosis not present

## 2020-11-03 DIAGNOSIS — R519 Headache, unspecified: Secondary | ICD-10-CM | POA: Insufficient documentation

## 2020-11-03 DIAGNOSIS — E02 Subclinical iodine-deficiency hypothyroidism: Secondary | ICD-10-CM

## 2020-11-03 DIAGNOSIS — E038 Other specified hypothyroidism: Secondary | ICD-10-CM

## 2020-11-03 DIAGNOSIS — F519 Sleep disorder not due to a substance or known physiological condition, unspecified: Secondary | ICD-10-CM | POA: Diagnosis not present

## 2020-11-03 DIAGNOSIS — F02818 Dementia in other diseases classified elsewhere, unspecified severity, with other behavioral disturbance: Secondary | ICD-10-CM

## 2020-11-03 DIAGNOSIS — E119 Type 2 diabetes mellitus without complications: Secondary | ICD-10-CM

## 2020-11-03 DIAGNOSIS — G4700 Insomnia, unspecified: Secondary | ICD-10-CM

## 2020-11-03 DIAGNOSIS — E559 Vitamin D deficiency, unspecified: Secondary | ICD-10-CM

## 2020-11-03 DIAGNOSIS — E538 Deficiency of other specified B group vitamins: Secondary | ICD-10-CM

## 2020-11-03 DIAGNOSIS — Z23 Encounter for immunization: Secondary | ICD-10-CM | POA: Diagnosis not present

## 2020-11-03 LAB — URINALYSIS, ROUTINE W REFLEX MICROSCOPIC
Bilirubin Urine: NEGATIVE
Ketones, ur: NEGATIVE
Nitrite: POSITIVE — AB
Specific Gravity, Urine: 1.01 (ref 1.000–1.030)
Total Protein, Urine: NEGATIVE
Urine Glucose: NEGATIVE
Urobilinogen, UA: 0.2 (ref 0.0–1.0)
pH: 6 (ref 5.0–8.0)

## 2020-11-03 LAB — HEMOGLOBIN A1C: Hgb A1c MFr Bld: 6.1 % (ref 4.6–6.5)

## 2020-11-03 LAB — LIPID PANEL
Cholesterol: 90 mg/dL (ref 0–200)
HDL: 44 mg/dL (ref >39.00)
LDL Cholesterol: 28 mg/dL (ref 0–99)
NonHDL: 46.41
Total CHOL/HDL Ratio: 2
Triglycerides: 92 mg/dL (ref 0.0–149.0)
VLDL: 18.4 mg/dL (ref 0.0–40.0)

## 2020-11-03 LAB — CBC WITH DIFFERENTIAL/PLATELET
Basophils Absolute: 0 10*3/uL (ref 0.0–0.1)
Basophils Relative: 0.5 % (ref 0.0–3.0)
Eosinophils Absolute: 0.4 10*3/uL (ref 0.0–0.7)
Eosinophils Relative: 4.9 % (ref 0.0–5.0)
HCT: 34.1 % — ABNORMAL LOW (ref 39.0–52.0)
Hemoglobin: 11.3 g/dL — ABNORMAL LOW (ref 13.0–17.0)
Lymphocytes Relative: 46.5 % — ABNORMAL HIGH (ref 12.0–46.0)
Lymphs Abs: 4.2 10*3/uL — ABNORMAL HIGH (ref 0.7–4.0)
MCHC: 33.1 g/dL (ref 30.0–36.0)
MCV: 96.1 fl (ref 78.0–100.0)
Monocytes Absolute: 0.6 10*3/uL (ref 0.1–1.0)
Monocytes Relative: 6.4 % (ref 3.0–12.0)
Neutro Abs: 3.8 10*3/uL (ref 1.4–7.7)
Neutrophils Relative %: 41.7 % — ABNORMAL LOW (ref 43.0–77.0)
Platelets: 216 10*3/uL (ref 150.0–400.0)
RBC: 3.55 Mil/uL — ABNORMAL LOW (ref 4.22–5.81)
RDW: 15.9 % — ABNORMAL HIGH (ref 11.5–15.5)
WBC: 9.1 10*3/uL (ref 4.0–10.5)

## 2020-11-03 LAB — T3, FREE: T3, Free: 2 pg/mL (ref 2.0–4.4)

## 2020-11-03 LAB — SPECIMEN STATUS REPORT

## 2020-11-03 LAB — MICROALBUMIN / CREATININE URINE RATIO
Creatinine,U: 68.8 mg/dL
Microalb Creat Ratio: 10.8 mg/g (ref 0.0–30.0)
Microalb, Ur: 7.4 mg/dL — ABNORMAL HIGH (ref 0.0–1.9)

## 2020-11-03 LAB — T4, FREE: Free T4: 1.07 ng/dL (ref 0.82–1.77)

## 2020-11-03 LAB — C-REACTIVE PROTEIN: CRP: <1 mg/dL (ref 0.5–20.0)

## 2020-11-03 LAB — VITAMIN B12: Vitamin B-12: 337 pg/mL (ref 211–911)

## 2020-11-03 LAB — VITAMIN D 25 HYDROXY (VIT D DEFICIENCY, FRACTURES): VITD: 50.48 ng/mL (ref 30.00–100.00)

## 2020-11-03 LAB — SEDIMENTATION RATE: Sed Rate: 18 mm/hr (ref 0–20)

## 2020-11-03 MED ORDER — QUETIAPINE FUMARATE 50 MG PO TABS: 50.0000 mg | ORAL_TABLET | Freq: Every day | ORAL | 3 refills | Status: DC

## 2020-11-03 MED ORDER — CIPROFLOXACIN HCL 500 MG PO TABS
500.0000 mg | ORAL_TABLET | Freq: Two times a day (BID) | ORAL | 0 refills | Status: DC
Start: 2020-11-03 — End: 2020-11-03

## 2020-11-03 NOTE — Assessment & Plan Note (Signed)
Mild to mod persistent, for seroquel 50 qhs

## 2020-11-03 NOTE — Assessment & Plan Note (Signed)
Exam benign, or esr and crp with labs

## 2020-11-03 NOTE — Assessment & Plan Note (Signed)
Overall stable, cont current med tx - declines aricept or neuro f/u for now

## 2020-11-03 NOTE — Progress Notes (Signed)
Patient ID: Philip Kerr, male   DOB: 12/24/1933, 87 y.o.   MRN: [id number]         Chief Complaint:: wellness exam and insomnia, right templ HA pain, and recent elevated tsh       HPI:  Philip Kerr is a 86 y.o. male here for wellness exam with wife, declines optho referral, covid booster, shingrix and pneumovax for now ; o/w up to date.                          Also Denies hyper or hypo thyroid symptoms such as voice, skin or hair change   Taking amiodarone , found to have slightly elevated TSH, but normal T4 and T3.  Does have vague hx of HA's, today says is right temple, sharp and dull, intermittent, nontender to touch, nothing seems to make it better or worse for several months.  Dementia overall stable symptomatically, and not assoc with behavioral changes such as hallucinations, paranoia, or agitation, but does have difficutly with insomnia every night, takes hours to get to sleep.  Pt denies chest pain, increased sob or doe, wheezing, orthopnea, PND, increased LE swelling, palpitations, dizziness or syncope.   Pt denies polydipsia, polyuria, or new focal neuro s/s.   Wt Readings from Last 3 Encounters:  11/03/20 166 lb (75.3 kg)  11/01/20 166 lb 12.8 oz (75.7 kg)  09/01/20 166 lb 6.4 oz (75.5 kg)   BP Readings from Last 3 Encounters:  11/03/20 (!) 128/56  11/01/20 (!) 140/54  09/01/20 130/60   Immunization History  Administered Date(s) Administered   Fluad Quad(high Dose 65+) 11/03/2020   Influenza, High Dose Seasonal PF 09/06/2017, 11/19/2018   Influenza-Unspecified 10/08/2016   PFIZER(Purple Top)SARS-COV-2 Vaccination 08/16/2019   Pneumococcal Conjugate-13 07/17/2019   Tdap 05/22/2019   There are no preventive care reminders to display for this patient.     Past Medical History:  Diagnosis Date   Aortic insufficiency    Coronary artery disease    coronary artery bypass graft x 5 in 1996   Diabetes mellitus    Hypercholesterolemia    non-insulin dependent    Hypertension    Ischemic cardiomyopathy    Mitral regurgitation    PVC (premature ventricular contraction)    Past Surgical History:  Procedure Laterality Date   CORONARY ARTERY BYPASS GRAFT     LUNG SURGERY     OTHER SURGICAL HISTORY     percutaneous coronary intervention of the  posterolateral segment on 01/27/1998    reports that he has never smoked. He has never used smokeless tobacco. He reports that he does not drink alcohol and does not use drugs. family history is not on file. No Known Allergies Current Outpatient Medications on File Prior to Visit  Medication Sig Dispense Refill   amiodarone (PACERONE) 200 MG tablet Take 1 tablet (200 mg total) by mouth daily. 30 tablet 5   aspirin EC 81 MG tablet Take 81 mg by mouth every morning. Swallow whole.     Cholecalciferol (VITAMIN D3 PO) Take 1 capsule by mouth at bedtime.     oxybutynin (DITROPAN) 5 MG tablet TAKE 1 TABLET EVERY 8 HOURS AS NEEDED FOR UP TO 14 DAYS FOR BLADDER SPASMS. 270 tablet 1   rosuvastatin (CRESTOR) 20 MG tablet Take 1 tablet (20 mg total) by mouth daily. 90 tablet 3   vitamin E 180 MG (400 UNITS) capsule Take 400 Units by mouth daily.  traZODone (DESYREL) 100 MG tablet Take 1 tablet (100 mg total) by mouth at bedtime as needed for up to 7 days for sleep. 7 tablet 0   No current facility-administered medications on file prior to visit.        ROS:  All others reviewed and negative.  Objective        PE:  BP (!) 128/56 (BP Location: Right Arm, Patient Position: Sitting, Cuff Size: Large)   Pulse (!) 49   Ht 5' 10"  (1.778 m)   Wt 166 lb (75.3 kg)   SpO2 99%   BMI 23.82 kg/m                 Constitutional: Pt appears in NAD               HENT: Head: NCAT.                Right Ear: External ear normal.                 Left Ear: External ear normal.                Eyes: . Pupils are equal, round, and reactive to light. Conjunctivae and EOM are normal;  right temple nontender, non swelling                Nose: without d/c or deformity               Neck: Neck supple. Gross normal ROM               Cardiovascular: Normal rate and regular rhythm.                 Pulmonary/Chest: Effort normal and breath sounds without rales or wheezing.                Abd:  Soft, NT, ND, + BS, no organomegaly               Neurological: Pt is alert. At baseline orientation, motor grossly intact               Skin: Skin is warm. No rashes, no other new lesions, LE edema - none               Psychiatric: Pt behavior is normal without agitation   Micro: none  Cardiac tracings I have personally interpreted today:  none  Pertinent Radiological findings (summarize): none   Lab Results  Component Value Date   WBC 9.1 11/03/2020   HGB 11.3 (L) 11/03/2020   HCT 34.1 (L) 11/03/2020   PLT 216.0 11/03/2020   GLUCOSE 105 (H) 11/01/2020   CHOL 90 11/03/2020   TRIG 92.0 11/03/2020   HDL 44.00 11/03/2020   LDLCALC 28 11/03/2020   ALT 31 11/01/2020   AST 30 11/01/2020   NA 137 11/01/2020   K 4.7 11/01/2020   CL 103 11/01/2020   CREATININE 1.32 (H) 11/01/2020   BUN 20 11/01/2020   CO2 21 11/01/2020   TSH 7.340 (H) 11/01/2020   PSA 1.97 09/06/2017   HGBA1C 6.1 11/03/2020   MICROALBUR 7.4 (H) 11/03/2020   Assessment/Plan:  Philip Kerr is a 87 y.o. White or Caucasian [1] male with  has a past medical history of Aortic insufficiency, Coronary artery disease, Diabetes mellitus, Hypercholesterolemia, Hypertension, Ischemic cardiomyopathy, Mitral regurgitation, and PVC (premature ventricular contraction).  Encounter for well adult exam with abnormal findings Age and sex appropriate education and counseling updated  with regular exercise and diet Referrals for preventative services - decliens optho referral Immunizations addressed - declines covid booster, shingrix, pneumovax but for flu shot today Smoking counseling  - none needed Evidence for depression or other mood disorder - none significant Most  recent labs reviewed. I have personally reviewed and have noted: 1) the patient's medical and social history 2) The patient's current medications and supplements 3) The patient's height, weight, and BMI have been recorded in the chart   Diabetes mellitus type II, non insulin dependent ([place]) Lab Results  Component Value Date   HGBA1C 6.1 11/03/2020   Stable, pt to continue current medical treatment  - diet   Dementia with behavioral disturbance Overall stable, cont current med tx - declines aricept or neuro f/u for now  Vitamin D deficiency Last vitamin D Lab Results  Component Value Date   VD25OH 50.48 11/03/2020   Stable, cont oral replacement   Right sided temporal headache Exam benign, or esr and crp with labs  Insomnia Mild to mod persistent, for seroquel 50 qhs   Subclinical hypothyroidism Recent onst, likely related to amio, cont current med tx and follow labs and clinical  Followup: Return in about 6 months (around 05/04/2021).  Cathlean Cower, MD 11/03/2020 10:56 PM [facility]

## 2020-11-03 NOTE — Patient Instructions (Signed)
Please take all new medication as prescribed - the seroquel 50 mg for sleep  Please call in 2 weeks if not improved, to consider increase to 100 mg  Please continue all other medications as before, and refills have been done if requested.  Please have the pharmacy call with any other refills you may need.  Please continue your efforts at being more active, low cholesterol diet, and weight control.  You are otherwise up to date with prevention measures today.  Please keep your appointments with your specialists as you may have planned  Please go to the LAB at the blood drawing area for the tests to be done  You will be contacted by phone if any changes need to be made immediately.  Otherwise, you will receive a letter about your results with an explanation, but please check with MyChart first.  Please remember to sign up for MyChart if you have not done so, as this will be important to you in the future with finding out test results, communicating by private email, and scheduling acute appointments online when needed.  Please make an Appointment to return in 6 months, or sooner if needed

## 2020-11-03 NOTE — Assessment & Plan Note (Signed)
Lab Results  Component Value Date   HGBA1C 6.1 11/03/2020   Stable, pt to continue current medical treatment  - diet

## 2020-11-03 NOTE — Assessment & Plan Note (Signed)
Recent onst, likely related to amio, cont current med tx and follow labs and clinical

## 2020-11-03 NOTE — Assessment & Plan Note (Signed)
Last vitamin D Lab Results  Component Value Date   VD25OH 50.48 11/03/2020   Stable, cont oral replacement

## 2020-11-03 NOTE — Assessment & Plan Note (Signed)
Age and sex appropriate education and counseling updated with regular exercise and diet Referrals for preventative services - decliens optho referral Immunizations addressed - declines covid booster, shingrix, pneumovax but for flu shot today Smoking counseling  - none needed Evidence for depression or other mood disorder - none significant Most recent labs reviewed. I have personally reviewed and have noted: 1) the patient's medical and social history 2) The patient's current medications and supplements 3) The patient's height, weight, and BMI have been recorded in the chart

## 2020-11-07 DIAGNOSIS — N179 Acute kidney failure, unspecified: Secondary | ICD-10-CM | POA: Diagnosis not present

## 2020-11-07 DIAGNOSIS — R339 Retention of urine, unspecified: Secondary | ICD-10-CM | POA: Diagnosis not present

## 2020-11-07 DIAGNOSIS — I129 Hypertensive chronic kidney disease with stage 1 through stage 4 chronic kidney disease, or unspecified chronic kidney disease: Secondary | ICD-10-CM | POA: Diagnosis not present

## 2020-11-07 DIAGNOSIS — I5023 Acute on chronic systolic (congestive) heart failure: Secondary | ICD-10-CM | POA: Diagnosis not present

## 2020-11-07 DIAGNOSIS — N1832 Chronic kidney disease, stage 3b: Secondary | ICD-10-CM | POA: Diagnosis not present

## 2020-11-14 ENCOUNTER — Ambulatory Visit (HOSPITAL_BASED_OUTPATIENT_CLINIC_OR_DEPARTMENT_OTHER): Payer: Medicare HMO | Admitting: Family

## 2020-11-14 ENCOUNTER — Other Ambulatory Visit: Payer: Self-pay

## 2020-11-14 ENCOUNTER — Telehealth: Payer: Self-pay | Admitting: Family

## 2020-11-14 ENCOUNTER — Encounter (HOSPITAL_BASED_OUTPATIENT_CLINIC_OR_DEPARTMENT_OTHER): Payer: Self-pay | Admitting: Family

## 2020-11-14 VITALS — BP 126/60 | HR 55 | Ht 70.0 in | Wt 168.5 lb

## 2020-11-14 DIAGNOSIS — Z79899 Other long term (current) drug therapy: Secondary | ICD-10-CM | POA: Diagnosis not present

## 2020-11-14 DIAGNOSIS — I10 Essential (primary) hypertension: Secondary | ICD-10-CM | POA: Diagnosis not present

## 2020-11-14 DIAGNOSIS — I5022 Chronic systolic (congestive) heart failure: Secondary | ICD-10-CM

## 2020-11-14 DIAGNOSIS — I4729 Other ventricular tachycardia: Secondary | ICD-10-CM | POA: Diagnosis not present

## 2020-11-14 DIAGNOSIS — R000 Tachycardia, unspecified: Secondary | ICD-10-CM | POA: Diagnosis not present

## 2020-11-14 MED ORDER — FUROSEMIDE 20 MG PO TABS: 20.0000 mg | ORAL_TABLET | ORAL | 2 refills | Status: DC | PRN

## 2020-11-14 NOTE — Patient Instructions (Addendum)
Medication Instructions:  Please take Lasix 20mg  as needed *If you need a refill on your cardiac medications before your next appointment, please call your pharmacy*   Lab Work: None today    Testing/Procedures: None today   Follow-Up: At [facility], you and your health needs are our priority.  As part of our continuing mission to provide you with exceptional heart care, we have created designated Provider Care Teams.  These Care Teams include your primary Cardiologist (physician) and Advanced Practice Providers (APPs -  Physician Assistants and Nurse Practitioners) who all work together to provide you with the care you need, when you need it.  We recommend signing up for the patient portal called "MyChart".  Sign up information is provided on this After Visit Summary.  MyChart is used to connect with patients for Virtual Visits (Telemedicine).  Patients are able to view lab/test results, encounter notes, upcoming appointments, etc.  Non-urgent messages can be sent to your provider as well.   To learn more about what you can do with MyChart, go to [email or web address].    Your next appointment:   Please keep your appointment as is in February   The format for your next appointment:   In Person  Provider:   Laurann Montana, [facility]  Other Instructions Please bring your blood pressure cuff to your next office visit.   If you are taking Furosemide more than once per week please call our office.    To prevent or reduce lower extremity swelling: Eat a low salt diet. Salt makes the body hold onto extra fluid which causes swelling. Sit with legs elevated. For example, in the recliner or on an [place].  Wear knee-high compression stockings during the daytime. Ones labeled 15-20 mmHg provide good compression.

## 2020-11-14 NOTE — Telephone Encounter (Signed)
Patient experiencing extremely high blood pressure.....scheduled patient for this afternoon at 2:45 pm to see Terie Purser

## 2020-11-14 NOTE — Progress Notes (Signed)
Office Visit    Patient Name: Philip Kerr Date of Encounter: 11/14/2020  PCP:  Biagio Borg, MD   [place]  Cardiologist:  Lauree Chandler, MD  Advanced Practice Provider:  No care team member to display Electrophysiologist:  None      Chief Complaint    Philip Kerr is a 87 y.o. male with a hx of CAD s/p CABG, ICM, left bundle branch block, diabetes, CKD 3, hypertension, COVID-19 pneumonia presents today for follow up of CAD and arrhythmia  Past Medical History    Past Medical History:  Diagnosis Date   Aortic insufficiency    Coronary artery disease    coronary artery bypass graft x 5 in 1996   Diabetes mellitus    Hypercholesterolemia    non-insulin dependent   Hypertension    Ischemic cardiomyopathy    Mitral regurgitation    PVC (premature ventricular contraction)    Past Surgical History:  Procedure Laterality Date   CORONARY ARTERY BYPASS GRAFT     LUNG SURGERY     OTHER SURGICAL HISTORY     percutaneous coronary intervention of the  posterolateral segment on 01/27/1998    Allergies  No Known Allergies  History of Present Illness    Philip Kerr is a 87 y.o. male with a hx of CAD s/p CABG, ICM, left bundle branch block, diabetes, CKD 3, hypertension, COVID-19 pneumonia  last seen 09/01/20.  He was admitted 07/22/2020 - 08/08/2020.  On admission he had minimal and stable elevated troponin without EKG changes and was sent home but immediately worsened due to chest pain with weakness and cough found to have transaminitis, possible bacterial pneumonia, COVID-19 positive.  Hospital course complicated by severe delirium which resolved prior to discharge, acute urinary retention requiring Foley, hematuria requiring CBI. Non sustained episodes of V. tach and placed on beta-blocker with subsequent 8-second pauses and WCT. He was recommended for Amidarone 400mg  BID x 14 days then 400mg  QD by cardiology but was discharged on  400mg  BID x13 days then 227m QD.  Echocardiogram during admission showed LVEF improved to 45-50%, severe LVH, moderate MR.  Seen 09/01/2020 for hospital follow-up.  He was having nausea and poor appetite though had started improving over the previous 3 to 4 days since his amiodarone was reduced to 200 mg daily.  He reported occasional palpitations.  He was fatigued and working with physical therapy.  He was seen 11/01/20. Noted trouble sleeping and was encouraged to discuss with PCP. No snoring nor apnea and sleep study not recommended. He was concerned about elevated BP readings at home but well controlled in clinic and current medications continued.   Presents today for follow up after calling to report elevated BP. His home readings with arm cuff taken two hours after medications are routinely in the 140s/70s. Reports no shortness of breath nor dyspnea on exertion. Reports no chest pain, pressure, or tightness. No  orthopnea, PND. Reports no palpitations. Notes intermittent bilateral pedal edema - wonders whether he needs a fluid pill.   EKGs/Labs/Other Studies Reviewed:   The following studies were reviewed today: Echo from 07/31/20:    1. Left ventricular ejection fraction, by estimation, is 45 to 50%. The  left ventricle has mildly decreased function. Left ventricular endocardial  border not optimally defined to evaluate regional wall motion. There is  severe asymmetric left ventricular hypertrophy of the septal segment. Left ventricular diastolic parameters are indeterminate.   2. Right ventricular systolic  function is normal. The right ventricular  size is normal. Tricuspid regurgitation signal is inadequate for assessing  PA pressure.   3. Left atrial size was mildly dilated.   4. The mitral valve is abnormal. Moderate mitral valve regurgitation. No  evidence of mitral stenosis.   5. Tricuspid valve regurgitation is mild to moderate.   6. The aortic valve is tricuspid. There is mild  calcification of the  aortic valve. There is mild thickening of the aortic valve.   7. Aortic dilatation noted. There is mild dilatation of the aortic root,  measuring 40 mm.    Echo from 09/11/2017:   - Left ventricle: The cavity size was moderately dilated. Wall    thickness was increased in a pattern of severe LVH. Systolic    function was severely reduced. The estimated ejection fraction    was in the range of 25% to 30%. Diffuse hypokinesis. Left    ventricular diastolic function parameters were normal.  - Aortic valve: There was mild regurgitation.  - Mitral valve: There was moderate regurgitation.  - Left atrium: The atrium was moderately dilated.  - Atrial septum: No defect or patent foramen ovale was identified.   EKG:  No EKG today.  Recent Labs: 07/31/2020: B Natriuretic Peptide 4,160.4 08/07/2020: Magnesium 1.7 11/01/2020: ALT 31; BUN 20; Creatinine, Ser 1.32; Potassium 4.7; Sodium 137; TSH 7.340 11/03/2020: Hemoglobin 11.3; Platelets 216.0  Recent Lipid Panel    Component Value Date/Time   CHOL 90 11/03/2020 1544   CHOL 98 (L) 12/01/2018 0808   TRIG 92.0 11/03/2020 1544   TRIG 102 10/22/2005 0730   HDL 44.00 11/03/2020 1544   HDL 41 12/01/2018 0808   CHOLHDL 2 11/03/2020 1544   VLDL 18.4 11/03/2020 1544   LDLCALC 28 11/03/2020 1544   LDLCALC 44 07/17/2019 1022    Home Medications   Current Meds  Medication Sig   amiodarone (PACERONE) 200 MG tablet Take 1 tablet (200 mg total) by mouth daily.   aspirin EC 81 MG tablet Take 81 mg by mouth every morning. Swallow whole.   Cholecalciferol (VITAMIN D3 PO) Take 1 capsule by mouth at bedtime.   furosemide (LASIX) 20 MG tablet Take 1 tablet (20 mg total) by mouth as needed.   oxybutynin (DITROPAN) 5 MG tablet TAKE 1 TABLET EVERY 8 HOURS AS NEEDED FOR UP TO 14 DAYS FOR BLADDER SPASMS.   QUEtiapine (SEROQUEL) 50 MG tablet Take 1 tablet (50 mg total) by mouth at bedtime.   rosuvastatin (CRESTOR) 20 MG tablet Take 1  tablet (20 mg total) by mouth daily.   vitamin E 180 MG (400 UNITS) capsule Take 400 Units by mouth daily.     Review of Systems      All other systems reviewed and are otherwise negative except as noted above.  Physical Exam    VS:  BP 126/60   Pulse (!) 55   Ht 5\' 10"  (1.778 m)   Wt 168 lb 8 oz (76.4 kg)   SpO2 97%   BMI 24.18 kg/m  , BMI Body mass index is 24.18 kg/m.  Wt Readings from Last 3 Encounters:  11/14/20 168 lb 8 oz (76.4 kg)  11/03/20 166 lb (75.3 kg)  11/01/20 166 lb 12.8 oz (75.7 kg)     GEN: Well nourished, well developed, in no acute distress. HEENT: normal. Neck: Supple, no JVD, carotid bruits, or masses. Cardiac: RRR, no murmurs, rubs, or gallops. No clubbing, cyanosis, edema.  Radials/PT 2+ and equal bilaterally.  Respiratory:  Respirations regular and unlabored, clear to auscultation bilaterally. GI: Soft, nontender, nondistended. MS: No deformity or atrophy. Skin: Warm and dry, no rash. Neuro:  Strength and sensation are intact. Psych: Normal affect.  Assessment & Plan    Wide-complex tachycardia while admitted 08/06/2020 which was asymptomatic / NST / High grade AV block with discontinuation of BB / Amiodarone / LBBB - Chronic LBBB. Previously on beta-blocker for GDMT for CHF and NSVT.  However had to be discontinued during 07/2020  admission due to pauses of greater than 8 seconds of high-grade AV block along with WCT. Continue Amiodarone 200mg  QD. 11/01/20 normal LFTs, elevated TSH 7.34 but normal T3 and T4. Recommended for monitoring by primary care for sublinical hypothyroidism.   CAD remote CABG - Stable with no anginal symptoms. No indication for ischemic evaluation. Patient prefers conservative approach. GDMT includes aspirin, rosuvastatin.  No beta-blocker due to previous pause, WCT.  HFrEF - 2019 LVEF 25%. 07/31/20 LVEF 45-50%, moderate MR, mild to moderate TR. Intolerant to BB. No ACE/ARB/ARNI/MRA due to renal dysfunction. Euvolemic and well  compensated on exam. Low salt diet and fluid restriction <2 liters encouraged. Heart healthy diet and regular cardiovascular exercise encouraged.  NOtes intermittent LE edema. Will Rx PRN Lasix 20mg  QD - if using more than once per week he will contact us and will require nephrology input.   CKD - Careful titration of diuretic and antihypertensive.    HTN - BP well controlled in clinic though elevated at home. Will bring BP cuff ot next visit to ensure accuracy. BP goal <130/80. If not at goal, consider Hydralazine.   DM2 - Continue to follow with PCP.    Insomnia -endorses difficulty with falling asleep and staying asleep.  No snoring or apneic episodes so will defer sleep study.  He has tried over-the-counter remedies with no relief.  Encouraged to discuss with his primary care provider.  Disposition: Follow up in February as scheduled.   Signed, Loel Dubonnet, NP 11/14/2020, 7:45 PM [facility]

## 2020-11-25 ENCOUNTER — Telehealth: Payer: Self-pay | Admitting: Family

## 2020-11-25 DIAGNOSIS — R42 Dizziness and giddiness: Secondary | ICD-10-CM

## 2020-11-25 DIAGNOSIS — R001 Bradycardia, unspecified: Secondary | ICD-10-CM

## 2020-11-25 NOTE — Telephone Encounter (Signed)
Pt is really shaky and weak, not stable on his feet, complains of headaches and is sleeping a lot more than usual but no other symptoms... please advise

## 2020-11-25 NOTE — Telephone Encounter (Signed)
Spoke with patient regarding dizzy spells. Patient reports that over the past month, the dizziness has become worse. He checked his blood pressure 165/72, pulse 63. He is taking all of his medication and eats well. He does not use an ambulatory assistive device. Advised patient to be careful when ambulating. Advised patient I would speak with our triage doctor. Dr. Sallyanne Kuster ordered a 2-week zio live monitor to assess for possible pacemaker need. Order placed and spouse of patient advised. Advised spouse to take patient to the ED if dizzy spells get worse. She voiced understanding of conversation and the purpose of the monitor.

## 2020-11-28 ENCOUNTER — Ambulatory Visit (INDEPENDENT_AMBULATORY_CARE_PROVIDER_SITE_OTHER): Payer: Medicare HMO

## 2020-11-28 DIAGNOSIS — R42 Dizziness and giddiness: Secondary | ICD-10-CM

## 2020-11-28 DIAGNOSIS — R001 Bradycardia, unspecified: Secondary | ICD-10-CM

## 2020-11-28 NOTE — Progress Notes (Unsigned)
Enrolled for [employer] to mail a ZIO AT Live Telemetry monitor to patients address on file.   Dr. McAlhany to read. 

## 2020-12-01 DIAGNOSIS — R001 Bradycardia, unspecified: Secondary | ICD-10-CM | POA: Diagnosis not present

## 2020-12-01 DIAGNOSIS — R42 Dizziness and giddiness: Secondary | ICD-10-CM

## 2020-12-02 ENCOUNTER — Other Ambulatory Visit: Payer: Self-pay | Admitting: Internal Medicine

## 2020-12-05 DIAGNOSIS — R001 Bradycardia, unspecified: Secondary | ICD-10-CM | POA: Diagnosis not present

## 2020-12-05 DIAGNOSIS — R42 Dizziness and giddiness: Secondary | ICD-10-CM | POA: Diagnosis not present

## 2020-12-06 ENCOUNTER — Emergency Department (HOSPITAL_COMMUNITY): Payer: Medicare HMO

## 2020-12-06 ENCOUNTER — Inpatient Hospital Stay (HOSPITAL_COMMUNITY)
Admission: EM | Admit: 2020-12-06 | Discharge: 2020-12-08 | DRG: 243 | Disposition: A | Discharge status: Home / Self Care | Payer: Medicare HMO | Attending: Cardiology | Admitting: Cardiology

## 2020-12-06 ENCOUNTER — Telehealth: Payer: Self-pay | Admitting: Student in an Organized Health Care Education/Training Program

## 2020-12-06 ENCOUNTER — Encounter (HOSPITAL_COMMUNITY): Payer: Self-pay | Admitting: Emergency Medicine

## 2020-12-06 ENCOUNTER — Other Ambulatory Visit: Payer: Self-pay

## 2020-12-06 DIAGNOSIS — N1831 Chronic kidney disease, stage 3a: Secondary | ICD-10-CM | POA: Diagnosis present

## 2020-12-06 DIAGNOSIS — Z79899 Other long term (current) drug therapy: Secondary | ICD-10-CM

## 2020-12-06 DIAGNOSIS — Z95818 Presence of other cardiac implants and grafts: Secondary | ICD-10-CM

## 2020-12-06 DIAGNOSIS — I495 Sick sinus syndrome: Principal | ICD-10-CM | POA: Diagnosis present

## 2020-12-06 DIAGNOSIS — E7800 Pure hypercholesterolemia, unspecified: Secondary | ICD-10-CM | POA: Diagnosis present

## 2020-12-06 DIAGNOSIS — E559 Vitamin D deficiency, unspecified: Secondary | ICD-10-CM | POA: Diagnosis present

## 2020-12-06 DIAGNOSIS — I472 Ventricular tachycardia, unspecified: Secondary | ICD-10-CM | POA: Diagnosis present

## 2020-12-06 DIAGNOSIS — I5022 Chronic systolic (congestive) heart failure: Secondary | ICD-10-CM | POA: Diagnosis present

## 2020-12-06 DIAGNOSIS — I517 Cardiomegaly: Secondary | ICD-10-CM | POA: Diagnosis not present

## 2020-12-06 DIAGNOSIS — I440 Atrioventricular block, first degree: Secondary | ICD-10-CM | POA: Diagnosis present

## 2020-12-06 DIAGNOSIS — R001 Bradycardia, unspecified: Secondary | ICD-10-CM | POA: Diagnosis not present

## 2020-12-06 DIAGNOSIS — I130 Hypertensive heart and chronic kidney disease with heart failure and stage 1 through stage 4 chronic kidney disease, or unspecified chronic kidney disease: Secondary | ICD-10-CM | POA: Diagnosis present

## 2020-12-06 DIAGNOSIS — I455 Other specified heart block: Secondary | ICD-10-CM

## 2020-12-06 DIAGNOSIS — I2510 Atherosclerotic heart disease of native coronary artery without angina pectoris: Secondary | ICD-10-CM | POA: Diagnosis present

## 2020-12-06 DIAGNOSIS — J9811 Atelectasis: Secondary | ICD-10-CM | POA: Diagnosis not present

## 2020-12-06 DIAGNOSIS — G2581 Restless legs syndrome: Secondary | ICD-10-CM | POA: Diagnosis present

## 2020-12-06 DIAGNOSIS — Z951 Presence of aortocoronary bypass graft: Secondary | ICD-10-CM

## 2020-12-06 DIAGNOSIS — Z7982 Long term (current) use of aspirin: Secondary | ICD-10-CM | POA: Diagnosis not present

## 2020-12-06 DIAGNOSIS — I255 Ischemic cardiomyopathy: Secondary | ICD-10-CM | POA: Diagnosis present

## 2020-12-06 DIAGNOSIS — Z8616 Personal history of COVID-19: Secondary | ICD-10-CM | POA: Diagnosis not present

## 2020-12-06 DIAGNOSIS — I509 Heart failure, unspecified: Secondary | ICD-10-CM | POA: Diagnosis not present

## 2020-12-06 DIAGNOSIS — E1122 Type 2 diabetes mellitus with diabetic chronic kidney disease: Secondary | ICD-10-CM | POA: Diagnosis present

## 2020-12-06 DIAGNOSIS — Z950 Presence of cardiac pacemaker: Secondary | ICD-10-CM | POA: Diagnosis not present

## 2020-12-06 LAB — CBC WITH DIFFERENTIAL/PLATELET
Abs Immature Granulocytes: 0.01 10*3/uL (ref 0.00–0.07)
Basophils Absolute: 0.1 10*3/uL (ref 0.0–0.1)
Basophils Relative: 1 %
Eosinophils Absolute: 0.3 10*3/uL (ref 0.0–0.5)
Eosinophils Relative: 3 %
HCT: 36.2 % — ABNORMAL LOW (ref 39.0–52.0)
Hemoglobin: 11.5 g/dL — ABNORMAL LOW (ref 13.0–17.0)
Immature Granulocytes: 0 %
Lymphocytes Relative: 38 %
Lymphs Abs: 3.3 10*3/uL (ref 0.7–4.0)
MCH: 32.3 pg (ref 26.0–34.0)
MCHC: 31.8 g/dL (ref 30.0–36.0)
MCV: 101.7 fL — ABNORMAL HIGH (ref 80.0–100.0)
Monocytes Absolute: 0.6 10*3/uL (ref 0.1–1.0)
Monocytes Relative: 7 %
Neutro Abs: 4.4 10*3/uL (ref 1.7–7.7)
Neutrophils Relative %: 51 %
Platelets: 258 10*3/uL (ref 150–400)
RBC: 3.56 MIL/uL — ABNORMAL LOW (ref 4.22–5.81)
RDW: 14.6 % (ref 11.5–15.5)
WBC: 8.5 10*3/uL (ref 4.0–10.5)
nRBC: 0 % (ref 0.0–0.2)

## 2020-12-06 LAB — COMPREHENSIVE METABOLIC PANEL WITH GFR
ALT: 28 U/L (ref 0–44)
AST: 28 U/L (ref 15–41)
Albumin: 3.3 g/dL — ABNORMAL LOW (ref 3.5–5.0)
Alkaline Phosphatase: 56 U/L (ref 38–126)
Anion gap: 7 (ref 5–15)
BUN: 35 mg/dL — ABNORMAL HIGH (ref 8–23)
CO2: 23 mmol/L (ref 22–32)
Calcium: 9 mg/dL (ref 8.9–10.3)
Chloride: 105 mmol/L (ref 98–111)
Creatinine, Ser: 1.68 mg/dL — ABNORMAL HIGH (ref 0.61–1.24)
GFR, Estimated: 40 mL/min — ABNORMAL LOW
Glucose, Bld: 122 mg/dL — ABNORMAL HIGH (ref 70–99)
Potassium: 4.2 mmol/L (ref 3.5–5.1)
Sodium: 135 mmol/L (ref 135–145)
Total Bilirubin: 0.4 mg/dL (ref 0.3–1.2)
Total Protein: 6.8 g/dL (ref 6.5–8.1)

## 2020-12-06 LAB — MAGNESIUM: Magnesium: 2.1 mg/dL (ref 1.7–2.4)

## 2020-12-06 NOTE — ED Provider Notes (Signed)
[facility] EMERGENCY DEPARTMENT Provider Note  CSN: [id number] Arrival date & time: 12/06/20 2142  Chief Complaint(s) sinus pause  HPI Philip Kerr is a 87 y.o. male with a past medical history listed below recently admitted for high-grade heart block currently wearing a Zio patch here at the recommendation of the on-call cardiologist for having a 7-second pause earlier today.  The incident occurred around 6:45 PM.  Patient reports that he was in the shower during that time.  He denied any associated symptoms including chest pain, shortness of breath, lightheadedness, syncope.  Currently has no physical complaints.   HPI  Past Medical History Past Medical History:  Diagnosis Date   Aortic insufficiency    Coronary artery disease    coronary artery bypass graft x 5 in 1996   Diabetes mellitus    Hypercholesterolemia    non-insulin dependent   Hypertension    Ischemic cardiomyopathy    Mitral regurgitation    PVC (premature ventricular contraction)    Patient Active Problem List   Diagnosis Date Noted   Right sided temporal headache 11/03/2020   Subclinical hypothyroidism 11/03/2020   Urinary retention 08/12/2020   NSVT (nonsustained ventricular tachycardia)    Nonspecific abnormal electrocardiogram (ECG) (EKG)    Acute CHF (congestive heart failure) (HCC)    AKI (acute kidney injury) ([place])    Community acquired pneumonia of left lower lobe of lung    Chest pain 07/22/2020   Lab test positive for detection of COVID-19 virus 07/22/2020   Chronic kidney disease, stage 3a ([place]) 12/24/2019   Scalp laceration, subsequent encounter 05/30/2019   Slow urinary stream 09/18/2017   Dizzy 09/18/2017   Dementia with behavioral disturbance 09/18/2017   Low testosterone in male 09/18/2017   Depression 09/18/2017   Anemia 09/18/2017   Vitamin D deficiency 09/18/2017   Weight loss 09/18/2017   Fatigue 09/06/2017   RLS (restless legs syndrome) 08/27/2017   Rash  08/27/2017   Dysuria 08/23/2017   Insomnia 08/23/2017   Encounter for well adult exam with abnormal findings 54/62/7035   Chronic systolic CHF (congestive heart failure) ([place]) 06/13/2012   Murmur 04/21/2012   Coronary artery disease 04/05/2011   Diabetes mellitus type II, non insulin dependent ([place]) 01/21/2009   Essential hypertension 01/21/2009   HYPERCHOLESTEROLEMIA 01/20/2009   CAD, ARTERY BYPASS GRAFT 01/20/2009   Home Medication(s) Prior to Admission medications   Medication Sig Start Date End Date Taking? Authorizing Provider  amiodarone (PACERONE) 200 MG tablet Take 1 tablet (200 mg total) by mouth daily. 09/01/20   Loel Dubonnet, NP  aspirin EC 81 MG tablet Take 81 mg by mouth every morning. Swallow whole.    [provider]  Cholecalciferol (VITAMIN D3 PO) Take 1 capsule by mouth at bedtime.    [provider]  furosemide (LASIX) 20 MG tablet Take 1 tablet (20 mg total) by mouth as needed. 11/14/20 02/12/21  Loel Dubonnet, NP  oxybutynin (DITROPAN) 5 MG tablet TAKE 1 TABLET EVERY 8 HOURS AS NEEDED FOR UP TO 14 DAYS FOR BLADDER SPASMS. 09/08/20   Biagio Borg, MD  QUEtiapine (SEROQUEL) 50 MG tablet Take 1 tablet (50 mg total) by mouth at bedtime. 11/03/20   Biagio Borg, MD  rosuvastatin (CRESTOR) 20 MG tablet Take 1 tablet (20 mg total) by mouth daily. 09/01/20   Loel Dubonnet, NP  traZODone (DESYREL) 50 MG tablet TAKE 1/2 TO 1 TABLET BY MOUTH AT BEDTIME AS NEEDED FOR SLEEP 12/03/20  Biagio Borg, MD  vitamin E 180 MG (400 UNITS) capsule Take 400 Units by mouth daily.    [provider]                                                                                                                                    Past Surgical History Past Surgical History:  Procedure Laterality Date   CORONARY ARTERY BYPASS GRAFT     LUNG SURGERY     OTHER SURGICAL HISTORY     percutaneous coronary intervention of the  posterolateral segment on 01/27/1998    Family History Family History  Problem Relation Age of Onset   CAD Neg Hx     Social History Social History   Tobacco Use   Smoking status: Never   Smokeless tobacco: Never  Vaping Use   Vaping Use: Never used  Substance Use Topics   Alcohol use: No   Drug use: No   Allergies Patient has no known allergies.  Review of Systems Review of Systems All other systems are reviewed and are negative for acute change except as noted in the HPI  Physical Exam Vital Signs  I have reviewed the triage vital signs BP (!) 142/54   Pulse (!) 58   Temp 98.4 F (36.9 C)   Resp 18   SpO2 100%   Physical Exam Vitals reviewed.  Constitutional:      General: He is not in acute distress.    Appearance: He is well-developed. He is not diaphoretic.  HENT:     Head: Normocephalic and atraumatic.     Nose: Nose normal.  Eyes:     General: No scleral icterus.       Right eye: No discharge.        Left eye: No discharge.     Conjunctiva/sclera: Conjunctivae normal.     Pupils: Pupils are equal, round, and reactive to light.  Cardiovascular:     Rate and Rhythm: Regular rhythm. Bradycardia present.     Heart sounds: No murmur heard.   No friction rub. No gallop.  Pulmonary:     Effort: Pulmonary effort is normal. No respiratory distress.     Breath sounds: Normal breath sounds. No stridor. No rales.  Chest:    Abdominal:     General: There is no distension.     Palpations: Abdomen is soft.     Tenderness: There is no abdominal tenderness.  Musculoskeletal:        General: No tenderness.     Cervical back: Normal range of motion and neck supple.  Skin:    General: Skin is warm and dry.     Findings: No erythema or rash.  Neurological:     Mental Status: He is alert and oriented to person, place, and time.    ED Results and Treatments Labs (all labs ordered are listed, but only abnormal results are displayed) Labs  Reviewed  CBC WITH DIFFERENTIAL/PLATELET - Abnormal;  Notable for the following components:      Result Value   RBC 3.56 (*)    Hemoglobin 11.5 (*)    HCT 36.2 (*)    MCV 101.7 (*)    All other components within normal limits  COMPREHENSIVE METABOLIC PANEL - Abnormal; Notable for the following components:   Glucose, Bld 122 (*)    BUN 35 (*)    Creatinine, Ser 1.68 (*)    Albumin 3.3 (*)    GFR, Estimated 40 (*)    All other components within normal limits  RESP PANEL BY RT-PCR (FLU A&B, COVID) [id number]  MAGNESIUM                                                                                                                         EKG  EKG Interpretation  Date/Time:  Tuesday December 06 2020 23:29:50 EST Ventricular Rate:  55 PR Interval:  222 QRS Duration: 182 QT Interval:  504 QTC Calculation: 483 R Axis:   -43 Text Interpretation: Sinus rhythm Prolonged PR interval IVCD, consider atypical RBBB LVH with IVCD and secondary repol abnrm Borderline prolonged QT interval No significant change since last tracing Confirmed by Addison Lank [phone]) on 12/06/2020 11:48:36 PM       Radiology DG Chest 2 View  Result Date: 12/06/2020 CLINICAL DATA:  CHF EXAM: CHEST - 2 VIEW COMPARISON:  07/31/2020 FINDINGS: Unchanged elevation of the left hemidiaphragm. No overt pulmonary edema. Bibasilar atelectasis. Mild cardiomegaly, status post CABG. IMPRESSION: Cardiomegaly without overt pulmonary edema. Electronically Signed   By: Ulyses Jarred M.D.   On: 12/06/2020 23:56    Pertinent labs & imaging results that were available during my care of the patient were reviewed by me and considered in my medical decision making (see MDM for details).  Medications Ordered in ED Medications - No data to display                                                                                                                                   Procedures .1-3 Lead EKG Interpretation Performed by: Fatima Blank, MD Authorized by: Fatima Blank, MD     Interpretation: normal     ECG rate:  57   ECG rate assessment: bradycardic     Rhythm: sinus rhythm     Ectopy: none  Conduction: normal    (including critical care time)  Medical Decision Making / ED Course I have reviewed the nursing notes for this encounter and the patient's prior records (if available in EHR or on provided paperwork).  ROBERT SPERL was evaluated in Emergency Department on 12/07/2020 for the symptoms described in the history of present illness. He was evaluated in the context of the global COVID-19 pandemic, which necessitated consideration that the patient might be at risk for infection with the SARS-CoV-2 virus that causes COVID-19. Institutional protocols and algorithms that pertain to the evaluation of patients at risk for COVID-19 are in a state of rapid change based on information released by regulatory bodies including the CDC and federal and state organizations. These policies and algorithms were followed during the patient's care in the ED.     Currently asymptomatic. Bradycardic. Normotensive. Screening labs obtained and grossly reassuring without significant electrolyte derangement. Improved renal function from several months ago. EKG without acute ischemic changes, dysrhythmias, blocks.  Pertinent labs & imaging results that were available during my care of the patient were reviewed by me and considered in my medical decision making:  Case discussed with cardiology.  They recommended admission.  They will come and speak with the patient and assess him in the emergency department.  Final Clinical Impression(s) / ED Diagnoses Final diagnoses:  Sinus pause     This chart was dictated using voice recognition software.  Despite best efforts to proofread,  errors can occur which can change the documentation meaning.    Fatima Blank, MD 12/07/20 [phone]

## 2020-12-06 NOTE — ED Triage Notes (Signed)
Pt was sent from provider b/c his cardia monitor indicated he has a 7second pause in heart rate.  No issues reported at this time.  "I want to go home."

## 2020-12-06 NOTE — ED Provider Notes (Signed)
Emergency Medicine Provider Triage Evaluation Note  ORIEL RUMBOLD , a 87 y.o. male  was evaluated in triage.  Pt advised to come to the ED by his cardiologist after his event monitor showed a 6.9 second pause. Patient's monitor was apparently placed following a few falls at home. He denies CP, SOB, lightheadedness, syncope today. States he wants "to go home".  Review of Systems  Positive: As above Negative: As above  Physical Exam  BP (!) 142/54   Pulse (!) 58   Temp 98.4 F (36.9 C)   Resp 18   SpO2 100%  Gen:   Awake, no distress   Resp:  Normal effort  MSK:   Moves extremities without difficulty  Other:  Heart rate bradycardic, regular.  Medical Decision Making  Medically screening exam initiated at 10:30 PM.  Appropriate orders placed.  HEAVEN WANDELL was informed that the remainder of the evaluation will be completed by another provider, this initial triage assessment does not replace that evaluation, and the importance of remaining in the ED until their evaluation is complete.  Plan for labs, Mg, EKG, CXR. COVID/Flu ordered given potential for admission.   Antonietta Breach, PA-C 12/06/20 2232    Margette Fast, MD 12/12/20 1028

## 2020-12-06 NOTE — Telephone Encounter (Signed)
I received a page at 8:21 PM regarding a critical EKG finding for patient Philip Kerr.  I called [employer] regarding this finding and they informed me that the patient experienced a 6.9 second pause at 6:44 PM.  I called the patient and his wife who provided his history.  He reports that he has been feeling intermittently dizzy and falling outs for some time now.  This prompted placement of an event monitor.  He was not able to qualify if he was having any symptoms around the time of his 6.9-second pause; however, given the length of the pauses and his prior symptoms I instructed the patient to come to the ED for evaluation and consideration of PPM.  I instructed the patient not to drive himself and to have a family member drive him.  The patient and his wife verbalized understanding and plan to proceed to the ED for admission.

## 2020-12-07 ENCOUNTER — Encounter (HOSPITAL_COMMUNITY)
Admission: EM | Disposition: A | Discharge status: Home / Self Care | Payer: Self-pay | Source: Home / Self Care | Attending: Cardiology

## 2020-12-07 DIAGNOSIS — N1831 Chronic kidney disease, stage 3a: Secondary | ICD-10-CM | POA: Diagnosis present

## 2020-12-07 DIAGNOSIS — I495 Sick sinus syndrome: Principal | ICD-10-CM

## 2020-12-07 DIAGNOSIS — I255 Ischemic cardiomyopathy: Secondary | ICD-10-CM | POA: Diagnosis present

## 2020-12-07 DIAGNOSIS — I5022 Chronic systolic (congestive) heart failure: Secondary | ICD-10-CM | POA: Diagnosis not present

## 2020-12-07 DIAGNOSIS — E1122 Type 2 diabetes mellitus with diabetic chronic kidney disease: Secondary | ICD-10-CM | POA: Diagnosis not present

## 2020-12-07 DIAGNOSIS — Z7982 Long term (current) use of aspirin: Secondary | ICD-10-CM | POA: Diagnosis not present

## 2020-12-07 DIAGNOSIS — I440 Atrioventricular block, first degree: Secondary | ICD-10-CM | POA: Diagnosis present

## 2020-12-07 DIAGNOSIS — I455 Other specified heart block: Secondary | ICD-10-CM

## 2020-12-07 DIAGNOSIS — Z951 Presence of aortocoronary bypass graft: Secondary | ICD-10-CM | POA: Diagnosis not present

## 2020-12-07 DIAGNOSIS — Z79899 Other long term (current) drug therapy: Secondary | ICD-10-CM | POA: Diagnosis not present

## 2020-12-07 DIAGNOSIS — I472 Ventricular tachycardia, unspecified: Secondary | ICD-10-CM | POA: Diagnosis not present

## 2020-12-07 DIAGNOSIS — G2581 Restless legs syndrome: Secondary | ICD-10-CM | POA: Diagnosis not present

## 2020-12-07 DIAGNOSIS — E559 Vitamin D deficiency, unspecified: Secondary | ICD-10-CM | POA: Diagnosis present

## 2020-12-07 DIAGNOSIS — I2510 Atherosclerotic heart disease of native coronary artery without angina pectoris: Secondary | ICD-10-CM | POA: Diagnosis present

## 2020-12-07 DIAGNOSIS — Z8616 Personal history of COVID-19: Secondary | ICD-10-CM | POA: Diagnosis not present

## 2020-12-07 DIAGNOSIS — I130 Hypertensive heart and chronic kidney disease with heart failure and stage 1 through stage 4 chronic kidney disease, or unspecified chronic kidney disease: Secondary | ICD-10-CM | POA: Diagnosis not present

## 2020-12-07 DIAGNOSIS — E7800 Pure hypercholesterolemia, unspecified: Secondary | ICD-10-CM | POA: Diagnosis present

## 2020-12-07 HISTORY — PX: PACEMAKER IMPLANT: EP1218

## 2020-12-07 HISTORY — DX: Other specified heart block: I45.5

## 2020-12-07 LAB — TYPE AND SCREEN
ABO/RH(D): A POS
Antibody Screen: NEGATIVE

## 2020-12-07 LAB — GLUCOSE, CAPILLARY: Glucose-Capillary: 108 mg/dL — ABNORMAL HIGH (ref 70–99)

## 2020-12-07 LAB — RESP PANEL BY RT-PCR (FLU A&B, COVID) ARPGX2
Influenza A by PCR: NEGATIVE
Influenza B by PCR: NEGATIVE
SARS Coronavirus 2 by RT PCR: NEGATIVE

## 2020-12-07 LAB — PROTIME-INR
INR: 1.2 (ref 0.8–1.2)
Prothrombin Time: 15.1 seconds (ref 11.4–15.2)

## 2020-12-07 LAB — APTT: aPTT: 31 seconds (ref 24–36)

## 2020-12-07 LAB — CBG MONITORING, ED: Glucose-Capillary: 99 mg/dL (ref 70–99)

## 2020-12-07 SURGERY — PACEMAKER IMPLANT

## 2020-12-07 MED ORDER — ONDANSETRON HCL 4 MG/2ML IJ SOLN: 4.0000 mg | Freq: Four times a day (QID) | INTRAMUSCULAR | Status: DC | PRN

## 2020-12-07 MED ORDER — SODIUM CHLORIDE 0.9 % IV SOLN
80.0000 mg | INTRAVENOUS | Status: AC
  Administered 2020-12-07: 80 mg
  Filled 2020-12-07: qty 2

## 2020-12-07 MED ORDER — SODIUM CHLORIDE 0.9 % IV SOLN: INTRAVENOUS | Status: DC

## 2020-12-07 MED ORDER — SODIUM CHLORIDE 0.9 % IV SOLN
INTRAVENOUS | Status: AC
  Filled 2020-12-07: qty 2

## 2020-12-07 MED ORDER — AMIODARONE HCL 200 MG PO TABS
200.0000 mg | ORAL_TABLET | Freq: Every day | ORAL | Status: DC
  Administered 2020-12-07 – 2020-12-08 (×2): 200 mg via ORAL
  Filled 2020-12-07 (×2): qty 1

## 2020-12-07 MED ORDER — TRAZODONE HCL 50 MG PO TABS
25.0000 mg | ORAL_TABLET | Freq: Every evening | ORAL | Status: DC | PRN
Start: 2020-12-07 — End: 2020-12-08
  Administered 2020-12-08: 25 mg via ORAL
  Filled 2020-12-07: qty 1

## 2020-12-07 MED ORDER — CHLORHEXIDINE GLUCONATE 4 % EX LIQD
60.0000 mL | Freq: Once | CUTANEOUS | Status: DC
  Filled 2020-12-07: qty 60

## 2020-12-07 MED ORDER — ASPIRIN EC 81 MG PO TBEC
81.0000 mg | DELAYED_RELEASE_TABLET | Freq: Every morning | ORAL | Status: DC
  Administered 2020-12-07 – 2020-12-08 (×2): 81 mg via ORAL
  Filled 2020-12-07 (×2): qty 1

## 2020-12-07 MED ORDER — QUETIAPINE FUMARATE 50 MG PO TABS
50.0000 mg | ORAL_TABLET | Freq: Every day | ORAL | Status: DC
  Administered 2020-12-07: 50 mg via ORAL
  Filled 2020-12-07 (×2): qty 1

## 2020-12-07 MED ORDER — ROSUVASTATIN CALCIUM 20 MG PO TABS
20.0000 mg | ORAL_TABLET | Freq: Every day | ORAL | Status: DC
  Administered 2020-12-07 – 2020-12-08 (×2): 20 mg via ORAL
  Filled 2020-12-07 (×2): qty 1

## 2020-12-07 MED ORDER — HEPARIN (PORCINE) IN NACL 1000-0.9 UT/500ML-% IV SOLN
INTRAVENOUS | Status: DC | PRN
  Administered 2020-12-07: 500 mL

## 2020-12-07 MED ORDER — SODIUM CHLORIDE 0.9 % IV SOLN
INTRAVENOUS | Status: DC | PRN
  Administered 2020-12-07: 500 mL

## 2020-12-07 MED ORDER — CEFAZOLIN SODIUM-DEXTROSE 2-4 GM/100ML-% IV SOLN
2.0000 g | INTRAVENOUS | Status: AC
  Administered 2020-12-07: 2 g via INTRAVENOUS

## 2020-12-07 MED ORDER — ACETAMINOPHEN 325 MG PO TABS: 650.0000 mg | ORAL_TABLET | ORAL | Status: DC | PRN

## 2020-12-07 MED ORDER — CEFAZOLIN SODIUM-DEXTROSE 1-4 GM/50ML-% IV SOLN
1.0000 g | Freq: Four times a day (QID) | INTRAVENOUS | Status: AC
  Administered 2020-12-07 – 2020-12-08 (×3): 1 g via INTRAVENOUS
  Filled 2020-12-07 (×3): qty 50

## 2020-12-07 MED ORDER — HEPARIN (PORCINE) IN NACL 1000-0.9 UT/500ML-% IV SOLN
INTRAVENOUS | Status: AC
  Filled 2020-12-07: qty 500

## 2020-12-07 MED ORDER — LIDOCAINE HCL (PF) 1 % IJ SOLN
INTRAMUSCULAR | Status: DC | PRN
  Administered 2020-12-07: 60 mL

## 2020-12-07 MED ORDER — INSULIN ASPART 100 UNIT/ML IJ SOLN: 0.0000 [IU] | Freq: Three times a day (TID) | INTRAMUSCULAR | Status: DC

## 2020-12-07 MED ORDER — CEFAZOLIN SODIUM-DEXTROSE 2-4 GM/100ML-% IV SOLN
INTRAVENOUS | Status: AC
  Filled 2020-12-07: qty 100

## 2020-12-07 MED ORDER — LIDOCAINE HCL (PF) 1 % IJ SOLN
INTRAMUSCULAR | Status: AC
  Filled 2020-12-07: qty 90

## 2020-12-07 SURGICAL SUPPLY — 9 items
CABLE SURGICAL S-101-97-12 (CABLE) ×2 IMPLANT
IPG PACE AZUR XT DR MRI W1DR01 (Pacemaker) IMPLANT
LEAD CAPSURE NOVUS 5076-52CM (Lead) ×1 IMPLANT
LEAD CAPSURE NOVUS 5076-58CM (Lead) ×1 IMPLANT
PACE AZURE XT DR MRI W1DR01 (Pacemaker) ×2 IMPLANT
PAD DEFIB RADIO PHYSIO CONN (PAD) ×2 IMPLANT
SHEATH 7FR PRELUDE SNAP 13 (SHEATH) ×2 IMPLANT
SHEATH PROBE COVER 6X72 (BAG) ×1 IMPLANT
TRAY PACEMAKER INSERTION (PACKS) ×2 IMPLANT

## 2020-12-07 NOTE — H&P (View-Only) (Signed)
ELECTROPHYSIOLOGY CONSULT NOTE    Patient ID: Philip Kerr MRN: [id number], DOB/AGE: June 13, 1933 87 y.o.  Admit date: 12/06/2020 Date of Consult: 12/07/2020  Primary Physician: Biagio Borg, MD Primary Cardiologist: Lauree Chandler, MD  Electrophysiologist: Dr. Curt Bears  Referring Provider: Dr. Conley Canal  Patient Profile: Philip Kerr is a 87 y.o. 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  HPI:  Philip Kerr is a 87 y.o. male with medical history as above.   Philip Kerr was previously hospitalized at [facility] from 07/22/2020 -08/08/2020 for symptomatic COVID 19 pneumonia c/b prolonged delirium and AKI.  During that hospitalization he was noted to be in A/C CHF and BB started for GDMT. He developed high degree AV block with an 8-second pause on 7/27.  Additionally he developed a wide-complex tachycardia that was favored to be NSVT.  Given the sinus pauses his metoprolol was discontinued and he was instead started on an amiodarone load for suppression of NSVT.  He was later discharged home.   On 11/25/2020 the patient had a phone conversation with one of the nurses from the cardiovascular division and reported intermittent dizzy spells x 1 month. As result of this conversation Dr. Sallyanne Kuster ordered a 2-week event monitor for evaluation of arrhythmias. On 11/29, I received a page at 8:21 PM regarding a critical EKG finding for patient Mr. Philip Kerr. [employer] was called regarding this finding and they informed me that the patient experienced a 6.9 second pause at 6:44 PM. Fellow called the patient and instructed him to come to the ED.   On arrival to the ED the patient's VS were afebrile, HR 58, BP 142/54, RR 18, satting 100% on RA.  Labs were all at the patient's baseline.  EKG was largely unchanged from prior with at least bifascicular block at baseline.    Pt admitted for further evaluation and for EP to see in am.   Initially, pt had been refusing pacing so he could be home for his wife's birthday. He was largely receptive to pacing even at the start of our conversation. [name] birthday is Friday.  History as above. Pt cant remember why monitor was ordered, but does report intermittent dizziness. No Renard syncope.  He denies CP, edema, or SOB.   Past Medical History:  Diagnosis Date   Aortic insufficiency    Coronary artery disease    coronary artery bypass graft x 5 in 1996   Diabetes mellitus    Hypercholesterolemia    non-insulin dependent   Hypertension    Ischemic cardiomyopathy    Mitral regurgitation    PVC (premature ventricular contraction)      Surgical History:  Past Surgical History:  Procedure Laterality Date   CORONARY ARTERY BYPASS GRAFT     LUNG SURGERY     OTHER SURGICAL HISTORY     percutaneous coronary intervention of the  posterolateral segment on 01/27/1998     (Not in a hospital admission)   Inpatient Medications:   amiodarone  200 mg Oral Daily   aspirin EC  81 mg Oral q morning   insulin aspart  0-4 Units Subcutaneous TID WC   QUEtiapine  50 mg Oral QHS   rosuvastatin  20 mg Oral Daily    Allergies: No Known Allergies  Social History   Socioeconomic History   Marital status: Married    Spouse name: Not  on file   Number of children: 1   Years of education: Not on file   Highest education level: Not on file  Occupational History   Occupation: Runs a Acupuncturist: SELF-EMPLOYED  Tobacco Use   Smoking status: Never   Smokeless tobacco: Never  Vaping Use   Vaping Use: Never used  Substance and Sexual Activity   Alcohol use: No   Drug use: No   Sexual activity: Not Currently  Other Topics Concern   Not on file  Social History Narrative   Not on file   Social Determinants of Health   Financial Resource Strain: Not on file  Food Insecurity: Not on file  Transportation  Needs: Not on file  Physical Activity: Not on file  Stress: Not on file  Social Connections: Not on file  Intimate Partner Violence: Not on file     Family History  Problem Relation Age of Onset   CAD Neg Hx      Review of Systems: All other systems reviewed and are otherwise negative except as noted above.  Physical Exam: Vitals:   12/07/20 0400 12/07/20 0415 12/07/20 0615 12/07/20 0845  BP: 102/62 120/72 107/73 (!) 125/56  Pulse: 61 60 (!) 58 (!) 59  Resp: 15 15 16 20   Temp:      SpO2: 95% 90% 99% 100%    GEN- The patient is well appearing, alert and oriented x 3 today.   HEENT: normocephalic, atraumatic; sclera clear, conjunctiva pink; hearing intact; oropharynx clear; neck supple Lungs- Clear to ausculation bilaterally, normal work of breathing.  No wheezes, rales, rhonchi Heart- Regular rate and rhythm, no murmurs, rubs or gallops GI- soft, non-tender, non-distended, bowel sounds present Extremities- no clubbing, cyanosis, or edema; DP/PT/radial pulses 2+ bilaterally MS- no significant deformity or atrophy Skin- warm and dry, no rash or lesion Psych- euthymic mood, full affect Neuro- strength and sensation are intact  Labs:   Lab Results  Component Value Date   WBC 8.5 12/06/2020   HGB 11.5 (L) 12/06/2020   HCT 36.2 (L) 12/06/2020   MCV 101.7 (H) 12/06/2020   PLT 258 12/06/2020    Recent Labs  Lab 12/06/20 2237  NA 135  K 4.2  CL 105  CO2 23  BUN 35*  CREATININE 1.68*  CALCIUM 9.0  PROT 6.8  BILITOT 0.4  ALKPHOS 56  ALT 28  AST 28  GLUCOSE 122*      Radiology/Studies: DG Chest 2 View  Result Date: 12/06/2020 CLINICAL DATA:  CHF EXAM: CHEST - 2 VIEW COMPARISON:  07/31/2020 FINDINGS: Unchanged elevation of the left hemidiaphragm. No overt pulmonary edema. Bibasilar atelectasis. Mild cardiomegaly, status post CABG. IMPRESSION: Cardiomegaly without overt pulmonary edema. Electronically Signed   By: Ulyses Jarred M.D.   On: 12/06/2020 23:56     OEV:OJJKK bradycardia at 57 bpm with 1st degree AV block and IVCD (? Atypical RBBB?) at 170 ms (personally reviewed)  TELEMETRY: Sinus Brady/NSR 50-60s (personally reviewed)  Assessment/Plan: 1.  SND/Sinus pause With baseline conduction disease of at least bifascicular block Symptomatic pauses with dizziness and lightheadedness.  Zio patch with >6 second pause He is on amiodarone for h/o NSVT, but unlikely to wash out. Of note, he had NSVT/WCT in 07/2020, BB was stopped due to advanced AV block with 8 second pause. He [place] require amio and/or BB for his CHF.  Explained risks, benefits, and alternatives to PPM implantation, including but not limited to bleeding, infection, pneumothorax, pericardial effusion, lead  dislodgement, heart attack, stroke, or death.  Pt verbalized understanding and agrees to proceed  Placed on board for this afternoon  2. CAD s/p remote CABG Denies s/s ischemia Pt prefers conservative approach  3. HFrEF 2019 LVEF 25%. 07/31/20 LVEF 45-50%, moderate MR, mild to moderate TR.  Intolerant to BB (Bradycardia).  No ACE/ARB/ARNI/MRA due to ARF admission 07/2020. May be able to consider as outpatient pending following Cr post pacer. Should be able to add low dose BB post pacer.   Dr. Curt Bears has seen. Plan pacing this afternoon.   For questions or updates, please contact [place] Please consult [email or web address] for contact info under Cardiology/STEMI.  Signed, Shirley Friar, PA-C  12/07/2020 9:17 AM      I have seen and examined this patient with Oda Kilts.  Agree with above, note added to reflect my findings.  Patient has a history of severe dizzy episodes.  He was seen by cardiology and a 2-week monitor was ordered.  Last night, he was noted to have a 6.9-second pause while awake.  He was instructed to come to the emergency room.  In the emergency room he has done well.  He has had no further pauses.  He states that he did not have any dizziness  when he had his pause last night, though he is a poor historian.  GEN: Well nourished, well developed, in no acute distress  HEENT: normal  Neck: no JVD, carotid bruits, or masses Cardiac: RRR; no murmurs, rubs, or gallops,no edema  Respiratory:  clear to auscultation bilaterally, normal work of breathing GI: soft, nontender, nondistended, + BS MS: no deformity or atrophy  Skin: warm and dry Neuro:  Strength and sensation are intact Psych: euthymic mood, full affect  1.  Sinus pauses: Likely cause of his dizziness.  If these continue, he would certainly be at risk of syncope.  Due to that, we Selma Mink plan for pacemaker implant.  Risk and benefits were discussed.  The patient understands and has agreed to the procedure.  2.  Nonsustained ventricular tachycardia: Continue amiodarone.  Lakeena Downie M. Kaislee Chao MD 12/07/2020 10:53 AM

## 2020-12-07 NOTE — ED Notes (Signed)
Pt has called RN into room several times to state he does not want to wait any longer, "this is taking too long," and that "those doctors are liars." RN updated pt on POC and positioned for comfort as able. Pt remains agitated and verbally aggressive towards staff. RN sent secure chat to Dr Curt Bears with this update, awaiting response.

## 2020-12-07 NOTE — Consult Note (Addendum)
ELECTROPHYSIOLOGY CONSULT NOTE    Patient ID: [name] MRN: [id number], DOB/AGE: 85-Feb-1935 85 y.o.  Admit date: 12/06/2020 Date of Consult: 12/07/2020  Primary Physician: Biagio Borg, MD Primary Cardiologist: Lauree Chandler, MD  Electrophysiologist: Dr. Curt Bears  Referring Provider: Dr. Conley Canal  Patient Profile: Philip Kerr is a 87 y.o. 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  HPI:  Philip Kerr is a 87 y.o. male with medical history as above.   Philip Kerr was previously hospitalized at [facility] from 07/22/2020 -08/08/2020 for symptomatic COVID 19 pneumonia c/b prolonged delirium and AKI.  During that hospitalization he was noted to be in A/C CHF and BB started for GDMT. He developed high degree AV block with an 8-second pause on 7/27.  Additionally he developed a wide-complex tachycardia that was favored to be NSVT.  Given the sinus pauses his metoprolol was discontinued and he was instead started on an amiodarone load for suppression of NSVT.  He was later discharged home.   On 11/25/2020 the patient had a phone conversation with one of the nurses from the cardiovascular division and reported intermittent dizzy spells x 1 month. As result of this conversation Dr. Sallyanne Kuster ordered a 2-week event monitor for evaluation of arrhythmias. On 11/29, I received a page at 8:21 PM regarding a critical EKG finding for patient Mr. Melucci. [facility] was called regarding this finding and they informed me that the patient experienced a 6.9 second pause at 6:44 PM. Fellow called the patient and instructed him to come to the ED.   On arrival to the ED the patient's VS were afebrile, HR 58, BP 142/54, RR 18, satting 100% on RA.  Labs were all at the patient's baseline.  EKG was largely unchanged from prior with at least bifascicular block at baseline.    Pt admitted for further evaluation and for EP to see in am.   Initially, pt had been refusing pacing so he could be home for his wife's birthday. He was largely receptive to pacing even at the start of our conversation. [name] birthday is Friday.  History as above. Pt cant remember why monitor was ordered, but does report intermittent dizziness. No Jw syncope.  He denies CP, edema, or SOB.   Past Medical History:  Diagnosis Date   Aortic insufficiency    Coronary artery disease    coronary artery bypass graft x 5 in 1996   Diabetes mellitus    Hypercholesterolemia    non-insulin dependent   Hypertension    Ischemic cardiomyopathy    Mitral regurgitation    PVC (premature ventricular contraction)      Surgical History:  Past Surgical History:  Procedure Laterality Date   CORONARY ARTERY BYPASS GRAFT     LUNG SURGERY     OTHER SURGICAL HISTORY     percutaneous coronary intervention of the  posterolateral segment on 01/27/1998     (Not in a hospital admission)   Inpatient Medications:   amiodarone  200 mg Oral Daily   aspirin EC  81 mg Oral q morning   insulin aspart  0-4 Units Subcutaneous TID WC   QUEtiapine  50 mg Oral QHS   rosuvastatin  20 mg Oral Daily    Allergies: No Known Allergies  Social History   Socioeconomic History   Marital status: Married    Spouse name: Not  on file   Number of children: 1   Years of education: Not on file   Highest education level: Not on file  Occupational History   Occupation: Runs a Acupuncturist: SELF-EMPLOYED  Tobacco Use   Smoking status: Never   Smokeless tobacco: Never  Vaping Use   Vaping Use: Never used  Substance and Sexual Activity   Alcohol use: No   Drug use: No   Sexual activity: Not Currently  Other Topics Concern   Not on file  Social History Narrative   Not on file   Social Determinants of Health   Financial Resource Strain: Not on file  Food Insecurity: Not on file  Transportation  Needs: Not on file  Physical Activity: Not on file  Stress: Not on file  Social Connections: Not on file  Intimate Partner Violence: Not on file     Family History  Problem Relation Age of Onset   CAD Neg Hx      Review of Systems: All other systems reviewed and are otherwise negative except as noted above.  Physical Exam: Vitals:   12/07/20 0400 12/07/20 0415 12/07/20 0615 12/07/20 0845  BP: 102/62 120/72 107/73 (!) 125/56  Pulse: 61 60 (!) 58 (!) 59  Resp: 15 15 16 20   Temp:      SpO2: 95% 90% 99% 100%    GEN- The patient is well appearing, alert and oriented x 3 today.   HEENT: normocephalic, atraumatic; sclera clear, conjunctiva pink; hearing intact; oropharynx clear; neck supple Lungs- Clear to ausculation bilaterally, normal work of breathing.  No wheezes, rales, rhonchi Heart- Regular rate and rhythm, no murmurs, rubs or gallops GI- soft, non-tender, non-distended, bowel sounds present Extremities- no clubbing, cyanosis, or edema; DP/PT/radial pulses 2+ bilaterally MS- no significant deformity or atrophy Skin- warm and dry, no rash or lesion Psych- euthymic mood, full affect Neuro- strength and sensation are intact  Labs:   Lab Results  Component Value Date   WBC 8.5 12/06/2020   HGB 11.5 (L) 12/06/2020   HCT 36.2 (L) 12/06/2020   MCV 101.7 (H) 12/06/2020   PLT 258 12/06/2020    Recent Labs  Lab 12/06/20 2237  NA 135  K 4.2  CL 105  CO2 23  BUN 35*  CREATININE 1.68*  CALCIUM 9.0  PROT 6.8  BILITOT 0.4  ALKPHOS 56  ALT 28  AST 28  GLUCOSE 122*      Radiology/Studies: DG Chest 2 View  Result Date: 12/06/2020 CLINICAL DATA:  CHF EXAM: CHEST - 2 VIEW COMPARISON:  07/31/2020 FINDINGS: Unchanged elevation of the left hemidiaphragm. No overt pulmonary edema. Bibasilar atelectasis. Mild cardiomegaly, status post CABG. IMPRESSION: Cardiomegaly without overt pulmonary edema. Electronically Signed   By: Ulyses Jarred M.D.   On: 12/06/2020 23:56     WGN:FAOZH bradycardia at 57 bpm with 1st degree AV block and IVCD (? Atypical RBBB?) at 170 ms (personally reviewed)  TELEMETRY: Sinus Brady/NSR 50-60s (personally reviewed)  Assessment/Plan: 1.  SND/Sinus pause With baseline conduction disease of at least bifascicular block Symptomatic pauses with dizziness and lightheadedness.  Zio patch with >6 second pause He is on amiodarone for h/o NSVT, but unlikely to wash out. Of note, he had NSVT/WCT in 07/2020, BB was stopped due to advanced AV block with 8 second pause. He Tahni Porchia require amio and/or BB for his CHF.  Explained risks, benefits, and alternatives to PPM implantation, including but not limited to bleeding, infection, pneumothorax, pericardial effusion, lead  dislodgement, heart attack, stroke, or death.  Pt verbalized understanding and agrees to proceed  Placed on board for this afternoon  2. CAD s/p remote CABG Denies s/s ischemia Pt prefers conservative approach  3. HFrEF 2019 LVEF 25%. 07/31/20 LVEF 45-50%, moderate MR, mild to moderate TR.  Intolerant to BB (Bradycardia).  No ACE/ARB/ARNI/MRA due to ARF admission 07/2020. May be able to consider as outpatient pending following Cr post pacer. Should be able to add low dose BB post pacer.   Dr. Curt Bears has seen. Plan pacing this afternoon.   For questions or updates, please contact [place] Please consult [email or web address] for contact info under Cardiology/STEMI.  Signed, Shirley Friar, PA-C  12/07/2020 9:17 AM      I have seen and examined this patient with Oda Kilts.  Agree with above, note added to reflect my findings.  Patient has a history of severe dizzy episodes.  He was seen by cardiology and a 2-week monitor was ordered.  Last night, he was noted to have a 6.9-second pause while awake.  He was instructed to come to the emergency room.  In the emergency room he has done well.  He has had no further pauses.  He states that he did not have any dizziness  when he had his pause last night, though he is a poor historian.  GEN: Well nourished, well developed, in no acute distress  HEENT: normal  Neck: no JVD, carotid bruits, or masses Cardiac: RRR; no murmurs, rubs, or gallops,no edema  Respiratory:  clear to auscultation bilaterally, normal work of breathing GI: soft, nontender, nondistended, + BS MS: no deformity or atrophy  Skin: warm and dry Neuro:  Strength and sensation are intact Psych: euthymic mood, full affect  1.  Sinus pauses: Likely cause of his dizziness.  If these continue, he would certainly be at risk of syncope.  Due to that, we Kassidi Elza plan for pacemaker implant.  Risk and benefits were discussed.  The patient understands and has agreed to the procedure.  2.  Nonsustained ventricular tachycardia: Continue amiodarone.  Derrel Moore M. Debe Anfinson MD 12/07/2020 10:53 AM

## 2020-12-07 NOTE — Interval H&P Note (Signed)
History and Physical Interval Note:  12/07/2020 11:07 AM  Philip Kerr  has presented today for surgery, with the diagnosis of synus node dysfunction.  The various methods of treatment have been discussed with the patient and family. After consideration of risks, benefits and other options for treatment, the patient has consented to  Procedure(s): PACEMAKER IMPLANT (N/A) as a surgical intervention.  The patient's history has been reviewed, patient examined, no change in status, stable for surgery.  I have reviewed the patient's chart and labs.  Questions were answered to the patient's satisfaction.     Philip Kerr [employer]

## 2020-12-07 NOTE — Discharge Instructions (Signed)
After Your Pacemaker   You have a [employer]onic Pacemaker  ACTIVITY Do not lift your arm above shoulder height for 1 week after your procedure. After 7 days, you may progress as below.  You should remove your sling 24 hours after your procedure, unless otherwise instructed by your provider.     Wednesday December 14, 2020  Thursday December 15, 2020 Friday December 16, 2020 Saturday December 17, 2020   Do not lift, push, pull, or carry anything over 10 pounds with the affected arm until 6 weeks (Wednesday January 18, 2021 ) after your procedure.   You may drive AFTER your wound check, unless you have been told otherwise by your provider.   Ask your healthcare provider when you can go back to work   INCISION/Dressing If you are on a blood thinner such as Coumadin, Xarelto, Eliquis, Plavix, or Pradaxa please confirm with your provider when this should be resumed.   If large square, outer bandage is left in place, this can be removed after 24 hours from your procedure. Do not remove steri-strips or glue as below.   Monitor your Pacemaker site for redness, swelling, and drainage. Call the device clinic at [phone] if you experience these symptoms or fever/chills.  If your incision is sealed with Steri-strips or staples, you may shower 10 days after your procedure or when told by your provider. Do not remove the steri-strips or let the shower hit directly on your site. You may wash around your site with soap and water.    If you were discharged in a sling, please do not wear this during the day more than 48 hours after your surgery unless otherwise instructed. This may increase the risk of stiffness and soreness in your shoulder.   Avoid lotions, ointments, or perfumes over your incision until it is well-healed.  You may use a hot tub or a pool AFTER your wound check appointment if the incision is completely closed.  [employer]cemaker Alerts:  Some alerts are vibratory and others beep. These are  NOT emergencies. Please call our office to let us know. If this occurs at night or on weekends, it can wait until the next business day. Send a remote transmission.  If your device is capable of reading fluid status (for heart failure), you will be offered monthly monitoring to review this with you.   DEVICE MANAGEMENT Remote monitoring is used to monitor your pacemaker from home. This monitoring is scheduled every 91 days by our office. It allows [place] to keep an eye on the functioning of your device to ensure it is working properly. You will routinely see your Electrophysiologist annually (more often if necessary).   You should receive your ID card for your new device in 4-8 weeks. Keep this card with you at all times once received. Consider wearing a medical alert bracelet or necklace.  Your Pacemaker may be MRI compatible. This will be discussed at your next office visit/wound check.  You should avoid contact with strong electric or magnetic fields.   Do not use amateur (ham) radio equipment or electric (arc) welding torches. MP3 player headphones with magnets should not be used. Some devices are safe to use if held at least 12 inches (30 cm) from your Pacemaker. These include power tools, lawn mowers, and speakers. If you are unsure if something is safe to use, ask your health care provider.  When using your cell phone, hold it to the ear that is on the opposite side from the  Pacemaker. Do not leave your cell phone in a pocket over the Pacemaker.  You may safely use electric blankets, heating pads, computers, and microwave ovens.  Call the office right away if: You have chest pain. You feel more short of breath than you have felt before. You feel more light-headed than you have felt before. Your incision starts to open up.  This information is not intended to replace advice given to you by your health care provider. Make sure you discuss any questions you have with your health care provider.

## 2020-12-07 NOTE — H&P (Signed)
Cardiology Admission History and Physical:   Philip ID: Philip Kerr MRN: [id number]; DOB: 1933-06-23   Admission date: 12/06/2020  PCP:  Biagio Borg, MD   [facility] Providers Cardiologist:  Lauree Chandler, MD        Chief Complaint:  Sinus Pause  Philip Profile:   Philip Kerr is a 87 y.o. 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  History of Present Illness:   The following history was obtained via chart review, from the Philip and his family.  Philip Kerr was recently hospitalized at [facility] from 07/22/2020 -08/08/2020 for symptomatic COVID 19 pneumonia c/b prolonged delirium and AKI.  During that hospitalization he was also noted to be in decompensated heart failure and metoprolol was started as a part of optimizing his GDMT.  Sometime during his hospitalization he developed high degree AV block with an 8-second pause on 7/27.  Additionally he developed a wide-complex tachycardia that was favored to be NSVT.  Given the sinus pauses his metoprolol was discontinued and he was instead started on an amiodarone load for suppression of NSVT.  He was later discharged home.  On 11/25/2020 the Philip had a phone conversation with one of the nurses from the cardiovascular division and the Philip noted that he had been experiencing dizzy spells over the past month.  As result of this conversation Dr. Sallyanne Kuster ordered a 2-week event monitor for evaluation of arrhythmias. On 11/29, I received a page at 8:21 PM regarding a critical EKG finding for Philip Kerr.  I called [employer] regarding this finding and they informed me that the Philip experienced a 6.9 second pause at 6:44 PM.  I called the Philip and instructed him to come to the ED.  On arrival to the ED the Philip's VS were afebrile, HR 58, BP 142/54, RR 18, satting 100% on RA.   Labs were all at the Philip's baseline.  EKG was largely unchanged from prior.  On my examination the Philip the Philip denies having any symptoms and further denies having any dizzy spells over the past month despite the documented phone conversation from 11/18.  He repeatedly stated that " he just wants to go home" and that "he does not want a pacemaker this week."  After I discussed the risks of going home without additional monitoring, the Philip ultimately agreed to admission.  He denies chest pain, shortness of breath, PND, orthopnea, swelling, palpitations, weakness, numbness, dizziness, lightheadedness, or syncope.   Past Medical History:  Diagnosis Date   Aortic insufficiency    Coronary artery disease    coronary artery bypass graft x 5 in 1996   Diabetes mellitus    Hypercholesterolemia    non-insulin dependent   Hypertension    Ischemic cardiomyopathy    Mitral regurgitation    PVC (premature ventricular contraction)     Past Surgical History:  Procedure Laterality Date   CORONARY ARTERY BYPASS GRAFT     LUNG SURGERY     OTHER SURGICAL HISTORY     percutaneous coronary intervention of the  posterolateral segment on 01/27/1998     Medications Prior to Admission: Prior to Admission medications   Medication Sig Start Date End Date Taking? Authorizing Provider  amiodarone (PACERONE) 200 MG tablet Take 1 tablet (200 mg total) by mouth daily. 09/01/20   Loel Dubonnet, NP  aspirin EC 81  MG tablet Take 81 mg by mouth every morning. Swallow whole.    [provider]  Cholecalciferol (VITAMIN D3 PO) Take 1 capsule by mouth at bedtime.    [provider]  furosemide (LASIX) 20 MG tablet Take 1 tablet (20 mg total) by mouth as needed. 11/14/20 02/12/21  Loel Dubonnet, NP  oxybutynin (DITROPAN) 5 MG tablet TAKE 1 TABLET EVERY 8 HOURS AS NEEDED FOR UP TO 14 DAYS FOR BLADDER SPASMS. 09/08/20   Biagio Borg, MD  QUEtiapine (SEROQUEL) 50 MG tablet Take 1 tablet  (50 mg total) by mouth at bedtime. 11/03/20   Biagio Borg, MD  rosuvastatin (CRESTOR) 20 MG tablet Take 1 tablet (20 mg total) by mouth daily. 09/01/20   Loel Dubonnet, NP  traZODone (DESYREL) 50 MG tablet TAKE 1/2 TO 1 TABLET BY MOUTH AT BEDTIME AS NEEDED FOR SLEEP 12/03/20   Biagio Borg, MD  vitamin E 180 MG (400 UNITS) capsule Take 400 Units by mouth daily.    [provider]     Allergies:   No Known Allergies  Social History:   Social History   Socioeconomic History   Marital status: Married    Spouse name: Not on file   Number of children: 1   Years of education: Not on file   Highest education level: Not on file  Occupational History   Occupation: Runs a Acupuncturist: SELF-EMPLOYED  Tobacco Use   Smoking status: Never   Smokeless tobacco: Never  Vaping Use   Vaping Use: Never used  Substance and Sexual Activity   Alcohol use: No   Drug use: No   Sexual activity: Not Currently  Other Topics Concern   Not on file  Social History Narrative   Not on file   Social Determinants of Health   Financial Resource Strain: Not on file  Food Insecurity: Not on file  Transportation Needs: Not on file  Physical Activity: Not on file  Stress: Not on file  Social Connections: Not on file  Intimate Partner Violence: Not on file    Family History:   The Philip's family history is negative for CAD.    ROS:  Please see the history of present illness.  All other ROS reviewed and negative.     Physical Exam/Data:   Vitals:   12/06/20 2209 12/06/20 2330 12/07/20 0015  BP: (!) 142/54 (!) 129/47 (!) 130/55  Pulse: (!) 58 (!) 56 (!) 58  Resp: 18 14 17   Temp: 98.4 F (36.9 C)    SpO2: 100% 96% 96%   No intake or output data in the 24 hours ending 12/07/20 0048 Last 3 Weights 11/14/2020 11/03/2020 11/01/2020  Weight (lbs) 168 lb 8 oz 166 lb 166 lb 12.8 oz  Weight (kg) 76.431 kg 75.297 kg 75.66 kg     There is no height or weight on file to  calculate BMI.  General:  Well nourished, well developed, in no acute distress HEENT: normal Neck: no JVD Vascular: Distal pulses 2+ bilaterally   Cardiac:  normal S1, S2; RRR; II/VI systolic murmur at LUSB Lungs:  clear to auscultation bilaterally, no wheezing, rhonchi or rales  Abd: soft, nontender, no hepatomegaly  Ext: no edema, cool extremities bilaterally Musculoskeletal:  No deformities, BUE and BLE strength normal and equal Skin: warm and dry  Neuro:  CNs 2-12 intact, grossly normal Psych:  Normal affect    EKG:  The ECG that was done  was personally reviewed and demonstrates RBBB & 1st degree AV block    Relevant CV Studies:   TTE 07/31/20:  IMPRESSIONS     1. Left ventricular ejection fraction, by estimation, is 45 to 50%. The  left ventricle has mildly decreased function. Left ventricular endocardial  border not optimally defined to evaluate regional wall motion. There is  severe asymmetric left ventricular   hypertrophy of the septal segment. Left ventricular diastolic parameters  are indeterminate.   2. Right ventricular systolic function is normal. The right ventricular  size is normal. Tricuspid regurgitation signal is inadequate for assessing  PA pressure.   3. Left atrial size was mildly dilated.   4. The mitral valve is abnormal. Moderate mitral valve regurgitation. No  evidence of mitral stenosis.   5. Tricuspid valve regurgitation is mild to moderate.   6. The aortic valve is tricuspid. There is mild calcification of the  aortic valve. There is mild thickening of the aortic valve.   7. Aortic dilatation noted. There is mild dilatation of the aortic root,  measuring 40 mm.   Laboratory Data:  High Sensitivity Troponin:  No results for input(s): TROPONINIHS in the last 720 hours.    Chemistry Recent Labs  Lab 12/06/20 2237  NA 135  K 4.2  CL 105  CO2 23  GLUCOSE 122*  BUN 35*  CREATININE 1.68*  CALCIUM 9.0  MG 2.1  GFRNONAA 40*  ANIONGAP  7    Recent Labs  Lab 12/06/20 2237  PROT 6.8  ALBUMIN 3.3*  AST 28  ALT 28  ALKPHOS 56  BILITOT 0.4   Lipids No results for input(s): CHOL, TRIG, HDL, LABVLDL, LDLCALC, CHOLHDL in the last 168 hours. Hematology Recent Labs  Lab 12/06/20 2237  WBC 8.5  RBC 3.56*  HGB 11.5*  HCT 36.2*  MCV 101.7*  MCH 32.3  MCHC 31.8  RDW 14.6  PLT 258   Thyroid No results for input(s): TSH, FREET4 in the last 168 hours. BNPNo results for input(s): BNP, PROBNP in the last 168 hours.  DDimer No results for input(s): DDIMER in the last 168 hours.   Radiology/Studies:  DG Chest 2 View  Result Date: 12/06/2020 CLINICAL DATA:  CHF EXAM: CHEST - 2 VIEW COMPARISON:  07/31/2020 FINDINGS: Unchanged elevation of the left hemidiaphragm. No overt pulmonary edema. Bibasilar atelectasis. Mild cardiomegaly, status post CABG. IMPRESSION: Cardiomegaly without overt pulmonary edema. Electronically Signed   By: Ulyses Jarred M.D.   On: 12/06/2020 23:56     Assessment and Plan:   Philip Kerr is a 87 y.o. 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  #Sinus Pauses :: Philip presented to Philip Kerr at my request for reported sinus pause at 6.9 seconds in duration on 11/29 at 6:44 PM.  The Philip denies having any symptoms at this time or recently; however, at some point in the recent past he reported dizzy spells to his cardiologist.  Regardless, I think with his prior possible symptoms of presyncope and now two documented episodes of sinus pauses (1 during his recent hospitalization and now this one), he has likely brought himself a PPM.  In speaking with the Philip he seems to be fairly resistant to medical intervention at this time.  He stated to me that he would be amenable to getting a pacemaker if needed, but not this week because it  is his wife's birthday this weekend.  I  discussed with the Philip that if he were to have another sinus pause that he could result in syncope which could result in injury or even death.  He understood this and responded, " we all have to die sometime."  If the Philip is amenable, I think a PPM is indicated since there are no clear reversible causes for sinus pauses.  The amiodarone that he is on does have some beta-blocker effect; however, placement of a PPM in order to maximize GDMT/therapies for heart failure is also another indication why he would benefit from a PPM.  We will discuss with EP tomorrow and ultimately will be up to the Philip whether or not if/when he would consider getting a PPM. -Consult EP in the a.m. for pacemaker consideration -Maintain telemetry overnight -Avoid AV nodal blocking agents if possible  #NSVT -Continue amiodarone 200 mg daily -Maintain K >4, mag>2   #HFimprEF #ICM :: Appears euvolemic and well compensated on examination. -Spot dose diuretics as needed -Holding home Lasix 20 mg as needed  HLD -Continue rosuvastatin  #DM2 -SSI   Risk Assessment/Risk Scores:       New York Heart Association (NYHA) Functional Class NYHA Class I     Severity of Illness: The appropriate Philip status for this Philip is OBSERVATION. Observation status is judged to be reasonable and necessary in order to provide the required intensity of service to ensure the Philip's safety. The Philip's presenting symptoms, physical exam findings, and initial radiographic and laboratory data in the context of their medical condition is felt to place them at decreased risk for further clinical deterioration. Furthermore, it is anticipated that the Philip will be medically stable for discharge from the hospital within 2 midnights of admission.    For questions or updates, please contact [place] Please consult [email or web address] for contact info under     Signed, Hershal Coria, MD  12/07/2020 12:48 AM

## 2020-12-08 ENCOUNTER — Inpatient Hospital Stay (HOSPITAL_COMMUNITY): Payer: Medicare HMO

## 2020-12-08 ENCOUNTER — Encounter (HOSPITAL_COMMUNITY): Payer: Self-pay | Admitting: Cardiology

## 2020-12-08 DIAGNOSIS — I495 Sick sinus syndrome: Secondary | ICD-10-CM | POA: Diagnosis not present

## 2020-12-08 LAB — GLUCOSE, CAPILLARY: Glucose-Capillary: 102 mg/dL — ABNORMAL HIGH (ref 70–99)

## 2020-12-08 MED ORDER — ASPIRIN EC 81 MG PO TBEC: 81.0000 mg | DELAYED_RELEASE_TABLET | Freq: Every day | ORAL | 11 refills | Status: DC

## 2020-12-08 NOTE — Progress Notes (Signed)
Pt safely discharged. Discharge packet provided with teach-back method to Pt and spouse. VS as per flow. IV removed, telemetry stopped. PPM box and dock with Pt. Pt & spouse verbalized understanding. All questions and concerns addressed. Transport x2 present with wheelchairs for pick up. Relinquishing care.

## 2020-12-08 NOTE — Discharge Summary (Addendum)
ELECTROPHYSIOLOGY PROCEDURE DISCHARGE SUMMARY    Patient ID: Philip Kerr,  MRN: [id number], DOB/AGE: August 06, 1933 87 y.o.  Admit date: 12/06/2020 Discharge date: 12/08/2020  Primary Care Physician: Biagio Borg, MD  Primary Cardiologist: Lauree Chandler, MD  Electrophysiologist: New to Dr. Curt Bears  Primary Discharge Diagnosis:  Symptomatic bradycardia status post pacemaker implantation this admission  Secondary Discharge Diagnosis:  Sinus Node dysfunction CAD s/p CABG HTN HLD DM2 CKD III  No Known Allergies   Procedures This Admission:  1.  Implantation of a Medtronic dual chamber PPM on 12/07/2020 by Dr. Curt Bears. The patient received a Medtronic model number [id number] PPM with model number 5076 right atrial lead and 5076 right ventricular lead. There were no immediate post procedure complications. 2.  CXR on 12/08/20 demonstrated no pneumothorax status post device implantation.   Brief HPI: Philip Kerr is a 86 y.o. male was admitted for sinus pauses on live monitoring and electrophysiology team asked to see for consideration of PPM implantation.  Past medical history includes above.  The patient has had symptomatic bradycardia without reversible causes identified.  Risks, benefits, and alternatives to PPM implantation were reviewed with the patient who wished to proceed.   Hospital Course:  The patient was admitted and underwent implantation of a Medtronic dual chamber PPM with details as outlined above.  He was monitored on telemetry overnight which demonstrated NSR 70-80s with occasional PVCs.  Left chest was without hematoma or ecchymosis.  The device was interrogated and found to be functioning normally.  CXR was obtained and demonstrated no pneumothorax status post device implantation.  Wound care, arm mobility, and restrictions were reviewed with the patient.  The patient was examined and considered stable for discharge to home.    Physical Exam: Vitals:    12/07/20 1743 12/07/20 1950 12/08/20 0025 12/08/20 0447  BP: 134/66 (!) 127/56 134/61 119/68  Pulse: 63 60  77  Resp: 19 18  18   Temp: 97.8 F (36.6 C) 97.6 F (36.4 C) (!) 97.5 F (36.4 C) (!) 97.3 F (36.3 C)  TempSrc: Oral Oral Oral Oral  SpO2: 100% 99% 98% 96%  Weight:      Height: 5\' 10"  (1.778 m)       GEN- The patient is well appearing, alert and oriented x 3 today.   HEENT: normocephalic, atraumatic; sclera clear, conjunctiva pink; hearing intact; oropharynx clear; neck supple, no JVP Lymph- no cervical lymphadenopathy Lungs- Clear to ausculation bilaterally, normal work of breathing.  No wheezes, rales, rhonchi Heart- Regular rate and rhythm, no murmurs, rubs or gallops, PMI not laterally displaced GI- soft, non-tender, non-distended, bowel sounds present, no hepatosplenomegaly Extremities- no clubbing, cyanosis, or edema; DP/PT/radial pulses 2+ bilaterally MS- no significant deformity or atrophy Skin- warm and dry, no rash or lesion, left chest without hematoma/ecchymosis Psych- euthymic mood, full affect Neuro- strength and sensation are intact   Labs:   Lab Results  Component Value Date   WBC 8.5 12/06/2020   HGB 11.5 (L) 12/06/2020   HCT 36.2 (L) 12/06/2020   MCV 101.7 (H) 12/06/2020   PLT 258 12/06/2020    Recent Labs  Lab 12/06/20 2237  NA 135  K 4.2  CL 105  CO2 23  BUN 35*  CREATININE 1.68*  CALCIUM 9.0  PROT 6.8  BILITOT 0.4  ALKPHOS 56  ALT 28  AST 28  GLUCOSE 122*    Discharge Medications:  Allergies as of 12/08/2020   No Known Allergies  Medication List     TAKE these medications    amiodarone 200 MG tablet Commonly known as: PACERONE Take 1 tablet (200 mg total) by mouth daily.   aspirin EC 81 MG tablet Take 1 tablet (81 mg total) by mouth daily. Swallow whole. Start taking on: December 12, 2020 What changed: These instructions start on December 12, 2020. If you are unsure what to do until then, ask your doctor or  other care provider.   Cinnamon 500 MG capsule Take 1,000 mg by mouth daily.   docusate sodium 250 MG capsule Commonly known as: COLACE Take 250 mg by mouth daily as needed for mild constipation.   furosemide 20 MG tablet Commonly known as: LASIX Take 1 tablet (20 mg total) by mouth as needed. What changed: reasons to take this   MELATONIN PO Take 1 tablet by mouth at bedtime.   OMEGA 3 PO Take 1 capsule by mouth daily.   OVER THE COUNTER MEDICATION Take 1 tablet by mouth daily. Super beta   oxybutynin 5 MG tablet Commonly known as: DITROPAN TAKE 1 TABLET EVERY 8 HOURS AS NEEDED FOR UP TO 14 DAYS FOR BLADDER SPASMS. What changed: See the new instructions.   QUEtiapine 50 MG tablet Commonly known as: SEROquel Take 1 tablet (50 mg total) by mouth at bedtime.   rosuvastatin 20 MG tablet Commonly known as: CRESTOR Take 1 tablet (20 mg total) by mouth daily.   simvastatin 40 MG tablet Commonly known as: ZOCOR Take 40 mg by mouth daily.   tamsulosin 0.4 MG Caps capsule Commonly known as: FLOMAX Take 0.4 mg by mouth every evening.   traZODone 50 MG tablet Commonly known as: DESYREL TAKE 1/2 TO 1 TABLET BY MOUTH AT BEDTIME AS NEEDED FOR SLEEP What changed:  reasons to take this additional instructions   vitamin C 1000 MG tablet Take 1,000 mg by mouth daily.   Vitamin D3 10 MCG (400 UNIT) tablet Take 400 Units by mouth daily.   vitamin E 180 MG (400 UNITS) capsule Take 400 Units by mouth daily.   zinc gluconate 50 MG tablet Take 50 mg by mouth daily.        Disposition:    Follow-up Information     [facility] Follow up.   Why: on 12/14 at 4 pm for post pacemaker wound check Contact information: [place] [id number] [phone]                Duration of Discharge Encounter: Greater than 30 minutes including physician time.  Signed, Shirley Friar, PA-C  12/08/2020 7:54 AM  I have seen and examined this patient with [name].  Agree with above, note added to reflect my findings.  On exam, RRR, no murmurs.  She is now status post Medtronic pacemaker for sinus pauses.  Device functioning appropriately.  Chest x-ray and interrogation without issue.  Plan for discharge today with follow-up in device clinic.  Amay Mijangos M. Chen Saadeh MD 12/08/2020 8:42 AM

## 2020-12-13 ENCOUNTER — Telehealth: Payer: Self-pay | Admitting: Internal Medicine

## 2020-12-13 MED ORDER — ONDANSETRON HCL 4 MG PO TABS: 4.0000 mg | ORAL_TABLET | Freq: Three times a day (TID) | ORAL | 0 refills | Status: DC | PRN

## 2020-12-13 NOTE — Telephone Encounter (Signed)
Spoke with patient's wife and notified of prescription. Per patient's wife, patient is not really nauseous, just has trouble with getting food down. Advised patient's wife that they can discuss this at patient's visit 12/15/20

## 2020-12-13 NOTE — Telephone Encounter (Signed)
Patient has appointment scheduled 12/15/20

## 2020-12-13 NOTE — Telephone Encounter (Signed)
Patient's spouse Jana Half states the patient is nauseas and is having trouble swallowing his food  Patient has an ov scheduled for 12-8  Caller is requesting a call back to discuss patient symptoms

## 2020-12-13 NOTE — Telephone Encounter (Signed)
Ok for zofran prn - done erx

## 2020-12-13 NOTE — Addendum Note (Signed)
Addended by: Biagio Borg on: 12/13/2020 12:05 PM   Modules accepted: Orders

## 2020-12-15 ENCOUNTER — Other Ambulatory Visit: Payer: Self-pay

## 2020-12-15 ENCOUNTER — Encounter: Payer: Self-pay | Admitting: Internal Medicine

## 2020-12-15 ENCOUNTER — Ambulatory Visit (INDEPENDENT_AMBULATORY_CARE_PROVIDER_SITE_OTHER): Payer: Medicare HMO | Admitting: Internal Medicine

## 2020-12-15 VITALS — BP 118/60 | HR 79 | Resp 18 | Ht 70.0 in | Wt 162.6 lb

## 2020-12-15 DIAGNOSIS — E559 Vitamin D deficiency, unspecified: Secondary | ICD-10-CM | POA: Diagnosis not present

## 2020-12-15 DIAGNOSIS — N1831 Chronic kidney disease, stage 3a: Secondary | ICD-10-CM | POA: Diagnosis not present

## 2020-12-15 DIAGNOSIS — I10 Essential (primary) hypertension: Secondary | ICD-10-CM | POA: Diagnosis not present

## 2020-12-15 DIAGNOSIS — R1310 Dysphagia, unspecified: Secondary | ICD-10-CM | POA: Insufficient documentation

## 2020-12-15 DIAGNOSIS — R1314 Dysphagia, pharyngoesophageal phase: Secondary | ICD-10-CM

## 2020-12-15 NOTE — Patient Instructions (Signed)
Please continue all other medications as before, and refills have been done if requested.  Please have the pharmacy call with any other refills you may need.  Please continue your efforts at being more active, low cholesterol diet, and weight control.  Please keep your appointments with your specialists as you may have planned  You will be contacted regarding the referral for: modified barium swallow  You will be contacted regarding the referral for: Gastroenterology  Please make an Appointment to return in 3 months, or sooner if needed

## 2020-12-15 NOTE — Progress Notes (Signed)
Patient ID: Philip Kerr, male   DOB: [birth year], 87 y.o.   MRN: [id number]        Chief Complaint: follow up HTN, hyperglycemia, dysphagia       HPI:  Philip Kerr is a 87 y.o. male here with wife overall doing ok.  Pt denies chest pain, increased sob or doe, wheezing, orthopnea, PND, increased LE swelling, palpitations, dizziness or syncope.  Pt denies polydipsia, polyuria, or new focal neuro s/s.  Denies worsening reflux, abd pain, n/v, bowel change or blood, but does have 2-3 wks onset new dysphagia to solids it seems with some choking and hard to swallow for unclear reason.  Has lost several lbs, but states liquids are not a problem.   Pt denies fever, night sweats, loss of appetite, or other constitutional symptoms         Wt Readings from Last 3 Encounters:  12/15/20 162 lb 9.6 oz (73.8 kg)  12/07/20 168 lb (76.2 kg)  11/14/20 168 lb 8 oz (76.4 kg)   BP Readings from Last 3 Encounters:  12/15/20 118/60  12/08/20 132/80  11/14/20 126/60         Past Medical History:  Diagnosis Date   Aortic insufficiency    Coronary artery disease    coronary artery bypass graft x 5 in 1996   Diabetes mellitus    Hypercholesterolemia    non-insulin dependent   Hypertension    Ischemic cardiomyopathy    Mitral regurgitation    PVC (premature ventricular contraction)    Past Surgical History:  Procedure Laterality Date   CORONARY ARTERY BYPASS GRAFT     LUNG SURGERY     OTHER SURGICAL HISTORY     percutaneous coronary intervention of the  posterolateral segment on 01/27/1998   PACEMAKER IMPLANT N/A 12/07/2020   Procedure: PACEMAKER IMPLANT;  Surgeon: Constance Haw, MD;  Location: [facility];  Service: Cardiovascular;  Laterality: N/A;    reports that he has never smoked. He has never used smokeless tobacco. He reports that he does not drink alcohol and does not use drugs. family history is not on file. No Known Allergies Current Outpatient Medications on File Prior  to Visit  Medication Sig Dispense Refill   amiodarone (PACERONE) 200 MG tablet Take 1 tablet (200 mg total) by mouth daily. 30 tablet 5   Ascorbic Acid (VITAMIN C) 1000 MG tablet Take 1,000 mg by mouth daily.     aspirin EC 81 MG tablet Take 1 tablet (81 mg total) by mouth daily. Swallow whole. 30 tablet 11   Cholecalciferol (VITAMIN D3) 10 MCG (400 UNIT) tablet Take 400 Units by mouth daily.     Cinnamon 500 MG capsule Take 1,000 mg by mouth daily.     docusate sodium (COLACE) 250 MG capsule Take 250 mg by mouth daily as needed for mild constipation.     furosemide (LASIX) 20 MG tablet Take 1 tablet (20 mg total) by mouth as needed. (Patient taking differently: Take 20 mg by mouth as needed for fluid or edema.) 30 tablet 2   MELATONIN PO Take 1 tablet by mouth at bedtime.     Omega-3 Fatty Acids (OMEGA 3 PO) Take 1 capsule by mouth daily.     ondansetron (ZOFRAN) 4 MG tablet Take 1 tablet (4 mg total) by mouth every 8 (eight) hours as needed for nausea or vomiting. 20 tablet 0   OVER THE COUNTER MEDICATION Take 1 tablet by mouth daily. Super beta  oxybutynin (DITROPAN) 5 MG tablet TAKE 1 TABLET EVERY 8 HOURS AS NEEDED FOR UP TO 14 DAYS FOR BLADDER SPASMS. (Patient taking differently: Take 5 mg by mouth 2 (two) times daily.) 270 tablet 1   QUEtiapine (SEROQUEL) 50 MG tablet Take 1 tablet (50 mg total) by mouth at bedtime. 90 tablet 3   rosuvastatin (CRESTOR) 20 MG tablet Take 1 tablet (20 mg total) by mouth daily. 90 tablet 3   simvastatin (ZOCOR) 40 MG tablet Take 40 mg by mouth daily.     tamsulosin (FLOMAX) 0.4 MG CAPS capsule Take 0.4 mg by mouth every evening.     traZODone (DESYREL) 50 MG tablet TAKE 1/2 TO 1 TABLET BY MOUTH AT BEDTIME AS NEEDED FOR SLEEP (Patient taking differently: Take 25-50 mg by mouth at bedtime as needed for sleep.) 90 tablet 1   vitamin E 180 MG (400 UNITS) capsule Take 400 Units by mouth daily.     zinc gluconate 50 MG tablet Take 50 mg by mouth daily.     No  current facility-administered medications on file prior to visit.        ROS:  All others reviewed and negative.  Objective        PE:  BP 118/60   Pulse 79   Resp 18   Ht 5\' 10"  (1.778 m)   Wt 162 lb 9.6 oz (73.8 kg)   SpO2 97%   BMI 23.33 kg/m                 Constitutional: Pt appears in NAD               HENT: Head: NCAT.                Right Ear: External ear normal.                 Left Ear: External ear normal.                Eyes: . Pupils are equal, round, and reactive to light. Conjunctivae and EOM are normal               Nose: without d/c or deformity               Neck: Neck supple. Gross normal ROM               Cardiovascular: Normal rate and regular rhythm.                 Pulmonary/Chest: Effort normal and breath sounds without rales or wheezing.                Abd:  Soft, NT, ND, + BS, no organomegaly               Neurological: Pt is alert. At baseline orientation, motor grossly intact               Skin: Skin is warm. No rashes, no other new lesions, LE edema - none               Psychiatric: Pt behavior is normal without agitation   Micro: none  Cardiac tracings I have personally interpreted today:  none  Pertinent Radiological findings (summarize): none   Lab Results  Component Value Date   WBC 8.5 12/06/2020   HGB 11.5 (L) 12/06/2020   HCT 36.2 (L) 12/06/2020   PLT 258 12/06/2020   GLUCOSE 122 (H) 12/06/2020   CHOL 90 11/03/2020  TRIG 92.0 11/03/2020   HDL 44.00 11/03/2020   LDLCALC 28 11/03/2020   ALT 28 12/06/2020   AST 28 12/06/2020   NA 135 12/06/2020   K 4.2 12/06/2020   CL 105 12/06/2020   CREATININE 1.68 (H) 12/06/2020   BUN 35 (H) 12/06/2020   CO2 23 12/06/2020   TSH 7.340 (H) 11/01/2020   PSA 1.97 09/06/2017   INR 1.2 12/07/2020   HGBA1C 6.1 11/03/2020   MICROALBUR 7.4 (H) 11/03/2020   Assessment/Plan:  Philip Kerr is a 87 y.o. White or Caucasian [1] male with  has a past medical history of Aortic insufficiency,  Coronary artery disease, Diabetes mellitus, Hypercholesterolemia, Hypertension, Ischemic cardiomyopathy, Mitral regurgitation, and PVC (premature ventricular contraction).  Essential hypertension BP Readings from Last 3 Encounters:  12/15/20 118/60  12/08/20 132/80  11/14/20 126/60   Stable, pt to continue medical treatment lasix   Chronic kidney disease, stage 3a ([place]) Lab Results  Component Value Date   CREATININE 1.68 (H) 12/06/2020   Stable overall, cont to avoid nephrotoxins   Dysphagia Recent onset, no other neuro s/s, no aspiration, but will need mod barium swallow and refer GI - may need EGD  Vitamin D deficiency Last vitamin D Lab Results  Component Value Date   VD25OH 50.48 11/03/2020   Stable, cont oral replacement  Followup: Return in about 3 months (around 03/15/2021).  Cathlean Cower, MD 12/18/2020 8:38 PM [facility] dysp

## 2020-12-16 ENCOUNTER — Other Ambulatory Visit (HOSPITAL_COMMUNITY): Payer: Self-pay

## 2020-12-16 DIAGNOSIS — R1310 Dysphagia, unspecified: Secondary | ICD-10-CM

## 2020-12-18 ENCOUNTER — Encounter: Payer: Self-pay | Admitting: Internal Medicine

## 2020-12-18 NOTE — Assessment & Plan Note (Signed)
Recent onset, no other neuro s/s, no aspiration, but will need mod barium swallow and refer GI - may need EGD

## 2020-12-18 NOTE — Assessment & Plan Note (Signed)
BP Readings from Last 3 Encounters:  12/15/20 118/60  12/08/20 132/80  11/14/20 126/60   Stable, pt to continue medical treatment lasix

## 2020-12-18 NOTE — Assessment & Plan Note (Signed)
Last vitamin D Lab Results  Component Value Date   VD25OH 50.48 11/03/2020   Stable, cont oral replacement

## 2020-12-18 NOTE — Assessment & Plan Note (Signed)
Lab Results  Component Value Date   CREATININE 1.68 (H) 12/06/2020   Stable overall, cont to avoid nephrotoxins

## 2020-12-19 ENCOUNTER — Telehealth: Payer: Self-pay | Admitting: Internal Medicine

## 2020-12-19 NOTE — Telephone Encounter (Signed)
Patient's wife called on his behalf to disregard the referral to [facility]. Patient not longer wants to do the swallow test. Relayed the referral has already been sent to GI so they might still contact them. Caller understood.

## 2020-12-21 ENCOUNTER — Ambulatory Visit: Payer: Medicare HMO

## 2020-12-21 ENCOUNTER — Ambulatory Visit (INDEPENDENT_AMBULATORY_CARE_PROVIDER_SITE_OTHER): Payer: Medicare HMO

## 2020-12-21 ENCOUNTER — Other Ambulatory Visit: Payer: Self-pay

## 2020-12-21 DIAGNOSIS — I455 Other specified heart block: Secondary | ICD-10-CM | POA: Diagnosis not present

## 2020-12-21 LAB — CUP PACEART INCLINIC DEVICE CHECK
Battery Remaining Longevity: 177 mo
Battery Voltage: 3.23 V
Brady Statistic AP VP Percent: 0.01 %
Brady Statistic AP VS Percent: 15.18 %
Brady Statistic AS VP Percent: 0.08 %
Brady Statistic AS VS Percent: 84.73 %
Brady Statistic RA Percent Paced: 15.55 %
Brady Statistic RV Percent Paced: 0.09 %
Date Time Interrogation Session: 20221214165132
Implantable Lead Implant Date: 20221130
Implantable Lead Implant Date: 20221130
Implantable Lead Location: 753859
Implantable Lead Location: 753860
Implantable Lead Model: 5076
Implantable Lead Model: 5076
Implantable Pulse Generator Implant Date: 20221130
Lead Channel Impedance Value: 323 Ohm
Lead Channel Impedance Value: 475 Ohm
Lead Channel Impedance Value: 475 Ohm
Lead Channel Impedance Value: 798 Ohm
Lead Channel Pacing Threshold Amplitude: 0.75 V
Lead Channel Pacing Threshold Amplitude: 1.125 V
Lead Channel Pacing Threshold Pulse Width: 0.4 ms
Lead Channel Pacing Threshold Pulse Width: 0.4 ms
Lead Channel Sensing Intrinsic Amplitude: 1.625 mV
Lead Channel Sensing Intrinsic Amplitude: 13 mV
Lead Channel Setting Pacing Amplitude: 3.5 V
Lead Channel Setting Pacing Amplitude: 3.5 V
Lead Channel Setting Pacing Pulse Width: 0.4 ms
Lead Channel Setting Sensing Sensitivity: 0.9 mV

## 2020-12-21 NOTE — Progress Notes (Signed)

## 2020-12-21 NOTE — Patient Instructions (Signed)

## 2020-12-27 ENCOUNTER — Other Ambulatory Visit: Payer: Self-pay

## 2020-12-27 ENCOUNTER — Ambulatory Visit (HOSPITAL_COMMUNITY)
Admission: RE | Admit: 2020-12-27 | Discharge: 2020-12-27 | Disposition: A | Discharge status: Home / Self Care | Payer: Medicare HMO | Source: Ambulatory Visit | Attending: Internal Medicine | Admitting: Internal Medicine

## 2020-12-27 ENCOUNTER — Ambulatory Visit (HOSPITAL_COMMUNITY): Admission: RE | Admit: 2020-12-27 | Payer: Medicare HMO | Source: Ambulatory Visit

## 2020-12-27 DIAGNOSIS — R1314 Dysphagia, pharyngoesophageal phase: Secondary | ICD-10-CM

## 2020-12-30 ENCOUNTER — Telehealth (HOSPITAL_COMMUNITY): Payer: Self-pay

## 2020-12-30 NOTE — Telephone Encounter (Signed)
Called and spoke with wife to reschedule OP MBS - wife stated they had no idea about test that was scheduled on 12/20. Scheduled with wife on 12/9 for 12/20 appt. Wife stated patient is not interested in rescheduling. Cancelled order.

## 2021-01-04 ENCOUNTER — Other Ambulatory Visit: Payer: Self-pay | Admitting: Internal Medicine

## 2021-01-04 ENCOUNTER — Other Ambulatory Visit: Payer: Self-pay | Admitting: Cardiovascular Disease

## 2021-01-04 ENCOUNTER — Other Ambulatory Visit: Payer: Self-pay | Admitting: Physician Assistant

## 2021-01-10 ENCOUNTER — Ambulatory Visit: Payer: Medicare HMO | Admitting: Physician Assistant

## 2021-01-10 ENCOUNTER — Other Ambulatory Visit: Payer: Self-pay | Admitting: Internal Medicine

## 2021-01-10 DIAGNOSIS — I5023 Acute on chronic systolic (congestive) heart failure: Secondary | ICD-10-CM | POA: Diagnosis not present

## 2021-01-10 DIAGNOSIS — N390 Urinary tract infection, site not specified: Secondary | ICD-10-CM | POA: Diagnosis not present

## 2021-01-10 DIAGNOSIS — I129 Hypertensive chronic kidney disease with stage 1 through stage 4 chronic kidney disease, or unspecified chronic kidney disease: Secondary | ICD-10-CM | POA: Diagnosis not present

## 2021-01-10 DIAGNOSIS — N1832 Chronic kidney disease, stage 3b: Secondary | ICD-10-CM | POA: Diagnosis not present

## 2021-01-10 DIAGNOSIS — N179 Acute kidney failure, unspecified: Secondary | ICD-10-CM | POA: Diagnosis not present

## 2021-01-10 DIAGNOSIS — I4729 Other ventricular tachycardia: Secondary | ICD-10-CM

## 2021-01-10 DIAGNOSIS — Z79899 Other long term (current) drug therapy: Secondary | ICD-10-CM

## 2021-01-10 DIAGNOSIS — R339 Retention of urine, unspecified: Secondary | ICD-10-CM | POA: Diagnosis not present

## 2021-01-10 DIAGNOSIS — R000 Tachycardia, unspecified: Secondary | ICD-10-CM

## 2021-01-11 NOTE — Telephone Encounter (Signed)
Sorry - pt has dementia though wife does not and is supportive  Ok to let wife know, the amiodarone rx is normally refilled per cardiology, thanks

## 2021-01-13 ENCOUNTER — Other Ambulatory Visit: Payer: Self-pay | Admitting: Nephrology

## 2021-01-13 ENCOUNTER — Other Ambulatory Visit (HOSPITAL_BASED_OUTPATIENT_CLINIC_OR_DEPARTMENT_OTHER): Payer: Self-pay | Admitting: Family

## 2021-01-13 DIAGNOSIS — I4729 Other ventricular tachycardia: Secondary | ICD-10-CM

## 2021-01-13 DIAGNOSIS — R000 Tachycardia, unspecified: Secondary | ICD-10-CM

## 2021-01-13 DIAGNOSIS — Z79899 Other long term (current) drug therapy: Secondary | ICD-10-CM

## 2021-01-13 DIAGNOSIS — R339 Retention of urine, unspecified: Secondary | ICD-10-CM

## 2021-01-16 ENCOUNTER — Telehealth: Payer: Self-pay

## 2021-01-16 NOTE — Telephone Encounter (Signed)
Transmission received. I told the patient wife the nurse will give her a call back.

## 2021-01-16 NOTE — Telephone Encounter (Signed)
Patient wife called and left a message stating his heart is skipping beats.   I spoke with the patient and he states he is not at home. He states it feels like his heart is skipping beats or doing something weird. I asked him to send a transmission. When he get home he will send the transmission or call us to help him send the transmission.

## 2021-01-16 NOTE — Telephone Encounter (Signed)
Manual trasnmission reveiwed.  Normal device function.  Pt does have PVCs, no comparison available to determine if they are increased.

## 2021-01-25 ENCOUNTER — Ambulatory Visit
Admission: RE | Admit: 2021-01-25 | Discharge: 2021-01-25 | Disposition: A | Discharge status: Home / Self Care | Payer: Medicare HMO | Source: Ambulatory Visit | Attending: Nephrology | Admitting: Nephrology

## 2021-01-25 DIAGNOSIS — R339 Retention of urine, unspecified: Secondary | ICD-10-CM | POA: Diagnosis not present

## 2021-01-25 DIAGNOSIS — N281 Cyst of kidney, acquired: Secondary | ICD-10-CM | POA: Diagnosis not present

## 2021-02-28 DIAGNOSIS — R310 Gross hematuria: Secondary | ICD-10-CM | POA: Diagnosis not present

## 2021-02-28 DIAGNOSIS — R338 Other retention of urine: Secondary | ICD-10-CM | POA: Diagnosis not present

## 2021-03-06 ENCOUNTER — Ambulatory Visit (HOSPITAL_BASED_OUTPATIENT_CLINIC_OR_DEPARTMENT_OTHER): Payer: Medicare HMO | Admitting: Family

## 2021-03-06 ENCOUNTER — Encounter (HOSPITAL_BASED_OUTPATIENT_CLINIC_OR_DEPARTMENT_OTHER): Payer: Self-pay | Admitting: Family

## 2021-03-06 NOTE — Progress Notes (Unsigned)
Office Visit    Patient Name: Philip Kerr Date of Encounter: 03/06/2021  PCP:  Biagio Borg, MD   [place]  Cardiologist:  Lauree Chandler, MD  Advanced Practice Provider:  No care team member to display Electrophysiologist:  None      Chief Complaint    Philip Kerr is a 88 y.o. male with a hx of CAD s/p CABG, ICM, left bundle branch block, diabetes, CKD 3, hypertension, COVID-19 pneumonia, sinus pause s/p PPM presents today for ***  Past Medical History    Past Medical History:  Diagnosis Date   Aortic insufficiency    Coronary artery disease    coronary artery bypass graft x 5 in 1996   Diabetes mellitus    Hypercholesterolemia    non-insulin dependent   Hypertension    Ischemic cardiomyopathy    Mitral regurgitation    PVC (premature ventricular contraction)    Sinus pause 12/07/2020   s/p PPM   Past Surgical History:  Procedure Laterality Date   CORONARY ARTERY BYPASS GRAFT     LUNG SURGERY     OTHER SURGICAL HISTORY     percutaneous coronary intervention of the  posterolateral segment on 01/27/1998   PACEMAKER IMPLANT N/A 12/07/2020   Procedure: PACEMAKER IMPLANT;  Surgeon: Constance Haw, MD;  Location: [facility];  Service: Cardiovascular;  Laterality: N/A;    Allergies  No Known Allergies  History of Present Illness    Philip Kerr is a 88 y.o. male with a hx of CAD s/p CABG, ICM, left bundle branch block, diabetes, CKD 3, hypertension, COVID-19 pneumonia, sinus pause s/p PPM  last seen for device wound check.  He was admitted 07/22/2020 - 08/08/2020.  On admission he had minimal and stable elevated troponin without EKG changes and was sent home but immediately worsened due to chest pain with weakness and cough found to have transaminitis, possible bacterial pneumonia, COVID-19 positive.  Hospital course complicated by severe delirium which resolved prior to discharge, acute urinary retention  requiring Foley, hematuria requiring CBI. Non sustained episodes of V. tach and placed on beta-blocker with subsequent 8-second pauses and WCT. He was recommended for Amidarone 400mg  BID x 14 days then 400mg  QD by cardiology but was discharged on 400mg  BID x13 days then 247m QD.  Echocardiogram during admission showed LVEF improved to 45-50%, severe LVH, moderate MR.  Seen 09/01/2020 for hospital follow-up.  He was having nausea and poor appetite though had started improving over the previous 3 to 4 days since his amiodarone was reduced to 200 mg daily.  He reported occasional palpitations.  He was fatigued and working with physical therapy.  He was seen 11/01/20 and 11/14/20 noting elevated BP at home but well controlled in clinic. He was recommended to bring BP cuff to next visit. He contacted the office 11/25/20 noting dizzy spells and live monitor ordered by Dr. Sallyanne Kuster. Subsequently admitted 12/06/20 after live monitor reported 6.9 second pause and PPM placed 12/07/20.   Presents today for follow up after calling to report elevated BP. His home readings with arm cuff taken two hours after medications are routinely in the 140s/70s. Reports no shortness of breath nor dyspnea on exertion. Reports no chest pain, pressure, or tightness. No  orthopnea, PND. Reports no palpitations. Notes intermittent bilateral pedal edema - wonders whether he needs a fluid pill.   ***  EKGs/Labs/Other Studies Reviewed:   The following studies were reviewed today: Echo from  07/31/20:    1. Left ventricular ejection fraction, by estimation, is 45 to 50%. The  left ventricle has mildly decreased function. Left ventricular endocardial  border not optimally defined to evaluate regional wall motion. There is  severe asymmetric left ventricular hypertrophy of the septal segment. Left ventricular diastolic parameters are indeterminate.   2. Right ventricular systolic function is normal. The right ventricular  size is  normal. Tricuspid regurgitation signal is inadequate for assessing  PA pressure.   3. Left atrial size was mildly dilated.   4. The mitral valve is abnormal. Moderate mitral valve regurgitation. No  evidence of mitral stenosis.   5. Tricuspid valve regurgitation is mild to moderate.   6. The aortic valve is tricuspid. There is mild calcification of the  aortic valve. There is mild thickening of the aortic valve.   7. Aortic dilatation noted. There is mild dilatation of the aortic root,  measuring 40 mm.    Echo from 09/11/2017:   - Left ventricle: The cavity size was moderately dilated. Wall    thickness was increased in a pattern of severe LVH. Systolic    function was severely reduced. The estimated ejection fraction    was in the range of 25% to 30%. Diffuse hypokinesis. Left    ventricular diastolic function parameters were normal.  - Aortic valve: There was mild regurgitation.  - Mitral valve: There was moderate regurgitation.  - Left atrium: The atrium was moderately dilated.  - Atrial septum: No defect or patent foramen ovale was identified.   EKG:  No EKG today.***  Recent Labs: 07/31/2020: B Natriuretic Peptide 4,160.4 11/01/2020: TSH 7.340 12/06/2020: ALT 28; BUN 35; Creatinine, Ser 1.68; Hemoglobin 11.5; Magnesium 2.1; Platelets 258; Potassium 4.2; Sodium 135  Recent Lipid Panel    Component Value Date/Time   CHOL 90 11/03/2020 1544   CHOL 98 (L) 12/01/2018 0808   TRIG 92.0 11/03/2020 1544   TRIG 102 10/22/2005 0730   HDL 44.00 11/03/2020 1544   HDL 41 12/01/2018 0808   CHOLHDL 2 11/03/2020 1544   VLDL 18.4 11/03/2020 1544   LDLCALC 28 11/03/2020 1544   LDLCALC 44 07/17/2019 1022    Home Medications   No outpatient medications have been marked as taking for the 03/06/21 encounter (Appointment) with Loel Dubonnet, NP.     Review of Systems      All other systems reviewed and are otherwise negative except as noted above.  Physical Exam    VS:  There  were no vitals taken for this visit. , BMI There is no height or weight on file to calculate BMI.  Wt Readings from Last 3 Encounters:  12/15/20 162 lb 9.6 oz (73.8 kg)  12/07/20 168 lb (76.2 kg)  11/14/20 168 lb 8 oz (76.4 kg)   ***  GEN: Well nourished, well developed, in no acute distress. HEENT: normal. Neck: Supple, no JVD, carotid bruits, or masses. Cardiac: RRR, no murmurs, rubs, or gallops. No clubbing, cyanosis, edema.  Radials/PT 2+ and equal bilaterally.  Respiratory:  Respirations regular and unlabored, clear to auscultation bilaterally. GI: Soft, nontender, nondistended. MS: No deformity or atrophy. Skin: Warm and dry, no rash. Neuro:  Strength and sensation are intact. Psych: Normal affect.  Assessment & Plan    Wide-complex tachycardia while admitted 08/06/2020 which was asymptomatic / NST / High grade AV block with discontinuation of BB / Amiodarone / LBBB - Chronic LBBB. Previously on beta-blocker for GDMT for CHF and NSVT.  However had to  be discontinued during 07/2020  admission due to pauses of greater than 8 seconds of high-grade AV block along with WCT. Continue Amiodarone 200mg  QD. 11/01/20 normal LFTs, elevated TSH 7.34 but normal T3 and T4. Recommended for monitoring by primary care for sublinical hypothyroidism. ***  CAD remote CABG - Stable with no anginal symptoms. No indication for ischemic evaluation. Patient prefers conservative approach. GDMT includes aspirin, rosuvastatin.  No beta-blocker due to previous pause, WCT. ***  HFrEF - 2019 LVEF 25%. 07/31/20 LVEF 45-50%, moderate MR, mild to moderate TR. Intolerant to BB. No ACE/ARB/ARNI/MRA due to renal dysfunction. Euvolemic and well compensated on exam. Low salt diet and fluid restriction <2 liters encouraged. Heart healthy diet and regular cardiovascular exercise encouraged.  NOtes intermittent LE edema. Will Rx PRN Lasix 20mg  QD - if using more than once per week he will contact us and will require nephrology  input. ***  CKD - Careful titration of diuretic and antihypertensive.  ***  HTN - BP well controlled in clinic though elevated at home. Will bring BP cuff ot next visit to ensure accuracy. BP goal <130/80. If not at goal, consider Hydralazine. ***  DM2 - Continue to follow with PCP.  ***  Insomnia -endorses difficulty with falling asleep and staying asleep.  No snoring or apneic episodes so will defer sleep study.  He has tried over-the-counter remedies with no relief.  Encouraged to discuss with his primary care provider.***  Disposition: Follow up ***as scheduled.   Signed, Loel Dubonnet, NP 03/06/2021, 2:20 PM [place]

## 2021-03-10 ENCOUNTER — Ambulatory Visit (INDEPENDENT_AMBULATORY_CARE_PROVIDER_SITE_OTHER): Payer: Medicare HMO

## 2021-03-10 DIAGNOSIS — I255 Ischemic cardiomyopathy: Secondary | ICD-10-CM | POA: Diagnosis not present

## 2021-03-10 LAB — CUP PACEART REMOTE DEVICE CHECK
Battery Remaining Longevity: 176 mo
Battery Voltage: 3.22 V
Brady Statistic AP VP Percent: 0.07 %
Brady Statistic AP VS Percent: 21.66 %
Brady Statistic AS VP Percent: 0.04 %
Brady Statistic AS VS Percent: 78.23 %
Brady Statistic RA Percent Paced: 22.36 %
Brady Statistic RV Percent Paced: 0.11 %
Date Time Interrogation Session: 20230303043514
Implantable Lead Implant Date: 20221130
Implantable Lead Implant Date: 20221130
Implantable Lead Location: 753859
Implantable Lead Location: 753860
Implantable Lead Model: 5076
Implantable Lead Model: 5076
Implantable Pulse Generator Implant Date: 20221130
Lead Channel Impedance Value: 285 Ohm
Lead Channel Impedance Value: 418 Ohm
Lead Channel Impedance Value: 437 Ohm
Lead Channel Impedance Value: 703 Ohm
Lead Channel Pacing Threshold Amplitude: 0.625 V
Lead Channel Pacing Threshold Amplitude: 0.75 V
Lead Channel Pacing Threshold Pulse Width: 0.4 ms
Lead Channel Pacing Threshold Pulse Width: 0.4 ms
Lead Channel Sensing Intrinsic Amplitude: 2.5 mV
Lead Channel Sensing Intrinsic Amplitude: 2.5 mV
Lead Channel Sensing Intrinsic Amplitude: 7.375 mV
Lead Channel Sensing Intrinsic Amplitude: 7.375 mV
Lead Channel Setting Pacing Amplitude: 1.5 V
Lead Channel Setting Pacing Amplitude: 2 V
Lead Channel Setting Pacing Pulse Width: 0.4 ms
Lead Channel Setting Sensing Sensitivity: 0.9 mV

## 2021-03-16 ENCOUNTER — Encounter: Payer: Self-pay | Admitting: Cardiology

## 2021-03-16 ENCOUNTER — Other Ambulatory Visit: Payer: Self-pay

## 2021-03-16 ENCOUNTER — Ambulatory Visit: Payer: Medicare HMO | Admitting: Cardiology

## 2021-03-16 VITALS — BP 140/76 | HR 74 | Ht 70.0 in | Wt 155.4 lb

## 2021-03-16 DIAGNOSIS — I495 Sick sinus syndrome: Secondary | ICD-10-CM | POA: Diagnosis not present

## 2021-03-16 NOTE — Progress Notes (Signed)
? ?Electrophysiology Office Note ? ? ?Date:  03/16/2021  ? ?ID:  Philip Kerr, DOB October 17, 1933, MRN [id number] ? ?PCP:  Biagio Borg, MD  ?Cardiologist:  Angelena Form ?Primary Electrophysiologist:  Kooper Chriswell Meredith Leeds, MD   ? ?Chief Complaint: pacemaker ?  ?History of Present Illness: ?Philip Kerr is a 88 y.o. male who is being seen today for the evaluation of pacemaker at the request of Biagio Borg, MD. Presenting today for electrophysiology evaluation. ? ?He has a history significant for sinus node dysfunction, coronary artery disease status post CABG, hypertension, hyperlipidemia, type 2 diabetes, CKD stage III.  He presented to the hospital 12/07/2020 after having sinus pauses on the alive monitoring.  He is now status post Medtronic dual-chamber pacemaker implanted 12/07/2020. ? ?Today, he denies symptoms of palpitations, chest pain, shortness of breath, orthopnea, PND, lower extremity edema, claudication, dizziness, presyncope, syncope, bleeding, or neurologic sequela. The patient is tolerating medications without difficulties.  ? ? ?Past Medical History:  ?Diagnosis Date  ? Aortic insufficiency   ? Coronary artery disease   ? coronary artery bypass graft x 5 in 1996  ? Diabetes mellitus   ? Hypercholesterolemia   ? non-insulin dependent  ? Hypertension   ? Ischemic cardiomyopathy   ? Mitral regurgitation   ? PVC (premature ventricular contraction)   ? Sinus pause 12/07/2020  ? s/p PPM  ? ?Past Surgical History:  ?Procedure Laterality Date  ? CORONARY ARTERY BYPASS GRAFT    ? LUNG SURGERY    ? OTHER SURGICAL HISTORY    ? percutaneous coronary intervention of the  posterolateral segment on 01/27/1998  ? PACEMAKER IMPLANT N/A 12/07/2020  ? Procedure: PACEMAKER IMPLANT;  Surgeon: Constance Haw, MD;  Location: [facility];  Service: Cardiovascular;  Laterality: N/A;  ? ? ? ?Current Outpatient Medications  ?Medication Sig Dispense Refill  ? amiodarone (PACERONE) 200 MG tablet Take 1 tablet (200 mg  total) by mouth daily. 90 tablet 3  ? Ascorbic Acid (VITAMIN C) 1000 MG tablet Take 1,000 mg by mouth daily.    ? aspirin EC 81 MG tablet Take 1 tablet (81 mg total) by mouth daily. Swallow whole. 30 tablet 11  ? Cholecalciferol (VITAMIN D3) 10 MCG (400 UNIT) tablet Take 400 Units by mouth daily.    ? Cinnamon 500 MG capsule Take 1,000 mg by mouth daily.    ? docusate sodium (COLACE) 250 MG capsule Take 250 mg by mouth daily as needed for mild constipation.    ? furosemide (LASIX) 20 MG tablet Take 1 tablet (20 mg total) by mouth as needed. (Patient taking differently: Take 20 mg by mouth as needed for fluid or edema.) 30 tablet 2  ? MELATONIN PO Take 1 tablet by mouth at bedtime.    ? Omega-3 Fatty Acids (OMEGA 3 PO) Take 1 capsule by mouth daily.    ? ondansetron (ZOFRAN) 4 MG tablet Take 1 tablet (4 mg total) by mouth every 8 (eight) hours as needed for nausea or vomiting. 20 tablet 0  ? OVER THE COUNTER MEDICATION Take 1 tablet by mouth daily. Super beta    ? oxybutynin (DITROPAN) 5 MG tablet TAKE 1 TABLET EVERY 8 HOURS AS NEEDED FOR UP TO 14 DAYS FOR BLADDER SPASMS. (Patient taking differently: Take 5 mg by mouth 2 (two) times daily.) 270 tablet 1  ? QUEtiapine (SEROQUEL) 50 MG tablet Take 1 tablet (50 mg total) by mouth at bedtime. 90 tablet 3  ? rosuvastatin (CRESTOR) 20  MG tablet Take 1 tablet (20 mg total) by mouth daily. 90 tablet 3  ? simvastatin (ZOCOR) 40 MG tablet Take 40 mg by mouth daily.    ? tamsulosin (FLOMAX) 0.4 MG CAPS capsule Take 0.4 mg by mouth every evening.    ? traZODone (DESYREL) 50 MG tablet TAKE 1/2 TO 1 TABLET BY MOUTH AT BEDTIME AS NEEDED FOR SLEEP (Patient taking differently: Take 25-50 mg by mouth at bedtime as needed for sleep.) 90 tablet 1  ? vitamin E 180 MG (400 UNITS) capsule Take 400 Units by mouth daily.    ? zinc gluconate 50 MG tablet Take 50 mg by mouth daily.    ? ?No current facility-administered medications for this visit.  ? ? ?Allergies:   Patient has no known  allergies.  ? ?Social History:  The patient  reports that he has never smoked. He has never used smokeless tobacco. He reports that he does not drink alcohol and does not use drugs.  ? ?Family History:  The patient's family history is not on file. He was adopted.  ? ? ?ROS:  Please see the history of present illness.   Otherwise, review of systems is positive for none.   All other systems are reviewed and negative.  ? ? ?PHYSICAL EXAM: ?VS:  BP 140/76   Pulse 74   Ht 5\' 10"  (1.778 m)   Wt 155 lb 6.4 oz (70.5 kg)   SpO2 98%   BMI 22.30 kg/m?  , BMI Body mass index is 22.3 kg/m?. ?GEN: Well nourished, well developed, in no acute distress  ?HEENT: normal  ?Neck: no JVD, carotid bruits, or masses ?Cardiac: RRR; no murmurs, rubs, or gallops,no edema  ?Respiratory:  clear to auscultation bilaterally, normal work of breathing ?GI: soft, nontender, nondistended, + BS ?MS: no deformity or atrophy  ?Skin: warm and dry, device pocket is well healed ?Neuro:  Strength and sensation are intact ?Psych: euthymic mood, full affect ? ?EKG:  EKG is ordered today. ?Personal review of the ekg ordered shows sinsu rhythm, rate 74, LBBB, PVC ? ?Device interrogation is reviewed today in detail.  See PaceArt for details. ? ? ?Recent Labs: ?07/31/2020: B Natriuretic Peptide 4,160.4 ?11/01/2020: TSH 7.340 ?12/06/2020: ALT 28; BUN 35; Creatinine, Ser 1.68; Hemoglobin 11.5; Magnesium 2.1; Platelets 258; Potassium 4.2; Sodium 135  ? ? ?Lipid Panel  ?   ?Component Value Date/Time  ? CHOL 90 11/03/2020 1544  ? CHOL 98 (L) 12/01/2018 5170  ? TRIG 92.0 11/03/2020 1544  ? TRIG 102 10/22/2005 0730  ? HDL 44.00 11/03/2020 1544  ? HDL 41 12/01/2018 0808  ? CHOLHDL 2 11/03/2020 1544  ? VLDL 18.4 11/03/2020 1544  ? [place] 28 11/03/2020 1544  ? LDLCALC 44 07/17/2019 1022  ? ? ? ?Wt Readings from Last 3 Encounters:  ?03/16/21 155 lb 6.4 oz (70.5 kg)  ?12/15/20 162 lb 9.6 oz (73.8 kg)  ?12/07/20 168 lb (76.2 kg)  ?  ? ? ?Other studies  Reviewed: ?Additional studies/ records that were reviewed today include: TTE 07/31/20  ?Review of the above records today demonstrates:  ? 1. Left ventricular ejection fraction, by estimation, is 45 to 50%. The  ?left ventricle has mildly decreased function. Left ventricular endocardial  ?border not optimally defined to evaluate regional wall motion. There is  ?severe asymmetric left ventricular  ? hypertrophy of the septal segment. Left ventricular diastolic parameters  ?are indeterminate.  ? 2. Right ventricular systolic function is normal. The right ventricular  ?size is  normal. Tricuspid regurgitation signal is inadequate for assessing  ?PA pressure.  ? 3. Left atrial size was mildly dilated.  ? 4. The mitral valve is abnormal. Moderate mitral valve regurgitation. No  ?evidence of mitral stenosis.  ? 5. Tricuspid valve regurgitation is mild to moderate.  ? 6. The aortic valve is tricuspid. There is mild calcification of the  ?aortic valve. There is mild thickening of the aortic valve.  ? 7. Aortic dilatation noted. There is mild dilatation of the aortic root,  ?measuring 40 mm.  ? ? ?ASSESSMENT AND PLAN: ? ?1.  Sinus node dysfunction: Status post Medtronic dual-chamber pacemaker implanted 12/07/2020.  Device functioning appropriately.  No changes at this time. ? ?2.  Coronary artery disease: Status post remote CABG.  No current chest pain. ? ?3.  Hypertension: Mildly elevated today.  He Ebony Rickel call us back with his updated medication list. ? ?4.  Heart failure with reduced ejection fraction: Ejection fraction 45 to 50%.  No obvious volume overload.  Plan per primary cardiology. ? ?Current medicines are reviewed at length with the patient today.   ?The patient does not have concerns regarding his medicines.  The following changes were made today:  none ? ?Labs/ tests ordered today include:  ?Orders Placed This Encounter  ?Procedures  ? EKG 12-Lead  ? ? ? ?Disposition:   FU with Jovonne Wilton 9 months ? ?Signed, ?Kaveh Kissinger  Meredith Leeds, MD  ?03/16/2021 4:19 PM    ? ?[facility] ?[address] ?[address] ?[place] [address] ?[phone] (office) ?[phone] (fax) ? ?

## 2021-03-20 NOTE — Progress Notes (Signed)
Remote pacemaker transmission.   

## 2021-04-10 NOTE — Progress Notes (Signed)
? ?Office Visit  ?  ?Patient Name: Philip Kerr ?Date of Encounter: 04/11/2021 ? ?PCP:  Biagio Borg, MD ?  ?[facility]  ?Cardiologist:  Lauree Chandler, MD  ?Advanced Practice Provider:  No care team member to display ?Electrophysiologist:  None  ?   ? ?Chief Complaint  ?  ?Philip Kerr is a 88 y.o. male with a hx of CAD s/p CABG, ICM, left bundle branch block, diabetes, CKD 3, hypertension, COVID-19 pneumonia, s/p PPM presents today for follow up of CAD and arrhythmia ? ?Past Medical History  ?  ?Past Medical History:  ?Diagnosis Date  ? Aortic insufficiency   ? Coronary artery disease   ? coronary artery bypass graft x 5 in 1996  ? Diabetes mellitus   ? Hypercholesterolemia   ? non-insulin dependent  ? Hypertension   ? Ischemic cardiomyopathy   ? Mitral regurgitation   ? PVC (premature ventricular contraction)   ? Sinus pause 12/07/2020  ? s/p PPM  ? ?Past Surgical History:  ?Procedure Laterality Date  ? CORONARY ARTERY BYPASS GRAFT    ? LUNG SURGERY    ? OTHER SURGICAL HISTORY    ? percutaneous coronary intervention of the  posterolateral segment on 01/27/1998  ? PACEMAKER IMPLANT N/A 12/07/2020  ? Procedure: PACEMAKER IMPLANT;  Surgeon: Constance Haw, MD;  Location: [facility];  Service: Cardiovascular;  Laterality: N/A;  ? ? ?Allergies ? ?No Known Allergies ? ?History of Present Illness  ?  ?Philip Kerr is a 88 y.o. male with a hx of CAD s/p CABG, ICM, left bundle branch block, diabetes, CKD 3, hypertension, COVID-19 pneumonia, s/p PPM  last seen 03/16/21 ? ?He was admitted 07/22/2020 - 08/08/2020.  On admission he had minimal and stable elevated troponin without EKG changes and was sent home but immediately worsened due to chest pain with weakness and cough found to have transaminitis, possible bacterial pneumonia, COVID-19 positive.  Hospital course complicated by severe delirium which resolved prior to discharge, acute urinary retention requiring Foley,  hematuria requiring CBI. Non sustained episodes of V. tach and placed on beta-blocker with subsequent 8-second pauses and WCT. He was recommended for Amidarone 400mg  BID x 14 days then 400mg  QD by cardiology but was discharged on 400mg  BID x13 days then 270m QD.  Echocardiogram during admission showed LVEF improved to 45-50%, severe LVH, moderate MR. ? ?Seen 09/01/2020 for hospital follow-up.  He was having nausea and poor appetite though had started improving over the previous 3 to 4 days since his amiodarone was reduced to 200 mg daily.  He reported occasional palpitations.  He was fatigued and working with physical therapy. ? ?He was seen 11/01/20. He was concerned about elevated BP readings at home but well controlled in clinic and current medications continued. Seen 11/14/20 noting difficulty sleeping and encouraged to discuss with PCP, no snoring and no sleep study ordered. He called the office later that month noting new dizziness and live monitor revealed significant pause. Admitted 12/06/20-12/08/20 with placement of PPM by Dr. Curt Bears.  He saw Dr. Curt Bears in clinic 03/16/2021 and was doing overall well. ? ?He presents today for follow-up.Feels he is "going downhill" since having COVID last summer. Feels as if his energy level is not as good as it used to be.  He does try to stay active doing things around his home. Trouble both falling and staying asleep which has been a longstanding issue though notes he tried sleeping medication provided  by PCP last week and had a fall when he went to use the restroom.  He does have significant abrasion to the left forearm which is healing well.  Does note having some blood in the urine earlier in the week. Reports no chest pain, pressure, or tightness. No  orthopnea, PND. Reports no palpitations.  ? ?EKGs/Labs/Other Studies Reviewed:  ? ?The following studies were reviewed today: ?Echo from 07/31/20: ?  ? 1. Left ventricular ejection fraction, by estimation, is 45 to 50%. The   ?left ventricle has mildly decreased function. Left ventricular endocardial  ?border not optimally defined to evaluate regional wall motion. There is  ?severe asymmetric left ventricular hypertrophy of the septal segment. Left ventricular diastolic parameters are indeterminate.  ? 2. Right ventricular systolic function is normal. The right ventricular  ?size is normal. Tricuspid regurgitation signal is inadequate for assessing  ?PA pressure.  ? 3. Left atrial size was mildly dilated.  ? 4. The mitral valve is abnormal. Moderate mitral valve regurgitation. No  ?evidence of mitral stenosis.  ? 5. Tricuspid valve regurgitation is mild to moderate.  ? 6. The aortic valve is tricuspid. There is mild calcification of the  ?aortic valve. There is mild thickening of the aortic valve.  ? 7. Aortic dilatation noted. There is mild dilatation of the aortic root,  ?measuring 40 mm.  ?  ?Echo from 09/11/2017: ?  ?- Left ventricle: The cavity size was moderately dilated. Wall  ?  thickness was increased in a pattern of severe LVH. Systolic  ?  function was severely reduced. The estimated ejection fraction  ?  was in the range of 25% to 30%. Diffuse hypokinesis. Left  ?  ventricular diastolic function parameters were normal.  ?- Aortic valve: There was mild regurgitation.  ?- Mitral valve: There was moderate regurgitation.  ?- Left atrium: The atrium was moderately dilated.  ?- Atrial septum: No defect or patent foramen ovale was identified.  ? ?EKG:  No EKG today. ? ?Recent Labs: ?07/31/2020: B Natriuretic Peptide 4,160.4 ?11/01/2020: TSH 7.340 ?12/06/2020: ALT 28; BUN 35; Creatinine, Ser 1.68; Hemoglobin 11.5; Magnesium 2.1; Platelets 258; Potassium 4.2; Sodium 135  ?Recent Lipid Panel ?   ?Component Value Date/Time  ? CHOL 90 11/03/2020 1544  ? CHOL 98 (L) 12/01/2018 6222  ? TRIG 92.0 11/03/2020 1544  ? TRIG 102 10/22/2005 0730  ? HDL 44.00 11/03/2020 1544  ? HDL 41 12/01/2018 0808  ? CHOLHDL 2 11/03/2020 1544  ? VLDL 18.4  11/03/2020 1544  ? [place] 28 11/03/2020 1544  ? LDLCALC 44 07/17/2019 1022  ? ? ?Home Medications  ? ?Current Meds  ?Medication Sig  ? amiodarone (PACERONE) 200 MG tablet Take 1 tablet (200 mg total) by mouth daily.  ? aspirin EC 81 MG tablet Take 1 tablet (81 mg total) by mouth daily. Swallow whole.  ? docusate sodium (COLACE) 250 MG capsule Take 250 mg by mouth daily as needed for mild constipation.  ? rosuvastatin (CRESTOR) 20 MG tablet Take 1 tablet (20 mg total) by mouth daily.  ?  ?Review of Systems  ?    ?All other systems reviewed and are otherwise negative except as noted above. ? ?Physical Exam  ?  ?VS:  BP 134/86   Pulse 92   Ht 5\' 10"  (1.778 m)   Wt 157 lb 11.2 oz (71.5 kg)   SpO2 98%   BMI 22.63 kg/m?  , BMI Body mass index is 22.63 kg/m?. ? ?Wt Readings from Last 3  Encounters:  ?04/11/21 157 lb 11.2 oz (71.5 kg)  ?03/16/21 155 lb 6.4 oz (70.5 kg)  ?12/15/20 162 lb 9.6 oz (73.8 kg)  ?  ?GEN: Well nourished, well developed, in no acute distress. ?HEENT: normal. ?Neck: Supple, no JVD, carotid bruits, or masses. ?Cardiac: RRR, no murmurs, rubs, or gallops. No clubbing, cyanosis, edema.  Radials/PT 2+ and equal bilaterally.  ?Respiratory:  Respirations regular and unlabored, clear to auscultation bilaterally. ?GI: Soft, nontender, nondistended. ?MS: No deformity or atrophy. ?Skin: Warm and dry, no rash. ?Neuro:  Strength and sensation are intact. ?Psych: Normal affect. ? ?Assessment & Plan  ?  ?Wide-complex tachycardia while admitted 08/06/2020 which was asymptomatic / NST / High grade AV block with discontinuation of BB / Amiodarone / LBBB / s/p PPM - Chronic LBBB. Previously on beta-blocker for GDMT for CHF and NSVT.  However had to be discontinued during 07/2020  admission due to pauses of greater than 8 seconds of high-grade AV block along with WCT. Admission 11/2020 for PPM. Follows with Dr. Curt Bears. Continue Amiodarone 200mg  QD. 11/01/20 normal LFTs, elevated TSH 7.34 but normal T3 and T4. Update  CMP, thyroid panel for monitoring. Add on CBC due to fatigue. If unremarkable, further workup per PCP. ? ?CAD remote CABG - Stable with no anginal symptoms. No indication for ischemic evaluation. Patient prefers c

## 2021-04-11 ENCOUNTER — Encounter (HOSPITAL_BASED_OUTPATIENT_CLINIC_OR_DEPARTMENT_OTHER): Payer: Self-pay | Admitting: Family

## 2021-04-11 ENCOUNTER — Ambulatory Visit (HOSPITAL_BASED_OUTPATIENT_CLINIC_OR_DEPARTMENT_OTHER): Payer: Medicare HMO | Admitting: Family

## 2021-04-11 VITALS — BP 134/86 | HR 92 | Ht 70.0 in | Wt 157.7 lb

## 2021-04-11 DIAGNOSIS — Z79899 Other long term (current) drug therapy: Secondary | ICD-10-CM

## 2021-04-11 DIAGNOSIS — Z950 Presence of cardiac pacemaker: Secondary | ICD-10-CM

## 2021-04-11 DIAGNOSIS — E785 Hyperlipidemia, unspecified: Secondary | ICD-10-CM

## 2021-04-11 DIAGNOSIS — I25118 Atherosclerotic heart disease of native coronary artery with other forms of angina pectoris: Secondary | ICD-10-CM | POA: Diagnosis not present

## 2021-04-11 DIAGNOSIS — I5042 Chronic combined systolic (congestive) and diastolic (congestive) heart failure: Secondary | ICD-10-CM

## 2021-04-11 DIAGNOSIS — R5383 Other fatigue: Secondary | ICD-10-CM

## 2021-04-11 NOTE — Patient Instructions (Addendum)
Medication Instructions:  ?Continue your current medications. ? ?*If you need a refill on your cardiac medications before your next appointment, please call your pharmacy* ? ? ?Lab Work: ?Your physician recommends that you return for lab work today: CMP, thyroid panel, CBC  ? ?If you have labs (blood work) drawn today and your tests are completely normal, you will receive your results only by: ?MyChart Message (if you have MyChart) OR ?A paper copy in the mail ?If you have any lab test that is abnormal or we need to change your treatment, we will call you to review the results. ? ? ?Testing/Procedures: ?None ordered today.  ? ? ?Follow-Up: ?At [facility], you and your health needs are our priority.  As part of our continuing mission to provide you with exceptional heart care, we have created designated Provider Care Teams.  These Care Teams include your primary Cardiologist (physician) and Advanced Practice Providers (APPs -  Physician Assistants and Nurse Practitioners) who all work together to provide you with the care you need, when you need it. ? ?We recommend signing up for the patient portal called "MyChart".  Sign up information is provided on this After Visit Summary.  MyChart is used to connect with patients for Virtual Visits (Telemedicine).  Patients are able to view lab/test results, encounter notes, upcoming appointments, etc.  Non-urgent messages can be sent to your provider as well.   ?To learn more about what you can do with MyChart, go to [email or web address].   ? ?Your next appointment:   ?In September as scheduled with Dr. Angelena Form ? ? ?Other Instructions ? ?Heart Healthy Diet Recommendations: ?A low-salt diet is recommended. Meats should be grilled, baked, or boiled. Avoid fried foods. Focus on lean protein sources like fish or chicken with vegetables and fruits. The American Heart Association is a Microbiologist!  American Heart Association Diet and Lifeystyle Recommendations  ? ?Exercise  recommendations: ?The American Heart Association recommends 150 minutes of moderate intensity exercise weekly. ?Try 30 minutes of moderate intensity exercise 4-5 times per week. ?This could include walking, jogging, or swimming. ?  ?

## 2021-04-12 ENCOUNTER — Telehealth (HOSPITAL_BASED_OUTPATIENT_CLINIC_OR_DEPARTMENT_OTHER): Payer: Self-pay

## 2021-04-12 LAB — COMPREHENSIVE METABOLIC PANEL
ALT: 25 IU/L (ref 0–44)
AST: 24 IU/L (ref 0–40)
Albumin/Globulin Ratio: 1.4 (ref 1.2–2.2)
Albumin: 4.2 g/dL (ref 3.6–4.6)
Alkaline Phosphatase: 83 IU/L (ref 44–121)
BUN/Creatinine Ratio: 39 — ABNORMAL HIGH (ref 10–24)
BUN: 57 mg/dL — ABNORMAL HIGH (ref 8–27)
Bilirubin Total: 0.3 mg/dL (ref 0.0–1.2)
CO2: 21 mmol/L (ref 20–29)
Calcium: 9.7 mg/dL (ref 8.6–10.2)
Chloride: 100 mmol/L (ref 96–106)
Creatinine, Ser: 1.48 mg/dL — ABNORMAL HIGH (ref 0.76–1.27)
Globulin, Total: 3.1 g/dL (ref 1.5–4.5)
Glucose: 130 mg/dL — ABNORMAL HIGH (ref 70–99)
Potassium: 5.2 mmol/L (ref 3.5–5.2)
Sodium: 133 mmol/L — ABNORMAL LOW (ref 134–144)
Total Protein: 7.3 g/dL (ref 6.0–8.5)
eGFR: 46 mL/min/{1.73_m2} — ABNORMAL LOW (ref >59)

## 2021-04-12 LAB — THYROID PANEL WITH TSH
Free Thyroxine Index: 1.3 (ref 1.2–4.9)
T3 Uptake Ratio: 27 % (ref 24–39)
T4, Total: 4.7 ug/dL (ref 4.5–12.0)
TSH: 4.2 u[IU]/mL (ref 0.450–4.500)

## 2021-04-12 LAB — CBC
Hematocrit: 36.2 % — ABNORMAL LOW (ref 37.5–51.0)
Hemoglobin: 12.1 g/dL — ABNORMAL LOW (ref 13.0–17.7)
MCH: 33.1 pg — ABNORMAL HIGH (ref 26.6–33.0)
MCHC: 33.4 g/dL (ref 31.5–35.7)
MCV: 99 fL — ABNORMAL HIGH (ref 79–97)
Platelets: 343 10*3/uL (ref 150–450)
RBC: 3.66 x10E6/uL — ABNORMAL LOW (ref 4.14–5.80)
RDW: 13.1 % (ref 11.6–15.4)
WBC: 13.6 10*3/uL — ABNORMAL HIGH (ref 3.4–10.8)

## 2021-04-12 NOTE — Telephone Encounter (Addendum)
Results called to patient who verbalizes understanding!  ? ? ? ? ?----- Message from Loel Dubonnet, NP sent at 04/12/2021  8:06 AM EDT ----- ?Stable kidney function. Normal potassium. Sodium mildly low - ensure restricting to less than 2 liters of fluid per day. Normal thyroid function. Stable mild anemia. There is elevation in white blood cell count which could indicate infection - if he has any symptoms such as cough, chills, fever, dysuria recommend he reach out to primary care. ?

## 2021-04-17 ENCOUNTER — Encounter: Payer: Self-pay | Admitting: Internal Medicine

## 2021-04-22 ENCOUNTER — Emergency Department (HOSPITAL_COMMUNITY)
Admission: EM | Admit: 2021-04-22 | Discharge: 2021-04-23 | Disposition: A | Discharge status: Home / Self Care | Payer: Medicare HMO | Attending: Emergency Medicine | Admitting: Emergency Medicine

## 2021-04-22 ENCOUNTER — Encounter (HOSPITAL_COMMUNITY): Payer: Self-pay

## 2021-04-22 ENCOUNTER — Other Ambulatory Visit: Payer: Self-pay

## 2021-04-22 DIAGNOSIS — R339 Retention of urine, unspecified: Secondary | ICD-10-CM | POA: Diagnosis not present

## 2021-04-22 DIAGNOSIS — Z7982 Long term (current) use of aspirin: Secondary | ICD-10-CM | POA: Diagnosis not present

## 2021-04-22 DIAGNOSIS — N390 Urinary tract infection, site not specified: Secondary | ICD-10-CM | POA: Diagnosis not present

## 2021-04-22 NOTE — ED Triage Notes (Signed)
Pt arrived POV from home c/o difficulty urinating x2 days. Per pt sometimes he can go sometimes he cannot at all. Pt has not been able to go all day he states.  ?

## 2021-04-22 NOTE — ED Provider Notes (Signed)
? ?[facility]  ?Provider Note ? ?CSN: [id number] ?Arrival date & time: 04/22/21 2221 ? ?History ?Chief Complaint  ?Patient presents with  ? Urinary Retention  ? ? ?Philip Kerr is a 88 y.o. male brought to the ED by wife who supplements history. He has had difficulty urinating for the last 2 days and no urine output at all today. Complaining of suprapubic pain/spasms. He had a catheter during an admission last year and is on ditropan and flomax as well. No fevers, vomiting or flank pain.  ? ? ?Home Medications ?Prior to Admission medications   ?Medication Sig Start Date End Date Taking? Authorizing Provider  ?acetaminophen (TYLENOL) 325 MG tablet Take 650 mg by mouth every 6 (six) hours as needed for moderate pain or headache.   Yes [provider]  ?amiodarone (PACERONE) 200 MG tablet Take 1 tablet (200 mg total) by mouth daily. 01/13/21  Yes Loel Dubonnet, NP  ?Ascorbic Acid (VITAMIN C) 1000 MG tablet Take 1,000 mg by mouth daily.   Yes [provider]  ?aspirin EC 81 MG tablet Take 1 tablet (81 mg total) by mouth daily. Swallow whole. 12/12/20  Yes Shirley Friar, PA-C  ?bisacodyl (DULCOLAX) 10 MG suppository Place 10 mg rectally as needed for moderate constipation.   Yes [provider]  ?cephALEXin (KEFLEX) 500 MG capsule Take 1 capsule (500 mg total) by mouth 2 (two) times daily for 7 days. 04/23/21 04/30/21 Yes Truddie Hidden, MD  ?Cholecalciferol (VITAMIN D3) 10 MCG (400 UNIT) tablet Take 400 Units by mouth daily.   Yes [provider]  ?Cinnamon 500 MG capsule Take 1,000 mg by mouth daily.   Yes [provider]  ?diphenhydramine-acetaminophen (TYLENOL PM) 25-500 MG TABS tablet Take 2 tablets by mouth at bedtime.   Yes [provider]  ?docusate sodium (COLACE) 250 MG capsule Take 250 mg by mouth daily as needed for mild constipation.   Yes [provider]  ?furosemide (LASIX) 20 MG tablet  Take 1 tablet (20 mg total) by mouth as needed. ?Patient taking differently: Take 20 mg by mouth as needed for fluid or edema. 11/14/20 04/23/21 Yes Loel Dubonnet, NP  ?ondansetron (ZOFRAN) 4 MG tablet Take 1 tablet (4 mg total) by mouth every 8 (eight) hours as needed for nausea or vomiting. 12/13/20  Yes Biagio Borg, MD  ?oxybutynin (DITROPAN) 5 MG tablet TAKE 1 TABLET EVERY 8 HOURS AS NEEDED FOR UP TO 14 DAYS FOR BLADDER SPASMS. ?Patient taking differently: Take 5 mg by mouth 2 (two) times daily. 09/08/20  Yes Biagio Borg, MD  ?rosuvastatin (CRESTOR) 20 MG tablet Take 1 tablet (20 mg total) by mouth daily. 09/01/20  Yes Loel Dubonnet, NP  ?tamsulosin (FLOMAX) 0.4 MG CAPS capsule Take 0.4 mg by mouth every evening. 12/01/20  Yes [provider]  ?vitamin E 180 MG (400 UNITS) capsule Take 400 Units by mouth daily.   Yes [provider]  ?QUEtiapine (SEROQUEL) 50 MG tablet Take 1 tablet (50 mg total) by mouth at bedtime. ?Patient not taking: Reported on 04/23/2021 11/03/20   Biagio Borg, MD  ?traZODone (DESYREL) 50 MG tablet TAKE 1/2 TO 1 TABLET BY MOUTH AT BEDTIME AS NEEDED FOR SLEEP ?Patient not taking: Reported on 04/23/2021 12/03/20   Biagio Borg, MD  ? ? ? ?Allergies    ?Patient has no known allergies. ? ? ?Review of Systems   ?Review of Systems ?Please see HPI  for pertinent positives and negatives ? ?Physical Exam ?BP (!) 109/53   Pulse 90   Temp 98.2 ?F (36.8 ?C) (Oral)   Resp 15   Ht 5\' 10"  (1.778 m)   Wt 71.7 kg   SpO2 97%   BMI 22.67 kg/m?  ? ?Physical Exam ?Vitals and nursing note reviewed.  ?Constitutional:   ?   Appearance: Normal appearance.  ?HENT:  ?   Head: Normocephalic and atraumatic.  ?   Nose: Nose normal.  ?   Mouth/Throat:  ?   Mouth: Mucous membranes are moist.  ?Eyes:  ?   Extraocular Movements: Extraocular movements intact.  ?   Conjunctiva/sclera: Conjunctivae normal.  ?Cardiovascular:  ?   Rate and Rhythm: Normal rate.  ?Pulmonary:  ?   Effort: Pulmonary  effort is normal.  ?   Breath sounds: Normal breath sounds.  ?Abdominal:  ?   General: Abdomen is flat.  ?   Palpations: Abdomen is soft.  ?   Tenderness: There is abdominal tenderness (suprapubic, distended bladder).  ?Musculoskeletal:     ?   General: No swelling. Normal range of motion.  ?   Cervical back: Neck supple.  ?Skin: ?   General: Skin is warm and dry.  ?Neurological:  ?   General: No focal deficit present.  ?   Mental Status: He is alert.  ?Psychiatric:     ?   Mood and Affect: Mood normal.  ? ? ?ED Results / Procedures / Treatments   ?EKG ?None ? ?Procedures ?Procedures ? ?Medications Ordered in the ED ?Medications  ?cephALEXin (KEFLEX) capsule 500 mg (has no administration in time range)  ? ? ?Initial Impression and Plan ? Patient here with acute urinary retention. Bladder scan in triage reportedly showed >800cc. Foley ordered, will check UA for signs of infection.  ? ?ED Course  ? ?Clinical Course as of 04/23/21 0131  ?Sun Apr 23, 2021  ?0112 UA is concerning for infection which may be the underlying cause of his retention. Will begin Keflex, leg bag and outpatient Urology follow up.  [CS]  ?  ?Clinical Course User Index ?[CS] Truddie Hidden, MD  ? ? ? ?MDM Rules/Calculators/A&P ?Medical Decision Making ?Problems Addressed: ?Lower urinary tract infectious disease: acute illness or injury ?Urinary retention: acute illness or injury ? ?Amount and/or Complexity of Data Reviewed ?Labs: ordered. Decision-making details documented in ED Course. ? ?Risk ?Prescription drug management. ? ? ? ?Final Clinical Impression(s) / ED Diagnoses ?Final diagnoses:  ?Urinary retention  ?Lower urinary tract infectious disease  ? ? ?Rx / DC Orders ?ED Discharge Orders   ? ?      Ordered  ?  cephALEXin (KEFLEX) 500 MG capsule  2 times daily       ? 04/23/21 0130  ? ?  ?  ? ?  ? ?  ?Truddie Hidden, MD ?04/23/21 0131 ? ?

## 2021-04-23 LAB — URINALYSIS, ROUTINE W REFLEX MICROSCOPIC
Bilirubin Urine: NEGATIVE
Glucose, UA: NEGATIVE mg/dL
Hgb urine dipstick: NEGATIVE
Ketones, ur: NEGATIVE mg/dL
Nitrite: NEGATIVE
Protein, ur: 100 mg/dL — AB
Specific Gravity, Urine: 1.011 (ref 1.005–1.030)
WBC, UA: >50 WBC/hpf — ABNORMAL HIGH (ref 0–5)
pH: 6 (ref 5.0–8.0)

## 2021-04-23 MED ORDER — CEPHALEXIN 500 MG PO CAPS
500.0000 mg | ORAL_CAPSULE | Freq: Two times a day (BID) | ORAL | 0 refills | Status: AC
Start: 2021-04-23 — End: 2021-04-30

## 2021-04-23 MED ORDER — CEPHALEXIN 250 MG PO CAPS
500.0000 mg | ORAL_CAPSULE | Freq: Once | ORAL | Status: AC
  Administered 2021-04-23: 500 mg via ORAL
  Filled 2021-04-23: qty 2

## 2021-04-25 DIAGNOSIS — N281 Cyst of kidney, acquired: Secondary | ICD-10-CM | POA: Diagnosis not present

## 2021-04-25 DIAGNOSIS — N401 Enlarged prostate with lower urinary tract symptoms: Secondary | ICD-10-CM | POA: Diagnosis not present

## 2021-04-25 DIAGNOSIS — N210 Calculus in bladder: Secondary | ICD-10-CM | POA: Diagnosis not present

## 2021-04-25 DIAGNOSIS — R338 Other retention of urine: Secondary | ICD-10-CM | POA: Diagnosis not present

## 2021-04-25 DIAGNOSIS — R310 Gross hematuria: Secondary | ICD-10-CM | POA: Diagnosis not present

## 2021-04-25 DIAGNOSIS — E1122 Type 2 diabetes mellitus with diabetic chronic kidney disease: Secondary | ICD-10-CM | POA: Diagnosis not present

## 2021-05-01 DIAGNOSIS — I129 Hypertensive chronic kidney disease with stage 1 through stage 4 chronic kidney disease, or unspecified chronic kidney disease: Secondary | ICD-10-CM | POA: Diagnosis not present

## 2021-05-01 DIAGNOSIS — R339 Retention of urine, unspecified: Secondary | ICD-10-CM | POA: Diagnosis not present

## 2021-05-01 DIAGNOSIS — Z436 Encounter for attention to other artificial openings of urinary tract: Secondary | ICD-10-CM | POA: Diagnosis not present

## 2021-05-01 DIAGNOSIS — N179 Acute kidney failure, unspecified: Secondary | ICD-10-CM | POA: Diagnosis not present

## 2021-05-01 DIAGNOSIS — I5023 Acute on chronic systolic (congestive) heart failure: Secondary | ICD-10-CM | POA: Diagnosis not present

## 2021-05-01 DIAGNOSIS — N1832 Chronic kidney disease, stage 3b: Secondary | ICD-10-CM | POA: Diagnosis not present

## 2021-05-01 DIAGNOSIS — E1122 Type 2 diabetes mellitus with diabetic chronic kidney disease: Secondary | ICD-10-CM | POA: Diagnosis not present

## 2021-05-12 ENCOUNTER — Ambulatory Visit (INDEPENDENT_AMBULATORY_CARE_PROVIDER_SITE_OTHER): Payer: Medicare HMO

## 2021-05-12 ENCOUNTER — Encounter: Payer: Self-pay | Admitting: Family Medicine

## 2021-05-12 ENCOUNTER — Ambulatory Visit (INDEPENDENT_AMBULATORY_CARE_PROVIDER_SITE_OTHER): Payer: Medicare HMO | Admitting: Family Medicine

## 2021-05-12 VITALS — BP 130/60 | HR 70 | Temp 97.8°F | Ht 70.0 in | Wt 155.0 lb

## 2021-05-12 DIAGNOSIS — R0602 Shortness of breath: Secondary | ICD-10-CM | POA: Diagnosis not present

## 2021-05-12 DIAGNOSIS — R0989 Other specified symptoms and signs involving the circulatory and respiratory systems: Secondary | ICD-10-CM | POA: Diagnosis not present

## 2021-05-12 DIAGNOSIS — R058 Other specified cough: Secondary | ICD-10-CM

## 2021-05-12 DIAGNOSIS — J9811 Atelectasis: Secondary | ICD-10-CM | POA: Diagnosis not present

## 2021-05-12 DIAGNOSIS — R059 Cough, unspecified: Secondary | ICD-10-CM | POA: Diagnosis not present

## 2021-05-12 MED ORDER — SULFAMETHOXAZOLE-TRIMETHOPRIM 800-160 MG PO TABS: 1.0000 | ORAL_TABLET | Freq: Two times a day (BID) | ORAL | 0 refills | Status: DC

## 2021-05-12 MED ORDER — BENZONATATE 200 MG PO CAPS: 200.0000 mg | ORAL_CAPSULE | Freq: Two times a day (BID) | ORAL | 0 refills | Status: DC | PRN

## 2021-05-12 NOTE — Patient Instructions (Signed)
Go to the first floor for a chest X ray. ? ?Start the antibiotic and take it as prescribed. ? ?You can try over-the-counter Mucinex (just the plain and not DM) ? ?I also prescribed Tessalon Perles for cough.  Ask your pharmacy to give you the cash price which should be $10 or use a good Rx coupon ? ?We will be in touch with your x-ray results ? ?Follow-up next week if you are not improving significantly ?

## 2021-05-12 NOTE — Progress Notes (Signed)
? ?Subjective:  ? ? ? Patient ID: Philip Kerr, male    DOB: 1933-07-16, 88 y.o.   MRN: [id number] ? ?Chief Complaint  ?Patient presents with  ? Shortness of Breath  ?  Started last night, states it feels as if it is his left lung. Describes as fullness in his left lung. ? ?Hx of lung surgery.   ? ? ?Shortness of Breath ? ?Patient is in today for cough productive of thick sputum x 2-3 days and mild shortness of breath.  ? ?Hx of lung surgery, Covid infection and pneumonia per wife.  ? ?Denies fever, chills, dizziness, chest pain, palpitations, abdominal pain, N/V/D, urinary symptoms, LE edema.  ? ? ?Health Maintenance Due  ?Topic Date Due  ? OPHTHALMOLOGY EXAM  Never done  ? HEMOGLOBIN A1C  05/04/2021  ? ? ?Past Medical History:  ?Diagnosis Date  ? Aortic insufficiency   ? Coronary artery disease   ? coronary artery bypass graft x 5 in 1996  ? Diabetes mellitus   ? Hypercholesterolemia   ? non-insulin dependent  ? Hypertension   ? Ischemic cardiomyopathy   ? Mitral regurgitation   ? PVC (premature ventricular contraction)   ? Sinus pause 12/07/2020  ? s/p PPM  ? ? ?Past Surgical History:  ?Procedure Laterality Date  ? CORONARY ARTERY BYPASS GRAFT    ? LUNG SURGERY    ? OTHER SURGICAL HISTORY    ? percutaneous coronary intervention of the  posterolateral segment on 01/27/1998  ? PACEMAKER IMPLANT N/A 12/07/2020  ? Procedure: PACEMAKER IMPLANT;  Surgeon: Constance Haw, MD;  Location: [facility];  Service: Cardiovascular;  Laterality: N/A;  ? ? ?Family History  ?Adopted: Yes  ?Problem Relation Age of Onset  ? CAD Neg Hx   ? ? ?Social History  ? ?Socioeconomic History  ? Marital status: Married  ?  Spouse name: Not on file  ? Number of children: 1  ? Years of education: Not on file  ? Highest education level: Not on file  ?Occupational History  ? Occupation: Runs a Arts administrator  ?  Employer: SELF-EMPLOYED  ?Tobacco Use  ? Smoking status: Never  ? Smokeless tobacco: Never  ?Vaping Use  ? Vaping Use:  Never used  ?Substance and Sexual Activity  ? Alcohol use: No  ? Drug use: No  ? Sexual activity: Not Currently  ?Other Topics Concern  ? Not on file  ?Social History Narrative  ? Not on file  ? ?Social Determinants of Health  ? ?Financial Resource Strain: Not on file  ?Food Insecurity: Not on file  ?Transportation Needs: Not on file  ?Physical Activity: Not on file  ?Stress: Not on file  ?Social Connections: Not on file  ?Intimate Partner Violence: Not on file  ? ? ?Outpatient Medications Prior to Visit  ?Medication Sig Dispense Refill  ? acetaminophen (TYLENOL) 325 MG tablet Take 650 mg by mouth every 6 (six) hours as needed for moderate pain or headache.    ? amiodarone (PACERONE) 200 MG tablet Take 1 tablet (200 mg total) by mouth daily. 90 tablet 3  ? Ascorbic Acid (VITAMIN C) 1000 MG tablet Take 1,000 mg by mouth daily.    ? aspirin EC 81 MG tablet Take 1 tablet (81 mg total) by mouth daily. Swallow whole. 30 tablet 11  ? bisacodyl (DULCOLAX) 10 MG suppository Place 10 mg rectally as needed for moderate constipation.    ? Cholecalciferol (VITAMIN D3) 10 MCG (400 UNIT) tablet Take  400 Units by mouth daily.    ? Cinnamon 500 MG capsule Take 1,000 mg by mouth daily.    ? diphenhydramine-acetaminophen (TYLENOL PM) 25-500 MG TABS tablet Take 2 tablets by mouth at bedtime.    ? docusate sodium (COLACE) 250 MG capsule Take 250 mg by mouth daily as needed for mild constipation.    ? ondansetron (ZOFRAN) 4 MG tablet Take 1 tablet (4 mg total) by mouth every 8 (eight) hours as needed for nausea or vomiting. 20 tablet 0  ? oxybutynin (DITROPAN) 5 MG tablet TAKE 1 TABLET EVERY 8 HOURS AS NEEDED FOR UP TO 14 DAYS FOR BLADDER SPASMS. (Patient taking differently: Take 5 mg by mouth 2 (two) times daily.) 270 tablet 1  ? QUEtiapine (SEROQUEL) 50 MG tablet Take 1 tablet (50 mg total) by mouth at bedtime. 90 tablet 3  ? rosuvastatin (CRESTOR) 20 MG tablet Take 1 tablet (20 mg total) by mouth daily. 90 tablet 3  ? tamsulosin  (FLOMAX) 0.4 MG CAPS capsule Take 0.4 mg by mouth every evening.    ? traZODone (DESYREL) 50 MG tablet TAKE 1/2 TO 1 TABLET BY MOUTH AT BEDTIME AS NEEDED FOR SLEEP 90 tablet 1  ? vitamin E 180 MG (400 UNITS) capsule Take 400 Units by mouth daily.    ? furosemide (LASIX) 20 MG tablet Take 1 tablet (20 mg total) by mouth as needed. (Patient taking differently: Take 20 mg by mouth as needed for fluid or edema.) 30 tablet 2  ? ?No facility-administered medications prior to visit.  ? ? ?No Known Allergies ? ?Review of Systems  ?Respiratory:  Positive for shortness of breath.   ?Pertinent positives and negatives in the history of present illness. ? ?   ?Objective:  ?  ?Physical Exam ?Constitutional:   ?   General: He is not in acute distress. ?   Appearance: He is not ill-appearing.  ?Cardiovascular:  ?   Rate and Rhythm: Normal rate.  ?Pulmonary:  ?   Effort: Pulmonary effort is normal.  ?   Breath sounds: Examination of the right-lower field reveals rhonchi. Rhonchi present.  ?Musculoskeletal:  ?   Cervical back: Normal range of motion and neck supple.  ?Skin: ?   General: Skin is warm and dry.  ?Neurological:  ?   General: No focal deficit present.  ?   Mental Status: He is alert.  ?Psychiatric:     ?   Mood and Affect: Mood normal.  ? ? ?BP 130/60 (BP Location: Left Arm, Patient Position: Sitting, Cuff Size: Large)   Pulse 70   Temp 97.8 ?F (36.6 ?C) (Temporal)   Ht 5\' 10"  (1.778 m)   Wt 155 lb (70.3 kg)   SpO2 98%   BMI 22.24 kg/m?  ?Wt Readings from Last 3 Encounters:  ?05/12/21 155 lb (70.3 kg)  ?04/22/21 158 lb (71.7 kg)  ?04/11/21 157 lb 11.2 oz (71.5 kg)  ? ? ?   ?Assessment & Plan:  ? ?Problem List Items Addressed This Visit   ?None ?Visit Diagnoses   ? ? Productive cough    -  Primary  ? Relevant Medications  ? sulfamethoxazole-trimethoprim (BACTRIM DS) 800-160 MG tablet  ? benzonatate (TESSALON) 200 MG capsule  ? Other Relevant Orders  ? DG Chest 2 View (Completed)  ? Shortness of breath      ? Relevant  Orders  ? DG Chest 2 View (Completed)  ? ?  ? ?I will send him for chest x-ray.  Antibiotic  and Tessalon Perles prescribed.  He is not in any acute distress.  Euvolemic.  He may try Mucinex.  Follow-up if worsening or not back to baseline when he completes the antibiotic. ? ? ?I am having Pilar Plate B. Bencivenga start on sulfamethoxazole-trimethoprim and benzonatate. I am also having him maintain his Vitamin D3, vitamin E, rosuvastatin, oxybutynin, QUEtiapine, furosemide, traZODone, tamsulosin, vitamin C, docusate sodium, Cinnamon, aspirin EC, ondansetron, amiodarone, diphenhydramine-acetaminophen, bisacodyl, and acetaminophen. ? ?Meds ordered this encounter  ?Medications  ? sulfamethoxazole-trimethoprim (BACTRIM DS) 800-160 MG tablet  ?  Sig: Take 1 tablet by mouth 2 (two) times daily.  ?  Dispense:  14 tablet  ?  Refill:  0  ?  Order Specific Question:   Supervising Provider  ?  Answer:   Pricilla Holm A [[id number]]  ? benzonatate (TESSALON) 200 MG capsule  ?  Sig: Take 1 capsule (200 mg total) by mouth 2 (two) times daily as needed for cough.  ?  Dispense:  20 capsule  ?  Refill:  0  ?  Order Specific Question:   Supervising Provider  ?  Answer:   Pricilla Holm A [[id number]]  ? ? ?

## 2021-05-19 DIAGNOSIS — R338 Other retention of urine: Secondary | ICD-10-CM | POA: Diagnosis not present

## 2021-05-19 DIAGNOSIS — R310 Gross hematuria: Secondary | ICD-10-CM | POA: Diagnosis not present

## 2021-05-22 DIAGNOSIS — R338 Other retention of urine: Secondary | ICD-10-CM | POA: Diagnosis not present

## 2021-05-23 ENCOUNTER — Encounter (HOSPITAL_BASED_OUTPATIENT_CLINIC_OR_DEPARTMENT_OTHER): Payer: Self-pay

## 2021-05-23 ENCOUNTER — Emergency Department (HOSPITAL_BASED_OUTPATIENT_CLINIC_OR_DEPARTMENT_OTHER): Payer: Medicare HMO

## 2021-05-23 ENCOUNTER — Emergency Department (HOSPITAL_BASED_OUTPATIENT_CLINIC_OR_DEPARTMENT_OTHER)
Admission: EM | Admit: 2021-05-23 | Discharge: 2021-05-23 | Disposition: A | Discharge status: Home / Self Care | Payer: Medicare HMO | Attending: Emergency Medicine | Admitting: Emergency Medicine

## 2021-05-23 ENCOUNTER — Other Ambulatory Visit: Payer: Self-pay

## 2021-05-23 DIAGNOSIS — Z950 Presence of cardiac pacemaker: Secondary | ICD-10-CM | POA: Insufficient documentation

## 2021-05-23 DIAGNOSIS — Z7982 Long term (current) use of aspirin: Secondary | ICD-10-CM | POA: Insufficient documentation

## 2021-05-23 DIAGNOSIS — E119 Type 2 diabetes mellitus without complications: Secondary | ICD-10-CM | POA: Diagnosis not present

## 2021-05-23 DIAGNOSIS — Z960 Presence of urogenital implants: Secondary | ICD-10-CM | POA: Diagnosis not present

## 2021-05-23 DIAGNOSIS — Z79899 Other long term (current) drug therapy: Secondary | ICD-10-CM | POA: Insufficient documentation

## 2021-05-23 DIAGNOSIS — N281 Cyst of kidney, acquired: Secondary | ICD-10-CM | POA: Diagnosis not present

## 2021-05-23 DIAGNOSIS — I10 Essential (primary) hypertension: Secondary | ICD-10-CM | POA: Insufficient documentation

## 2021-05-23 DIAGNOSIS — R339 Retention of urine, unspecified: Secondary | ICD-10-CM | POA: Insufficient documentation

## 2021-05-23 DIAGNOSIS — T83098A Other mechanical complication of other indwelling urethral catheter, initial encounter: Secondary | ICD-10-CM | POA: Diagnosis not present

## 2021-05-23 DIAGNOSIS — R34 Anuria and oliguria: Secondary | ICD-10-CM | POA: Insufficient documentation

## 2021-05-23 DIAGNOSIS — Z978 Presence of other specified devices: Secondary | ICD-10-CM

## 2021-05-23 DIAGNOSIS — I2510 Atherosclerotic heart disease of native coronary artery without angina pectoris: Secondary | ICD-10-CM | POA: Insufficient documentation

## 2021-05-23 LAB — BASIC METABOLIC PANEL
Anion gap: 9 (ref 5–15)
BUN: 37 mg/dL — ABNORMAL HIGH (ref 8–23)
CO2: 21 mmol/L — ABNORMAL LOW (ref 22–32)
Calcium: 9.1 mg/dL (ref 8.9–10.3)
Chloride: 106 mmol/L (ref 98–111)
Creatinine, Ser: 1.75 mg/dL — ABNORMAL HIGH (ref 0.61–1.24)
GFR, Estimated: 38 mL/min — ABNORMAL LOW (ref >60)
Glucose, Bld: 102 mg/dL — ABNORMAL HIGH (ref 70–99)
Potassium: 4.7 mmol/L (ref 3.5–5.1)
Sodium: 136 mmol/L (ref 135–145)

## 2021-05-23 LAB — URINALYSIS, ROUTINE W REFLEX MICROSCOPIC
Bilirubin Urine: NEGATIVE
Glucose, UA: NEGATIVE mg/dL
Ketones, ur: NEGATIVE mg/dL
Nitrite: NEGATIVE
RBC / HPF: >50 RBC/hpf — ABNORMAL HIGH (ref 0–5)
Specific Gravity, Urine: 1.016 (ref 1.005–1.030)
WBC, UA: >50 WBC/hpf — ABNORMAL HIGH (ref 0–5)
pH: 5 (ref 5.0–8.0)

## 2021-05-23 LAB — CBC WITH DIFFERENTIAL/PLATELET
Abs Immature Granulocytes: 0.02 10*3/uL (ref 0.00–0.07)
Basophils Absolute: 0.1 10*3/uL (ref 0.0–0.1)
Basophils Relative: 1 %
Eosinophils Absolute: 0.4 10*3/uL (ref 0.0–0.5)
Eosinophils Relative: 4 %
HCT: 32.5 % — ABNORMAL LOW (ref 39.0–52.0)
Hemoglobin: 10.3 g/dL — ABNORMAL LOW (ref 13.0–17.0)
Immature Granulocytes: 0 %
Lymphocytes Relative: 51 %
Lymphs Abs: 4.6 10*3/uL — ABNORMAL HIGH (ref 0.7–4.0)
MCH: 32 pg (ref 26.0–34.0)
MCHC: 31.7 g/dL (ref 30.0–36.0)
MCV: 100.9 fL — ABNORMAL HIGH (ref 80.0–100.0)
Monocytes Absolute: 0.5 10*3/uL (ref 0.1–1.0)
Monocytes Relative: 5 %
Neutro Abs: 3.5 10*3/uL (ref 1.7–7.7)
Neutrophils Relative %: 39 %
Platelets: 224 10*3/uL (ref 150–400)
RBC: 3.22 MIL/uL — ABNORMAL LOW (ref 4.22–5.81)
RDW: 13.6 % (ref 11.5–15.5)
WBC: 9.1 10*3/uL (ref 4.0–10.5)
nRBC: 0 % (ref 0.0–0.2)

## 2021-05-23 NOTE — Discharge Instructions (Signed)
Follow-up with urologist next week for reevaluation.  You are slightly anemic, I would get your blood work repeated in the next month or so.  Follow-up with your primary later this week. ?

## 2021-05-23 NOTE — ED Notes (Signed)
Patient verbalizes understanding of discharge instructions. Opportunity for questioning and answers were provided. Armband removed by staff, pt discharged from ED. Ambulated out to lobby  

## 2021-05-23 NOTE — ED Notes (Signed)
Catheter changed out to leg bag ?

## 2021-05-23 NOTE — ED Notes (Signed)
Pt bladder scanned in triage with maximum volume of 303 mL.  ?

## 2021-05-23 NOTE — ED Triage Notes (Signed)
Pt states he was seen yesterday at urologist's office for evaluation of catheter issue. Pt had his catheter replaced and reportedly needed flushing due to "a lot of blood". Since yesterday pt has had little to no output from his foley.  ?

## 2021-05-23 NOTE — ED Provider Notes (Signed)
?[facility] ?Provider Note ? ? ?CSN: [id number] ?Arrival date & time: 05/23/21  1710 ? ?  ? ?History ? ?Chief Complaint  ?Patient presents with  ? Anuria  ? ? ?Philip Kerr is a 88 y.o. male. ? ?HPI ? ?Patient with medical history notable for diabetes, hypertension, ischemic cardiomyopathy, CAD, pacemaker presents today due to urinary retention.  Patient states she started having hematuria a week ago.  Was seen by his urologist yesterday and a Foley catheter was inserted.  There was significant amount of blood per patient's wife.  Patient has had very little output since discharge home, presents today due to concern that the Foley catheter is clogged.  No fevers, no nausea, no vomiting, denies any chest pain or feeling short of breath.  States he has been requiring a Foley catheter off and on since July of last year.  Is followed by [facility]. ? ?Home Medications ?Prior to Admission medications   ?Medication Sig Start Date End Date Taking? Authorizing Provider  ?acetaminophen (TYLENOL) 325 MG tablet Take 650 mg by mouth every 6 (six) hours as needed for moderate pain or headache.    [provider]  ?amiodarone (PACERONE) 200 MG tablet Take 1 tablet (200 mg total) by mouth daily. 01/13/21   Loel Dubonnet, NP  ?Ascorbic Acid (VITAMIN C) 1000 MG tablet Take 1,000 mg by mouth daily.    [provider]  ?aspirin EC 81 MG tablet Take 1 tablet (81 mg total) by mouth daily. Swallow whole. 12/12/20   Shirley Friar, PA-C  ?benzonatate (TESSALON) 200 MG capsule Take 1 capsule (200 mg total) by mouth 2 (two) times daily as needed for cough. 05/12/21   Henson, Vickie L, NP-C  ?bisacodyl (DULCOLAX) 10 MG suppository Place 10 mg rectally as needed for moderate constipation.    [provider]  ?Cholecalciferol (VITAMIN D3) 10 MCG (400 UNIT) tablet Take 400 Units by mouth daily.    [provider]  ?Cinnamon 500 MG capsule Take 1,000 mg by mouth  daily.    [provider]  ?diphenhydramine-acetaminophen (TYLENOL PM) 25-500 MG TABS tablet Take 2 tablets by mouth at bedtime.    [provider]  ?docusate sodium (COLACE) 250 MG capsule Take 250 mg by mouth daily as needed for mild constipation.    [provider]  ?furosemide (LASIX) 20 MG tablet Take 1 tablet (20 mg total) by mouth as needed. ?Patient taking differently: Take 20 mg by mouth as needed for fluid or edema. 11/14/20 04/23/21  Loel Dubonnet, NP  ?ondansetron (ZOFRAN) 4 MG tablet Take 1 tablet (4 mg total) by mouth every 8 (eight) hours as needed for nausea or vomiting. 12/13/20   Biagio Borg, MD  ?oxybutynin (DITROPAN) 5 MG tablet TAKE 1 TABLET EVERY 8 HOURS AS NEEDED FOR UP TO 14 DAYS FOR BLADDER SPASMS. ?Patient taking differently: Take 5 mg by mouth 2 (two) times daily. 09/08/20   Biagio Borg, MD  ?QUEtiapine (SEROQUEL) 50 MG tablet Take 1 tablet (50 mg total) by mouth at bedtime. 11/03/20   Biagio Borg, MD  ?rosuvastatin (CRESTOR) 20 MG tablet Take 1 tablet (20 mg total) by mouth daily. 09/01/20   Loel Dubonnet, NP  ?sulfamethoxazole-trimethoprim (BACTRIM DS) 800-160 MG tablet Take 1 tablet by mouth 2 (two) times daily. 05/12/21   Henson, Vickie L, NP-C  ?tamsulosin (FLOMAX) 0.4 MG CAPS capsule Take 0.4 mg by mouth every evening. 12/01/20   [provider]  ?  traZODone (DESYREL) 50 MG tablet TAKE 1/2 TO 1 TABLET BY MOUTH AT BEDTIME AS NEEDED FOR SLEEP 12/03/20   Biagio Borg, MD  ?vitamin E 180 MG (400 UNITS) capsule Take 400 Units by mouth daily.    [provider]  ?   ? ?Allergies    ?Patient has no known allergies.   ? ?Review of Systems   ?Review of Systems ? ?Physical Exam ?Updated Vital Signs ?BP (!) 120/57   Pulse 70   Temp 98.2 ?F (36.8 ?C)   Resp 18   Ht 5\' 10"  (1.778 m)   Wt 71.7 kg   SpO2 100%   BMI 22.67 kg/m?  ?Physical Exam ?Vitals and nursing note reviewed. Exam conducted with a chaperone present.  ?Constitutional:   ?    Appearance: Normal appearance.  ?HENT:  ?   Head: Normocephalic and atraumatic.  ?Eyes:  ?   General: No scleral icterus.    ?   Right eye: No discharge.     ?   Left eye: No discharge.  ?   Extraocular Movements: Extraocular movements intact.  ?   Pupils: Pupils are equal, round, and reactive to light.  ?Cardiovascular:  ?   Rate and Rhythm: Normal rate and regular rhythm.  ?   Pulses: Normal pulses.  ?   Heart sounds: Normal heart sounds. No murmur heard. ?  No friction rub. No gallop.  ?Pulmonary:  ?   Effort: Pulmonary effort is normal. No respiratory distress.  ?   Breath sounds: Normal breath sounds.  ?Abdominal:  ?   General: Abdomen is flat. Bowel sounds are normal. There is no distension.  ?   Palpations: Abdomen is soft.  ?   Tenderness: There is no abdominal tenderness.  ?Genitourinary: ?   Comments: Foley catheter in place, no output to the bag. ?Skin: ?   General: Skin is warm and dry.  ?   Coloration: Skin is not jaundiced.  ?Neurological:  ?   Mental Status: He is alert. Mental status is at baseline.  ?   Coordination: Coordination normal.  ? ? ?ED Results / Procedures / Treatments   ?Labs ?(all labs ordered are listed, but only abnormal results are displayed) ?Labs Reviewed  ?CBC WITH DIFFERENTIAL/PLATELET - Abnormal; Notable for the following components:  ?    Result Value  ? RBC 3.22 (*)   ? Hemoglobin 10.3 (*)   ? HCT 32.5 (*)   ? MCV 100.9 (*)   ? Lymphs Abs 4.6 (*)   ? All other components within normal limits  ?BASIC METABOLIC PANEL - Abnormal; Notable for the following components:  ? CO2 21 (*)   ? Glucose, Bld 102 (*)   ? BUN 37 (*)   ? Creatinine, Ser 1.75 (*)   ? GFR, Estimated 38 (*)   ? All other components within normal limits  ?URINALYSIS, ROUTINE W REFLEX MICROSCOPIC - Abnormal; Notable for the following components:  ? APPearance HAZY (*)   ? Hgb urine dipstick LARGE (*)   ? Protein, ur TRACE (*)   ? Leukocytes,Ua MODERATE (*)   ? RBC / HPF >50 (*)   ? WBC, UA >50 (*)   ? Bacteria, UA  RARE (*)   ? All other components within normal limits  ?URINE CULTURE  ? ? ?EKG ?None ? ?Radiology ?US Renal ? ?Result Date: 05/23/2021 ?CLINICAL DATA:  Urinary retention EXAM: RENAL / URINARY TRACT ULTRASOUND COMPLETE COMPARISON:  01/25/2021 FINDINGS: Right Kidney: Renal measurements:  9.8 x 5.9 x 4.9 cm. = volume: 148 mL. Cysts are seen within the right kidney measuring up to 2.8 cm. These appears similar to that noted on the prior exam. No mass or hydronephrosis is noted. Left Kidney: Renal measurements: 10.7 x 5.3 x 4.8 cm. = volume: 141 mL. No mass lesion or hydronephrosis is noted. Cysts are seen similar to that noted on the prior exam measuring up to 2.4 cm. Bladder: Decompressed by Foley catheter. Other: None. IMPRESSION: Bilateral simple renal cysts stable from prior exam. No evidence of hydronephrosis. Electronically Signed   By: Inez Catalina M.D.   On: 05/23/2021 23:32   ? ?Procedures ?Procedures  ? ? ?Medications Ordered in ED ?Medications - No data to display ? ?ED Course/ Medical Decision Making/ A&P ?  ?                        ?Medical Decision Making ?Amount and/or Complexity of Data Reviewed ?Labs: ordered. ?Radiology: ordered. ? ? ?This patient presents to the ED for concern of foley catheter concern, this involves an extensive number of treatment options, and is a complaint that carries with it a high risk of complications and morbidity.  The differential diagnosis includes obstruction, clogged catheter, urinary retention, dehydration, AKI, anemia from blood loss ? ? ?Additional history obtained:  ? ?Independent historian: wife (of 55 years) ? ?  ?Lab Tests: ? ?I ordered, viewed, and personally interpreted labs.  The pertinent results include: No leukocytosis, stable anemia with a hemoglobin of 10.3.  Per chart review decreased from baseline but not grossly so.  Patient does not have any gross electrolyte derangement, slight AKI with a creatinine of 1.75.  This is slightly increased from  baseline. ? ?Hemoglobin  ?Date Value Ref Range Status  ?05/23/2021 10.3 (L) 13.0 - 17.0 g/dL Final  ?04/11/2021 12.1 (L) 13.0 - 17.7 g/dL Final  ?12/06/2020 11.5 (L) 13.0 - 17.0 g/dL Final  ?11/03/2020 11.3 (L) 13.0 - 1

## 2021-05-25 DIAGNOSIS — R338 Other retention of urine: Secondary | ICD-10-CM | POA: Diagnosis not present

## 2021-05-25 LAB — URINE CULTURE: Culture: NO GROWTH

## 2021-05-29 DIAGNOSIS — K5730 Diverticulosis of large intestine without perforation or abscess without bleeding: Secondary | ICD-10-CM | POA: Diagnosis not present

## 2021-05-29 DIAGNOSIS — R310 Gross hematuria: Secondary | ICD-10-CM | POA: Diagnosis not present

## 2021-05-29 DIAGNOSIS — K4090 Unilateral inguinal hernia, without obstruction or gangrene, not specified as recurrent: Secondary | ICD-10-CM | POA: Diagnosis not present

## 2021-05-29 DIAGNOSIS — N281 Cyst of kidney, acquired: Secondary | ICD-10-CM | POA: Diagnosis not present

## 2021-05-29 DIAGNOSIS — N210 Calculus in bladder: Secondary | ICD-10-CM | POA: Diagnosis not present

## 2021-05-29 DIAGNOSIS — N3289 Other specified disorders of bladder: Secondary | ICD-10-CM | POA: Diagnosis not present

## 2021-05-30 DIAGNOSIS — R338 Other retention of urine: Secondary | ICD-10-CM | POA: Diagnosis not present

## 2021-05-30 DIAGNOSIS — R310 Gross hematuria: Secondary | ICD-10-CM | POA: Diagnosis not present

## 2021-05-31 ENCOUNTER — Ambulatory Visit (INDEPENDENT_AMBULATORY_CARE_PROVIDER_SITE_OTHER): Payer: Medicare HMO | Admitting: Internal Medicine

## 2021-05-31 ENCOUNTER — Encounter: Payer: Self-pay | Admitting: Internal Medicine

## 2021-05-31 ENCOUNTER — Inpatient Hospital Stay: Payer: Medicare HMO | Admitting: Internal Medicine

## 2021-05-31 VITALS — BP 122/64 | HR 69 | Temp 98.0°F | Ht 70.0 in | Wt 153.0 lb

## 2021-05-31 DIAGNOSIS — Z0001 Encounter for general adult medical examination with abnormal findings: Secondary | ICD-10-CM

## 2021-05-31 DIAGNOSIS — I10 Essential (primary) hypertension: Secondary | ICD-10-CM | POA: Diagnosis not present

## 2021-05-31 DIAGNOSIS — E538 Deficiency of other specified B group vitamins: Secondary | ICD-10-CM

## 2021-05-31 DIAGNOSIS — E119 Type 2 diabetes mellitus without complications: Secondary | ICD-10-CM | POA: Diagnosis not present

## 2021-05-31 DIAGNOSIS — N1831 Chronic kidney disease, stage 3a: Secondary | ICD-10-CM | POA: Diagnosis not present

## 2021-05-31 DIAGNOSIS — E1122 Type 2 diabetes mellitus with diabetic chronic kidney disease: Secondary | ICD-10-CM | POA: Diagnosis not present

## 2021-05-31 DIAGNOSIS — F03918 Unspecified dementia, unspecified severity, with other behavioral disturbance: Secondary | ICD-10-CM

## 2021-05-31 DIAGNOSIS — E559 Vitamin D deficiency, unspecified: Secondary | ICD-10-CM

## 2021-05-31 DIAGNOSIS — E7800 Pure hypercholesterolemia, unspecified: Secondary | ICD-10-CM | POA: Diagnosis not present

## 2021-05-31 NOTE — Assessment & Plan Note (Signed)
Lab Results  Component Value Date   HGBA1C 6.1 11/03/2020   Stable, pt to continue current medical treatment  - diet

## 2021-05-31 NOTE — Assessment & Plan Note (Signed)
Lab Results  Component Value Date   CREATININE 1.75 (H) 05/23/2021   Stable overall, cont to avoid nephrotoxins

## 2021-05-31 NOTE — Assessment & Plan Note (Signed)
Here with wife. Overall stable, cont same tx seroquel

## 2021-05-31 NOTE — Patient Instructions (Signed)

## 2021-05-31 NOTE — Assessment & Plan Note (Signed)
Last vitamin D Lab Results  Component Value Date   VD25OH 50.48 11/03/2020   Stable, cont oral replacement

## 2021-05-31 NOTE — Assessment & Plan Note (Signed)
Age and sex appropriate education and counseling updated with regular exercise and diet Referrals for preventative services - declines optho referral Immunizations addressed - declines covid bosoter, shingrix, pneumovax Smoking counseling  - none needed Evidence for depression or other mood disorder - none significant Most recent labs reviewed. I have personally reviewed and have noted: 1) the patient's medical and social history 2) The patient's current medications and supplements 3) The patient's height, weight, and BMI have been recorded in the chart

## 2021-05-31 NOTE — Progress Notes (Signed)
Patient ID: Philip Kerr, male   DOB: 22-May-1933, 88 y.o.   MRN: [id number]         Chief Complaint:: wellness exam and hospital follow up  Seen in ED with catheter change       HPI:  Philip Kerr is a 88 y.o. male here for wellness exam; decliens optho referral, covid booster, shingrix, pneumovax o/w up to date                        Also now taking Vit D.  Denies urinary symptoms such as dysuria, frequency, urgency, flank pain, hematuria or n/v, fever, chills.  Pt denies chest pain, increased sob or doe, wheezing, orthopnea, PND, increased LE swelling, palpitations, dizziness or syncope.   Pt denies polydipsia, polyuria, or new focal neuro s/s.    Pt denies fever, wt loss, night sweats, loss of appetite, or other constitutional symptoms  Dementia overall stable symptomatically, and not assoc with behavioral changes such as hallucinations, paranoia, or agitation.   Wt Readings from Last 3 Encounters:  05/31/21 153 lb (69.4 kg)  05/23/21 158 lb (71.7 kg)  05/12/21 155 lb (70.3 kg)   BP Readings from Last 3 Encounters:  05/31/21 122/64  05/23/21 110/68  05/12/21 130/60   Immunization History  Administered Date(s) Administered   Fluad Quad(high Dose 65+) 11/03/2020   Influenza, High Dose Seasonal PF 09/06/2017, 11/19/2018   Influenza-Unspecified 10/08/2016   PFIZER(Purple Top)SARS-COV-2 Vaccination 08/16/2019   Pneumococcal Conjugate-13 07/17/2019   Tdap 05/22/2019   Health Maintenance Due  Topic Date Due   HEMOGLOBIN A1C  05/04/2021      Past Medical History:  Diagnosis Date   Aortic insufficiency    Coronary artery disease    coronary artery bypass graft x 5 in 1996   Diabetes mellitus    Hypercholesterolemia    non-insulin dependent   Hypertension    Ischemic cardiomyopathy    Mitral regurgitation    PVC (premature ventricular contraction)    Sinus pause 12/07/2020   s/p PPM   Past Surgical History:  Procedure Laterality Date   CORONARY ARTERY BYPASS GRAFT      LUNG SURGERY     OTHER SURGICAL HISTORY     percutaneous coronary intervention of the  posterolateral segment on 01/27/1998   PACEMAKER IMPLANT N/A 12/07/2020   Procedure: PACEMAKER IMPLANT;  Surgeon: Constance Haw, MD;  Location: [facility];  Service: Cardiovascular;  Laterality: N/A;    reports that he has never smoked. He has never used smokeless tobacco. He reports that he does not drink alcohol and does not use drugs. family history is not on file. He was adopted. No Known Allergies Current Outpatient Medications on File Prior to Visit  Medication Sig Dispense Refill   acetaminophen (TYLENOL) 325 MG tablet Take 650 mg by mouth every 6 (six) hours as needed for moderate pain or headache.     amiodarone (PACERONE) 200 MG tablet Take 1 tablet (200 mg total) by mouth daily. 90 tablet 3   Ascorbic Acid (VITAMIN C) 1000 MG tablet Take 1,000 mg by mouth daily.     aspirin EC 81 MG tablet Take 1 tablet (81 mg total) by mouth daily. Swallow whole. 30 tablet 11   benzonatate (TESSALON) 200 MG capsule Take 1 capsule (200 mg total) by mouth 2 (two) times daily as needed for cough. 20 capsule 0   bisacodyl (DULCOLAX) 10 MG suppository Place 10 mg rectally as needed  for moderate constipation.     Cholecalciferol (VITAMIN D3) 10 MCG (400 UNIT) tablet Take 400 Units by mouth daily.     Cinnamon 500 MG capsule Take 1,000 mg by mouth daily.     diphenhydramine-acetaminophen (TYLENOL PM) 25-500 MG TABS tablet Take 2 tablets by mouth at bedtime.     docusate sodium (COLACE) 250 MG capsule Take 250 mg by mouth daily as needed for mild constipation.     ondansetron (ZOFRAN) 4 MG tablet Take 1 tablet (4 mg total) by mouth every 8 (eight) hours as needed for nausea or vomiting. 20 tablet 0   oxybutynin (DITROPAN) 5 MG tablet TAKE 1 TABLET EVERY 8 HOURS AS NEEDED FOR UP TO 14 DAYS FOR BLADDER SPASMS. (Patient taking differently: Take 5 mg by mouth 2 (two) times daily.) 270 tablet 1   QUEtiapine  (SEROQUEL) 50 MG tablet Take 1 tablet (50 mg total) by mouth at bedtime. 90 tablet 3   rosuvastatin (CRESTOR) 20 MG tablet Take 1 tablet (20 mg total) by mouth daily. 90 tablet 3   sulfamethoxazole-trimethoprim (BACTRIM DS) 800-160 MG tablet Take 1 tablet by mouth 2 (two) times daily. 14 tablet 0   tamsulosin (FLOMAX) 0.4 MG CAPS capsule Take 0.4 mg by mouth every evening.     traZODone (DESYREL) 50 MG tablet TAKE 1/2 TO 1 TABLET BY MOUTH AT BEDTIME AS NEEDED FOR SLEEP 90 tablet 1   vitamin E 180 MG (400 UNITS) capsule Take 400 Units by mouth daily.     furosemide (LASIX) 20 MG tablet Take 1 tablet (20 mg total) by mouth as needed. (Patient taking differently: Take 20 mg by mouth as needed for fluid or edema.) 30 tablet 2   No current facility-administered medications on file prior to visit.        ROS:  All others reviewed and negative.  Objective        PE:  BP 122/64 (BP Location: Left Arm, Patient Position: Sitting, Cuff Size: Large)   Pulse 69   Temp 98 F (36.7 C) (Oral)   Ht 5\' 10"  (1.778 m)   Wt 153 lb (69.4 kg)   SpO2 97%   BMI 21.95 kg/m                 Constitutional: Pt appears in NAD               HENT: Head: NCAT.                Right Ear: External ear normal.                 Left Ear: External ear normal.                Eyes: . Pupils are equal, round, and reactive to light. Conjunctivae and EOM are normal               Nose: without d/c or deformity               Neck: Neck supple. Gross normal ROM               Cardiovascular: Normal rate and regular rhythm.                 Pulmonary/Chest: Effort normal and breath sounds without rales or wheezing.                Abd:  Soft, NT, ND, + BS, no organomegaly  Neurological: Pt is alert. At baseline orientation, motor grossly intact               Skin: Skin is warm. No rashes, no other new lesions, LE edema - none               Psychiatric: Pt behavior is normal without agitation   Micro: none  Cardiac  tracings I have personally interpreted today:  none  Pertinent Radiological findings (summarize): none   Lab Results  Component Value Date   WBC 9.1 05/23/2021   HGB 10.3 (L) 05/23/2021   HCT 32.5 (L) 05/23/2021   PLT 224 05/23/2021   GLUCOSE 102 (H) 05/23/2021   CHOL 90 11/03/2020   TRIG 92.0 11/03/2020   HDL 44.00 11/03/2020   LDLCALC 28 11/03/2020   ALT 25 04/11/2021   AST 24 04/11/2021   NA 136 05/23/2021   K 4.7 05/23/2021   CL 106 05/23/2021   CREATININE 1.75 (H) 05/23/2021   BUN 37 (H) 05/23/2021   CO2 21 (L) 05/23/2021   TSH 4.200 04/11/2021   PSA 1.97 09/06/2017   INR 1.2 12/07/2020   HGBA1C 6.1 11/03/2020   MICROALBUR 7.4 (H) 11/03/2020   Assessment/Plan:  COLDEN SAMARAS is a 88 y.o. White or Caucasian [1] male with  has a past medical history of Aortic insufficiency, Coronary artery disease, Diabetes mellitus, Hypercholesterolemia, Hypertension, Ischemic cardiomyopathy, Mitral regurgitation, PVC (premature ventricular contraction), and Sinus pause (12/07/2020).  Vitamin D deficiency Last vitamin D Lab Results  Component Value Date   VD25OH 50.48 11/03/2020   Stable, cont oral replacement   Encounter for well adult exam with abnormal findings Age and sex appropriate education and counseling updated with regular exercise and diet Referrals for preventative services - declines optho referral Immunizations addressed - declines covid bosoter, shingrix, pneumovax Smoking counseling  - none needed Evidence for depression or other mood disorder - none significant Most recent labs reviewed. I have personally reviewed and have noted: 1) the patient's medical and social history 2) The patient's current medications and supplements 3) The patient's height, weight, and BMI have been recorded in the chart   HYPERCHOLESTEROLEMIA Lab Results  Component Value Date   [place] 28 11/03/2020   Stable, pt to continue current statin crestor 20   Essential  hypertension BP Readings from Last 3 Encounters:  05/31/21 122/64  05/23/21 110/68  05/12/21 130/60   Stable, pt to continue medical treatment lasix   Diabetes mellitus type II, non insulin dependent ([place]) Lab Results  Component Value Date   HGBA1C 6.1 11/03/2020   Stable, pt to continue current medical treatment  - diet   Chronic kidney disease, stage 3a ([place]) Lab Results  Component Value Date   CREATININE 1.75 (H) 05/23/2021   Stable overall, cont to avoid nephrotoxins   Dementia with behavioral disturbance Here with wife. Overall stable, cont same tx seroquel  Followup: Return in about 6 months (around 12/01/2021).  Cathlean Cower, MD 05/31/2021 8:36 PM [facility]

## 2021-05-31 NOTE — Assessment & Plan Note (Signed)
BP Readings from Last 3 Encounters:  05/31/21 122/64  05/23/21 110/68  05/12/21 130/60   Stable, pt to continue medical treatment lasix

## 2021-05-31 NOTE — Assessment & Plan Note (Signed)
Lab Results  Component Value Date   LDLCALC 28 11/03/2020   Stable, pt to continue current statin crestor 20

## 2021-06-09 ENCOUNTER — Ambulatory Visit (INDEPENDENT_AMBULATORY_CARE_PROVIDER_SITE_OTHER): Payer: Medicare HMO

## 2021-06-09 DIAGNOSIS — I495 Sick sinus syndrome: Secondary | ICD-10-CM | POA: Diagnosis not present

## 2021-06-11 LAB — CUP PACEART REMOTE DEVICE CHECK
Battery Remaining Longevity: 174 mo
Battery Voltage: 3.19 V
Brady Statistic AP VP Percent: 0.07 %
Brady Statistic AP VS Percent: 13.36 %
Brady Statistic AS VP Percent: 0.07 %
Brady Statistic AS VS Percent: 86.51 %
Brady Statistic RA Percent Paced: 14.26 %
Brady Statistic RV Percent Paced: 0.14 %
Date Time Interrogation Session: 20230601212758
Implantable Lead Implant Date: 20221130
Implantable Lead Implant Date: 20221130
Implantable Lead Location: 753859
Implantable Lead Location: 753860
Implantable Lead Model: 5076
Implantable Lead Model: 5076
Implantable Pulse Generator Implant Date: 20221130
Lead Channel Impedance Value: 285 Ohm
Lead Channel Impedance Value: 361 Ohm
Lead Channel Impedance Value: 380 Ohm
Lead Channel Impedance Value: 570 Ohm
Lead Channel Pacing Threshold Amplitude: 0.625 V
Lead Channel Pacing Threshold Amplitude: 0.625 V
Lead Channel Pacing Threshold Pulse Width: 0.4 ms
Lead Channel Pacing Threshold Pulse Width: 0.4 ms
Lead Channel Sensing Intrinsic Amplitude: 2.125 mV
Lead Channel Sensing Intrinsic Amplitude: 2.125 mV
Lead Channel Sensing Intrinsic Amplitude: 6.25 mV
Lead Channel Sensing Intrinsic Amplitude: 6.25 mV
Lead Channel Setting Pacing Amplitude: 1.5 V
Lead Channel Setting Pacing Amplitude: 2 V
Lead Channel Setting Pacing Pulse Width: 0.4 ms
Lead Channel Setting Sensing Sensitivity: 0.9 mV

## 2021-06-16 ENCOUNTER — Other Ambulatory Visit: Payer: Self-pay | Admitting: Internal Medicine

## 2021-06-16 NOTE — Progress Notes (Signed)
Remote pacemaker transmission.   

## 2021-06-26 ENCOUNTER — Other Ambulatory Visit (INDEPENDENT_AMBULATORY_CARE_PROVIDER_SITE_OTHER): Payer: Medicare HMO

## 2021-06-26 DIAGNOSIS — E119 Type 2 diabetes mellitus without complications: Secondary | ICD-10-CM | POA: Diagnosis not present

## 2021-06-26 DIAGNOSIS — E538 Deficiency of other specified B group vitamins: Secondary | ICD-10-CM | POA: Diagnosis not present

## 2021-06-26 DIAGNOSIS — R338 Other retention of urine: Secondary | ICD-10-CM | POA: Diagnosis not present

## 2021-06-26 DIAGNOSIS — E559 Vitamin D deficiency, unspecified: Secondary | ICD-10-CM | POA: Diagnosis not present

## 2021-06-26 LAB — LIPID PANEL
Cholesterol: 84 mg/dL (ref 0–200)
HDL: 34.5 mg/dL — ABNORMAL LOW (ref >39.00)
LDL Cholesterol: 15 mg/dL (ref 0–99)
NonHDL: 49.64
Total CHOL/HDL Ratio: 2
Triglycerides: 173 mg/dL — ABNORMAL HIGH (ref 0.0–149.0)
VLDL: 34.6 mg/dL (ref 0.0–40.0)

## 2021-06-26 LAB — HEPATIC FUNCTION PANEL
ALT: 15 U/L (ref 0–53)
AST: 16 U/L (ref 0–37)
Albumin: 3.6 g/dL (ref 3.5–5.2)
Alkaline Phosphatase: 46 U/L (ref 39–117)
Bilirubin, Direct: 0.1 mg/dL (ref 0.0–0.3)
Total Bilirubin: 0.3 mg/dL (ref 0.2–1.2)
Total Protein: 6.7 g/dL (ref 6.0–8.3)

## 2021-06-26 LAB — CBC WITH DIFFERENTIAL/PLATELET
Basophils Absolute: 0.1 10*3/uL (ref 0.0–0.1)
Basophils Relative: 1 % (ref 0.0–3.0)
Eosinophils Absolute: 0.4 10*3/uL (ref 0.0–0.7)
Eosinophils Relative: 4.9 % (ref 0.0–5.0)
HCT: 31.6 % — ABNORMAL LOW (ref 39.0–52.0)
Hemoglobin: 10.6 g/dL — ABNORMAL LOW (ref 13.0–17.0)
Lymphocytes Relative: 36.5 % (ref 12.0–46.0)
Lymphs Abs: 3.2 10*3/uL (ref 0.7–4.0)
MCHC: 33.5 g/dL (ref 30.0–36.0)
MCV: 98.4 fl (ref 78.0–100.0)
Monocytes Absolute: 0.4 10*3/uL (ref 0.1–1.0)
Monocytes Relative: 4.8 % (ref 3.0–12.0)
Neutro Abs: 4.7 10*3/uL (ref 1.4–7.7)
Neutrophils Relative %: 52.8 % (ref 43.0–77.0)
Platelets: 206 10*3/uL (ref 150.0–400.0)
RBC: 3.21 Mil/uL — ABNORMAL LOW (ref 4.22–5.81)
RDW: 13.9 % (ref 11.5–15.5)
WBC: 8.8 10*3/uL (ref 4.0–10.5)

## 2021-06-26 LAB — VITAMIN D 25 HYDROXY (VIT D DEFICIENCY, FRACTURES): VITD: 45.18 ng/mL (ref 30.00–100.00)

## 2021-06-26 LAB — TSH: TSH: 5.01 u[IU]/mL (ref 0.35–5.50)

## 2021-06-26 LAB — BASIC METABOLIC PANEL
BUN: 31 mg/dL — ABNORMAL HIGH (ref 6–23)
CO2: 26 mEq/L (ref 19–32)
Calcium: 9 mg/dL (ref 8.4–10.5)
Chloride: 108 mEq/L (ref 96–112)
Creatinine, Ser: 1.72 mg/dL — ABNORMAL HIGH (ref 0.40–1.50)
GFR: 35.74 mL/min — ABNORMAL LOW (ref >60.00)
Glucose, Bld: 170 mg/dL — ABNORMAL HIGH (ref 70–99)
Potassium: 4.8 mEq/L (ref 3.5–5.1)
Sodium: 137 mEq/L (ref 135–145)

## 2021-06-26 LAB — HEMOGLOBIN A1C: Hgb A1c MFr Bld: 6.6 % — ABNORMAL HIGH (ref 4.6–6.5)

## 2021-06-26 LAB — VITAMIN B12: Vitamin B-12: 413 pg/mL (ref 211–911)

## 2021-06-27 DIAGNOSIS — R338 Other retention of urine: Secondary | ICD-10-CM | POA: Diagnosis not present

## 2021-06-27 DIAGNOSIS — N401 Enlarged prostate with lower urinary tract symptoms: Secondary | ICD-10-CM | POA: Diagnosis not present

## 2021-06-27 LAB — MICROALBUMIN / CREATININE URINE RATIO
Creatinine,U: 80.3 mg/dL
Microalb Creat Ratio: 107.9 mg/g — ABNORMAL HIGH (ref 0.0–30.0)
Microalb, Ur: 86.6 mg/dL — ABNORMAL HIGH (ref 0.0–1.9)

## 2021-06-28 LAB — URINALYSIS, ROUTINE W REFLEX MICROSCOPIC
Nitrite: POSITIVE — AB
Specific Gravity, Urine: 1.015 (ref 1.000–1.030)
Total Protein, Urine: >=300 — AB
Urine Glucose: NEGATIVE
Urobilinogen, UA: 1 (ref 0.0–1.0)
pH: 7 (ref 5.0–8.0)

## 2021-07-15 ENCOUNTER — Other Ambulatory Visit: Payer: Self-pay | Admitting: Internal Medicine

## 2021-07-16 NOTE — Telephone Encounter (Signed)
Please refill as per office routine med refill policy (all routine meds to be refilled for 3 mo or monthly (per pt preference) up to one year from last visit, then month to month grace period for 3 mo, then further med refills will have to be denied) ? ?

## 2021-07-20 ENCOUNTER — Ambulatory Visit: Payer: Medicare HMO | Admitting: Family Medicine

## 2021-07-21 ENCOUNTER — Encounter: Payer: Self-pay | Admitting: Family Medicine

## 2021-07-21 ENCOUNTER — Ambulatory Visit (INDEPENDENT_AMBULATORY_CARE_PROVIDER_SITE_OTHER): Payer: Medicare HMO | Admitting: Family Medicine

## 2021-07-21 VITALS — BP 128/60 | HR 71 | Temp 98.0°F | Ht 70.0 in | Wt 151.4 lb

## 2021-07-21 DIAGNOSIS — K089 Disorder of teeth and supporting structures, unspecified: Secondary | ICD-10-CM | POA: Diagnosis not present

## 2021-07-21 DIAGNOSIS — S025XXA Fracture of tooth (traumatic), initial encounter for closed fracture: Secondary | ICD-10-CM | POA: Diagnosis not present

## 2021-07-21 DIAGNOSIS — G8929 Other chronic pain: Secondary | ICD-10-CM | POA: Diagnosis not present

## 2021-07-21 NOTE — Progress Notes (Signed)
Subjective:     Patient ID: Philip Kerr, male    DOB: [birth year], 88 y.o.   MRN: [id number]  Chief Complaint  Patient presents with   Office Visit    Jaw pain on left side x1-2 months    HPI Patient is in today for left upper jaw pain, intermittent x 2 months. Complains of tenderness to with chewing or fluids at times.   Health Maintenance Due  Topic Date Due   OPHTHALMOLOGY EXAM  Never done   COVID-19 Vaccine (2 - Pfizer risk series) 09/06/2019    Past Medical History:  Diagnosis Date   Aortic insufficiency    Coronary artery disease    coronary artery bypass graft x 5 in 1996   Diabetes mellitus    Hypercholesterolemia    non-insulin dependent   Hypertension    Ischemic cardiomyopathy    Mitral regurgitation    PVC (premature ventricular contraction)    Sinus pause 12/07/2020   s/p PPM    Past Surgical History:  Procedure Laterality Date   CORONARY ARTERY BYPASS GRAFT     LUNG SURGERY     OTHER SURGICAL HISTORY     percutaneous coronary intervention of the  posterolateral segment on 01/27/1998   PACEMAKER IMPLANT N/A 12/07/2020   Procedure: PACEMAKER IMPLANT;  Surgeon: Constance Haw, MD;  Location: [facility];  Service: Cardiovascular;  Laterality: N/A;    Family History  Adopted: Yes  Problem Relation Age of Onset   CAD Neg Hx     Social History   Socioeconomic History   Marital status: Married    Spouse name: Not on file   Number of children: 1   Years of education: Not on file   Highest education level: Not on file  Occupational History   Occupation: Runs a Acupuncturist: SELF-EMPLOYED  Tobacco Use   Smoking status: Never   Smokeless tobacco: Never  Vaping Use   Vaping Use: Never used  Substance and Sexual Activity   Alcohol use: No   Drug use: No   Sexual activity: Not Currently  Other Topics Concern   Not on file  Social History Narrative   Not on file   Social Determinants of Health   Financial  Resource Strain: Not on file  Food Insecurity: No Food Insecurity (11/27/2016)   Hunger Vital Sign    Worried About Running Out of Food in the Last Year: Never true    Ran Out of Food in the Last Year: Never true  Transportation Needs: No Transportation Needs (11/27/2016)   PRAPARE - Hydrologist (Medical): No    Lack of Transportation (Non-Medical): No  Physical Activity: Insufficiently Active (11/27/2016)   Exercise Vital Sign    Days of Exercise per Week: 3 days    Minutes of Exercise per Session: 30 min  Stress: No Stress Concern Present (11/27/2016)   [place]    Feeling of Stress : Only a little  Social Connections: Not on file  Intimate Partner Violence: Not on file    Outpatient Medications Prior to Visit  Medication Sig Dispense Refill   acetaminophen (TYLENOL) 325 MG tablet Take 650 mg by mouth every 6 (six) hours as needed for moderate pain or headache.     amiodarone (PACERONE) 200 MG tablet Take 1 tablet (200 mg total) by mouth daily. 90 tablet 3   Ascorbic Acid (VITAMIN C)  1000 MG tablet Take 1,000 mg by mouth daily.     aspirin EC 81 MG tablet Take 1 tablet (81 mg total) by mouth daily. Swallow whole. 30 tablet 11   benzonatate (TESSALON) 200 MG capsule Take 1 capsule (200 mg total) by mouth 2 (two) times daily as needed for cough. 20 capsule 0   bisacodyl (DULCOLAX) 10 MG suppository Place 10 mg rectally as needed for moderate constipation.     Cholecalciferol (VITAMIN D3) 10 MCG (400 UNIT) tablet Take 400 Units by mouth daily.     Cinnamon 500 MG capsule Take 1,000 mg by mouth daily.     diphenhydramine-acetaminophen (TYLENOL PM) 25-500 MG TABS tablet Take 2 tablets by mouth at bedtime.     docusate sodium (COLACE) 250 MG capsule Take 250 mg by mouth daily as needed for mild constipation.     ondansetron (ZOFRAN) 4 MG tablet Take 1 tablet (4 mg total) by mouth every 8  (eight) hours as needed for nausea or vomiting. 20 tablet 0   oxybutynin (DITROPAN) 5 MG tablet TAKE 1 TABLET EVERY 8 HOURS AS NEEDED FOR UP TO 14 DAYS FOR BLADDER SPASMS. 270 tablet 2   QUEtiapine (SEROQUEL) 50 MG tablet Take 1 tablet (50 mg total) by mouth at bedtime. 90 tablet 3   rosuvastatin (CRESTOR) 20 MG tablet Take 1 tablet (20 mg total) by mouth daily. 90 tablet 3   tamsulosin (FLOMAX) 0.4 MG CAPS capsule Take 0.4 mg by mouth every evening.     traZODone (DESYREL) 50 MG tablet TAKE 1/2 TO 1 TABLET BY MOUTH AT BEDTIME AS NEEDED FOR SLEEP 90 tablet 1   vitamin E 180 MG (400 UNITS) capsule Take 400 Units by mouth daily.     furosemide (LASIX) 20 MG tablet Take 1 tablet (20 mg total) by mouth as needed. (Patient taking differently: Take 20 mg by mouth as needed for fluid or edema.) 30 tablet 2   sulfamethoxazole-trimethoprim (BACTRIM DS) 800-160 MG tablet Take 1 tablet by mouth 2 (two) times daily. 14 tablet 0   No facility-administered medications prior to visit.    No Known Allergies  ROS Pertinent positives and negatives in the history of present illness.      Objective:    Physical Exam Constitutional:      General: He is not in acute distress.    Appearance: He is not ill-appearing.  HENT:     Right Ear: Tympanic membrane and ear canal normal.     Left Ear: Tympanic membrane and ear canal normal.     Mouth/Throat:     Lips: Pink.     Dentition: Abnormal dentition. Dental caries present. No dental abscesses.     Pharynx: Oropharynx is clear.     Comments: Several broken teeth and dental caries. No abscess visualized.  Cardiovascular:     Rate and Rhythm: Normal rate.  Pulmonary:     Effort: Pulmonary effort is normal.  Musculoskeletal:     Cervical back: Normal range of motion. No tenderness.  Lymphadenopathy:     Cervical: No cervical adenopathy.  Neurological:     Mental Status: He is alert.     BP 128/60 (BP Location: Right Arm, Patient Position: Sitting,  Cuff Size: Normal)   Pulse 71   Temp 98 F (36.7 C) (Oral)   Ht 5\' 10"  (1.778 m)   Wt 151 lb 6.4 oz (68.7 kg)   SpO2 97%   BMI 21.72 kg/m  Wt Readings from Last 3 Encounters:  07/21/21 151 lb 6.4 oz (68.7 kg)  05/31/21 153 lb (69.4 kg)  05/23/21 158 lb (71.7 kg)       Assessment & Plan:   Problem List Items Addressed This Visit       Digestive   Broken teeth    Pain associated with broken teeth and sensitivity present. His wife will call and schedule a dental visit.         Other   Chronic dental pain - Primary    No sign of infection. Needs dental exam. His wife will call to schedule.        I have discontinued Pilar Plate B. Salvador's sulfamethoxazole-trimethoprim. I am also having him maintain his Vitamin D3, vitamin E, rosuvastatin, QUEtiapine, furosemide, tamsulosin, vitamin C, docusate sodium, Cinnamon, aspirin EC, ondansetron, amiodarone, diphenhydramine-acetaminophen, bisacodyl, acetaminophen, benzonatate, traZODone, and oxybutynin.  No orders of the defined types were placed in this encounter.

## 2021-07-21 NOTE — Patient Instructions (Signed)
Please call and schedule with your dentist.   Take Tylenol as needed.

## 2021-07-21 NOTE — Assessment & Plan Note (Signed)
Pain associated with broken teeth and sensitivity present. His wife will call and schedule a dental visit.

## 2021-07-21 NOTE — Assessment & Plan Note (Signed)
No sign of infection. Needs dental exam. His wife will call to schedule.

## 2021-08-02 ENCOUNTER — Ambulatory Visit (INDEPENDENT_AMBULATORY_CARE_PROVIDER_SITE_OTHER): Payer: Medicare HMO

## 2021-08-02 DIAGNOSIS — Z0000 Encounter for general adult medical examination without abnormal findings: Secondary | ICD-10-CM | POA: Diagnosis not present

## 2021-08-02 NOTE — Patient Instructions (Signed)
Philip Kerr , Thank you for taking time to come for your Medicare Wellness Visit. I appreciate your ongoing commitment to your health goals. Please review the following plan we discussed and let me know if I can assist you in the future.   Screening recommendations/referrals: Colonoscopy: Discontinued due to age Recommended yearly ophthalmology/optometry visit for glaucoma screening and checkup Recommended yearly dental visit for hygiene and checkup  Vaccinations: Influenza vaccine: 11/03/2020 Pneumococcal vaccine: 07/17/2019; need second dose Tdap vaccine: 05/22/2019; due every 10 years Shingles vaccine: never done   Covid-19: 08/16/2019  Advanced directives: DNR form on file  Conditions/risks identified: Yes; Type II Diabetes  Next appointment: Please schedule your next Medicare Wellness Visit with your Nurse Health Advisor in 1 year by calling [phone].  Preventive Care 13 Years and Older, Male Preventive care refers to lifestyle choices and visits with your health care provider that can promote health and wellness. What does preventive care include? A yearly physical exam. This is also called an annual well check. Dental exams once or twice a year. Routine eye exams. Ask your health care provider how often you should have your eyes checked. Personal lifestyle choices, including: Daily care of your teeth and gums. Regular physical activity. Eating a healthy diet. Avoiding tobacco and drug use. Limiting alcohol use. Practicing safe sex. Taking low doses of aspirin every day. Taking vitamin and mineral supplements as recommended by your health care provider. What happens during an annual well check? The services and screenings done by your health care provider during your annual well check will depend on your age, overall health, lifestyle risk factors, and family history of disease. Counseling  Your health care provider may ask you questions about your: Alcohol use. Tobacco  use. Drug use. Emotional well-being. Home and relationship well-being. Sexual activity. Eating habits. History of falls. Memory and ability to understand (cognition). Work and work Statistician. Screening  You may have the following tests or measurements: Height, weight, and BMI. Blood pressure. Lipid and cholesterol levels. These may be checked every 5 years, or more frequently if you are over 62 years old. Skin check. Lung cancer screening. You may have this screening every year starting at age 35 if you have a 30-pack-year history of smoking and currently smoke or have quit within the past 15 years. Fecal occult blood test (FOBT) of the stool. You may have this test every year starting at age 54. Flexible sigmoidoscopy or colonoscopy. You may have a sigmoidoscopy every 5 years or a colonoscopy every 10 years starting at age 61. Prostate cancer screening. Recommendations will vary depending on your family history and other risks. Hepatitis C blood test. Hepatitis B blood test. Sexually transmitted disease (STD) testing. Diabetes screening. This is done by checking your blood sugar (glucose) after you have not eaten for a while (fasting). You may have this done every 1-3 years. Abdominal aortic aneurysm (AAA) screening. You may need this if you are a current or former smoker. Osteoporosis. You may be screened starting at age 37 if you are at high risk. Talk with your health care provider about your test results, treatment options, and if necessary, the need for more tests. Vaccines  Your health care provider may recommend certain vaccines, such as: Influenza vaccine. This is recommended every year. Tetanus, diphtheria, and acellular pertussis (Tdap, Td) vaccine. You may need a Td booster every 10 years. Zoster vaccine. You may need this after age 78. Pneumococcal 13-valent conjugate (PCV13) vaccine. One dose is recommended after age  65. Pneumococcal polysaccharide (PPSV23) vaccine.  One dose is recommended after age 46. Talk to your health care provider about which screenings and vaccines you need and how often you need them. This information is not intended to replace advice given to you by your health care provider. Make sure you discuss any questions you have with your health care provider. Document Released: 01/21/2015 Document Revised: 09/14/2015 Document Reviewed: 10/26/2014 Elsevier Interactive Patient Education  2017 [place] Prevention in the Home Falls can cause injuries. They can happen to people of all ages. There are many things you can do to make your home safe and to help prevent falls. What can I do on the outside of my home? Regularly fix the edges of walkways and driveways and fix any cracks. Remove anything that might make you trip as you walk through a door, such as a raised step or threshold. Trim any bushes or trees on the path to your home. Use bright outdoor lighting. Clear any walking paths of anything that might make someone trip, such as rocks or tools. Regularly check to see if handrails are loose or broken. Make sure that both sides of any steps have handrails. Any raised decks and porches should have guardrails on the edges. Have any leaves, snow, or ice cleared regularly. Use sand or salt on walking paths during winter. Clean up any spills in your garage right away. This includes oil or grease spills. What can I do in the bathroom? Use night lights. Install grab bars by the toilet and in the tub and shower. Do not use towel bars as grab bars. Use non-skid mats or decals in the tub or shower. If you need to sit down in the shower, use a plastic, non-slip stool. Keep the floor dry. Clean up any water that spills on the floor as soon as it happens. Remove soap buildup in the tub or shower regularly. Attach bath mats securely with double-sided non-slip rug tape. Do not have throw rugs and other things on the floor that can make  you trip. What can I do in the bedroom? Use night lights. Make sure that you have a light by your bed that is easy to reach. Do not use any sheets or blankets that are too big for your bed. They should not hang down onto the floor. Have a firm chair that has side arms. You can use this for support while you get dressed. Do not have throw rugs and other things on the floor that can make you trip. What can I do in the kitchen? Clean up any spills right away. Avoid walking on wet floors. Keep items that you use a lot in easy-to-reach places. If you need to reach something above you, use a strong step stool that has a grab bar. Keep electrical cords out of the way. Do not use floor polish or wax that makes floors slippery. If you must use wax, use non-skid floor wax. Do not have throw rugs and other things on the floor that can make you trip. What can I do with my stairs? Do not leave any items on the stairs. Make sure that there are handrails on both sides of the stairs and use them. Fix handrails that are broken or loose. Make sure that handrails are as long as the stairways. Check any carpeting to make sure that it is firmly attached to the stairs. Fix any carpet that is loose or worn. Avoid having throw rugs at  the top or bottom of the stairs. If you do have throw rugs, attach them to the floor with carpet tape. Make sure that you have a light switch at the top of the stairs and the bottom of the stairs. If you do not have them, ask someone to add them for you. What else can I do to help prevent falls? Wear shoes that: Do not have high heels. Have rubber bottoms. Are comfortable and fit you well. Are closed at the toe. Do not wear sandals. If you use a stepladder: Make sure that it is fully opened. Do not climb a closed stepladder. Make sure that both sides of the stepladder are locked into place. Ask someone to hold it for you, if possible. Clearly mark and make sure that you can  see: Any grab bars or handrails. First and last steps. Where the edge of each step is. Use tools that help you move around (mobility aids) if they are needed. These include: Canes. Walkers. Scooters. Crutches. Turn on the lights when you go into a dark area. Replace any light bulbs as soon as they burn out. Set up your furniture so you have a clear path. Avoid moving your furniture around. If any of your floors are uneven, fix them. If there are any pets around you, be aware of where they are. Review your medicines with your doctor. Some medicines can make you feel dizzy. This can increase your chance of falling. Ask your doctor what other things that you can do to help prevent falls. This information is not intended to replace advice given to you by your health care provider. Make sure you discuss any questions you have with your health care provider. Document Released: 10/21/2008 Document Revised: 06/02/2015 Document Reviewed: 01/29/2014 Elsevier Interactive Patient Education  2017 [employer].

## 2021-08-02 NOTE — Progress Notes (Signed)
I connected with Philip Kerr today by telephone and verified that I am speaking with the correct person using two identifiers. Location patient: home Location provider: work Persons participating in the virtual visit: patient, provider.   I discussed the limitations, risks, security and privacy concerns of performing an evaluation and management service by telephone and the availability of in person appointments. I also discussed with the patient that there may be a patient responsible charge related to this service. The patient expressed understanding and verbally consented to this telephonic visit.    Interactive audio and video telecommunications were attempted between this provider and patient, however failed, due to patient having technical difficulties OR patient did not have access to video capability.  We continued and completed visit with audio only.  Some vital signs may be absent or patient reported.   Time Spent with patient on telephone encounter: 30 minutes  Subjective:   Philip Kerr is a 88 y.o. male who presents for Medicare Annual/Subsequent preventive examination.  Review of Systems     Cardiac Risk Factors include: advanced age (>26men, >64 women);diabetes mellitus;dyslipidemia;hypertension;male gender     Objective:    There were no vitals filed for this visit. There is no height or weight on file to calculate BMI.     08/02/2021   10:57 AM 05/23/2021    5:24 PM 08/02/2020   11:31 AM 07/22/2020    6:06 AM 11/27/2016   10:29 AM  Advanced Directives  Does Patient Have a Medical Advance Directive? Yes No No No No  Type of Advance Directive Out of facility DNR (pink MOST or yellow form)      Would patient like information on creating a medical advance directive?   No - Patient declined  No - Patient declined    Current Medications (verified) Outpatient Encounter Medications as of 08/02/2021  Medication Sig   acetaminophen (TYLENOL) 325 MG tablet Take 650  mg by mouth every 6 (six) hours as needed for moderate pain or headache.   amiodarone (PACERONE) 200 MG tablet Take 1 tablet (200 mg total) by mouth daily.   Ascorbic Acid (VITAMIN C) 1000 MG tablet Take 1,000 mg by mouth daily.   aspirin EC 81 MG tablet Take 1 tablet (81 mg total) by mouth daily. Swallow whole.   benzonatate (TESSALON) 200 MG capsule Take 1 capsule (200 mg total) by mouth 2 (two) times daily as needed for cough.   bisacodyl (DULCOLAX) 10 MG suppository Place 10 mg rectally as needed for moderate constipation.   Cholecalciferol (VITAMIN D3) 10 MCG (400 UNIT) tablet Take 400 Units by mouth daily.   Cinnamon 500 MG capsule Take 1,000 mg by mouth daily.   diphenhydramine-acetaminophen (TYLENOL PM) 25-500 MG TABS tablet Take 2 tablets by mouth at bedtime.   docusate sodium (COLACE) 250 MG capsule Take 250 mg by mouth daily as needed for mild constipation.   furosemide (LASIX) 20 MG tablet Take 1 tablet (20 mg total) by mouth as needed. (Patient taking differently: Take 20 mg by mouth as needed for fluid or edema.)   ondansetron (ZOFRAN) 4 MG tablet Take 1 tablet (4 mg total) by mouth every 8 (eight) hours as needed for nausea or vomiting.   oxybutynin (DITROPAN) 5 MG tablet TAKE 1 TABLET EVERY 8 HOURS AS NEEDED FOR UP TO 14 DAYS FOR BLADDER SPASMS.   QUEtiapine (SEROQUEL) 50 MG tablet Take 1 tablet (50 mg total) by mouth at bedtime.   rosuvastatin (CRESTOR) 20 MG tablet Take 1  tablet (20 mg total) by mouth daily.   tamsulosin (FLOMAX) 0.4 MG CAPS capsule Take 0.4 mg by mouth every evening.   traZODone (DESYREL) 50 MG tablet TAKE 1/2 TO 1 TABLET BY MOUTH AT BEDTIME AS NEEDED FOR SLEEP   vitamin E 180 MG (400 UNITS) capsule Take 400 Units by mouth daily.   No facility-administered encounter medications on file as of 08/02/2021.    Allergies (verified) Patient has no known allergies.   History: Past Medical History:  Diagnosis Date   Aortic insufficiency    Coronary artery  disease    coronary artery bypass graft x 5 in 1996   Diabetes mellitus    Hypercholesterolemia    non-insulin dependent   Hypertension    Ischemic cardiomyopathy    Mitral regurgitation    PVC (premature ventricular contraction)    Sinus pause 12/07/2020   s/p PPM   Past Surgical History:  Procedure Laterality Date   CORONARY ARTERY BYPASS GRAFT     LUNG SURGERY     OTHER SURGICAL HISTORY     percutaneous coronary intervention of the  posterolateral segment on 01/27/1998   PACEMAKER IMPLANT N/A 12/07/2020   Procedure: PACEMAKER IMPLANT;  Surgeon: Constance Haw, MD;  Location: [facility];  Service: Cardiovascular;  Laterality: N/A;   Family History  Adopted: Yes  Problem Relation Age of Onset   CAD Neg Hx    Social History   Socioeconomic History   Marital status: Married    Spouse name: Not on file   Number of children: 1   Years of education: Not on file   Highest education level: Not on file  Occupational History   Occupation: Runs a Acupuncturist: SELF-EMPLOYED  Tobacco Use   Smoking status: Never   Smokeless tobacco: Never  Vaping Use   Vaping Use: Never used  Substance and Sexual Activity   Alcohol use: No   Drug use: No   Sexual activity: Not Currently  Other Topics Concern   Not on file  Social History Narrative   Not on file   Social Determinants of Health   Financial Resource Strain: Low Risk  (08/02/2021)   Overall Financial Resource Strain (CARDIA)    Difficulty of Paying Living Expenses: Not hard at all  Food Insecurity: No Food Insecurity (08/02/2021)   Hunger Vital Sign    Worried About Running Out of Food in the Last Year: Never true    [place] in the Last Year: Never true  Transportation Needs: No Transportation Needs (08/02/2021)   PRAPARE - Hydrologist (Medical): No    Lack of Transportation (Non-Medical): No  Physical Activity: Sufficiently Active (08/02/2021)   Exercise  Vital Sign    Days of Exercise per Week: 5 days    Minutes of Exercise per Session: 30 min  Stress: No Stress Concern Present (08/02/2021)   [place]    Feeling of Stress : Not at all  Social Connections: [place] (08/02/2021)   Social Connection and Isolation Panel [NHANES]    Frequency of Communication with Friends and Family: More than three times a week    Frequency of Social Gatherings with Friends and Family: More than three times a week    Attends Religious Services: More than 4 times per year    Active Member of [employer] or Organizations: Yes    Attends Archivist Meetings:  More than 4 times per year    Marital Status: Married    Tobacco Counseling Counseling given: Not Answered   Clinical Intake:  Pre-visit preparation completed: Yes  Pain : No/denies pain     BMI - recorded: 21.72 (07/21/2021) Nutritional Status: BMI of 19-24  Normal Nutritional Risks: None Diabetes: No  How often do you need to have someone help you when you read instructions, pamphlets, or other written materials from your doctor or pharmacy?: 1 - Never What is the last grade level you completed in school?: HSG  Diabetic? yes  Interpreter Needed?: No  Information entered by :: Lisette Abu, LPN.   Activities of Daily Living    08/02/2021   10:58 AM  In your present state of health, do you have any difficulty performing the following activities:  Hearing? 1  Vision? 0  Difficulty concentrating or making decisions? 1  Walking or climbing stairs? 0  Dressing or bathing? 0  Doing errands, shopping? 0  Preparing Food and eating ? N  Using the Toilet? N  In the past six months, have you accidently leaked urine? N  Do you have problems with loss of bowel control? N  Managing your Medications? N  Managing your Finances? N  Housekeeping or managing your Housekeeping? N    Patient Care Team: Biagio Borg, MD as PCP - General (Internal Medicine) Burnell Blanks, MD as PCP - Cardiology (Cardiology) Burnell Blanks, MD as Consulting Physician (Cardiology)  Indicate any recent Medical Services you may have received from other than [facility] providers in the past year (date may be approximate).     Assessment:   This is a routine wellness examination for Philip Kerr.  Hearing/Vision screen Hearing Screening - Comments:: Patient has hearing difficulty. No hearing aids. Vision Screening - Comments:: Patient does not wear any corrective lenses/contacts.   Patient stated he has never had an eye exam.  Dietary issues and exercise activities discussed: Current Exercise Habits: Home exercise routine, Type of exercise: walking;Other - see comments (gardening, yard work), Time (Minutes): 30, Frequency (Times/Week): 5, Weekly Exercise (Minutes/Week): 150, Intensity: Mild, Exercise limited by: Other - see comments;cardiac condition(s) (Restless Leg Syndrome)   Goals Addressed             This Visit's Progress    Continue to maintain my health.        Depression Screen    08/02/2021   10:56 AM 05/12/2021    3:53 PM 08/29/2020    4:16 PM 08/12/2020    3:19 PM 07/17/2019    9:52 AM 09/06/2017    9:52 AM 11/27/2016   10:32 AM  PHQ 2/9 Scores  PHQ - 2 Score 0 0  0 0 0 0  PHQ- 9 Score       2  Exception Documentation   Patient refusal        Fall Risk    08/02/2021   10:51 AM 05/12/2021    3:53 PM 08/12/2020    3:19 PM 07/17/2019    9:52 AM 09/06/2017    9:52 AM  Fall Risk   Falls in the past year? 0 1 0 0 No  Number falls in past yr: 0 1 0    Injury with Fall? 0 0 0    Risk for fall due to : No Fall Risks      Follow up Falls evaluation completed        FALL RISK PREVENTION PERTAINING TO THE HOME:  Any  stairs in or around the home? No  If so, are there any without handrails? No  Home free of loose throw rugs in walkways, pet beds, electrical cords, etc? Yes  Adequate lighting  in your home to reduce risk of falls? Yes   ASSISTIVE DEVICES UTILIZED TO PREVENT FALLS:  Life alert? No  Use of a cane, walker or w/c? No  Grab bars in the bathroom? Yes  Shower chair or bench in shower? Yes  Elevated toilet seat or a handicapped toilet? No   TIMED UP AND GO:  Was the test performed? No .  Length of time to ambulate 10 feet: n/a sec.   Appearance of gait: Gait not evaluated during this visit.  Cognitive Function:    08/02/2021   10:59 AM 11/27/2016   10:32 AM  MMSE - Mini Mental State Exam  Not completed: Unable to complete   Orientation to time  5  Orientation to Place  5  Registration  3  Attention/ Calculation  5  Recall  0  Language- name 2 objects  2  Language- repeat  1  Language- follow 3 step command  3  Language- read & follow direction  1  Write a sentence  1  Copy design  1  Total score  27        Immunizations Immunization History  Administered Date(s) Administered   Fluad Quad(high Dose 65+) 11/03/2020   Influenza, High Dose Seasonal PF 09/06/2017, 11/19/2018   Influenza-Unspecified 10/08/2016   PFIZER(Purple Top)SARS-COV-2 Vaccination 08/16/2019   Pneumococcal Conjugate-13 07/17/2019   Tdap 05/22/2019    TDAP status: Up to date  Flu Vaccine status: Up to date  Pneumococcal vaccine status: Due, Education has been provided regarding the importance of this vaccine. Advised may receive this vaccine at local pharmacy or Health Dept. Aware to provide a copy of the vaccination record if obtained from local pharmacy or Health Dept. Verbalized acceptance and understanding.  Covid-19 vaccine status: Declined, Education has been provided regarding the importance of this vaccine but patient still declined. Advised may receive this vaccine at local pharmacy or Health Dept.or vaccine clinic. Aware to provide a copy of the vaccination record if obtained from local pharmacy or Health Dept. Verbalized acceptance and understanding.  Qualifies  for Shingles Vaccine? Yes   Zostavax completed No   Shingrix Completed?: No.    Education has been provided regarding the importance of this vaccine. Patient has been advised to call insurance company to determine out of pocket expense if they have not yet received this vaccine. Advised may also receive vaccine at local pharmacy or Health Dept. Verbalized acceptance and understanding.  Screening Tests Health Maintenance  Topic Date Due   OPHTHALMOLOGY EXAM  Never done   COVID-19 Vaccine (2 - Pfizer risk series) 09/06/2019   Zoster Vaccines- Shingrix (1 of 2) 08/12/2021 (Originally 09/28/1952)   Pneumonia Vaccine 66+ Years old (2 - PPSV23 or PCV20) 11/03/2021 (Originally 07/16/2020)   INFLUENZA VACCINE  08/08/2021   FOOT EXAM  11/03/2021   HEMOGLOBIN A1C  12/26/2021   Diabetic kidney evaluation - GFR measurement  06/27/2022   Diabetic kidney evaluation - Urine ACR  06/27/2022   TETANUS/TDAP  05/21/2029   HPV VACCINES  Aged Out    Health Maintenance  Health Maintenance Due  Topic Date Due   OPHTHALMOLOGY EXAM  Never done   COVID-19 Vaccine (2 - Pfizer risk series) 09/06/2019    Colorectal cancer screening: No longer required.   Lung Cancer Screening: (Low  Dose CT Chest recommended if Age 9-80 years, 30 pack-year currently smoking OR have quit w/in 15years.) does not qualify.   Lung Cancer Screening Referral: no  Additional Screening:  Hepatitis C Screening: does not qualify; Completed no  Vision Screening: Recommended annual ophthalmology exams for early detection of glaucoma and other disorders of the eye. Is the patient up to date with their annual eye exam?  No  Who is the provider or what is the name of the office in which the patient attends annual eye exams? Patient Refused If pt is not established with a provider, would they like to be referred to a provider to establish care? No .   Dental Screening: Recommended annual dental exams for proper oral hygiene  Community  Resource Referral / Chronic Care Management: CRR required this visit?  No   CCM required this visit?  No      Plan:     I have personally reviewed and noted the following in the patient's chart:   Medical and social history Use of alcohol, tobacco or illicit drugs  Current medications and supplements including opioid prescriptions. Patient is not currently taking opioid prescriptions. Functional ability and status Nutritional status Physical activity Advanced directives List of other physicians Hospitalizations, surgeries, and ER visits in previous 12 months Vitals Screenings to include cognitive, depression, and falls Referrals and appointments  In addition, I have reviewed and discussed with patient certain preventive protocols, quality metrics, and best practice recommendations. A written personalized care plan for preventive services as well as general preventive health recommendations were provided to patient.     Sheral Flow, LPN   [id number]   Nurse Notes:  Patient has current diagnosis of cognitive impairment.   There were no vitals filed for this visit. There is no height or weight on file to calculate BMI.

## 2021-08-17 DIAGNOSIS — R338 Other retention of urine: Secondary | ICD-10-CM | POA: Diagnosis not present

## 2021-09-01 ENCOUNTER — Ambulatory Visit: Payer: Medicare HMO | Admitting: Cardiovascular Disease

## 2021-09-03 ENCOUNTER — Other Ambulatory Visit: Payer: Self-pay | Admitting: Internal Medicine

## 2021-09-03 NOTE — Telephone Encounter (Signed)
Please refill as per office routine med refill policy (all routine meds to be refilled for 3 mo or monthly (per pt preference) up to one year from last visit, then month to month grace period for 3 mo, then further med refills will have to be denied) ? ?

## 2021-09-08 ENCOUNTER — Ambulatory Visit (INDEPENDENT_AMBULATORY_CARE_PROVIDER_SITE_OTHER): Payer: Medicare HMO

## 2021-09-08 DIAGNOSIS — I495 Sick sinus syndrome: Secondary | ICD-10-CM

## 2021-09-10 ENCOUNTER — Other Ambulatory Visit (HOSPITAL_BASED_OUTPATIENT_CLINIC_OR_DEPARTMENT_OTHER): Payer: Self-pay | Admitting: Family

## 2021-09-10 DIAGNOSIS — R000 Tachycardia, unspecified: Secondary | ICD-10-CM

## 2021-09-10 DIAGNOSIS — Z79899 Other long term (current) drug therapy: Secondary | ICD-10-CM

## 2021-09-10 DIAGNOSIS — I4729 Other ventricular tachycardia: Secondary | ICD-10-CM

## 2021-09-11 LAB — CUP PACEART REMOTE DEVICE CHECK
Battery Remaining Longevity: 169 mo
Battery Voltage: 3.16 V
Brady Statistic AP VP Percent: 0.13 %
Brady Statistic AP VS Percent: 42.81 %
Brady Statistic AS VP Percent: 0.03 %
Brady Statistic AS VS Percent: 57.03 %
Brady Statistic RA Percent Paced: 44.45 %
Brady Statistic RV Percent Paced: 0.16 %
Date Time Interrogation Session: 20230831193736
Implantable Lead Implant Date: 20221130
Implantable Lead Implant Date: 20221130
Implantable Lead Location: 753859
Implantable Lead Location: 753860
Implantable Lead Model: 5076
Implantable Lead Model: 5076
Implantable Pulse Generator Implant Date: 20221130
Lead Channel Impedance Value: 266 Ohm
Lead Channel Impedance Value: 342 Ohm
Lead Channel Impedance Value: 361 Ohm
Lead Channel Impedance Value: 551 Ohm
Lead Channel Pacing Threshold Amplitude: 0.5 V
Lead Channel Pacing Threshold Amplitude: 0.625 V
Lead Channel Pacing Threshold Pulse Width: 0.4 ms
Lead Channel Pacing Threshold Pulse Width: 0.4 ms
Lead Channel Sensing Intrinsic Amplitude: 2 mV
Lead Channel Sensing Intrinsic Amplitude: 2 mV
Lead Channel Sensing Intrinsic Amplitude: 6.5 mV
Lead Channel Sensing Intrinsic Amplitude: 6.5 mV
Lead Channel Setting Pacing Amplitude: 1.5 V
Lead Channel Setting Pacing Amplitude: 2 V
Lead Channel Setting Pacing Pulse Width: 0.4 ms
Lead Channel Setting Sensing Sensitivity: 0.9 mV

## 2021-09-13 NOTE — Telephone Encounter (Signed)
Rx(s) sent to pharmacy electronically.  

## 2021-09-26 DIAGNOSIS — N401 Enlarged prostate with lower urinary tract symptoms: Secondary | ICD-10-CM | POA: Diagnosis not present

## 2021-09-26 DIAGNOSIS — R338 Other retention of urine: Secondary | ICD-10-CM | POA: Diagnosis not present

## 2021-09-26 DIAGNOSIS — N210 Calculus in bladder: Secondary | ICD-10-CM | POA: Diagnosis not present

## 2021-09-27 ENCOUNTER — Encounter: Payer: Self-pay | Admitting: Cardiovascular Disease

## 2021-09-27 ENCOUNTER — Ambulatory Visit: Payer: Medicare HMO | Attending: Cardiovascular Disease | Admitting: Cardiovascular Disease

## 2021-09-27 VITALS — BP 116/40 | HR 76 | Ht 70.0 in | Wt 155.0 lb

## 2021-09-27 DIAGNOSIS — I495 Sick sinus syndrome: Secondary | ICD-10-CM

## 2021-09-27 DIAGNOSIS — I25118 Atherosclerotic heart disease of native coronary artery with other forms of angina pectoris: Secondary | ICD-10-CM | POA: Diagnosis not present

## 2021-09-27 DIAGNOSIS — I10 Essential (primary) hypertension: Secondary | ICD-10-CM | POA: Diagnosis not present

## 2021-09-27 DIAGNOSIS — I255 Ischemic cardiomyopathy: Secondary | ICD-10-CM

## 2021-09-27 DIAGNOSIS — I5022 Chronic systolic (congestive) heart failure: Secondary | ICD-10-CM | POA: Diagnosis not present

## 2021-09-27 NOTE — Progress Notes (Signed)
Chief Complaint  Patient presents with   Follow-up    CAD   History of Present Illness: 88 yo male with history of CAD s/p 5V CABG 1996, ischemic cardiomyopathy, sinus node dysfunction s/p pacemaker, LBBB, HTN, HLD, DM, mitral regurgitation, CKD, aortic valve insufficiency and PVCs here today for cardiac follow up. Echo October 2015 with LVEF=35=40%, moderate LVH, trivial AI, moderate MR. Stress myoview May 2014 without ischemia, lateral scar. Cardiac monitor 2015 with PVCs. He did not tolerate higher doses of beta blockers. He has been reluctant to consider cardiac testing and has refused to consider an ICD. He was seen in March 2019 by Truitt Merle, NP and described a syncopal event at home. He refused any workup. Echo September 2019 with LVEF=25-30%, severe LVH. Mild AI, moderate MR. He had a left lobectomy as a teenager. He was admitted to [facility] July 2022 with minimal troponin elevation. He was found to have pneumonia and Covid 19. His hospital course was complicated by severe delirium. He had NSVT and was placed on amiodarone. Echo July 2022 with LVEF=45-50% with moderate MR. He was admitted to [facility] in November 2022 with sinus dysfunction and had a pacemaker placed.   He is here today for follow up. The patient denies any chest pain, dyspnea, palpitations, lower extremity edema, orthopnea, PND, dizziness, near syncope or syncope.    Primary Care Physician: Biagio Borg, MD  Past Medical History:  Diagnosis Date   Aortic insufficiency    Coronary artery disease    coronary artery bypass graft x 5 in 1996   Diabetes mellitus    Hypercholesterolemia    non-insulin dependent   Hypertension    Ischemic cardiomyopathy    Mitral regurgitation    PVC (premature ventricular contraction)    Sinus pause 12/07/2020   s/p PPM    Past Surgical History:  Procedure Laterality Date   CORONARY ARTERY BYPASS GRAFT     LUNG SURGERY     OTHER SURGICAL HISTORY     percutaneous coronary  intervention of the  posterolateral segment on 01/27/1998   PACEMAKER IMPLANT N/A 12/07/2020   Procedure: PACEMAKER IMPLANT;  Surgeon: Constance Haw, MD;  Location: [facility];  Service: Cardiovascular;  Laterality: N/A;    Current Outpatient Medications  Medication Sig Dispense Refill   acetaminophen (TYLENOL) 325 MG tablet Take 650 mg by mouth every 6 (six) hours as needed for moderate pain or headache.     amiodarone (PACERONE) 200 MG tablet Take 1 tablet (200 mg total) by mouth daily. 90 tablet 3   Ascorbic Acid (VITAMIN C) 1000 MG tablet Take 1,000 mg by mouth daily.     aspirin EC 81 MG tablet Take 1 tablet (81 mg total) by mouth daily. Swallow whole. 30 tablet 11   benzonatate (TESSALON) 200 MG capsule Take 1 capsule (200 mg total) by mouth 2 (two) times daily as needed for cough. 20 capsule 0   bisacodyl (DULCOLAX) 10 MG suppository Place 10 mg rectally as needed for moderate constipation.     Cholecalciferol (VITAMIN D3) 10 MCG (400 UNIT) tablet Take 400 Units by mouth daily.     Cinnamon 500 MG capsule Take 1,000 mg by mouth daily.     diphenhydramine-acetaminophen (TYLENOL PM) 25-500 MG TABS tablet Take 2 tablets by mouth at bedtime.     docusate sodium (COLACE) 250 MG capsule Take 250 mg by mouth daily as needed for mild constipation.     ondansetron (ZOFRAN) 4  MG tablet Take 1 tablet (4 mg total) by mouth every 8 (eight) hours as needed for nausea or vomiting. 20 tablet 0   oxybutynin (DITROPAN) 5 MG tablet TAKE 1 TABLET EVERY 8 HOURS AS NEEDED FOR UP TO 14 DAYS FOR BLADDER SPASMS. 270 tablet 2   QUEtiapine (SEROQUEL) 50 MG tablet Take 1 tablet (50 mg total) by mouth at bedtime. 90 tablet 3   rosuvastatin (CRESTOR) 20 MG tablet TAKE 1 TABLET BY MOUTH EVERY DAY 90 tablet 1   tamsulosin (FLOMAX) 0.4 MG CAPS capsule TAKE 1 CAPSULE BY MOUTH EVERY DAY AFTER SUPPER 90 capsule 3   traZODone (DESYREL) 50 MG tablet TAKE 1/2 TO 1 TABLET BY MOUTH AT BEDTIME AS NEEDED FOR SLEEP  90 tablet 1   vitamin E 180 MG (400 UNITS) capsule Take 400 Units by mouth daily.     furosemide (LASIX) 20 MG tablet Take 1 tablet (20 mg total) by mouth as needed. (Patient taking differently: Take 20 mg by mouth as needed for fluid or edema.) 30 tablet 2   No current facility-administered medications for this visit.    No Known Allergies  Social History   Socioeconomic History   Marital status: Married    Spouse name: Not on file   Number of children: 1   Years of education: Not on file   Highest education level: Not on file  Occupational History   Occupation: Runs a Acupuncturist: SELF-EMPLOYED  Tobacco Use   Smoking status: Never   Smokeless tobacco: Never  Vaping Use   Vaping Use: Never used  Substance and Sexual Activity   Alcohol use: No   Drug use: No   Sexual activity: Not Currently  Other Topics Concern   Not on file  Social History Narrative   Not on file   Social Determinants of Health   Financial Resource Strain: Low Risk  (08/02/2021)   Overall Financial Resource Strain (CARDIA)    Difficulty of Paying Living Expenses: Not hard at all  Food Insecurity: No Food Insecurity (08/02/2021)   Hunger Vital Sign    Worried About Running Out of Food in the Last Year: Never true    [place] in the Last Year: Never true  Transportation Needs: No Transportation Needs (08/02/2021)   PRAPARE - Hydrologist (Medical): No    Lack of Transportation (Non-Medical): No  Physical Activity: Sufficiently Active (08/02/2021)   Exercise Vital Sign    Days of Exercise per Week: 5 days    Minutes of Exercise per Session: 30 min  Stress: No Stress Concern Present (08/02/2021)   Sebastian    Feeling of Stress : Not at all  Social Connections: [place] (08/02/2021)   Social Connection and Isolation Panel [NHANES]    Frequency of Communication with Friends and  Family: More than three times a week    Frequency of Social Gatherings with Friends and Family: More than three times a week    Attends Religious Services: More than 4 times per year    Active Member of [employer] or Organizations: Yes    Attends Archivist Meetings: More than 4 times per year    Marital Status: Married  Human resources officer Violence: Not At Risk (08/02/2021)   Humiliation, Afraid, Rape, and Kick questionnaire    Fear of Current or Ex-Partner: No    Emotionally Abused: No    Physically  Abused: No    Sexually Abused: No    Family History  Adopted: Yes  Problem Relation Age of Onset   CAD Neg Hx     Review of Systems:  As stated in the HPI and otherwise negative.   BP (!) 116/40   Pulse 76   Ht 5\' 10"  (1.778 m)   Wt 155 lb (70.3 kg)   SpO2 98%   BMI 22.24 kg/m   Physical Examination: General: Well developed, well nourished, NAD  HEENT: OP clear, mucus membranes moist  SKIN: warm, dry. No rashes. Neuro: No focal deficits  Musculoskeletal: Muscle strength 5/5 all ext  Psychiatric: Mood and affect normal  Neck: No JVD, no carotid bruits, no thyromegaly, no lymphadenopathy.  Lungs:Clear bilaterally, no wheezes, rhonci, crackles Cardiovascular: Regular rate and rhythm. No murmurs, gallops or rubs. Abdomen:Soft. Bowel sounds present. Non-tender.  Extremities: No lower extremity edema. Pulses are 2 + in the bilateral DP/PT.  Echo July 2022:  1. Left ventricular ejection fraction, by estimation, is 45 to 50%. The  left ventricle has mildly decreased function. Left ventricular endocardial  border not optimally defined to evaluate regional wall motion. There is  severe asymmetric left ventricular   hypertrophy of the septal segment. Left ventricular diastolic parameters  are indeterminate.   2. Right ventricular systolic function is normal. The right ventricular  size is normal. Tricuspid regurgitation signal is inadequate for assessing  PA pressure.   3.  Left atrial size was mildly dilated.   4. The mitral valve is abnormal. Moderate mitral valve regurgitation. No  evidence of mitral stenosis.   5. Tricuspid valve regurgitation is mild to moderate.   6. The aortic valve is tricuspid. There is mild calcification of the  aortic valve. There is mild thickening of the aortic valve.   7. Aortic dilatation noted. There is mild dilatation of the aortic root,  measuring 40 mm.  EKG:  EKG is not ordered today. The ekg ordered today demonstrates   Recent Labs: 12/06/2020: Magnesium 2.1 06/26/2021: ALT 15; BUN 31; Creatinine, Ser 1.72; Hemoglobin 10.6; Platelets 206.0; Potassium 4.8; Sodium 137; TSH 5.01   Lipid Panel    Component Value Date/Time   CHOL 84 06/26/2021 1609   CHOL 98 (L) 12/01/2018 0808   TRIG 173.0 (H) 06/26/2021 1609   TRIG 102 10/22/2005 0730   HDL 34.50 (L) 06/26/2021 1609   HDL 41 12/01/2018 0808   CHOLHDL 2 06/26/2021 1609   VLDL 34.6 06/26/2021 1609   LDLCALC 15 06/26/2021 1609   LDLCALC 44 07/17/2019 1022     Wt Readings from Last 3 Encounters:  09/27/21 155 lb (70.3 kg)  07/21/21 151 lb 6.4 oz (68.7 kg)  05/31/21 153 lb (69.4 kg)     Other studies Reviewed: Additional studies/ records that were reviewed today include: . Review of the above records demonstrates:   Assessment and Plan:   1. CAD without angina: No chest pain. Continue ASA, statin   2. Cardiomyopathy, ischemic: LVEF=45-50% by echo July 2022. He is now off of the beta blocker and ARB due to hypotension. Will not restart at this time.   3. Hyperlipidemia: LDL at goal in June 2023. Continue statin.     4. LBBB: Chronic.   5. HTN: BP is well controlled. NO changes  6. Chronic systolic CHF: No volume overload on exam. Weight is stable. Continue Lasix prn   7. Sinus node dysfunction: Pacemaker in place  Current medicines are reviewed at length with the patient  today.  The patient does not have concerns regarding medicines.  The following  changes have been made:  no change  Labs/ tests ordered today include:   No orders of the defined types were placed in this encounter.   Disposition:   F/U with me in 12 months   Signed, Lauree Chandler, MD 09/27/2021 4:33 PM    [facility] [place], [place], [place]  [address] Phone: [phone]; Fax: [phone]

## 2021-09-27 NOTE — Progress Notes (Signed)
Remote pacemaker transmission.   

## 2021-09-27 NOTE — Patient Instructions (Signed)
Medication Instructions:  No changes *If you need a refill on your cardiac medications before your next appointment, please call your pharmacy*   Lab Work: none If you have labs (blood work) drawn today and your tests are completely normal, you will receive your results only by: MyChart Message (if you have MyChart) OR A paper copy in the mail If you have any lab test that is abnormal or we need to change your treatment, we will call you to review the results.   Testing/Procedures: none   Follow-Up: At [facility], you and your health needs are our priority.  As part of our continuing mission to provide you with exceptional heart care, we have created designated Provider Care Teams.  These Care Teams include your primary Cardiologist (physician) and Advanced Practice Providers (APPs -  Physician Assistants and Nurse Practitioners) who all work together to provide you with the care you need, when you need it.  We recommend signing up for the patient portal called "MyChart".  Sign up information is provided on this After Visit Summary.  MyChart is used to connect with patients for Virtual Visits (Telemedicine).  Patients are able to view lab/test results, encounter notes, upcoming appointments, etc.  Non-urgent messages can be sent to your provider as well.   To learn more about what you can do with MyChart, go to [email or web address].    Your next appointment:   12 month(s)  The format for your next appointment:   In Person  Provider:   Christopher McAlhany, MD      Important Information About Sugar       

## 2021-10-21 ENCOUNTER — Other Ambulatory Visit: Payer: Self-pay | Admitting: Internal Medicine

## 2021-10-27 ENCOUNTER — Other Ambulatory Visit: Payer: Self-pay | Admitting: Internal Medicine

## 2021-10-27 NOTE — Telephone Encounter (Signed)
Please refill as per office routine med refill policy (all routine meds to be refilled for 3 mo or monthly (per pt preference) up to one year from last visit, then month to month grace period for 3 mo, then further med refills will have to be denied) ? ?

## 2021-11-17 DIAGNOSIS — R338 Other retention of urine: Secondary | ICD-10-CM | POA: Diagnosis not present

## 2021-12-08 ENCOUNTER — Ambulatory Visit (INDEPENDENT_AMBULATORY_CARE_PROVIDER_SITE_OTHER): Payer: Medicare HMO

## 2021-12-08 DIAGNOSIS — I255 Ischemic cardiomyopathy: Secondary | ICD-10-CM | POA: Diagnosis not present

## 2021-12-09 LAB — CUP PACEART REMOTE DEVICE CHECK
Battery Remaining Longevity: 165 mo
Battery Voltage: 3.12 V
Brady Statistic AP VP Percent: 0.12 %
Brady Statistic AP VS Percent: 48.42 %
Brady Statistic AS VP Percent: 0.05 %
Brady Statistic AS VS Percent: 51.41 %
Brady Statistic RA Percent Paced: 52.1 %
Brady Statistic RV Percent Paced: 0.17 %
Date Time Interrogation Session: 20231201031506
Implantable Lead Connection Status: 753985
Implantable Lead Connection Status: 753985
Implantable Lead Implant Date: 20221130
Implantable Lead Implant Date: 20221130
Implantable Lead Location: 753859
Implantable Lead Location: 753860
Implantable Lead Model: 5076
Implantable Lead Model: 5076
Implantable Pulse Generator Implant Date: 20221130
Lead Channel Impedance Value: 266 Ohm
Lead Channel Impedance Value: 361 Ohm
Lead Channel Impedance Value: 399 Ohm
Lead Channel Impedance Value: 570 Ohm
Lead Channel Pacing Threshold Amplitude: 0.5 V
Lead Channel Pacing Threshold Amplitude: 0.625 V
Lead Channel Pacing Threshold Pulse Width: 0.4 ms
Lead Channel Pacing Threshold Pulse Width: 0.4 ms
Lead Channel Sensing Intrinsic Amplitude: 1.5 mV
Lead Channel Sensing Intrinsic Amplitude: 1.5 mV
Lead Channel Sensing Intrinsic Amplitude: 7.125 mV
Lead Channel Sensing Intrinsic Amplitude: 7.125 mV
Lead Channel Setting Pacing Amplitude: 1.5 V
Lead Channel Setting Pacing Amplitude: 2 V
Lead Channel Setting Pacing Pulse Width: 0.4 ms
Lead Channel Setting Sensing Sensitivity: 0.9 mV
Zone Setting Status: 755011
Zone Setting Status: 755011

## 2021-12-15 DIAGNOSIS — R338 Other retention of urine: Secondary | ICD-10-CM | POA: Diagnosis not present

## 2021-12-26 NOTE — Progress Notes (Signed)
Remote pacemaker transmission.   

## 2022-01-10 ENCOUNTER — Other Ambulatory Visit: Payer: Self-pay | Admitting: Internal Medicine

## 2022-01-10 MED ORDER — QUETIAPINE FUMARATE 50 MG PO TABS: ORAL_TABLET | ORAL | 3 refills | Status: DC

## 2022-01-12 DIAGNOSIS — R338 Other retention of urine: Secondary | ICD-10-CM | POA: Diagnosis not present

## 2022-02-09 DIAGNOSIS — R338 Other retention of urine: Secondary | ICD-10-CM | POA: Diagnosis not present

## 2022-03-09 ENCOUNTER — Ambulatory Visit: Payer: Medicare HMO

## 2022-03-09 DIAGNOSIS — I255 Ischemic cardiomyopathy: Secondary | ICD-10-CM | POA: Diagnosis not present

## 2022-03-09 DIAGNOSIS — R338 Other retention of urine: Secondary | ICD-10-CM | POA: Diagnosis not present

## 2022-03-09 LAB — CUP PACEART REMOTE DEVICE CHECK
Battery Remaining Longevity: 162 mo
Battery Voltage: 3.08 V
Brady Statistic AP VP Percent: 0.07 %
Brady Statistic AP VS Percent: 43.38 %
Brady Statistic AS VP Percent: 0.06 %
Brady Statistic AS VS Percent: 56.49 %
Brady Statistic RA Percent Paced: 46.35 %
Brady Statistic RV Percent Paced: 0.13 %
Date Time Interrogation Session: 20240301093152
Implantable Lead Connection Status: 753985
Implantable Lead Connection Status: 753985
Implantable Lead Implant Date: 20221130
Implantable Lead Implant Date: 20221130
Implantable Lead Location: 753859
Implantable Lead Location: 753860
Implantable Lead Model: 5076
Implantable Lead Model: 5076
Implantable Pulse Generator Implant Date: 20221130
Lead Channel Impedance Value: 266 Ohm
Lead Channel Impedance Value: 361 Ohm
Lead Channel Impedance Value: 418 Ohm
Lead Channel Impedance Value: 570 Ohm
Lead Channel Pacing Threshold Amplitude: 0.5 V
Lead Channel Pacing Threshold Amplitude: 0.625 V
Lead Channel Pacing Threshold Pulse Width: 0.4 ms
Lead Channel Pacing Threshold Pulse Width: 0.4 ms
Lead Channel Sensing Intrinsic Amplitude: 2.125 mV
Lead Channel Sensing Intrinsic Amplitude: 2.125 mV
Lead Channel Sensing Intrinsic Amplitude: 8.625 mV
Lead Channel Sensing Intrinsic Amplitude: 8.625 mV
Lead Channel Setting Pacing Amplitude: 1.5 V
Lead Channel Setting Pacing Amplitude: 2 V
Lead Channel Setting Pacing Pulse Width: 0.4 ms
Lead Channel Setting Sensing Sensitivity: 0.9 mV
Zone Setting Status: 755011
Zone Setting Status: 755011

## 2022-03-19 ENCOUNTER — Other Ambulatory Visit (HOSPITAL_BASED_OUTPATIENT_CLINIC_OR_DEPARTMENT_OTHER): Payer: Self-pay | Admitting: Family

## 2022-03-19 DIAGNOSIS — I4729 Other ventricular tachycardia: Secondary | ICD-10-CM

## 2022-03-19 DIAGNOSIS — R000 Tachycardia, unspecified: Secondary | ICD-10-CM

## 2022-03-19 DIAGNOSIS — Z79899 Other long term (current) drug therapy: Secondary | ICD-10-CM

## 2022-03-19 NOTE — Telephone Encounter (Signed)
Patient of Dr. McAlhany. Please review for refill. Thank you!  

## 2022-04-02 NOTE — Progress Notes (Signed)
Remote pacemaker transmission.   

## 2022-04-09 DIAGNOSIS — R338 Other retention of urine: Secondary | ICD-10-CM | POA: Diagnosis not present

## 2022-04-20 ENCOUNTER — Other Ambulatory Visit: Payer: Self-pay | Admitting: Internal Medicine

## 2022-05-04 ENCOUNTER — Encounter: Payer: Self-pay | Admitting: Physician Assistant

## 2022-05-04 ENCOUNTER — Ambulatory Visit: Payer: Medicare HMO | Attending: Physician Assistant | Admitting: Physician Assistant

## 2022-05-04 VITALS — BP 134/68 | HR 65 | Ht 70.0 in | Wt 166.2 lb

## 2022-05-04 DIAGNOSIS — I493 Ventricular premature depolarization: Secondary | ICD-10-CM

## 2022-05-04 DIAGNOSIS — I509 Heart failure, unspecified: Secondary | ICD-10-CM | POA: Diagnosis not present

## 2022-05-04 DIAGNOSIS — E1122 Type 2 diabetes mellitus with diabetic chronic kidney disease: Secondary | ICD-10-CM | POA: Diagnosis not present

## 2022-05-04 DIAGNOSIS — I255 Ischemic cardiomyopathy: Secondary | ICD-10-CM | POA: Diagnosis not present

## 2022-05-04 DIAGNOSIS — Z79899 Other long term (current) drug therapy: Secondary | ICD-10-CM

## 2022-05-04 DIAGNOSIS — I4729 Other ventricular tachycardia: Secondary | ICD-10-CM | POA: Diagnosis not present

## 2022-05-04 DIAGNOSIS — I2510 Atherosclerotic heart disease of native coronary artery without angina pectoris: Secondary | ICD-10-CM | POA: Diagnosis not present

## 2022-05-04 DIAGNOSIS — I5022 Chronic systolic (congestive) heart failure: Secondary | ICD-10-CM | POA: Diagnosis not present

## 2022-05-04 DIAGNOSIS — Z950 Presence of cardiac pacemaker: Secondary | ICD-10-CM

## 2022-05-04 LAB — CUP PACEART INCLINIC DEVICE CHECK
Battery Remaining Longevity: 161 mo
Battery Voltage: 3.06 V
Brady Statistic AP VP Percent: 0.09 %
Brady Statistic AP VS Percent: 35.9 %
Brady Statistic AS VP Percent: 0.05 %
Brady Statistic AS VS Percent: 63.95 %
Brady Statistic RA Percent Paced: 38.1 %
Brady Statistic RV Percent Paced: 0.14 %
Date Time Interrogation Session: 20240426171528
Implantable Lead Connection Status: 753985
Implantable Lead Connection Status: 753985
Implantable Lead Implant Date: 20221130
Implantable Lead Implant Date: 20221130
Implantable Lead Location: 753859
Implantable Lead Location: 753860
Implantable Lead Model: 5076
Implantable Lead Model: 5076
Implantable Pulse Generator Implant Date: 20221130
Lead Channel Impedance Value: 304 Ohm
Lead Channel Impedance Value: 437 Ohm
Lead Channel Impedance Value: 475 Ohm
Lead Channel Impedance Value: 665 Ohm
Lead Channel Pacing Threshold Amplitude: 0.625 V
Lead Channel Pacing Threshold Amplitude: 0.625 V
Lead Channel Pacing Threshold Pulse Width: 0.4 ms
Lead Channel Pacing Threshold Pulse Width: 0.4 ms
Lead Channel Sensing Intrinsic Amplitude: 1.5 mV
Lead Channel Sensing Intrinsic Amplitude: 14.125 mV
Lead Channel Sensing Intrinsic Amplitude: 2 mV
Lead Channel Sensing Intrinsic Amplitude: 8.875 mV
Lead Channel Setting Pacing Amplitude: 1.5 V
Lead Channel Setting Pacing Amplitude: 2 V
Lead Channel Setting Pacing Pulse Width: 0.4 ms
Lead Channel Setting Sensing Sensitivity: 0.9 mV
Zone Setting Status: 755011
Zone Setting Status: 755011

## 2022-05-04 NOTE — Progress Notes (Signed)
Cardiology Office Note Date:  05/04/2022  Patient ID:  Philip, Kerr 1933/10/17, MRN [id number] PCP:  Corwin Levins, MD  Cardiologist:  Dr. Clifton James Electrophysiologist: Dr. Elberta Fortis    Chief Complaint:  annual visit  History of Present Illness: Philip Kerr is a 89 y.o. male with history of CAD (CABG 1996), HTN, HLD, DM, CKD (II), sinus node dysfunction w/PPM, LBBB, PVCs, ICM, chronic CHF  Hx of L lobectomy as a child, he reports living in an orphan[place] his entire childhood, at about 89 y/o developed a persistent cough, lost significant weight and eventually evaluated, found to have "some kind of infection" and they took out part of a lung, he wasn't really given much information that he recalls, but has had a chronic cough ever since  March 2019 by Norma Fredrickson, NP and described a syncopal event at home. He refused any workup.  Eventually Echo September 2019 with LVEF=25-30%, severe LVH. Mild AI, moderate MR   Echo July 2022 with LVEF=45-50% with moderate MR.   He saw Dr. Elberta Fortis 03/16/21, doing well, unclear medication list, pt was to call with accurate list, volume stable, no changes were made.  Saw Dr. Clifton James 09/27/21, doing well, no symptoms, no changes were made.  TODAY Has his chronic cough but no SOB/DOE He has a driveway 1/2 mile long walks down and back every day a mile with good exertional capacity No CP. Rarely feels a little skip in his heart beat No near syncope or syncope. 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  Device information MDT dual chamber PPM implanted 12/07/20  AAD hx July 2022 amiodarone for PVCs/NSVTs during a hospitalization  Past Medical History:  Diagnosis Date   Aortic insufficiency    Coronary artery disease    coronary artery bypass graft x 5 in 1996   Diabetes mellitus    Hypercholesterolemia    non-insulin dependent   Hypertension    Ischemic cardiomyopathy    Mitral regurgitation    PVC (premature  ventricular contraction)    Sinus pause 12/07/2020   s/p PPM    Past Surgical History:  Procedure Laterality Date   CORONARY ARTERY BYPASS GRAFT     LUNG SURGERY     OTHER SURGICAL HISTORY     percutaneous coronary intervention of the  posterolateral segment on 01/27/1998   PACEMAKER IMPLANT N/A 12/07/2020   Procedure: PACEMAKER IMPLANT;  Surgeon: Regan Lemming, MD;  Location: [facility];  Service: Cardiovascular;  Laterality: N/A;    Current Outpatient Medications  Medication Sig Dispense Refill   acetaminophen (TYLENOL) 325 MG tablet Take 650 mg by mouth every 6 (six) hours as needed for moderate pain or headache.     amiodarone (PACERONE) 200 MG tablet Take 1 tablet (200 mg total) by mouth daily. 90 tablet 3   Ascorbic Acid (VITAMIN C) 1000 MG tablet Take 1,000 mg by mouth daily.     aspirin EC 81 MG tablet Take 1 tablet (81 mg total) by mouth daily. Swallow whole. 30 tablet 11   benzonatate (TESSALON) 200 MG capsule Take 1 capsule (200 mg total) by mouth 2 (two) times daily as needed for cough. 20 capsule 0   bisacodyl (DULCOLAX) 10 MG suppository Place 10 mg rectally as needed for moderate constipation.     Cholecalciferol (VITAMIN D3) 10 MCG (400 UNIT) tablet Take 400 Units by mouth daily.     Cinnamon 500 MG capsule Take 1,000 mg by  mouth daily.     diphenhydramine-acetaminophen (TYLENOL PM) 25-500 MG TABS tablet Take 2 tablets by mouth at bedtime.     docusate sodium (COLACE) 250 MG capsule Take 250 mg by mouth daily as needed for mild constipation.     doxazosin (CARDURA) 2 MG tablet TAKE 1 TABLET (2 MG TOTAL) BY MOUTH AT BEDTIME. 90 tablet 2   furosemide (LASIX) 20 MG tablet Take 1 tablet (20 mg total) by mouth as needed. (Patient taking differently: Take 20 mg by mouth as needed for fluid or edema.) 30 tablet 2   ondansetron (ZOFRAN) 4 MG tablet Take 1 tablet (4 mg total) by mouth every 8 (eight) hours as needed for nausea or vomiting. 20 tablet 0   oxybutynin  (DITROPAN) 5 MG tablet TAKE 1 TABLET EVERY 8 HOURS AS NEEDED FOR UP TO 14 DAYS FOR BLADDER SPASMS. 270 tablet 2   QUEtiapine (SEROQUEL) 50 MG tablet 1/2 tab by mouth in the AM, and 1 tab at bedtime 135 tablet 3   rosuvastatin (CRESTOR) 20 MG tablet TAKE 1 TABLET BY MOUTH EVERY DAY 90 tablet 1   tamsulosin (FLOMAX) 0.4 MG CAPS capsule TAKE 1 CAPSULE BY MOUTH EVERY DAY AFTER SUPPER 90 capsule 3   traZODone (DESYREL) 50 MG tablet TAKE 1/2 TO 1 TABLET BY MOUTH AT BEDTIME AS NEEDED FOR SLEEP 90 tablet 0   vitamin E 180 MG (400 UNITS) capsule Take 400 Units by mouth daily.     No current facility-administered medications for this visit.    Allergies:   Patient has no known allergies.   Social History:  The patient  reports that he has never smoked. He has never used smokeless tobacco. He reports that he does not drink alcohol and does not use drugs.   Family History:  The patient's family history is not on file. He was adopted.  ROS:  Please see the history of present illness.    All other systems are reviewed and otherwise negative.   PHYSICAL EXAM:  VS:  There were no vitals taken for this visit. BMI: There is no height or weight on file to calculate BMI. Well nourished, well developed, in no acute distress HEENT: normocephalic, atraumatic Neck: no JVD, carotid bruits or masses Cardiac:   RRR; no significant murmurs, no rubs, or gallops Lungs:   CTA b/l, no wheezing, rhonchi or rales Abd: soft, nontender MS: no deformity or  atrophy Ext:  no edema Skin: warm and dry, no rash Neuro:  No gross deficits appreciated Psych: euthymic mood, full affect   PPM site is stable, no tethering or discomfort   EKG:  Done today and reviewed by myself shows  SR 65bpm, 1st degree AVblock , LAD, LBBB, no significant changes  Device interrogation done today and reviewed by myself:  Battery and lead measurements are good NSVT 03/18/22, one second and 06/23/21 also one second    TTE 07/31/20    1. Left ventricular ejection fraction, by estimation, is 45 to 50%. The  left ventricle has mildly decreased function. Left ventricular endocardial  border not optimally defined to evaluate regional wall motion. There is  severe asymmetric left ventricular   hypertrophy of the septal segment. Left ventricular diastolic parameters  are indeterminate.   2. Right ventricular systolic function is normal. The right ventricular  size is normal. Tricuspid regurgitation signal is inadequate for assessing  PA pressure.   3. Left atrial size was mildly dilated.   4. The mitral valve is abnormal. Moderate  mitral valve regurgitation. No  evidence of mitral stenosis.   5. Tricuspid valve regurgitation is mild to moderate.   6. The aortic valve is tricuspid. There is mild calcification of the  aortic valve. There is mild thickening of the aortic valve.   7. Aortic dilatation noted. There is mild dilatation of the aortic root,  measuring 40 mm.   Recent Labs: 06/26/2021: ALT 15; BUN 31; Creatinine, Ser 1.72; Hemoglobin 10.6; Platelets 206.0; Potassium 4.8; Sodium 137; TSH 5.01  06/26/2021: Cholesterol 84; HDL 34.50; LDL Cholesterol 15; Total CHOL/HDL Ratio 2; Triglycerides 173.0; VLDL 34.6   CrCl cannot be calculated (Patient's most recent lab result is older than the maximum 21 days allowed.).   Wt Readings from Last 3 Encounters:  09/27/21 155 lb (70.3 kg)  07/21/21 151 lb 6.4 oz (68.7 kg)  05/31/21 153 lb (69.4 kg)     Other studies reviewed: Additional studies/records reviewed today include: summarized above  ASSESSMENT AND PLAN:  PPM Intact function No changes made  PVCs NSVT Amiodarone No SOB or change to his chronic cough Labs today  CAD No CP C/w Dr. McAlhany/team  ICM Chronic CHF Improved LVEF by last echo 45-50% in 2022  No symptoms or exam findings of volume OL    Disposition: F/u with remotes as usual, in clinic with EP again in a year, sooner if needed  Current  medicines are reviewed at length with the patient today.  The patient did not have any concerns regarding medicines.  Norma Fredrickson, PA-C 05/04/2022 6:32 AM     [facility] [address] [place] [address] [phone] (office)  [phone] (fax)

## 2022-05-04 NOTE — Patient Instructions (Addendum)
Medication Instructions:   Your physician recommends that you continue on your current medications as directed. Please refer to the Current Medication list given to you today.\  *If you need a refill on your cardiac medications before your next appointment, please call your pharmacy*   Lab Work:  CMET  AND TSH TODAY   If you have labs (blood work) drawn today and your tests are completely normal, you will receive your results only by: MyChart Message (if you have MyChart) OR A paper copy in the mail If you have any lab test that is abnormal or we need to change your treatment, we will call you to review the results.   Testing/Procedures: NONE ORDERED  TODAY\    Follow-Up: At [facility], you and your health needs are our priority.  As part of our continuing mission to provide you with exceptional heart care, we have created designated Provider Care Teams.  These Care Teams include your primary Cardiologist (physician) and Advanced Practice Providers (APPs -  Physician Assistants and Nurse Practitioners) who all work together to provide you with the care you need, when you need it.  We recommend signing up for the patient portal called "MyChart".  Sign up information is provided on this After Visit Summary.  MyChart is used to connect with patients for Virtual Visits (Telemedicine).  Patients are able to view lab/test results, encounter notes, upcoming appointments, etc.  Non-urgent messages can be sent to your provider as well.   To learn more about what you can do with MyChart, go to [email or web address].    Your next appointment:   1 year(s)  Provider:   You may see  Dr. Elberta Fortis or one of the following Advanced Practice Providers on your designated Care Team:   Francis Dowse, [place]    Other Instructions

## 2022-05-05 LAB — TSH: TSH: 3.36 u[IU]/mL (ref 0.450–4.500)

## 2022-05-05 LAB — COMPREHENSIVE METABOLIC PANEL
ALT: 12 IU/L (ref 0–44)
AST: 16 IU/L (ref 0–40)
Albumin/Globulin Ratio: 1.3 (ref 1.2–2.2)
Albumin: 3.5 g/dL — ABNORMAL LOW (ref 3.7–4.7)
Alkaline Phosphatase: 68 IU/L (ref 44–121)
BUN/Creatinine Ratio: 17 (ref 10–24)
BUN: 22 mg/dL (ref 8–27)
Bilirubin Total: 0.3 mg/dL (ref 0.0–1.2)
CO2: 19 mmol/L — ABNORMAL LOW (ref 20–29)
Calcium: 9.2 mg/dL (ref 8.6–10.2)
Chloride: 106 mmol/L (ref 96–106)
Creatinine, Ser: 1.29 mg/dL — ABNORMAL HIGH (ref 0.76–1.27)
Globulin, Total: 2.8 g/dL (ref 1.5–4.5)
Glucose: 124 mg/dL — ABNORMAL HIGH (ref 70–99)
Potassium: 4.7 mmol/L (ref 3.5–5.2)
Sodium: 140 mmol/L (ref 134–144)
Total Protein: 6.3 g/dL (ref 6.0–8.5)
eGFR: 54 mL/min/{1.73_m2} — ABNORMAL LOW (ref >59)

## 2022-05-07 DIAGNOSIS — R338 Other retention of urine: Secondary | ICD-10-CM | POA: Diagnosis not present

## 2022-06-01 ENCOUNTER — Telehealth: Payer: Self-pay | Admitting: Cardiovascular Disease

## 2022-06-01 NOTE — Telephone Encounter (Signed)
Pt c/o BP issue: STAT if pt c/o blurred vision, one-sided weakness or slurred speech  1. What are your last 5 BP readings? 149/88 pulse 71 156/75 pulse 78 - she said his blood pressure have been running high- she had readings, but had threw them away  2. Are you having any other symptoms (ex. Dizziness, headache, blurred vision, passed out)? No, both shoulders and upper back  have been hurting  3. What is your BP issue? Blood pressure have been running high- wife wants patient to have an appointment

## 2022-06-01 NOTE — Telephone Encounter (Signed)
Scheduling called patient back and scheduled for 5/29 with CM/mw

## 2022-06-06 ENCOUNTER — Ambulatory Visit: Payer: Medicare HMO | Attending: Cardiovascular Disease | Admitting: Cardiovascular Disease

## 2022-06-06 ENCOUNTER — Encounter: Payer: Self-pay | Admitting: Cardiovascular Disease

## 2022-06-06 VITALS — BP 122/60 | HR 80 | Ht 70.0 in | Wt 164.8 lb

## 2022-06-06 DIAGNOSIS — I495 Sick sinus syndrome: Secondary | ICD-10-CM | POA: Diagnosis not present

## 2022-06-06 DIAGNOSIS — I340 Nonrheumatic mitral (valve) insufficiency: Secondary | ICD-10-CM

## 2022-06-06 DIAGNOSIS — R338 Other retention of urine: Secondary | ICD-10-CM | POA: Diagnosis not present

## 2022-06-06 DIAGNOSIS — I10 Essential (primary) hypertension: Secondary | ICD-10-CM | POA: Diagnosis not present

## 2022-06-06 DIAGNOSIS — E785 Hyperlipidemia, unspecified: Secondary | ICD-10-CM | POA: Diagnosis not present

## 2022-06-06 DIAGNOSIS — I2510 Atherosclerotic heart disease of native coronary artery without angina pectoris: Secondary | ICD-10-CM

## 2022-06-06 DIAGNOSIS — I5022 Chronic systolic (congestive) heart failure: Secondary | ICD-10-CM

## 2022-06-06 DIAGNOSIS — I255 Ischemic cardiomyopathy: Secondary | ICD-10-CM | POA: Diagnosis not present

## 2022-06-06 MED ORDER — AMLODIPINE BESYLATE 5 MG PO TABS: 5.0000 mg | ORAL_TABLET | Freq: Every day | ORAL | 3 refills | Status: DC

## 2022-06-06 NOTE — Patient Instructions (Signed)
Medication Instructions:  Your physician has recommended you make the following change in your medication:  START: amlodipine (Norvasc) 5 mg by mouth once daily  *If you need a refill on your cardiac medications before your next appointment, please call your pharmacy*   Lab Work: NONE If you have labs (blood work) drawn today and your tests are completely normal, you will receive your results only by: MyChart Message (if you have MyChart) OR A paper copy in the mail If you have any lab test that is abnormal or we need to change your treatment, we will call you to review the results.   Testing/Procedures: NONE   Follow-Up: At [facility], you and your health needs are our priority.  As part of our continuing mission to provide you with exceptional heart care, we have created designated Provider Care Teams.  These Care Teams include your primary Cardiologist (physician) and Advanced Practice Providers (APPs -  Physician Assistants and Nurse Practitioners) who all work together to provide you with the care you need, when you need it.  We recommend signing up for the patient portal called "MyChart".  Sign up information is provided on this After Visit Summary.  MyChart is used to connect with patients for Virtual Visits (Telemedicine).  Patients are able to view lab/test results, encounter notes, upcoming appointments, etc.  Non-urgent messages can be sent to your provider as well.   To learn more about what you can do with MyChart, go to [email or web address].    Your next appointment:   1 year(s)  Provider:   Verne Carrow, MD

## 2022-06-06 NOTE — Progress Notes (Signed)
Chief Complaint  Patient presents with   Follow-up    CAD    History of Present Illness: 89 yo male with history of CAD s/p 5V CABG 1996, ischemic cardiomyopathy, sinus node dysfunction s/p pacemaker, LBBB, HTN, HLD, DM, mitral regurgitation, CKD, aortic valve insufficiency and PVCs here today for cardiac follow up. Echo October 2015 with LVEF=35=40%, moderate LVH, trivial AI, moderate MR. Stress myoview May 2014 without ischemia, lateral scar. Cardiac monitor 2015 with PVCs. He did not tolerate higher doses of beta blockers. He has been reluctant to consider cardiac testing and has refused to consider an ICD. He was seen in March 2019 by Norma Fredrickson, NP and described a syncopal event at home. He refused any workup. Echo September 2019 with LVEF=25-30%, severe LVH. Mild AI, moderate MR. He had a left lobectomy as a teenager. He was admitted to [facility] July 2022 with minimal troponin elevation. He was found to have pneumonia and Covid 19. His hospital course was complicated by severe delirium. He had NSVT and was placed on amiodarone. Echo July 2022 with LVEF=45-50% with moderate MR. He was admitted to [facility] in November 2022 with bradycardia/sinus node dysfunction and had a pacemaker placed.   He is here today for follow up. He called our office last week and reported elevated blood pressures. He has been intolerant of beta blocker and ARB was stopped due to renal insufficiency. He denies any chest pain, dyspnea, palpitations, lower extremity edema, orthopnea, PND, dizziness, near syncope or syncope. He has been having neck pain.    Primary Care Physician: Corwin Levins, MD  Past Medical History:  Diagnosis Date   Aortic insufficiency    Coronary artery disease    coronary artery bypass graft x 5 in 1996   Diabetes mellitus    Hypercholesterolemia    non-insulin dependent   Hypertension    Ischemic cardiomyopathy    Mitral regurgitation    PVC (premature ventricular contraction)    Sinus  pause 12/07/2020   s/p PPM    Past Surgical History:  Procedure Laterality Date   CORONARY ARTERY BYPASS GRAFT     LUNG SURGERY     OTHER SURGICAL HISTORY     percutaneous coronary intervention of the  posterolateral segment on 01/27/1998   PACEMAKER IMPLANT N/A 12/07/2020   Procedure: PACEMAKER IMPLANT;  Surgeon: Regan Lemming, MD;  Location: [facility] INVASIVE CV LAB;  Service: Cardiovascular;  Laterality: N/A;    Current Outpatient Medications  Medication Sig Dispense Refill   acetaminophen (TYLENOL) 325 MG tablet Take 650 mg by mouth every 6 (six) hours as needed for moderate pain or headache.     amiodarone (PACERONE) 200 MG tablet Take 1 tablet (200 mg total) by mouth daily. 90 tablet 3   amLODipine (NORVASC) 5 MG tablet Take 1 tablet (5 mg total) by mouth daily. 90 tablet 3   Ascorbic Acid (VITAMIN C) 1000 MG tablet Take 1,000 mg by mouth daily.     aspirin EC 81 MG tablet Take 1 tablet (81 mg total) by mouth daily. Swallow whole. 30 tablet 11   benzonatate (TESSALON) 200 MG capsule Take 1 capsule (200 mg total) by mouth 2 (two) times daily as needed for cough. 20 capsule 0   bisacodyl (DULCOLAX) 10 MG suppository Place 10 mg rectally as needed for moderate constipation.     Cholecalciferol (VITAMIN D3) 10 MCG (400 UNIT) tablet Take 400 Units by mouth daily.     Cinnamon 500 MG capsule  Take 1,000 mg by mouth daily.     diphenhydramine-acetaminophen (TYLENOL PM) 25-500 MG TABS tablet Take 2 tablets by mouth at bedtime.     docusate sodium (COLACE) 250 MG capsule Take 250 mg by mouth daily as needed for mild constipation.     doxazosin (CARDURA) 2 MG tablet TAKE 1 TABLET (2 MG TOTAL) BY MOUTH AT BEDTIME. 90 tablet 2   ondansetron (ZOFRAN) 4 MG tablet Take 1 tablet (4 mg total) by mouth every 8 (eight) hours as needed for nausea or vomiting. 20 tablet 0   oxybutynin (DITROPAN) 5 MG tablet TAKE 1 TABLET EVERY 8 HOURS AS NEEDED FOR UP TO 14 DAYS FOR BLADDER SPASMS. 270 tablet 2    rosuvastatin (CRESTOR) 20 MG tablet TAKE 1 TABLET BY MOUTH EVERY DAY 90 tablet 1   tamsulosin (FLOMAX) 0.4 MG CAPS capsule TAKE 1 CAPSULE BY MOUTH EVERY DAY AFTER SUPPER 90 capsule 3   traZODone (DESYREL) 50 MG tablet TAKE 1/2 TO 1 TABLET BY MOUTH AT BEDTIME AS NEEDED FOR SLEEP 90 tablet 0   vitamin E 180 MG (400 UNITS) capsule Take 400 Units by mouth daily.     furosemide (LASIX) 20 MG tablet Take 1 tablet (20 mg total) by mouth as needed. (Patient taking differently: Take 20 mg by mouth as needed for fluid or edema.) 30 tablet 2   No current facility-administered medications for this visit.    No Known Allergies  Social History   Socioeconomic History   Marital status: Married    Spouse name: Not on file   Number of children: 1   Years of education: Not on file   Highest education level: Not on file  Occupational History   Occupation: Runs a Science writer: SELF-EMPLOYED  Tobacco Use   Smoking status: Never   Smokeless tobacco: Never  Vaping Use   Vaping Use: Never used  Substance and Sexual Activity   Alcohol use: No   Drug use: No   Sexual activity: Not Currently  Other Topics Concern   Not on file  Social History Narrative   Not on file   Social Determinants of Health   Financial Resource Strain: Low Risk  (08/02/2021)   Overall Financial Resource Strain (CARDIA)    Difficulty of Paying Living Expenses: Not hard at all  Food Insecurity: No Food Insecurity (08/02/2021)   Hunger Vital Sign    Worried About Running Out of Food in the Last Year: Never true    Ran Out of Food in the Last Year: Never true  Transportation Needs: No Transportation Needs (08/02/2021)   PRAPARE - Administrator, Civil Service (Medical): No    Lack of Transportation (Non-Medical): No  Physical Activity: Sufficiently Active (08/02/2021)   Exercise Vital Sign    Days of Exercise per Week: 5 days    Minutes of Exercise per Session: 30 min  Stress: No Stress Concern  Present (08/02/2021)   [employer] of Occupational Health - Occupational Stress Questionnaire    Feeling of Stress : Not at all  Social Connections: Socially Integrated (08/02/2021)   Social Connection and Isolation Panel [NHANES]    Frequency of Communication with Friends and Family: More than three times a week    Frequency of Social Gatherings with Friends and Family: More than three times a week    Attends Religious Services: More than 4 times per year    Active Member of [employer] or Organizations: Yes  Attends Banker Meetings: More than 4 times per year    Marital Status: Married  Catering manager Violence: Not At Risk (08/02/2021)   Humiliation, Afraid, Rape, and Kick questionnaire    Fear of Current or Ex-Partner: No    Emotionally Abused: No    Physically Abused: No    Sexually Abused: No    Family History  Adopted: Yes  Problem Relation Age of Onset   CAD Neg Hx     Review of Systems:  As stated in the HPI and otherwise negative.   BP 122/60   Pulse 80   Ht 5\' 10"  (1.778 m)   Wt 74.8 kg   SpO2 98%   BMI 23.65 kg/m   Physical Examination: General: Well developed, well nourished, NAD  HEENT: OP clear, mucus membranes moist  SKIN: warm, dry. No rashes. Neuro: No focal deficits  Musculoskeletal: Muscle strength 5/5 all ext  Psychiatric: Mood and affect normal  Neck: No JVD, no carotid bruits, no thyromegaly, no lymphadenopathy.  Lungs:Clear bilaterally, no wheezes, rhonci, crackles Cardiovascular: Regular rate and rhythm. No murmurs, gallops or rubs. Abdomen:Soft. Bowel sounds present. Non-tender.  Extremities: No lower extremity edema. Pulses are 2 + in the bilateral DP/PT.  Echo July 2022:  1. Left ventricular ejection fraction, by estimation, is 45 to 50%. The  left ventricle has mildly decreased function. Left ventricular endocardial  border not optimally defined to evaluate regional wall motion. There is  severe asymmetric left ventricular    hypertrophy of the septal segment. Left ventricular diastolic parameters  are indeterminate.   2. Right ventricular systolic function is normal. The right ventricular  size is normal. Tricuspid regurgitation signal is inadequate for assessing  PA pressure.   3. Left atrial size was mildly dilated.   4. The mitral valve is abnormal. Moderate mitral valve regurgitation. No  evidence of mitral stenosis.   5. Tricuspid valve regurgitation is mild to moderate.   6. The aortic valve is tricuspid. There is mild calcification of the  aortic valve. There is mild thickening of the aortic valve.   7. Aortic dilatation noted. There is mild dilatation of the aortic root,  measuring 40 mm.  EKG:  EKG is not ordered today. The ekg ordered today demonstrates   Recent Labs: 06/26/2021: Hemoglobin 10.6; Platelets 206.0 05/04/2022: ALT 12; BUN 22; Creatinine, Ser 1.29; Potassium 4.7; Sodium 140; TSH 3.360   Lipid Panel    Component Value Date/Time   CHOL 84 06/26/2021 1609   CHOL 98 (L) 12/01/2018 0808   TRIG 173.0 (H) 06/26/2021 1609   TRIG 102 10/22/2005 0730   HDL 34.50 (L) 06/26/2021 1609   HDL 41 12/01/2018 0808   CHOLHDL 2 06/26/2021 1609   VLDL 34.6 06/26/2021 1609   LDLCALC 15 06/26/2021 1609   LDLCALC 44 07/17/2019 1022     Wt Readings from Last 3 Encounters:  06/06/22 74.8 kg  05/04/22 75.4 kg  09/27/21 70.3 kg    Assessment and Plan:   1. CAD without angina: No chest pain suggestive of angina. Continue ASA and statin.   2. Cardiomyopathy, ischemic: LVEF=45-50% by echo July 2022. He has not been on a beta blocker due to intolerance and has not been on an ARB due to renal insufficiency.   3. Hyperlipidemia: LDL at goal in June 2023. Will continue statin.    4. LBBB: Chronic.   5. HTN: BP is controlled today but has been elevated at home. (150-160 systolic). Will add Norvasc 5  mg daily.   6. Chronic systolic CHF: No volume overload. Weight is stable. Continue using Lasix as  needed.   7. Sinus node dysfunction: Pacemaker in place  8. Mitral regurgitation: Moderate by echo in 2022. No loud murmur on exam. Will repeat echo in one year.    Labs/ tests ordered today include:  No orders of the defined types were placed in this encounter.  Disposition:   F/U with me in 12 months  Signed, Verne Carrow, MD 06/06/2022 11:33 AM    [facility] [address] [place], [place], [place]  [address] Phone: [phone]; Fax: [phone]

## 2022-06-08 ENCOUNTER — Ambulatory Visit (INDEPENDENT_AMBULATORY_CARE_PROVIDER_SITE_OTHER): Payer: Medicare HMO

## 2022-06-08 DIAGNOSIS — I255 Ischemic cardiomyopathy: Secondary | ICD-10-CM | POA: Diagnosis not present

## 2022-06-08 LAB — CUP PACEART REMOTE DEVICE CHECK
Battery Remaining Longevity: 160 mo
Battery Voltage: 3.06 V
Brady Statistic AP VP Percent: 0.04 %
Brady Statistic AP VS Percent: 12.92 %
Brady Statistic AS VP Percent: 0.06 %
Brady Statistic AS VS Percent: 86.97 %
Brady Statistic RA Percent Paced: 13.47 %
Brady Statistic RV Percent Paced: 0.1 %
Date Time Interrogation Session: 20240530205144
Implantable Lead Connection Status: 753985
Implantable Lead Connection Status: 753985
Implantable Lead Implant Date: 20221130
Implantable Lead Implant Date: 20221130
Implantable Lead Location: 753859
Implantable Lead Location: 753860
Implantable Lead Model: 5076
Implantable Lead Model: 5076
Implantable Pulse Generator Implant Date: 20221130
Lead Channel Impedance Value: 285 Ohm
Lead Channel Impedance Value: 380 Ohm
Lead Channel Impedance Value: 418 Ohm
Lead Channel Impedance Value: 589 Ohm
Lead Channel Pacing Threshold Amplitude: 0.625 V
Lead Channel Pacing Threshold Amplitude: 0.625 V
Lead Channel Pacing Threshold Pulse Width: 0.4 ms
Lead Channel Pacing Threshold Pulse Width: 0.4 ms
Lead Channel Sensing Intrinsic Amplitude: 1.5 mV
Lead Channel Sensing Intrinsic Amplitude: 1.5 mV
Lead Channel Sensing Intrinsic Amplitude: 8.5 mV
Lead Channel Sensing Intrinsic Amplitude: 8.5 mV
Lead Channel Setting Pacing Amplitude: 1.5 V
Lead Channel Setting Pacing Amplitude: 2 V
Lead Channel Setting Pacing Pulse Width: 0.4 ms
Lead Channel Setting Sensing Sensitivity: 0.9 mV
Zone Setting Status: 755011
Zone Setting Status: 755011

## 2022-06-13 ENCOUNTER — Encounter: Payer: Self-pay | Admitting: Family Medicine

## 2022-06-13 ENCOUNTER — Ambulatory Visit (INDEPENDENT_AMBULATORY_CARE_PROVIDER_SITE_OTHER): Payer: Medicare HMO

## 2022-06-13 ENCOUNTER — Ambulatory Visit (INDEPENDENT_AMBULATORY_CARE_PROVIDER_SITE_OTHER): Payer: Medicare HMO | Admitting: Family Medicine

## 2022-06-13 VITALS — BP 120/62 | HR 75 | Temp 97.6°F | Ht 70.0 in | Wt 167.0 lb

## 2022-06-13 DIAGNOSIS — M47812 Spondylosis without myelopathy or radiculopathy, cervical region: Secondary | ICD-10-CM | POA: Diagnosis not present

## 2022-06-13 DIAGNOSIS — M542 Cervicalgia: Secondary | ICD-10-CM | POA: Diagnosis not present

## 2022-06-13 MED ORDER — DICLOFENAC SODIUM 1 % EX GEL: 2.0000 g | Freq: Four times a day (QID) | CUTANEOUS | 0 refills | Status: DC

## 2022-06-13 NOTE — Progress Notes (Signed)
Subjective:     Patient ID: Philip Kerr, male    DOB: Dec 17, 1933, 89 y.o.   MRN: [id number]  Chief Complaint  Patient presents with   Shoulder Pain    Neck radiating to left shoulder pain for a couple months    HPI  Discussed the use of AI scribe software for clinical note transcription with the patient, who gave verbal consent to proceed.  History of Present Illness         C/o a 2 month hx of posterior neck pain. Currently on left side. No radicular symptoms.   No hx of neck surgery or injury.   Sleeping well.     Health Maintenance Due  Topic Date Due   OPHTHALMOLOGY EXAM  Never done   Zoster Vaccines- Shingrix (1 of 2) Never done   Pneumonia Vaccine 42+ Years old (2 of 2 - PPSV23 or PCV20) 07/16/2020   FOOT EXAM  11/03/2021   HEMOGLOBIN A1C  12/26/2021   Medicare Annual Wellness (AWV)  08/03/2022    Past Medical History:  Diagnosis Date   Aortic insufficiency    Coronary artery disease    coronary artery bypass graft x 5 in 1996   Diabetes mellitus    Hypercholesterolemia    non-insulin dependent   Hypertension    Ischemic cardiomyopathy    Mitral regurgitation    PVC (premature ventricular contraction)    Sinus pause 12/07/2020   s/p PPM    Past Surgical History:  Procedure Laterality Date   CORONARY ARTERY BYPASS GRAFT     LUNG SURGERY     OTHER SURGICAL HISTORY     percutaneous coronary intervention of the  posterolateral segment on 01/27/1998   PACEMAKER IMPLANT N/A 12/07/2020   Procedure: PACEMAKER IMPLANT;  Surgeon: Regan Lemming, MD;  Location: [facility] INVASIVE CV LAB;  Service: Cardiovascular;  Laterality: N/A;    Family History  Adopted: Yes  Problem Relation Age of Onset   CAD Neg Hx     Social History   Socioeconomic History   Marital status: Married    Spouse name: Not on file   Number of children: 1   Years of education: Not on file   Highest education level: Not on file  Occupational History   Occupation: Runs a  Science writer: SELF-EMPLOYED  Tobacco Use   Smoking status: Never   Smokeless tobacco: Never  Vaping Use   Vaping Use: Never used  Substance and Sexual Activity   Alcohol use: No   Drug use: No   Sexual activity: Not Currently  Other Topics Concern   Not on file  Social History Narrative   Not on file   Social Determinants of Health   Financial Resource Strain: Low Risk  (08/02/2021)   Overall Financial Resource Strain (CARDIA)    Difficulty of Paying Living Expenses: Not hard at all  Food Insecurity: No Food Insecurity (08/02/2021)   Hunger Vital Sign    Worried About Running Out of Food in the Last Year: Never true    Ran Out of Food in the Last Year: Never true  Transportation Needs: No Transportation Needs (08/02/2021)   PRAPARE - Administrator, Civil Service (Medical): No    Lack of Transportation (Non-Medical): No  Physical Activity: Sufficiently Active (08/02/2021)   Exercise Vital Sign    Days of Exercise per Week: 5 days    Minutes of Exercise per Session: 30 min  Stress: No Stress  Concern Present (08/02/2021)   [employer] of Occupational Health - Occupational Stress Questionnaire    Feeling of Stress : Not at all  Social Connections: Socially Integrated (08/02/2021)   Social Connection and Isolation Panel [NHANES]    Frequency of Communication with Friends and Family: More than three times a week    Frequency of Social Gatherings with Friends and Family: More than three times a week    Attends Religious Services: More than 4 times per year    Active Member of [employer] or Organizations: Yes    Attends Engineer, structural: More than 4 times per year    Marital Status: Married  Catering manager Violence: Not At Risk (08/02/2021)   Humiliation, Afraid, Rape, and Kick questionnaire    Fear of Current or Ex-Partner: No    Emotionally Abused: No    Physically Abused: No    Sexually Abused: No    Outpatient Medications Prior to  Visit  Medication Sig Dispense Refill   acetaminophen (TYLENOL) 325 MG tablet Take 650 mg by mouth every 6 (six) hours as needed for moderate pain or headache.     amiodarone (PACERONE) 200 MG tablet Take 1 tablet (200 mg total) by mouth daily. 90 tablet 3   amLODipine (NORVASC) 5 MG tablet Take 1 tablet (5 mg total) by mouth daily. 90 tablet 3   Ascorbic Acid (VITAMIN C) 1000 MG tablet Take 1,000 mg by mouth daily.     aspirin EC 81 MG tablet Take 1 tablet (81 mg total) by mouth daily. Swallow whole. 30 tablet 11   bisacodyl (DULCOLAX) 10 MG suppository Place 10 mg rectally as needed for moderate constipation.     Cholecalciferol (VITAMIN D3) 10 MCG (400 UNIT) tablet Take 400 Units by mouth daily.     Cinnamon 500 MG capsule Take 1,000 mg by mouth daily.     diphenhydramine-acetaminophen (TYLENOL PM) 25-500 MG TABS tablet Take 2 tablets by mouth at bedtime.     docusate sodium (COLACE) 250 MG capsule Take 250 mg by mouth daily as needed for mild constipation.     doxazosin (CARDURA) 2 MG tablet TAKE 1 TABLET (2 MG TOTAL) BY MOUTH AT BEDTIME. 90 tablet 2   ondansetron (ZOFRAN) 4 MG tablet Take 1 tablet (4 mg total) by mouth every 8 (eight) hours as needed for nausea or vomiting. 20 tablet 0   oxybutynin (DITROPAN) 5 MG tablet TAKE 1 TABLET EVERY 8 HOURS AS NEEDED FOR UP TO 14 DAYS FOR BLADDER SPASMS. 270 tablet 2   rosuvastatin (CRESTOR) 20 MG tablet TAKE 1 TABLET BY MOUTH EVERY DAY 90 tablet 1   tamsulosin (FLOMAX) 0.4 MG CAPS capsule TAKE 1 CAPSULE BY MOUTH EVERY DAY AFTER SUPPER 90 capsule 3   traZODone (DESYREL) 50 MG tablet TAKE 1/2 TO 1 TABLET BY MOUTH AT BEDTIME AS NEEDED FOR SLEEP 90 tablet 0   vitamin E 180 MG (400 UNITS) capsule Take 400 Units by mouth daily.     furosemide (LASIX) 20 MG tablet Take 1 tablet (20 mg total) by mouth as needed. (Patient taking differently: Take 20 mg by mouth as needed for fluid or edema.) 30 tablet 2   benzonatate (TESSALON) 200 MG capsule Take 1 capsule  (200 mg total) by mouth 2 (two) times daily as needed for cough. (Patient not taking: Reported on 06/13/2022) 20 capsule 0   No facility-administered medications prior to visit.    No Known Allergies  Review of Systems  Constitutional:  Negative  for chills and fever.  Respiratory:  Negative for shortness of breath.   Cardiovascular:  Negative for chest pain, palpitations and leg swelling.  Gastrointestinal:  Negative for abdominal pain, constipation, diarrhea, nausea and vomiting.  Musculoskeletal:  Positive for neck pain.  Neurological:  Negative for dizziness, tingling, sensory change, focal weakness and headaches.       Objective:    Physical Exam Constitutional:      General: He is not in acute distress.    Appearance: He is not ill-appearing.  HENT:     Head: Atraumatic.     Mouth/Throat:     Mouth: Mucous membranes are moist.  Eyes:     Extraocular Movements: Extraocular movements intact.     Conjunctiva/sclera: Conjunctivae normal.  Cardiovascular:     Rate and Rhythm: Normal rate and regular rhythm.  Pulmonary:     Effort: Pulmonary effort is normal.     Breath sounds: Normal breath sounds.  Musculoskeletal:     Cervical back: Neck supple. No tenderness. Pain with movement present.  Lymphadenopathy:     Cervical: No cervical adenopathy.  Skin:    General: Skin is warm and dry.  Neurological:     General: No focal deficit present.     Mental Status: He is alert and oriented to person, place, and time.  Psychiatric:        Mood and Affect: Mood normal.        Behavior: Behavior normal.        Thought Content: Thought content normal.      BP 120/62 (BP Location: Left Arm, Patient Position: Sitting, Cuff Size: Large)   Pulse 75   Temp 97.6 F (36.4 C) (Temporal)   Ht 5\' 10"  (1.778 m)   Wt 167 lb (75.8 kg)   SpO2 97%   BMI 23.96 kg/m  Wt Readings from Last 3 Encounters:  06/13/22 167 lb (75.8 kg)  06/06/22 164 lb 12.8 oz (74.8 kg)  05/04/22 166 lb 3.2  oz (75.4 kg)       Assessment & Plan:   Problem List Items Addressed This Visit   None Visit Diagnoses     Posterior neck pain    -  Primary   Relevant Medications   diclofenac Sodium (VOLTAREN) 1 % GEL   Other Relevant Orders   DG Cervical Spine Complete     No red flag symptoms.  No acute infectious process.  Cervical X ray ordered.  Discussed conservative treatment.  Follow up pending results and with ortho as needed.   I have discontinued Homero Fellers B. Piasecki's benzonatate. I am also having him start on diclofenac Sodium. Additionally, I am having him maintain his Vitamin D3, vitamin E, furosemide, vitamin C, docusate sodium, Cinnamon, aspirin EC, ondansetron, amiodarone, diphenhydramine-acetaminophen, bisacodyl, acetaminophen, oxybutynin, tamsulosin, doxazosin, rosuvastatin, traZODone, and amLODipine.  Meds ordered this encounter  Medications   diclofenac Sodium (VOLTAREN) 1 % GEL    Sig: Apply 2 g topically 4 (four) times daily.    Dispense:  150 g    Refill:  0    Order Specific Question:   Supervising Provider    Answer:   Hillard Danker A [[id number]]

## 2022-06-13 NOTE — Patient Instructions (Signed)
Please go downstairs for an X ray of your neck.   Use heat or ice.   Use topical Voltaren gel.   Take Tylenol.   We will be in touch with your results and recommendations.

## 2022-06-19 ENCOUNTER — Telehealth: Payer: Self-pay | Admitting: Internal Medicine

## 2022-06-19 NOTE — Telephone Encounter (Signed)
Pt called

## 2022-06-19 NOTE — Telephone Encounter (Signed)
ERROR

## 2022-06-21 ENCOUNTER — Telehealth: Payer: Self-pay | Admitting: Internal Medicine

## 2022-06-21 NOTE — Progress Notes (Signed)
Please let him know that I just received his x-ray results.  He has moderate degenerative changes of his cervical spine.  This is wear and tear over time.  If he is still having issues, he can follow-up with his orthopedist or his primary care but good news that nothing acute was seen.

## 2022-06-21 NOTE — Telephone Encounter (Signed)
Patient's wife called about x-ray results. They would like a call back at [phone].

## 2022-06-25 NOTE — Telephone Encounter (Signed)
Called and left results on VM

## 2022-06-26 ENCOUNTER — Encounter: Payer: Self-pay | Admitting: Internal Medicine

## 2022-06-26 ENCOUNTER — Ambulatory Visit (INDEPENDENT_AMBULATORY_CARE_PROVIDER_SITE_OTHER): Payer: Medicare HMO | Admitting: Internal Medicine

## 2022-06-26 VITALS — BP 130/80 | HR 68 | Temp 97.7°F | Ht 70.0 in | Wt 165.0 lb

## 2022-06-26 DIAGNOSIS — J189 Pneumonia, unspecified organism: Secondary | ICD-10-CM

## 2022-06-26 MED ORDER — AMOXICILLIN-POT CLAVULANATE 875-125 MG PO TABS: 1.0000 | ORAL_TABLET | Freq: Two times a day (BID) | ORAL | 0 refills | Status: AC

## 2022-06-26 NOTE — Assessment & Plan Note (Signed)
Some chronic changes on his last x-ray which may account for the poor air flow on exam. However given clinical status with 3 day onset cough with sputum, chills, and wheezing will treat with 5 day augmentin course. If no improvement or worsening they will call back.

## 2022-06-26 NOTE — Progress Notes (Signed)
   Subjective:   Patient ID: Philip Kerr, male    DOB: 1933/08/15, 89 y.o.   MRN: [id number]  Cough Associated symptoms include chills, myalgias, postnasal drip, rhinorrhea and wheezing. Pertinent negatives include no ear pain, fever, sore throat or shortness of breath.   The patient is an 89 YO man coming in for new cough 3 days and chills.   Review of Systems  Constitutional:  Positive for activity change and chills. Negative for appetite change, fatigue, fever and unexpected weight change.  HENT:  Positive for congestion, postnasal drip, rhinorrhea and sinus pressure. Negative for ear discharge, ear pain, sinus pain, sneezing, sore throat, tinnitus, trouble swallowing and voice change.   Eyes: Negative.   Respiratory:  Positive for cough and wheezing. Negative for chest tightness and shortness of breath.   Cardiovascular: Negative.   Gastrointestinal: Negative.   Musculoskeletal:  Positive for myalgias.  Neurological: Negative.     Objective:  Physical Exam Constitutional:      Appearance: He is well-developed.  HENT:     Head: Normocephalic and atraumatic.     Comments: Oropharynx with redness and clear drainage, nose with swollen turbinates, TMs normal bilaterally.  Neck:     Thyroid: No thyromegaly.  Cardiovascular:     Rate and Rhythm: Normal rate and regular rhythm.  Pulmonary:     Effort: Pulmonary effort is normal. No respiratory distress.     Breath sounds: Rhonchi present. No wheezing or rales.     Comments: Poor air flow Abdominal:     Palpations: Abdomen is soft.  Musculoskeletal:        General: Tenderness present.     Cervical back: Normal range of motion.  Lymphadenopathy:     Cervical: No cervical adenopathy.  Skin:    General: Skin is warm and dry.  Neurological:     Mental Status: He is alert and oriented to person, place, and time.     Vitals:   06/26/22 1314  BP: 130/80  Pulse: 68  Temp: 97.7 F (36.5 C)  TempSrc: Oral  SpO2: 96%   Weight: 165 lb (74.8 kg)  Height: 5\' 10"  (1.778 m)    Assessment & Plan:

## 2022-06-26 NOTE — Patient Instructions (Signed)
We have sent in augmentin to take 1 pill twice a day for 5 days.    

## 2022-06-26 NOTE — Progress Notes (Signed)
Remote pacemaker transmission.   

## 2022-06-30 ENCOUNTER — Other Ambulatory Visit: Payer: Self-pay | Admitting: Internal Medicine

## 2022-07-06 DIAGNOSIS — R338 Other retention of urine: Secondary | ICD-10-CM | POA: Diagnosis not present

## 2022-07-19 ENCOUNTER — Other Ambulatory Visit: Payer: Self-pay | Admitting: Internal Medicine

## 2022-07-29 ENCOUNTER — Other Ambulatory Visit: Payer: Self-pay | Admitting: Internal Medicine

## 2022-08-06 DIAGNOSIS — R338 Other retention of urine: Secondary | ICD-10-CM | POA: Diagnosis not present

## 2022-08-09 ENCOUNTER — Other Ambulatory Visit: Payer: Self-pay | Admitting: Internal Medicine

## 2022-08-24 ENCOUNTER — Other Ambulatory Visit (HOSPITAL_BASED_OUTPATIENT_CLINIC_OR_DEPARTMENT_OTHER): Payer: Self-pay | Admitting: Cardiovascular Disease

## 2022-08-24 ENCOUNTER — Other Ambulatory Visit: Payer: Self-pay | Admitting: Internal Medicine

## 2022-08-24 DIAGNOSIS — R000 Tachycardia, unspecified: Secondary | ICD-10-CM

## 2022-08-24 DIAGNOSIS — Z79899 Other long term (current) drug therapy: Secondary | ICD-10-CM

## 2022-08-24 DIAGNOSIS — I4729 Other ventricular tachycardia: Secondary | ICD-10-CM

## 2022-09-07 ENCOUNTER — Ambulatory Visit (INDEPENDENT_AMBULATORY_CARE_PROVIDER_SITE_OTHER): Payer: Medicare HMO

## 2022-09-07 DIAGNOSIS — I495 Sick sinus syndrome: Secondary | ICD-10-CM

## 2022-09-07 LAB — CUP PACEART REMOTE DEVICE CHECK
Battery Remaining Longevity: 157 mo
Battery Voltage: 3.05 V
Brady Statistic AP VP Percent: 0.08 %
Brady Statistic AP VS Percent: 17.96 %
Brady Statistic AS VP Percent: 0.1 %
Brady Statistic AS VS Percent: 81.87 %
Brady Statistic RA Percent Paced: 19.12 %
Brady Statistic RV Percent Paced: 0.18 %
Date Time Interrogation Session: 20240830063332
Implantable Lead Connection Status: 753985
Implantable Lead Connection Status: 753985
Implantable Lead Implant Date: 20221130
Implantable Lead Implant Date: 20221130
Implantable Lead Location: 753859
Implantable Lead Location: 753860
Implantable Lead Model: 5076
Implantable Lead Model: 5076
Implantable Pulse Generator Implant Date: 20221130
Lead Channel Impedance Value: 266 Ohm
Lead Channel Impedance Value: 361 Ohm
Lead Channel Impedance Value: 380 Ohm
Lead Channel Impedance Value: 589 Ohm
Lead Channel Pacing Threshold Amplitude: 0.625 V
Lead Channel Pacing Threshold Amplitude: 0.75 V
Lead Channel Pacing Threshold Pulse Width: 0.4 ms
Lead Channel Pacing Threshold Pulse Width: 0.4 ms
Lead Channel Sensing Intrinsic Amplitude: 1.5 mV
Lead Channel Sensing Intrinsic Amplitude: 1.5 mV
Lead Channel Sensing Intrinsic Amplitude: 8.125 mV
Lead Channel Sensing Intrinsic Amplitude: 8.125 mV
Lead Channel Setting Pacing Amplitude: 1.5 V
Lead Channel Setting Pacing Amplitude: 2 V
Lead Channel Setting Pacing Pulse Width: 0.4 ms
Lead Channel Setting Sensing Sensitivity: 0.9 mV
Zone Setting Status: 755011
Zone Setting Status: 755011

## 2022-09-11 NOTE — Progress Notes (Signed)
Remote pacemaker transmission.   

## 2022-09-13 DIAGNOSIS — R338 Other retention of urine: Secondary | ICD-10-CM | POA: Diagnosis not present

## 2022-09-13 DIAGNOSIS — N401 Enlarged prostate with lower urinary tract symptoms: Secondary | ICD-10-CM | POA: Diagnosis not present

## 2022-09-13 DIAGNOSIS — N281 Cyst of kidney, acquired: Secondary | ICD-10-CM | POA: Diagnosis not present

## 2022-09-13 DIAGNOSIS — N323 Diverticulum of bladder: Secondary | ICD-10-CM | POA: Diagnosis not present

## 2022-09-19 ENCOUNTER — Telehealth: Payer: Self-pay | Admitting: Cardiology

## 2022-09-19 NOTE — Telephone Encounter (Signed)
Returned call.  LM on VM explaining that assuming this is for remote device, I gave them [facility] tech services number to call and troubleshoot.  If any further issues or concerns, they were also given our device clinic number to call back tomorrow if needed as it is after hours now.

## 2022-09-19 NOTE — Telephone Encounter (Signed)
Patient's wife is calling because the patient's device will change colors from green to red. Patient's wife would like to know what it means when the light is red. Please advise.

## 2022-09-21 ENCOUNTER — Other Ambulatory Visit: Payer: Self-pay | Admitting: Internal Medicine

## 2022-09-24 ENCOUNTER — Other Ambulatory Visit: Payer: Self-pay

## 2022-10-09 DIAGNOSIS — R338 Other retention of urine: Secondary | ICD-10-CM | POA: Diagnosis not present

## 2022-10-12 ENCOUNTER — Ambulatory Visit (INDEPENDENT_AMBULATORY_CARE_PROVIDER_SITE_OTHER): Payer: Medicare HMO

## 2022-10-12 ENCOUNTER — Ambulatory Visit (INDEPENDENT_AMBULATORY_CARE_PROVIDER_SITE_OTHER): Payer: Medicare HMO | Admitting: Internal Medicine

## 2022-10-12 ENCOUNTER — Encounter: Payer: Self-pay | Admitting: Internal Medicine

## 2022-10-12 VITALS — BP 126/80 | HR 95 | Temp 97.9°F | Ht 70.0 in | Wt 174.0 lb

## 2022-10-12 DIAGNOSIS — I10 Essential (primary) hypertension: Secondary | ICD-10-CM

## 2022-10-12 DIAGNOSIS — R0602 Shortness of breath: Secondary | ICD-10-CM | POA: Diagnosis not present

## 2022-10-12 DIAGNOSIS — E559 Vitamin D deficiency, unspecified: Secondary | ICD-10-CM

## 2022-10-12 DIAGNOSIS — R062 Wheezing: Secondary | ICD-10-CM | POA: Diagnosis not present

## 2022-10-12 DIAGNOSIS — E119 Type 2 diabetes mellitus without complications: Secondary | ICD-10-CM | POA: Diagnosis not present

## 2022-10-12 DIAGNOSIS — R051 Acute cough: Secondary | ICD-10-CM

## 2022-10-12 DIAGNOSIS — N1831 Chronic kidney disease, stage 3a: Secondary | ICD-10-CM | POA: Diagnosis not present

## 2022-10-12 DIAGNOSIS — R059 Cough, unspecified: Secondary | ICD-10-CM | POA: Insufficient documentation

## 2022-10-12 DIAGNOSIS — J984 Other disorders of lung: Secondary | ICD-10-CM | POA: Diagnosis not present

## 2022-10-12 DIAGNOSIS — J479 Bronchiectasis, uncomplicated: Secondary | ICD-10-CM | POA: Diagnosis not present

## 2022-10-12 DIAGNOSIS — E538 Deficiency of other specified B group vitamins: Secondary | ICD-10-CM

## 2022-10-12 DIAGNOSIS — R918 Other nonspecific abnormal finding of lung field: Secondary | ICD-10-CM | POA: Diagnosis not present

## 2022-10-12 LAB — URINALYSIS, ROUTINE W REFLEX MICROSCOPIC
Bilirubin Urine: NEGATIVE
Ketones, ur: NEGATIVE
Nitrite: POSITIVE — AB
Specific Gravity, Urine: 1.025 (ref 1.000–1.030)
Total Protein, Urine: 100 — AB
Urine Glucose: NEGATIVE
Urobilinogen, UA: 0.2 (ref 0.0–1.0)
pH: 6 (ref 5.0–8.0)

## 2022-10-12 LAB — BASIC METABOLIC PANEL
BUN: 19 mg/dL (ref 6–23)
CO2: 29 meq/L (ref 19–32)
Calcium: 8.9 mg/dL (ref 8.4–10.5)
Chloride: 105 meq/L (ref 96–112)
Creatinine, Ser: 1.34 mg/dL (ref 0.40–1.50)
GFR: 47.79 mL/min — ABNORMAL LOW (ref >60.00)
Glucose, Bld: 128 mg/dL — ABNORMAL HIGH (ref 70–99)
Potassium: 4.4 meq/L (ref 3.5–5.1)
Sodium: 140 meq/L (ref 135–145)

## 2022-10-12 LAB — LIPID PANEL
Cholesterol: 86 mg/dL (ref 0–200)
HDL: 53.3 mg/dL (ref >39.00)
LDL Cholesterol: 18 mg/dL (ref 0–99)
NonHDL: 32.2
Total CHOL/HDL Ratio: 2
Triglycerides: 69 mg/dL (ref 0.0–149.0)
VLDL: 13.8 mg/dL (ref 0.0–40.0)

## 2022-10-12 LAB — CBC WITH DIFFERENTIAL/PLATELET
Basophils Absolute: 0.1 10*3/uL (ref 0.0–0.1)
Basophils Relative: 0.6 % (ref 0.0–3.0)
Eosinophils Absolute: 0.2 10*3/uL (ref 0.0–0.7)
Eosinophils Relative: 2.6 % (ref 0.0–5.0)
HCT: 36.4 % — ABNORMAL LOW (ref 39.0–52.0)
Hemoglobin: 11.7 g/dL — ABNORMAL LOW (ref 13.0–17.0)
Lymphocytes Relative: 34.2 % (ref 12.0–46.0)
Lymphs Abs: 3 10*3/uL (ref 0.7–4.0)
MCHC: 32.2 g/dL (ref 30.0–36.0)
MCV: 100.6 fL — ABNORMAL HIGH (ref 78.0–100.0)
Monocytes Absolute: 0.5 10*3/uL (ref 0.1–1.0)
Monocytes Relative: 6.1 % (ref 3.0–12.0)
Neutro Abs: 5 10*3/uL (ref 1.4–7.7)
Neutrophils Relative %: 56.5 % (ref 43.0–77.0)
Platelets: 221 10*3/uL (ref 150.0–400.0)
RBC: 3.62 Mil/uL — ABNORMAL LOW (ref 4.22–5.81)
RDW: 15.4 % (ref 11.5–15.5)
WBC: 8.9 10*3/uL (ref 4.0–10.5)

## 2022-10-12 LAB — HEPATIC FUNCTION PANEL
ALT: 15 U/L (ref 0–53)
AST: 21 U/L (ref 0–37)
Albumin: 3.8 g/dL (ref 3.5–5.2)
Alkaline Phosphatase: 63 U/L (ref 39–117)
Bilirubin, Direct: 0.1 mg/dL (ref 0.0–0.3)
Total Bilirubin: 0.4 mg/dL (ref 0.2–1.2)
Total Protein: 7.3 g/dL (ref 6.0–8.3)

## 2022-10-12 LAB — MICROALBUMIN / CREATININE URINE RATIO
Creatinine,U: 91.6 mg/dL
Microalb Creat Ratio: 35.1 mg/g — ABNORMAL HIGH (ref 0.0–30.0)
Microalb, Ur: 32.2 mg/dL — ABNORMAL HIGH (ref 0.0–1.9)

## 2022-10-12 LAB — TSH: TSH: 1.97 u[IU]/mL (ref 0.35–5.50)

## 2022-10-12 LAB — VITAMIN B12: Vitamin B-12: 1372 pg/mL — ABNORMAL HIGH (ref 211–911)

## 2022-10-12 LAB — HEMOGLOBIN A1C: Hgb A1c MFr Bld: 6.4 % (ref 4.6–6.5)

## 2022-10-12 LAB — VITAMIN D 25 HYDROXY (VIT D DEFICIENCY, FRACTURES): VITD: 61.75 ng/mL (ref 30.00–100.00)

## 2022-10-12 MED ORDER — METHYLPREDNISOLONE ACETATE 80 MG/ML IJ SUSP
80.0000 mg | Freq: Once | INTRAMUSCULAR | Status: AC
Start: 2022-10-12 — End: 2022-10-12
  Administered 2022-10-12: 80 mg via INTRAMUSCULAR

## 2022-10-12 MED ORDER — LEVOFLOXACIN 500 MG PO TABS: 500.0000 mg | ORAL_TABLET | Freq: Every day | ORAL | 0 refills | Status: AC

## 2022-10-12 MED ORDER — PREDNISONE 10 MG PO TABS: ORAL_TABLET | ORAL | 0 refills | Status: DC

## 2022-10-12 MED ORDER — HYDROCODONE BIT-HOMATROP MBR 5-1.5 MG/5ML PO SOLN: 5.0000 mL | Freq: Four times a day (QID) | ORAL | 0 refills | Status: AC | PRN

## 2022-10-12 MED ORDER — ALBUTEROL SULFATE HFA 108 (90 BASE) MCG/ACT IN AERS: 2.0000 | INHALATION_SPRAY | Freq: Four times a day (QID) | RESPIRATORY_TRACT | 0 refills | Status: DC | PRN

## 2022-10-12 NOTE — Progress Notes (Signed)
The test results show that your current treatment is OK, as the tests are stable.  Please continue the same plan.  There is no other need for change of treatment or further evaluation based on these results, at this time.  thanks 

## 2022-10-12 NOTE — Patient Instructions (Addendum)
You had the steroid shot today  Please take all new medication as prescribed  - the antibiotic, cough medicine, inhaler, and prednisone  Please continue all other medications as before, and refills have been done if requested.  Please have the pharmacy call with any other refills you may need.  Please continue your efforts at being more active, low cholesterol diet, and weight control.  Please keep your appointments with your specialists as you may have planned  Please go to the XRAY Department in the first floor for the x-ray testing  Please go to the LAB at the blood drawing area for the tests to be done  You will be contacted by phone if any changes need to be made immediately.  Otherwise, you will receive a letter about your results with an explanation, but please check with MyChart first.  Please make an Appointment to return in 4 months, or sooner if needed

## 2022-10-12 NOTE — Progress Notes (Unsigned)
Patient ID: Philip Kerr, male   DOB: [birth year], 89 y.o.   MRN: [id number]        Chief Complaint: follow up cough, wheezing, dm, low vit d       HPI:  Philip Kerr is a 89 y.o. male Here with acute onset mild to mod 2-3 days ST, HA, general weakness and malaise, with prod cough greenish sputum, but Pt denies chest pain, increased sob or doe, wheezing, orthopnea, PND, increased LE swelling, palpitations, dizziness or syncopel except for mild wheezing last 2days   Pt denies polydipsia, polyuria, or new focal neuro s/s.    Pt denies wt loss, night sweats, loss of appetite, or other constitutional symptoms        Wt Readings from Last 3 Encounters:  10/12/22 174 lb (78.9 kg)  06/26/22 165 lb (74.8 kg)  06/13/22 167 lb (75.8 kg)   BP Readings from Last 3 Encounters:  10/12/22 126/80  06/26/22 130/80  06/13/22 120/62         Past Medical History:  Diagnosis Date   Aortic insufficiency    Coronary artery disease    coronary artery bypass graft x 5 in 1996   Diabetes mellitus    Hypercholesterolemia    non-insulin dependent   Hypertension    Ischemic cardiomyopathy    Mitral regurgitation    PVC (premature ventricular contraction)    Sinus pause 12/07/2020   s/p PPM   Past Surgical History:  Procedure Laterality Date   CORONARY ARTERY BYPASS GRAFT     LUNG SURGERY     OTHER SURGICAL HISTORY     percutaneous coronary intervention of the  posterolateral segment on 01/27/1998   PACEMAKER IMPLANT N/A 12/07/2020   Procedure: PACEMAKER IMPLANT;  Surgeon: Regan Lemming, MD;  Location: [facility];  Service: Cardiovascular;  Laterality: N/A;    reports that he has never smoked. He has never used smokeless tobacco. He reports that he does not drink alcohol and does not use drugs. family history is not on file. He was adopted. No Known Allergies Current Outpatient Medications on File Prior to Visit  Medication Sig Dispense Refill   acetaminophen (TYLENOL) 325 MG  tablet Take 650 mg by mouth every 6 (six) hours as needed for moderate pain or headache.     amiodarone (PACERONE) 200 MG tablet Take 1 tablet (200 mg total) by mouth daily. 90 tablet 3   amLODipine (NORVASC) 5 MG tablet Take 1 tablet (5 mg total) by mouth daily. 90 tablet 3   Ascorbic Acid (VITAMIN C) 1000 MG tablet Take 1,000 mg by mouth daily.     aspirin EC 81 MG tablet Take 1 tablet (81 mg total) by mouth daily. Swallow whole. 30 tablet 11   bisacodyl (DULCOLAX) 10 MG suppository Place 10 mg rectally as needed for moderate constipation.     Cholecalciferol (VITAMIN D3) 10 MCG (400 UNIT) tablet Take 400 Units by mouth daily.     Cinnamon 500 MG capsule Take 1,000 mg by mouth daily.     diclofenac Sodium (VOLTAREN) 1 % GEL Apply 2 g topically 4 (four) times daily. 150 g 0   diphenhydramine-acetaminophen (TYLENOL PM) 25-500 MG TABS tablet Take 2 tablets by mouth at bedtime.     docusate sodium (COLACE) 250 MG capsule Take 250 mg by mouth daily as needed for mild constipation.     doxazosin (CARDURA) 2 MG tablet TAKE 1 TABLET BY MOUTH AT BEDTIME. 90 tablet 2  ondansetron (ZOFRAN) 4 MG tablet Take 1 tablet (4 mg total) by mouth every 8 (eight) hours as needed for nausea or vomiting. 20 tablet 0   oxybutynin (DITROPAN) 5 MG tablet TAKE 1 TABLET EVERY 8 HOURS AS NEEDED FOR UP TO 14 DAYS FOR BLADDER SPASMS. 270 tablet 2   QUEtiapine (SEROQUEL) 50 MG tablet Take by mouth.     rosuvastatin (CRESTOR) 20 MG tablet TAKE 1 TABLET BY MOUTH EVERY DAY 90 tablet 2   tamsulosin (FLOMAX) 0.4 MG CAPS capsule TAKE 1 CAPSULE BY MOUTH EVERY DAY AFTER SUPPER 90 capsule 3   traZODone (DESYREL) 50 MG tablet TAKE 1/2 TO 1 TABLET BY MOUTH AT BEDTIME AS NEEDED FOR SLEEP 90 tablet 1   vitamin E 180 MG (400 UNITS) capsule Take 400 Units by mouth daily.     furosemide (LASIX) 20 MG tablet Take 1 tablet (20 mg total) by mouth as needed. (Patient taking differently: Take 20 mg by mouth as needed for fluid or edema.) 30 tablet  2   No current facility-administered medications on file prior to visit.        ROS:  All others reviewed and negative.  Objective        PE:  BP 126/80 (BP Location: Left Arm, Patient Position: Sitting, Cuff Size: Normal)   Pulse 95   Temp 97.9 F (36.6 C) (Oral)   Ht 5\' 10"  (1.778 m)   Wt 174 lb (78.9 kg)   SpO2 97%   BMI 24.97 kg/m                 Constitutional: Pt appears in NAD, mild ill               HENT: Head: NCAT.                Right Ear: External ear normal.                 Left Ear: External ear normal. Bilat tm's with mild erythema.  Max sinus areas non tender.  Pharynx with mild erythema, no exudate               Eyes: . Pupils are equal, round, and reactive to light. Conjunctivae and EOM are normal               Nose: without d/c or deformity               Neck: Neck supple. Gross normal ROM               Cardiovascular: Normal rate and regular rhythm.                 Pulmonary/Chest: Effort normal and breath sounds without rales but with few bilateral wheezing.                Abd:  Soft, NT, ND, + BS, no organomegaly               Neurological: Pt is alert. At baseline orientation, motor grossly intact               Skin: Skin is warm. No rashes, no other new lesions, LE edema - none               Psychiatric: Pt behavior is normal without agitation   Micro: none  Cardiac tracings I have personally interpreted today:  none  Pertinent Radiological findings (summarize): none   Lab Results  Component Value Date  WBC 8.9 10/12/2022   HGB 11.7 (L) 10/12/2022   HCT 36.4 (L) 10/12/2022   PLT 221.0 10/12/2022   GLUCOSE 128 (H) 10/12/2022   CHOL 86 10/12/2022   TRIG 69.0 10/12/2022   HDL 53.30 10/12/2022   LDLCALC 18 10/12/2022   ALT 15 10/12/2022   AST 21 10/12/2022   NA 140 10/12/2022   K 4.4 10/12/2022   CL 105 10/12/2022   CREATININE 1.34 10/12/2022   BUN 19 10/12/2022   CO2 29 10/12/2022   TSH 1.97 10/12/2022   PSA 1.97 09/06/2017   INR 1.2  12/07/2020   HGBA1C 6.4 10/12/2022   MICROALBUR 32.2 (H) 10/12/2022   Assessment/Plan:  STIHL BRUSSO is a 89 y.o. White or Caucasian [1] male with  has a past medical history of Aortic insufficiency, Coronary artery disease, Diabetes mellitus, Hypercholesterolemia, Hypertension, Ischemic cardiomyopathy, Mitral regurgitation, PVC (premature ventricular contraction), and Sinus pause (12/07/2020).  Diabetes mellitus type II, non insulin dependent (HCC) Lab Results  Component Value Date   HGBA1C 6.4 10/12/2022   Stable, pt to continue current medical treatment  - diet, wt control   Essential hypertension BP Readings from Last 3 Encounters:  10/12/22 126/80  06/26/22 130/80  06/13/22 120/62   Stable, pt to continue medical treatment norvasc 5 every day, cardura 2 mg hs,    Vitamin D deficiency Last vitamin D Lab Results  Component Value Date   VD25OH 61.75 10/12/2022   Stable, cont oral replacement   Chronic kidney disease, stage 3a (HCC) Lab Results  Component Value Date   CREATININE 1.34 10/12/2022   Stable overall, cont to avoid nephrotoxins   Cough Mild to mod, c/w bronchtis vs pna, for cxr, for antibx course levaquiin 500 every day,cough med prn,,  to f/u any worsening symptoms or concerns  Wheezing Mild to mod, for depomedrol IM 80 mg, prednisone taper,  to f/u any worsening symptoms or concerns  Followup: Return in about 4 months (around 02/12/2023).  Oliver Barre, MD 10/14/2022 7:57 PM [facility]

## 2022-10-14 ENCOUNTER — Encounter: Payer: Self-pay | Admitting: Internal Medicine

## 2022-10-14 NOTE — Assessment & Plan Note (Signed)
Mild to mod, for depomedrol IM 80 mg , prednisone taper,   to f/u any worsening symptoms or concerns 

## 2022-10-14 NOTE — Assessment & Plan Note (Signed)
BP Readings from Last 3 Encounters:  10/12/22 126/80  06/26/22 130/80  06/13/22 120/62   Stable, pt to continue medical treatment norvasc 5 every day, cardura 2 mg hs,

## 2022-10-14 NOTE — Assessment & Plan Note (Signed)
Mild to mod, c/w bronchtis vs pna, for cxr, for antibx course levaquiin 500 every day,cough med prn,,  to f/u any worsening symptoms or concerns

## 2022-10-14 NOTE — Assessment & Plan Note (Signed)
Lab Results  Component Value Date   HGBA1C 6.4 10/12/2022   Stable, pt to continue current medical treatment  - diet, wt control

## 2022-10-14 NOTE — Assessment & Plan Note (Signed)
Last vitamin D Lab Results  Component Value Date   VD25OH 61.75 10/12/2022   Stable, cont oral replacement

## 2022-10-14 NOTE — Assessment & Plan Note (Signed)
Lab Results  Component Value Date   CREATININE 1.34 10/12/2022   Stable overall, cont to avoid nephrotoxins

## 2022-10-17 ENCOUNTER — Emergency Department (HOSPITAL_COMMUNITY)
Admission: EM | Admit: 2022-10-17 | Discharge: 2022-10-18 | Disposition: A | Discharge status: Home / Self Care | Payer: Medicare HMO | Attending: Emergency Medicine | Admitting: Emergency Medicine

## 2022-10-17 ENCOUNTER — Telehealth: Payer: Self-pay | Admitting: Cardiovascular Disease

## 2022-10-17 ENCOUNTER — Other Ambulatory Visit: Payer: Self-pay

## 2022-10-17 ENCOUNTER — Emergency Department (HOSPITAL_COMMUNITY): Payer: Medicare HMO

## 2022-10-17 ENCOUNTER — Encounter (HOSPITAL_COMMUNITY): Payer: Self-pay | Admitting: Emergency Medicine

## 2022-10-17 DIAGNOSIS — I5023 Acute on chronic systolic (congestive) heart failure: Secondary | ICD-10-CM | POA: Diagnosis not present

## 2022-10-17 DIAGNOSIS — Z951 Presence of aortocoronary bypass graft: Secondary | ICD-10-CM | POA: Diagnosis not present

## 2022-10-17 DIAGNOSIS — R600 Localized edema: Secondary | ICD-10-CM | POA: Diagnosis not present

## 2022-10-17 DIAGNOSIS — Z7982 Long term (current) use of aspirin: Secondary | ICD-10-CM | POA: Diagnosis not present

## 2022-10-17 DIAGNOSIS — R0602 Shortness of breath: Secondary | ICD-10-CM | POA: Diagnosis not present

## 2022-10-17 DIAGNOSIS — R7989 Other specified abnormal findings of blood chemistry: Secondary | ICD-10-CM | POA: Diagnosis not present

## 2022-10-17 DIAGNOSIS — R062 Wheezing: Secondary | ICD-10-CM | POA: Diagnosis not present

## 2022-10-17 DIAGNOSIS — Z79899 Other long term (current) drug therapy: Secondary | ICD-10-CM | POA: Diagnosis not present

## 2022-10-17 DIAGNOSIS — E119 Type 2 diabetes mellitus without complications: Secondary | ICD-10-CM | POA: Diagnosis not present

## 2022-10-17 DIAGNOSIS — I2510 Atherosclerotic heart disease of native coronary artery without angina pectoris: Secondary | ICD-10-CM | POA: Insufficient documentation

## 2022-10-17 DIAGNOSIS — Z950 Presence of cardiac pacemaker: Secondary | ICD-10-CM | POA: Diagnosis not present

## 2022-10-17 DIAGNOSIS — I509 Heart failure, unspecified: Secondary | ICD-10-CM | POA: Diagnosis not present

## 2022-10-17 DIAGNOSIS — R609 Edema, unspecified: Secondary | ICD-10-CM | POA: Diagnosis present

## 2022-10-17 DIAGNOSIS — I110 Hypertensive heart disease with heart failure: Secondary | ICD-10-CM | POA: Diagnosis not present

## 2022-10-17 DIAGNOSIS — R918 Other nonspecific abnormal finding of lung field: Secondary | ICD-10-CM | POA: Diagnosis not present

## 2022-10-17 LAB — BASIC METABOLIC PANEL
Anion gap: 9 (ref 5–15)
BUN: 27 mg/dL — ABNORMAL HIGH (ref 8–23)
CO2: 24 mmol/L (ref 22–32)
Calcium: 8.8 mg/dL — ABNORMAL LOW (ref 8.9–10.3)
Chloride: 105 mmol/L (ref 98–111)
Creatinine, Ser: 1.49 mg/dL — ABNORMAL HIGH (ref 0.61–1.24)
GFR, Estimated: 45 mL/min — ABNORMAL LOW (ref >60)
Glucose, Bld: 133 mg/dL — ABNORMAL HIGH (ref 70–99)
Potassium: 4.1 mmol/L (ref 3.5–5.1)
Sodium: 138 mmol/L (ref 135–145)

## 2022-10-17 LAB — BRAIN NATRIURETIC PEPTIDE: B Natriuretic Peptide: 2431.6 pg/mL — ABNORMAL HIGH (ref 0.0–100.0)

## 2022-10-17 LAB — CBC
HCT: 36 % — ABNORMAL LOW (ref 39.0–52.0)
Hemoglobin: 11.2 g/dL — ABNORMAL LOW (ref 13.0–17.0)
MCH: 31.5 pg (ref 26.0–34.0)
MCHC: 31.1 g/dL (ref 30.0–36.0)
MCV: 101.1 fL — ABNORMAL HIGH (ref 80.0–100.0)
Platelets: 208 10*3/uL (ref 150–400)
RBC: 3.56 MIL/uL — ABNORMAL LOW (ref 4.22–5.81)
RDW: 14 % (ref 11.5–15.5)
WBC: 11.8 10*3/uL — ABNORMAL HIGH (ref 4.0–10.5)
nRBC: 0 % (ref 0.0–0.2)

## 2022-10-17 NOTE — Telephone Encounter (Signed)
Wife is calling back to further update patient symptoms, due to the call being given to the patient previously.  She states the patient has gained 25 lbs over the past several weeks and is struggling to walk. She also reports he is having SOB.   Please advise.

## 2022-10-17 NOTE — Telephone Encounter (Signed)
Pt c/o swelling/edema: STAT if pt has developed SOB within 24 hours  If swelling, where is the swelling located? Legs and feet, mainly in the left foot.   How much weight have you gained and in what time span? Unsure   Have you gained 2 pounds in a day or 5 pounds in a week? No   Do you have a log of your daily weights (if so, list)? Doesn't weigh himself   Are you currently taking a fluid pill? No   Are you currently SOB? No   Have you traveled recently in a car or plane for an extended period of time? No   Reports he got a shot from PCP for pain in his back about a week ago and swelling began after.

## 2022-10-17 NOTE — Telephone Encounter (Signed)
Per spouse patient is worn out, mostly falls down when sits, at night his breathing is rattling.   He is having to stop and take a lot of breaks to do what he needs to.  Has gained >25 pounds in one month.    Feet and legs are really swollen, his eyes have become puffy today.  He is not taking any diuretic.  Did not want to go to the ER.  I asked her to discuss with him, and at least call EMS to come out and evaluate him.  Mrs. Mcdaris called back and informed that pt is going to come in to the hospital.

## 2022-10-17 NOTE — ED Triage Notes (Signed)
Pt states his Dr instructed pt to come to ED because of excess fluid. Pt with cough, and difficulty breathing at night. Pt states swollen legs.

## 2022-10-17 NOTE — ED Notes (Addendum)
Wife reports pt was sitting next to someone in waiting room and started "talking really crazy" and it scared her. He has a wet cough. PT is able to state he is at [facility] and it is October. Rn updated him on POC.

## 2022-10-17 NOTE — Telephone Encounter (Signed)
Attempted phone call to pt.  Voicemail message left to contact office at [phone].

## 2022-10-18 MED ORDER — FUROSEMIDE 20 MG PO TABS: 40.0000 mg | ORAL_TABLET | Freq: Every day | ORAL | 0 refills | Status: DC

## 2022-10-18 MED ORDER — FUROSEMIDE 10 MG/ML IJ SOLN
40.0000 mg | Freq: Once | INTRAMUSCULAR | Status: AC
  Administered 2022-10-18: 40 mg via INTRAVENOUS
  Filled 2022-10-18: qty 4

## 2022-10-18 NOTE — ED Notes (Signed)
Ambulated patient with pulse ox, o2 stat 96% when started, and went up to 98% while ambulating.

## 2022-10-18 NOTE — Discharge Instructions (Signed)
You were seen today for concerns for shortness of breath, leg swelling, weight gain.  You do likely have some mild CHF.  Take 40 mg of Lasix daily for the next 5 days.  It is importantly follow-up with cardiology.

## 2022-10-18 NOTE — ED Provider Notes (Signed)
[place] EMERGENCY DEPARTMENT AT [facility] Provider Note   CSN: [id number] Arrival date & time: 10/17/22  1534     History  Chief Complaint  Patient presents with   Edema   Shortness of Breath    Philip Kerr is a 89 y.o. male.  HPI     This is an 89 year old male with history of aortic insufficiency, CAD, diabetes, hypertension, EF of 45 to 55% who presents with cough, shortness of breath, lower extremity edema.  He was seen and evaluated by his primary doctor 1 week ago and given a course of antibiotics and prednisone for presumed bronchitis.  He is continue to have cough and shortness of breath.  Have also noted significant weight gain up to 25 pounds and lower extremity edema.  He does have dyspnea on exertion and orthopnea.  Denies any chest pain.  No fevers.  Patient has Lasix as needed; however has not been taking it because his wife states he is "stubborn."  Home Medications Prior to Admission medications   Medication Sig Start Date End Date Taking? Authorizing Provider  furosemide (LASIX) 20 MG tablet Take 2 tablets (40 mg total) by mouth daily. 10/18/22  Yes Lynnix Schoneman, Mayer Masker, MD  acetaminophen (TYLENOL) 325 MG tablet Take 650 mg by mouth every 6 (six) hours as needed for moderate pain or headache.    [provider]  albuterol (VENTOLIN HFA) 108 (90 Base) MCG/ACT inhaler Inhale 2 puffs into the lungs every 6 (six) hours as needed for wheezing or shortness of breath. 10/12/22   Corwin Levins, MD  amiodarone (PACERONE) 200 MG tablet Take 1 tablet (200 mg total) by mouth daily. 01/13/21   Alver Sorrow, NP  amLODipine (NORVASC) 5 MG tablet Take 1 tablet (5 mg total) by mouth daily. 06/06/22   Kathleene Hazel, MD  Ascorbic Acid (VITAMIN C) 1000 MG tablet Take 1,000 mg by mouth daily.    [provider]  aspirin EC 81 MG tablet Take 1 tablet (81 mg total) by mouth daily. Swallow whole. 12/12/20   Graciella Freer, PA-C   bisacodyl (DULCOLAX) 10 MG suppository Place 10 mg rectally as needed for moderate constipation.    [provider]  Cholecalciferol (VITAMIN D3) 10 MCG (400 UNIT) tablet Take 400 Units by mouth daily.    [provider]  Cinnamon 500 MG capsule Take 1,000 mg by mouth daily.    [provider]  diclofenac Sodium (VOLTAREN) 1 % GEL Apply 2 g topically 4 (four) times daily. 06/13/22   Henson, Vickie L, NP-C  diphenhydramine-acetaminophen (TYLENOL PM) 25-500 MG TABS tablet Take 2 tablets by mouth at bedtime.    [provider]  docusate sodium (COLACE) 250 MG capsule Take 250 mg by mouth daily as needed for mild constipation.    [provider]  doxazosin (CARDURA) 2 MG tablet TAKE 1 TABLET BY MOUTH AT BEDTIME. 08/09/22   Myrlene Broker, MD  furosemide (LASIX) 20 MG tablet Take 1 tablet (20 mg total) by mouth as needed. Patient taking differently: Take 20 mg by mouth as needed for fluid or edema. 11/14/20 04/23/21  Alver Sorrow, NP  HYDROcodone bit-homatropine (HYCODAN) 5-1.5 MG/5ML syrup Take 5 mLs by mouth every 6 (six) hours as needed for up to 10 days. 10/12/22 10/22/22  Corwin Levins, MD  levofloxacin (LEVAQUIN) 500 MG tablet Take 1 tablet (500 mg total) by mouth daily for 10 days. 10/12/22 10/22/22  Corwin Levins,  MD  ondansetron (ZOFRAN) 4 MG tablet Take 1 tablet (4 mg total) by mouth every 8 (eight) hours as needed for nausea or vomiting. 12/13/20   Corwin Levins, MD  oxybutynin (DITROPAN) 5 MG tablet TAKE 1 TABLET EVERY 8 HOURS AS NEEDED FOR UP TO 14 DAYS FOR BLADDER SPASMS. 07/17/21   Corwin Levins, MD  predniSONE (DELTASONE) 10 MG tablet 3 tabs by mouth per day for 3 days,2tabs per day for 3 days,1tab per day for 3 days 10/12/22   Corwin Levins, MD  QUEtiapine (SEROQUEL) 50 MG tablet Take by mouth. 10/12/22   [provider]  rosuvastatin (CRESTOR) 20 MG tablet TAKE 1 TABLET BY MOUTH EVERY DAY 08/27/22   Kathleene Hazel, MD   tamsulosin (FLOMAX) 0.4 MG CAPS capsule TAKE 1 CAPSULE BY MOUTH EVERY DAY AFTER SUPPER 09/24/22   Corwin Levins, MD  traZODone (DESYREL) 50 MG tablet TAKE 1/2 TO 1 TABLET BY MOUTH AT BEDTIME AS NEEDED FOR SLEEP 07/20/22   Corwin Levins, MD  vitamin E 180 MG (400 UNITS) capsule Take 400 Units by mouth daily.    [provider]      Allergies    Patient has no known allergies.    Review of Systems   Review of Systems  Constitutional:  Negative for fever.  Respiratory:  Positive for cough and shortness of breath.   Cardiovascular:  Positive for leg swelling. Negative for chest pain.  All other systems reviewed and are negative.   Physical Exam Updated Vital Signs BP 130/87   Pulse 85   Temp 97.6 F (36.4 C)   Resp 18   Ht 1.778 m (5\' 10" )   Wt 78.9 kg   SpO2 97%   BMI 24.96 kg/m  Physical Exam Vitals and nursing note reviewed.  Constitutional:      Appearance: He is well-developed.  HENT:     Head: Normocephalic and atraumatic.  Eyes:     Pupils: Pupils are equal, round, and reactive to light.  Cardiovascular:     Rate and Rhythm: Normal rate and regular rhythm.     Heart sounds: Normal heart sounds. No murmur heard. Pulmonary:     Effort: Pulmonary effort is normal. No respiratory distress.     Breath sounds: Wheezing present.  Abdominal:     General: Bowel sounds are normal.     Palpations: Abdomen is soft.     Tenderness: There is no abdominal tenderness. There is no rebound.  Musculoskeletal:     Cervical back: Neck supple.     Right lower leg: Edema present.     Left lower leg: Edema present.     Comments: 1+ pitting symmetric edema bilaterally  Lymphadenopathy:     Cervical: No cervical adenopathy.  Skin:    General: Skin is warm and dry.  Neurological:     Mental Status: He is alert and oriented to person, place, and time.  Psychiatric:        Mood and Affect: Mood normal.     ED Results / Procedures / Treatments   Labs (all labs ordered  are listed, but only abnormal results are displayed) Labs Reviewed  CBC - Abnormal; Notable for the following components:      Result Value   WBC 11.8 (*)    RBC 3.56 (*)    Hemoglobin 11.2 (*)    HCT 36.0 (*)    MCV 101.1 (*)    All other components within normal limits  BASIC METABOLIC PANEL - Abnormal; Notable for the following components:   Glucose, Bld 133 (*)    BUN 27 (*)    Creatinine, Ser 1.49 (*)    Calcium 8.8 (*)    GFR, Estimated 45 (*)    All other components within normal limits  BRAIN NATRIURETIC PEPTIDE - Abnormal; Notable for the following components:   B Natriuretic Peptide 2,431.6 (*)    All other components within normal limits    EKG EKG Interpretation Date/Time:  Thursday October 18 2022 01:41:48 EDT Ventricular Rate:  86 PR Interval:  184 QRS Duration:  167 QT Interval:  450 QTC Calculation: 539 R Axis:   -54  Text Interpretation: Sinus rhythm Ventricular premature complex LVH with IVCD, LAD and secondary repol abnrm Prolonged QT interval Confirmed by Ross Marcus ([id number]) on 10/18/2022 3:00:59 AM  Radiology DG Chest 2 View  Result Date: 10/17/2022 CLINICAL DATA:  Shortness of breath. EXAM: CHEST - 2 VIEW COMPARISON:  October 12, 2022. FINDINGS: Stable cardiomediastinal silhouette. Status post coronary artery bypass graft. Left-sided pacemaker is unchanged position. Mild left basilar atelectasis or infiltrate is noted with probable small pleural effusion. Minimal right basilar subsegmental atelectasis or scarring is noted. IMPRESSION: Left basilar atelectasis or infiltrate is noted with probable small left pleural effusion. Electronically Signed   By: Lupita Raider M.D.   On: 10/17/2022 19:36    Procedures .Critical Care  Performed by: Shon Baton, MD Authorized by: Shon Baton, MD   Critical care provider statement:    Critical care time (minutes):  40   Critical care was necessary to treat or prevent imminent or  life-threatening deterioration of the following conditions:  Cardiac failure   Critical care was time spent personally by me on the following activities:  Development of treatment plan with patient or surrogate, discussions with consultants, evaluation of patient's response to treatment, examination of patient, ordering and review of laboratory studies, ordering and review of radiographic studies, ordering and performing treatments and interventions, pulse oximetry, re-evaluation of patient's condition and review of old charts     Medications Ordered in ED Medications  furosemide (LASIX) injection 40 mg (40 mg Intravenous Given 10/18/22 0207)    ED Course/ Medical Decision Making/ A&P Clinical Course as of 10/18/22 0601  Thu Oct 18, 2022  2951 Pacemaker data reviewed.  1 nonsustained episode of V. tach on October 7.  Otherwise no events. [CH]  0418 Diuresis with IV Lasix.  Vital signs reassuring.  Patient ambulated without difficulty and maintained his pulse ox.  Wife at the bedside appears frustrated with his ongoing shortness of breath.  Discussed with her that I would send a note to cardiology.  Feel he needs to be on Lasix scheduled for the next 5-7 days. [CH]    Clinical Course User Index [CH] Tremel Setters, Mayer Masker, MD                                 Medical Decision Making Amount and/or Complexity of Data Reviewed Labs: ordered. Radiology: ordered.  Risk Prescription drug management.   This patient presents to the ED for concern of shortness of breath, lower extremity edema, this involves an extensive number of treatment options, and is a complaint that carries with it a high risk of complications and morbidity.  I considered the following differential and admission for this acute, potentially life threatening condition.  The differential diagnosis includes CHF,  pneumonia, ACS  MDM:    This is an 89 year old male who presents with shortness of breath and lower extremity edema.   He is overall nontoxic and vital signs are reassuring.  He is in no respiratory distress and oxygen saturations are 97 to 98%.  Chest x-ray shows some left basilar atelectasis versus infiltrate.  I would favor likely edema given his physical exam.  BNP is elevated to 2431.  Labs otherwise largely reassuring.  Clinically I do not favor pneumonia.  Patient was given 40 mg of IV Lasix and diuresed almost 900 cc immediately.  He remains without any respiratory distress.  He ambulated and maintained his pulse ox greater than 97%.  While he does appear volume overloaded, he has not been taking his Lasix at home.  Recommend that he start 40 mg of IV Lasix for the next 5 days and follow-up closely with cardiology.  Discussed this with the patient and his wife.  EKG shows no evidence of active ischemia.  He is without chest pain.  Pacemaker interrogated as above.  (Labs, imaging, consults)  Labs: I Ordered, and personally interpreted labs.  The pertinent results include: CBC, BMP, BNP  Imaging Studies ordered: I ordered imaging studies including chest x-ray I independently visualized and interpreted imaging. I agree with the radiologist interpretation  Additional history obtained from wife at bedside.  External records from outside source obtained and reviewed including cardiology notes  Cardiac Monitoring: The patient was maintained on a cardiac monitor.  If on the cardiac monitor, I personally viewed and interpreted the cardiac monitored which showed an underlying rhythm of: Sinus rhythm  Reevaluation: After the interventions noted above, I reevaluated the patient and found that they have :improved  Social Determinants of Health:  lives with wife  Disposition: Discharge  Co morbidities that complicate the patient evaluation  Past Medical History:  Diagnosis Date   Aortic insufficiency    Coronary artery disease    coronary artery bypass graft x 5 in 1996   Diabetes mellitus     Hypercholesterolemia    non-insulin dependent   Hypertension    Ischemic cardiomyopathy    Mitral regurgitation    PVC (premature ventricular contraction)    Sinus pause 12/07/2020   s/p PPM     Medicines Meds ordered this encounter  Medications   furosemide (LASIX) injection 40 mg   furosemide (LASIX) 20 MG tablet    Sig: Take 2 tablets (40 mg total) by mouth daily.    Dispense:  5 tablet    Refill:  0    I have reviewed the patients home medicines and have made adjustments as needed  Problem List / ED Course: Problem List Items Addressed This Visit   None Visit Diagnoses     Acute on chronic systolic congestive heart failure (HCC)    -  Primary   Relevant Medications   furosemide (LASIX) injection 40 mg (Completed)   furosemide (LASIX) 20 MG tablet                   Final Clinical Impression(s) / ED Diagnoses Final diagnoses:  Acute on chronic systolic congestive heart failure (HCC)    Rx / DC Orders ED Discharge Orders          Ordered    furosemide (LASIX) 20 MG tablet  Daily        10/18/22 0600              Jaxton Casale, Mayer Masker, MD 10/18/22  0603  

## 2022-10-18 NOTE — ED Notes (Signed)
Pacemaker interrogated, data sent, waiting for results.

## 2022-10-22 IMAGING — US US ABDOMEN LIMITED
1 series · 14 of 25 positions shown · non-contrast
Comparison: None.

CLINICAL DATA: Transaminitis.

EXAM:
ULTRASOUND ABDOMEN LIMITED RIGHT UPPER QUADRANT

[Series 1: us abdomen limited ruq (liver/gb) · 14 of 31 slices shown]
[im 1/31]
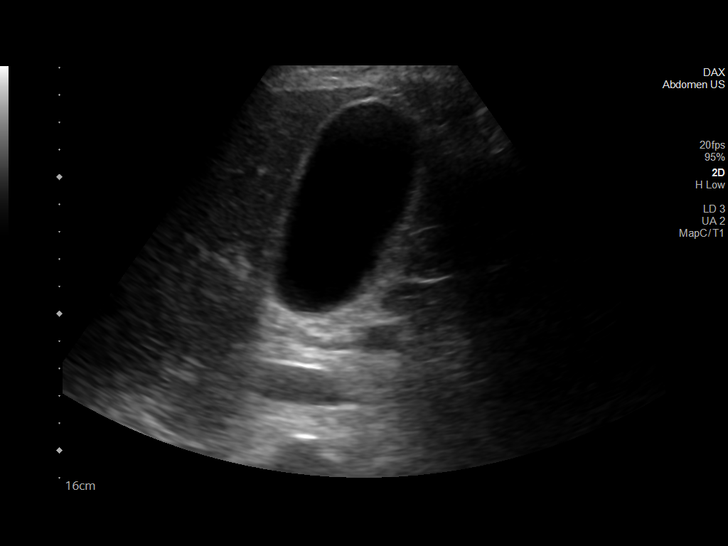
[im 3/31]
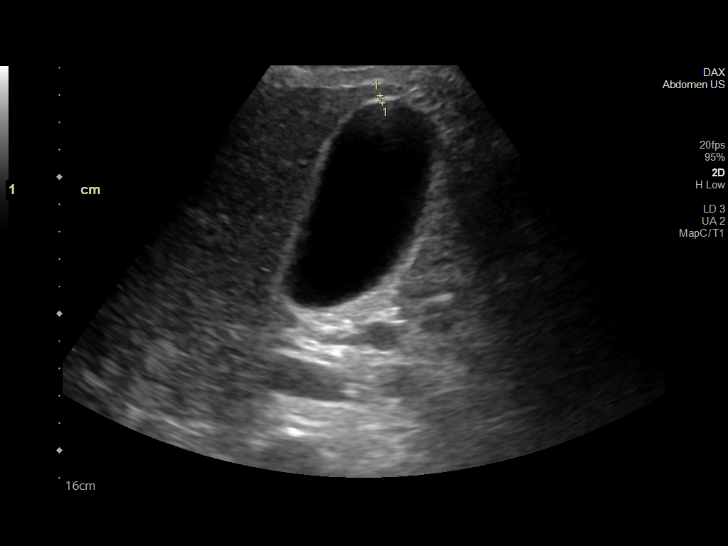
[im 6/31]
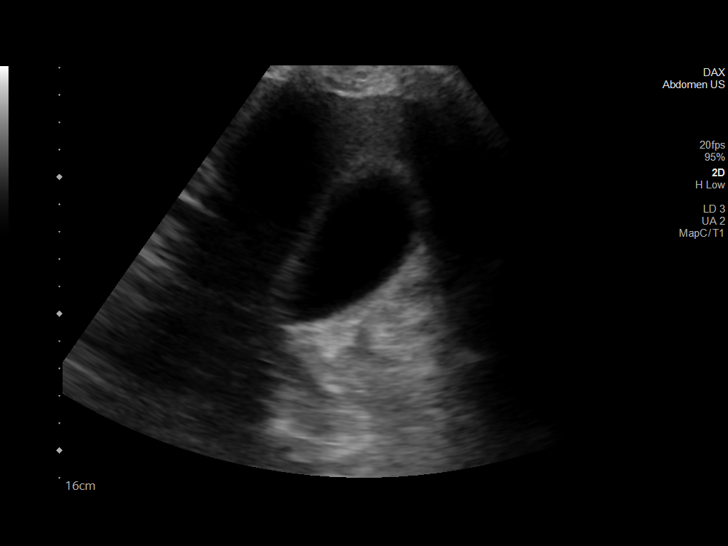
[im 8/31]
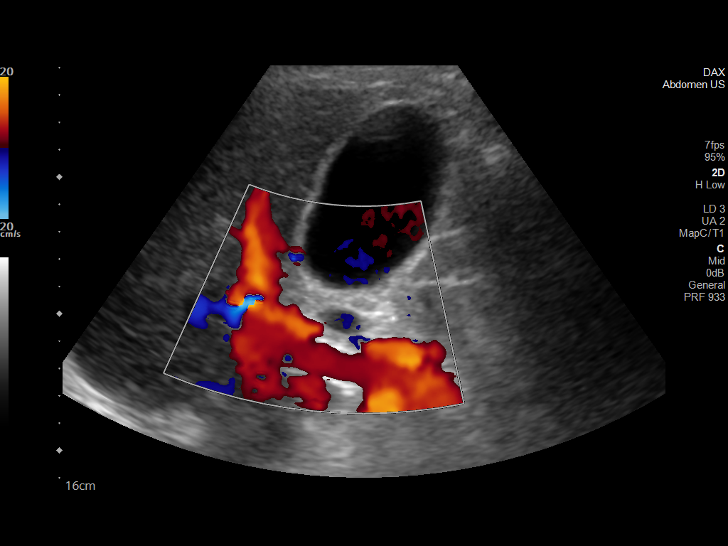
[im 11/31]
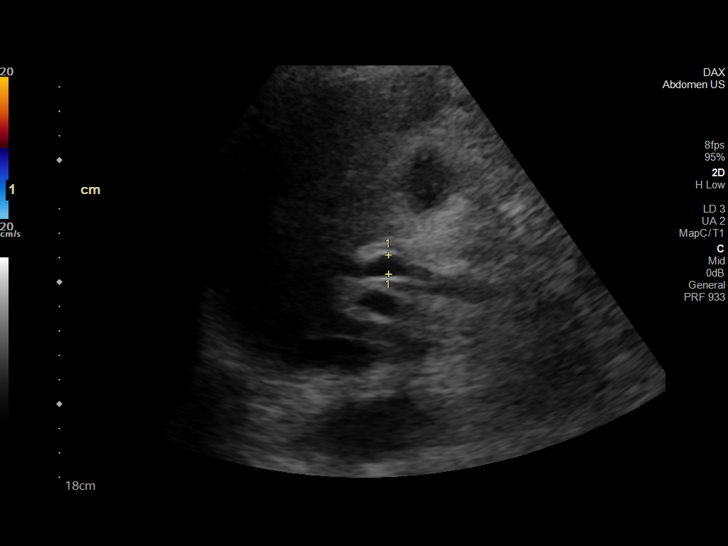
[im 12/31]
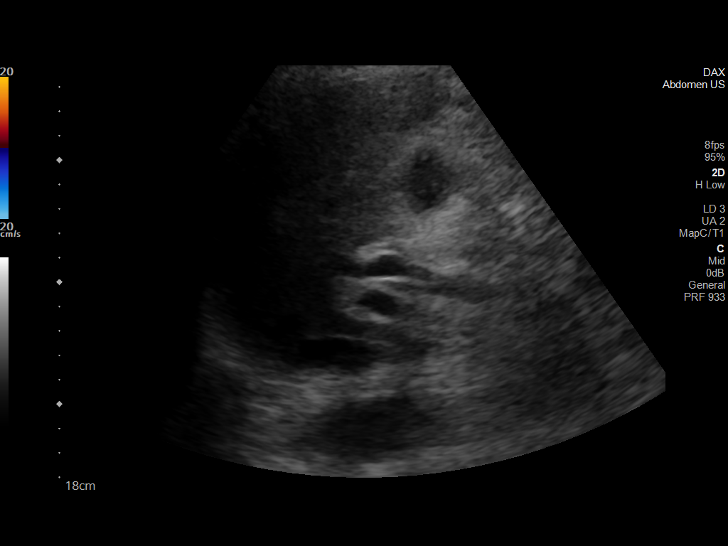
[im 14/31]
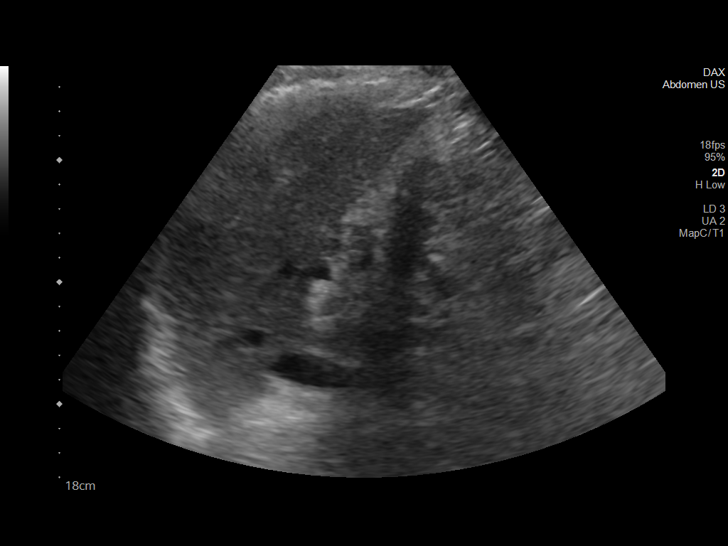
[im 17/31]
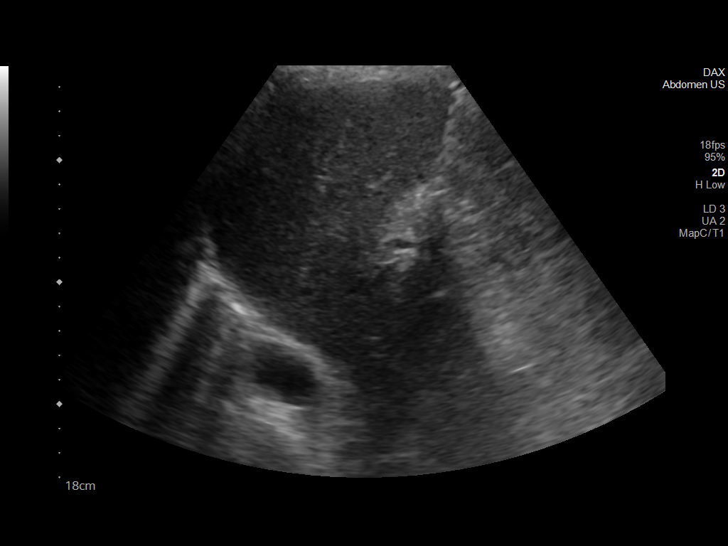
[im 19/31]
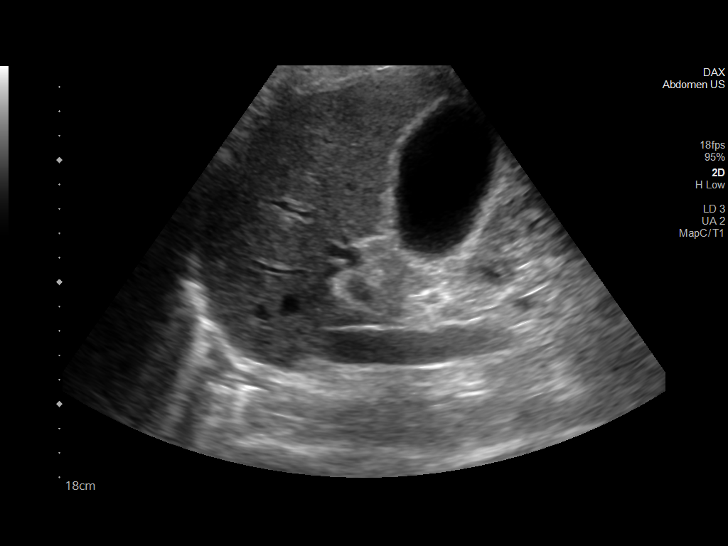
[im 21/31]
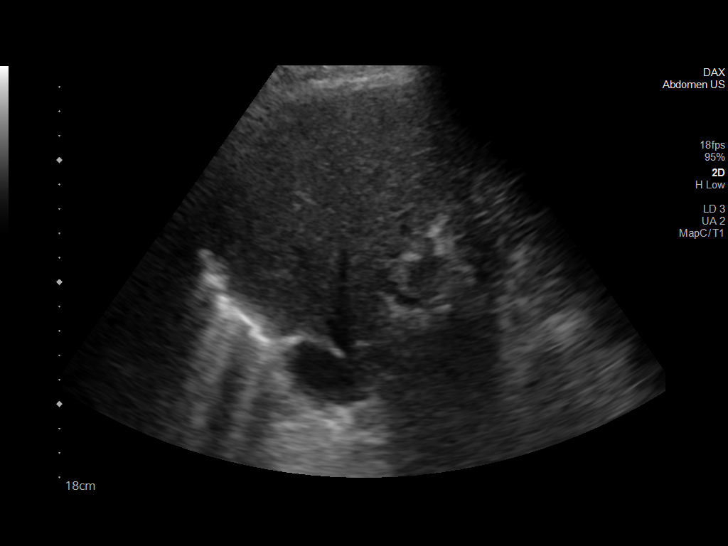
[im 23/31]
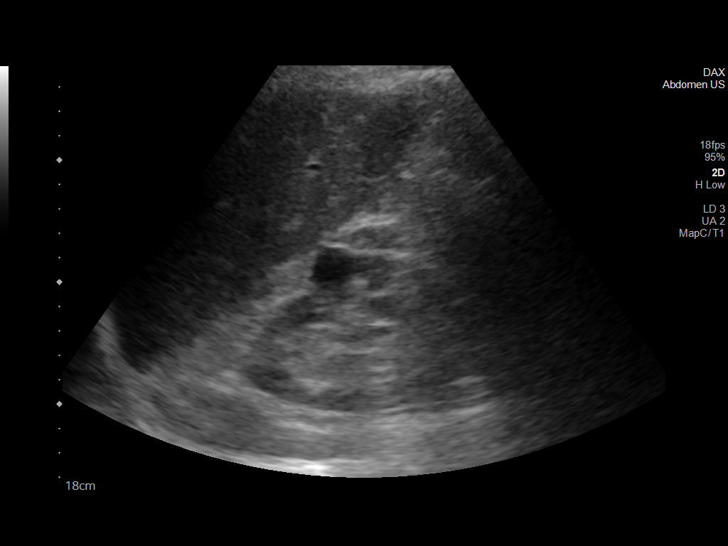
[im 26/31]
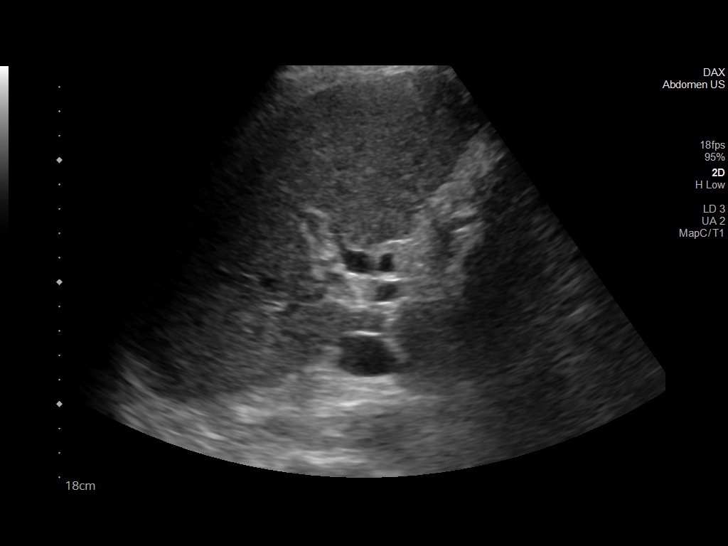
[im 28/31]
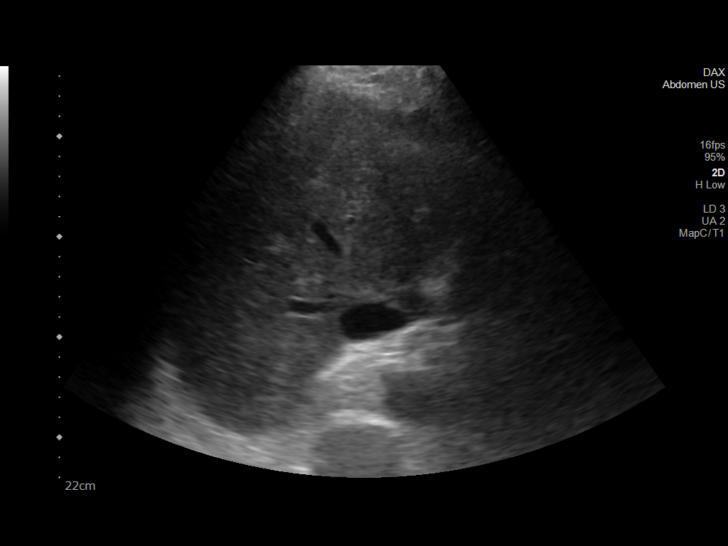
[im 31/31]
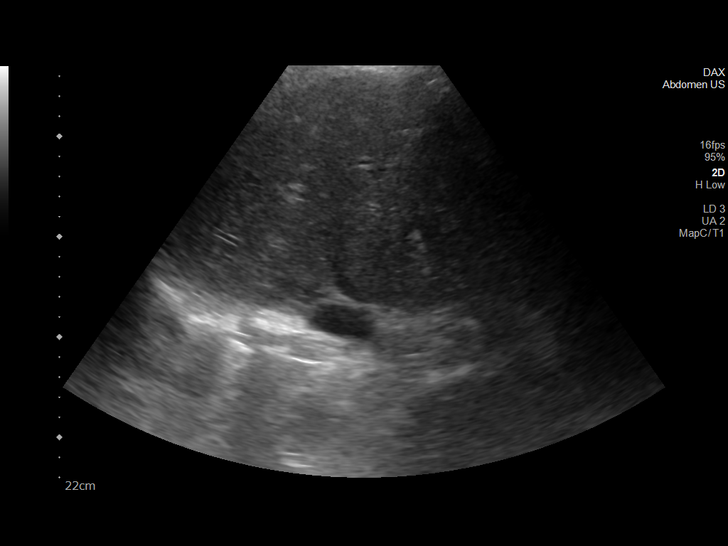

[14 of 25 positions shown; findings below may reference images not displayed]

FINDINGS: Gallbladder:

No gallstones or wall thickening visualized. No sonographic Murphy
sign noted by sonographer.

Common bile duct:

Diameter: 8 mm with distal tapering.  No evidence of a duct stone.

Liver:

Coarsened echotexture. Normal overall liver size. No mass or focal
lesion. Mild central intrahepatic bile duct dilation. Portal vein is
patent on color Doppler imaging with normal direction of blood flow
towards the liver.

Other: None.
IMPRESSION: 1. No acute findings.
2. Common bile duct measures 8 mm and there is mild central
intrahepatic bile duct dilation. This is likely chronic. However, if
there are symptoms biliary obstruction, follow-up MRCP or ERCP would
be recommended.
3. Coarsened echotexture of the liver, nonspecific. Normal liver
size. No masses.

## 2022-10-23 IMAGING — CT CT ABD-PELV W/O CM
2 of 5 series · 15 of 46 positions shown, 17 images · non-contrast
Comparison: None.

CLINICAL DATA: Abdominal pain

EXAM:
CT ABDOMEN AND PELVIS WITHOUT CONTRAST
TECHNIQUE: Multidetector CT imaging of the abdomen and pelvis was performed
following the standard protocol without IV contrast.

[Series 3: abd/ pelvis 5.0 i30f 2 · axial · 0.93mm/px · z∈[+995,+1460]mm · 12 of 103 slices shown, 14 images]
[im 5/103  soft-tissue]
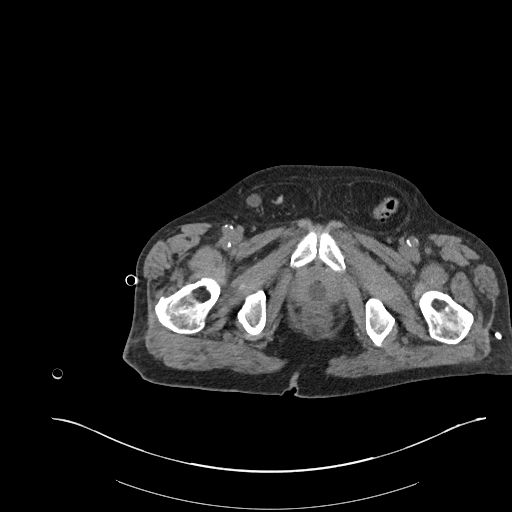
[im 5/103  bone]
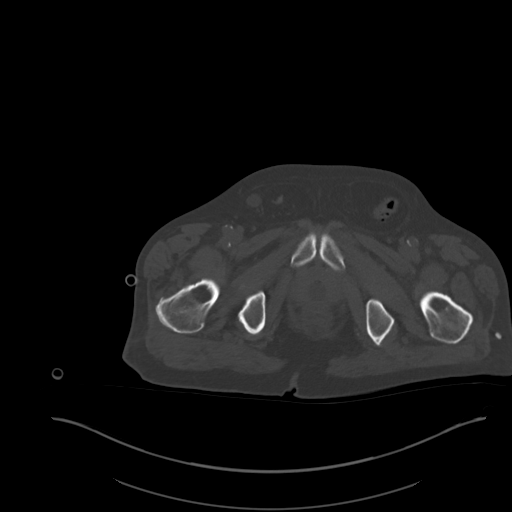
[im 14/103  soft-tissue]
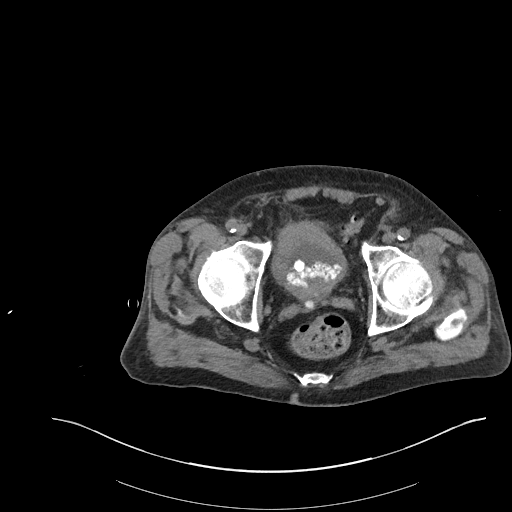
[im 24/103  soft-tissue]
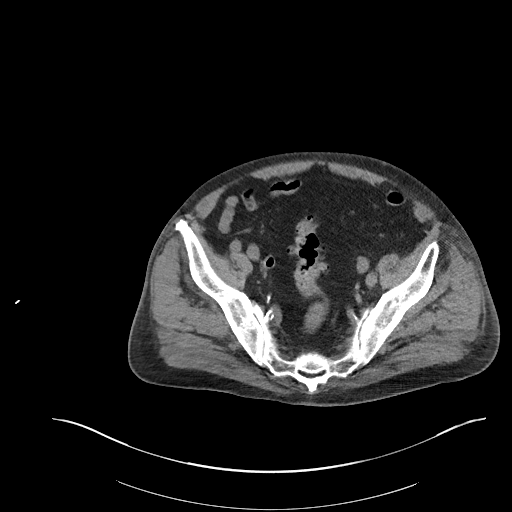
[im 33/103  soft-tissue]
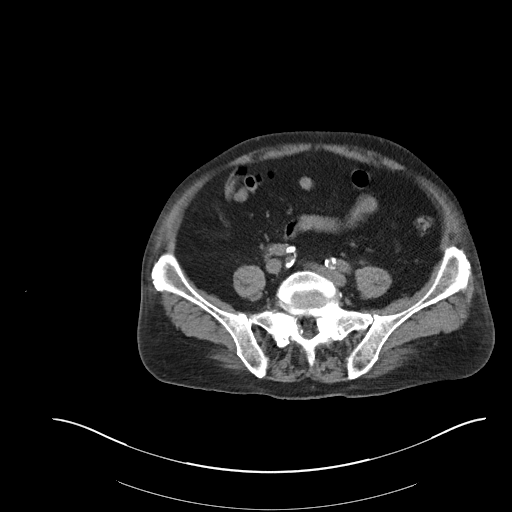
[im 38/103  soft-tissue]
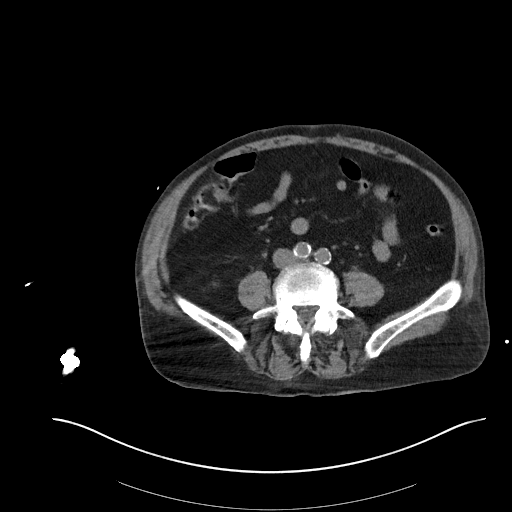
[im 47/103  soft-tissue]
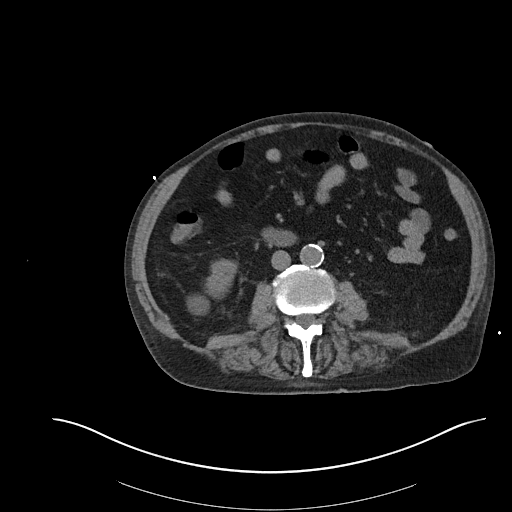
[im 56/103  soft-tissue]
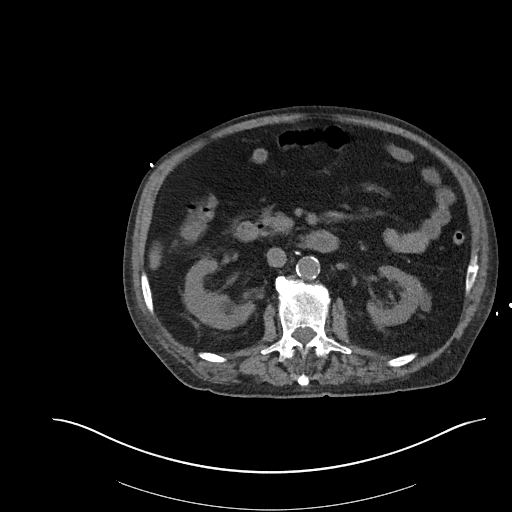
[im 65/103  soft-tissue]
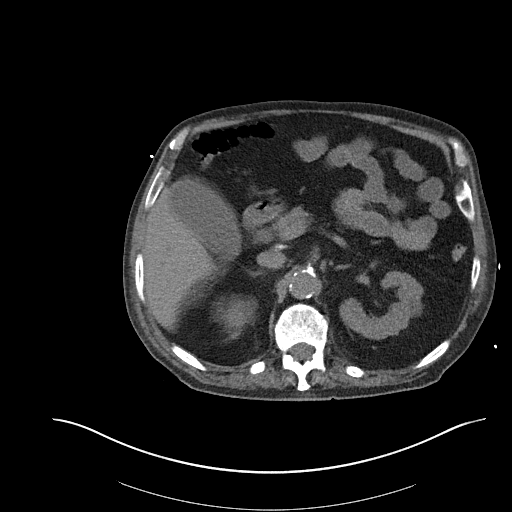
[im 70/103  soft-tissue]
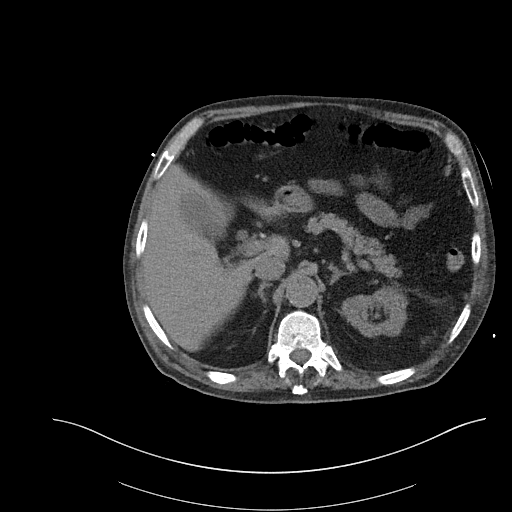
[im 70/103  bone]
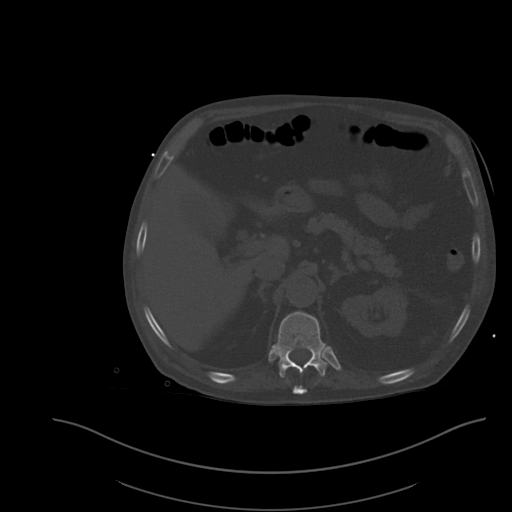
[im 79/103  soft-tissue]
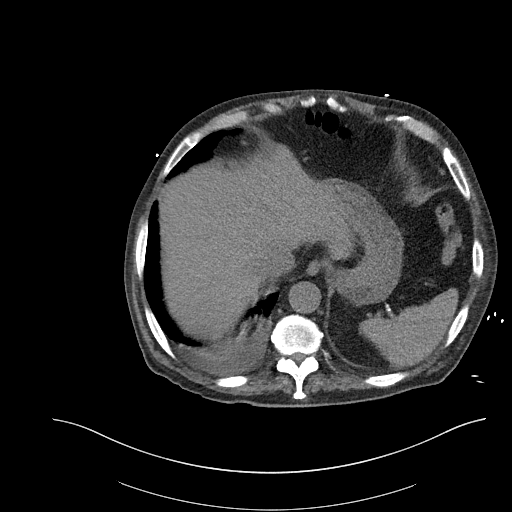
[im 89/103  soft-tissue]
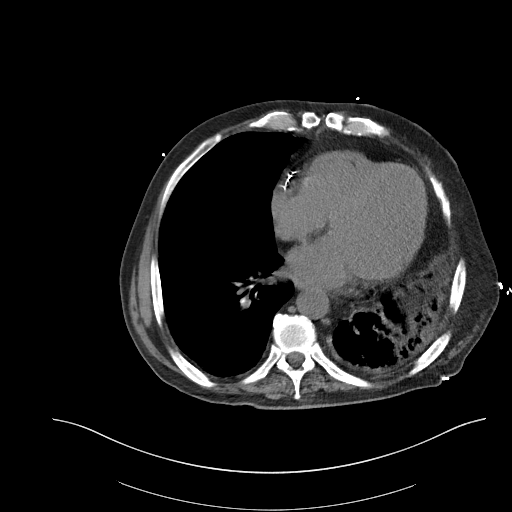
[im 98/103  soft-tissue]
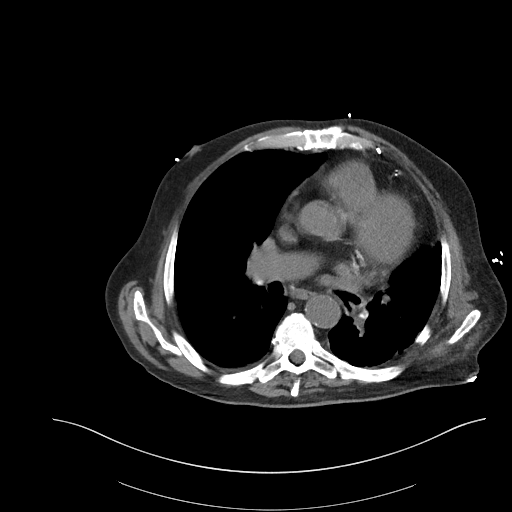

[Series 6: cor st · coronal · 0.84mm/px · 3 of 100 slices shown]
[im 34/100  soft-tissue]
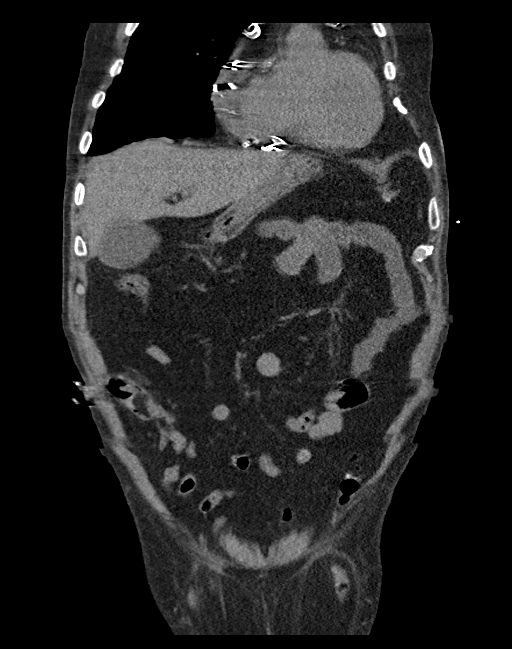
[im 45/100  soft-tissue]
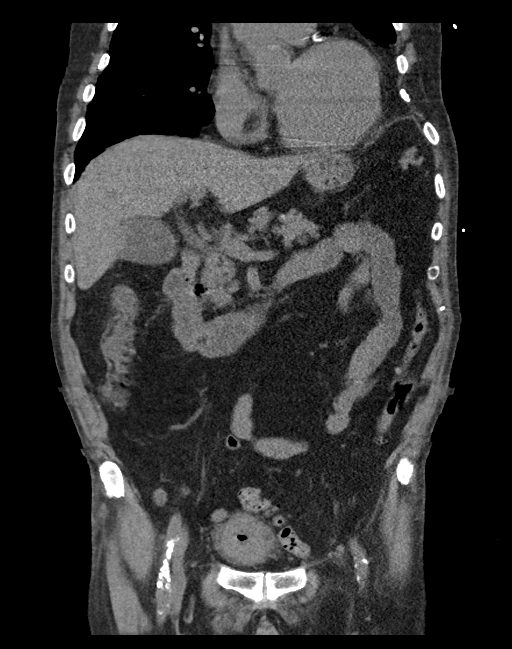
[im 56/100  soft-tissue]
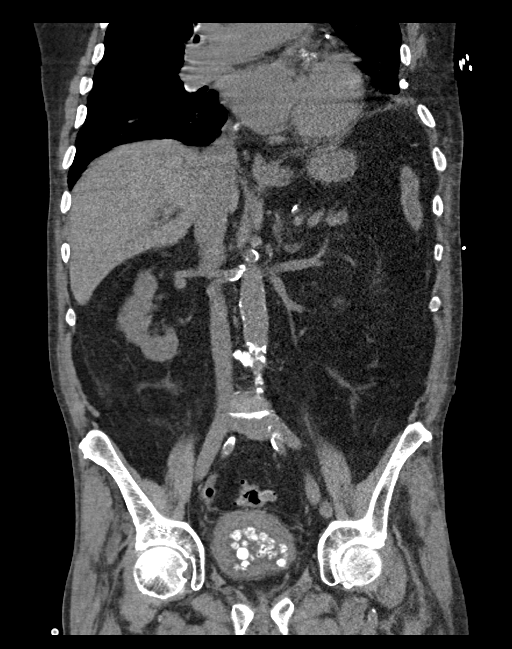

[15 of 46 positions shown; findings below may reference images not displayed]

FINDINGS: Lower chest: Previous CABG. Trace right pleural effusion. Coarse
parenchymal and airspace opacities at the left lung base. 1 cm
nodular opacity in the posterior right lower lobe (Im16,Se5) .

Hepatobiliary: No focal liver abnormality is seen. No gallstones,
gallbladder wall thickening, or biliary dilatation.

Pancreas: Unremarkable. No pancreatic ductal dilatation or
surrounding inflammatory changes.

Spleen: Normal in size without focal abnormality.

Adrenals/Urinary Tract: Adrenal glands unremarkable. Bilateral
partially exophytic renal lesions possibly cysts but incompletely
characterized, largest 2.9 cm from the right lower pole. No renal
calculus or hydronephrosis. Innumerable partially calcified stones
measuring up to 1.1 cm layer in the nondistended urinary bladder.
There is 1.9 cm right lateral diverticulum containing stones. There
is bladder wall thickening. Foley catheter has its balloon within
the prostate gland.

Stomach/Bowel: Stomach is nondistended. The small bowel is
decompressed. Colon is nondistended with scattered diverticula most
numerous in the sigmoid 7 segment, without adjacent inflammatory
change. The descending/sigmoid junction protrudes into left inguinal
hernia, without obstruction or strangulation.

Vascular/Lymphatic: Extensive calcified aortoiliac plaque without
aneurysm. No abdominal or pelvic adenopathy.

Reproductive: Prostate enlargement. The Foley catheter balloon is
positioned centrally within the prostate.

Other: Left pelvic phleboliths.  No ascites.  No free air.

Musculoskeletal: Left inguinal hernia, involving the
descending/sigmoid junction of the colon without obstruction or
strangulation. Right inguinal hernia containing mostly fat, possible
nondistended testicle. Sternotomy wires. No fracture or worrisome
bone lesion.
IMPRESSION: 1. Colonic involvement in left inguinal hernia, without obstruction
or strangulation.
2. Foley catheter balloon malpositioned centrally within the
prostate gland.
3. Innumerable bladder calculi.
4. Colonic diverticulosis
5. Coarse left lower lobe airspace opacities of uncertain
chronicity. Recommend comparison to any previous available outside
studies.
6.  Aortic Atherosclerosis (WF7CI-170.0).

## 2022-10-23 IMAGING — US US RENAL
1 series · 14 of 25 positions shown · non-contrast
Comparison: None.

CLINICAL DATA: EONXC-PN positive.  Acute renal insufficiency.

EXAM:
RENAL / URINARY TRACT ULTRASOUND COMPLETE

[Series 1: us renal · 14 of 45 slices shown]
[im 1/45]
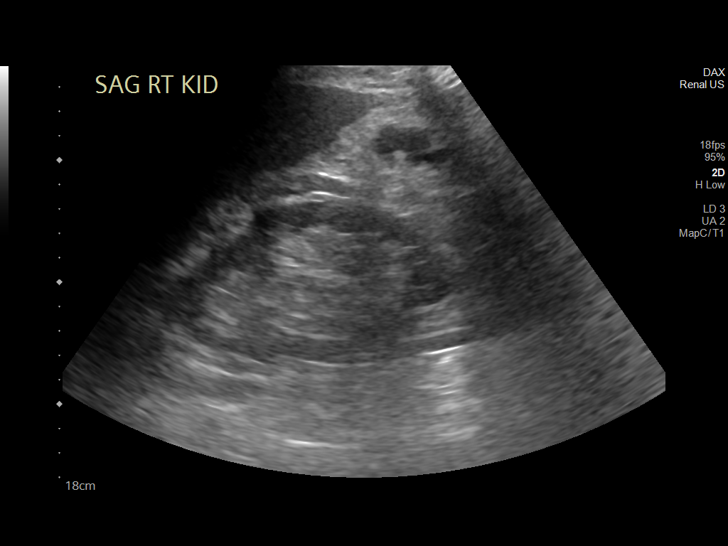
[im 4/45]
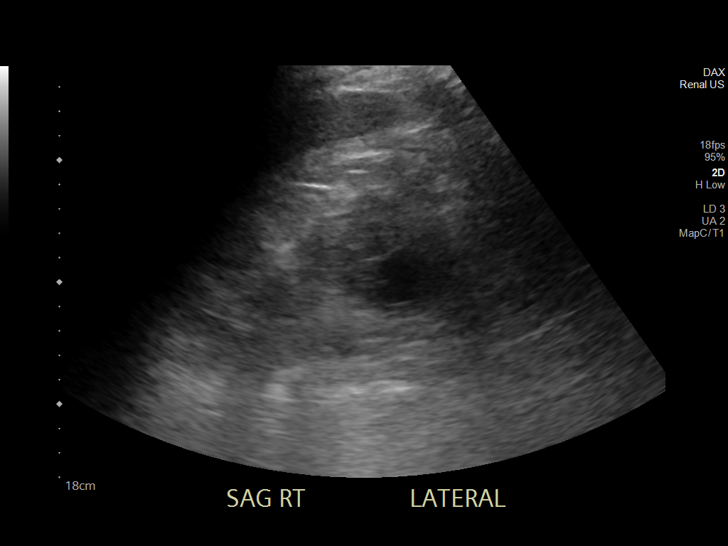
[im 8/45]
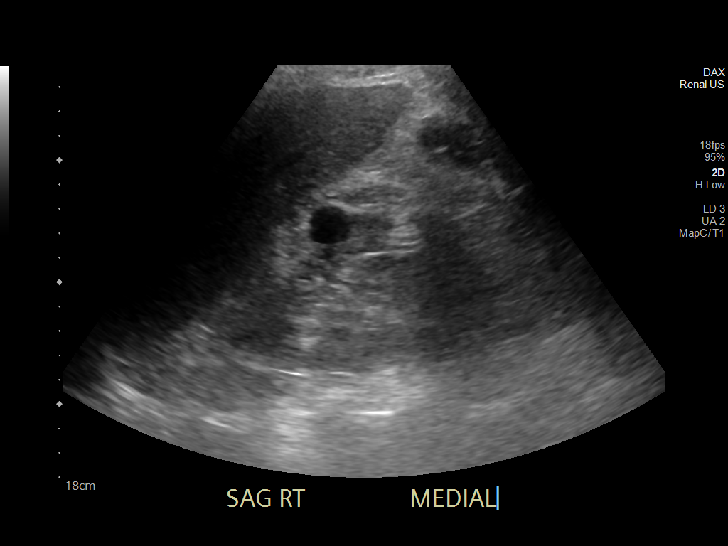
[im 12/45]
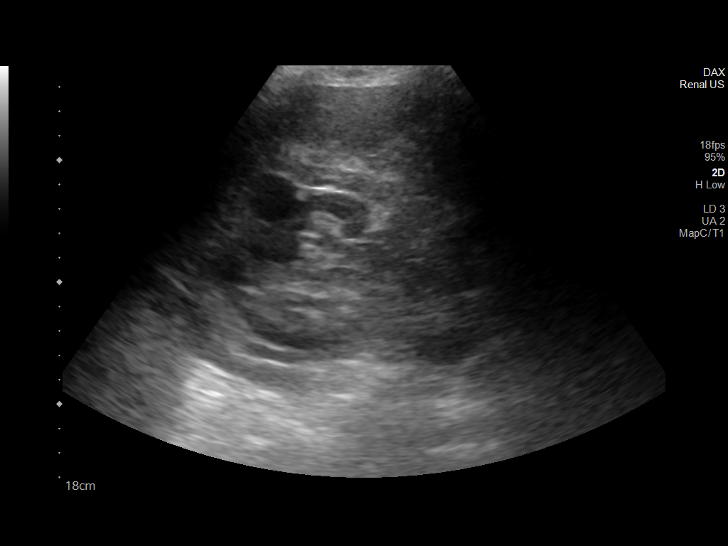
[im 15/45]
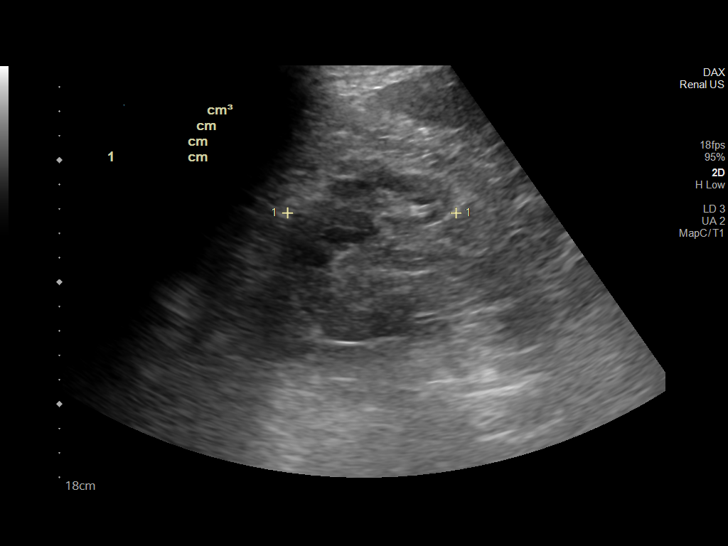
[im 17/45]
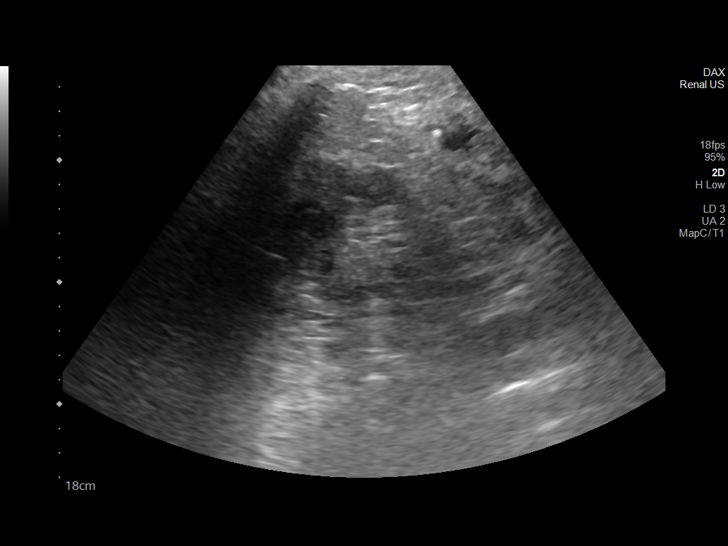
[im 21/45]
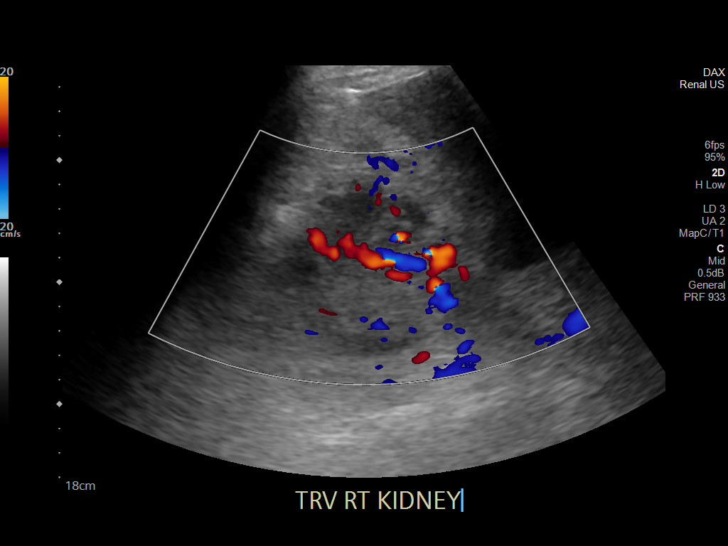
[im 24/45]
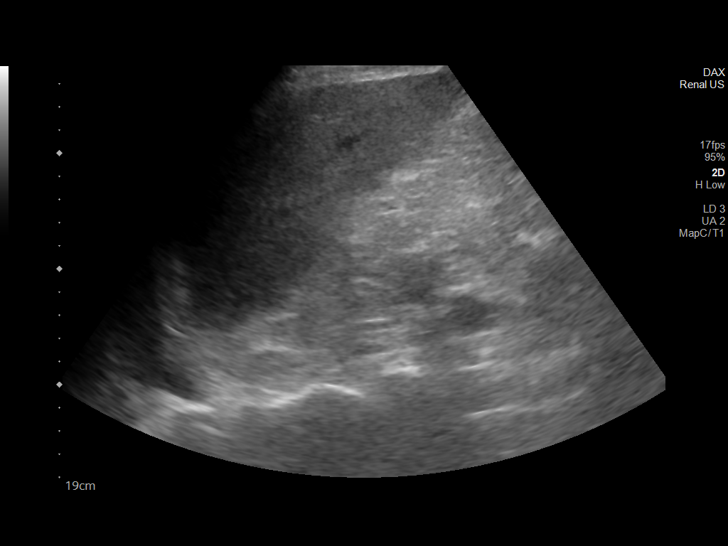
[im 28/45]
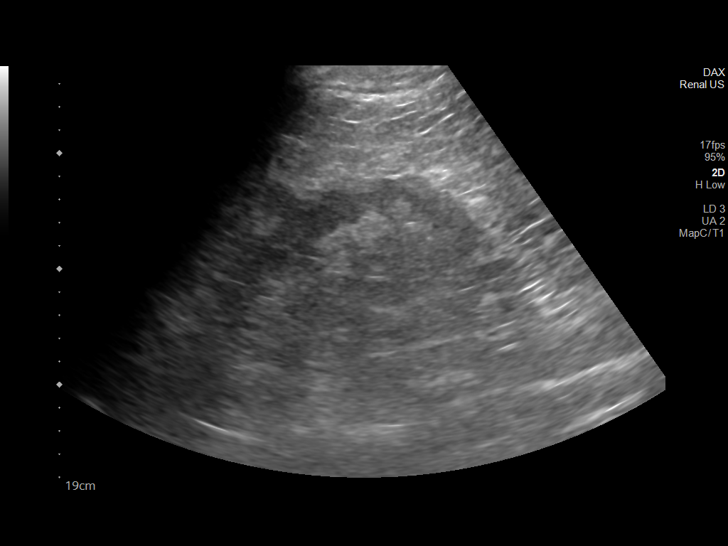
[im 30/45]
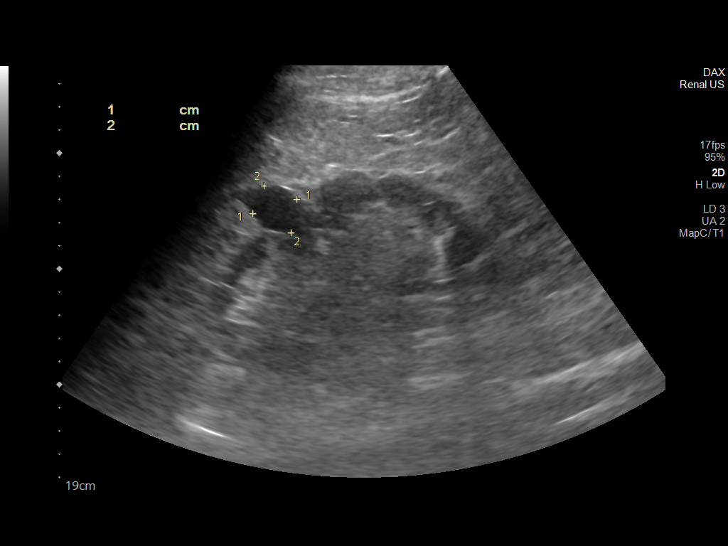
[im 34/45]
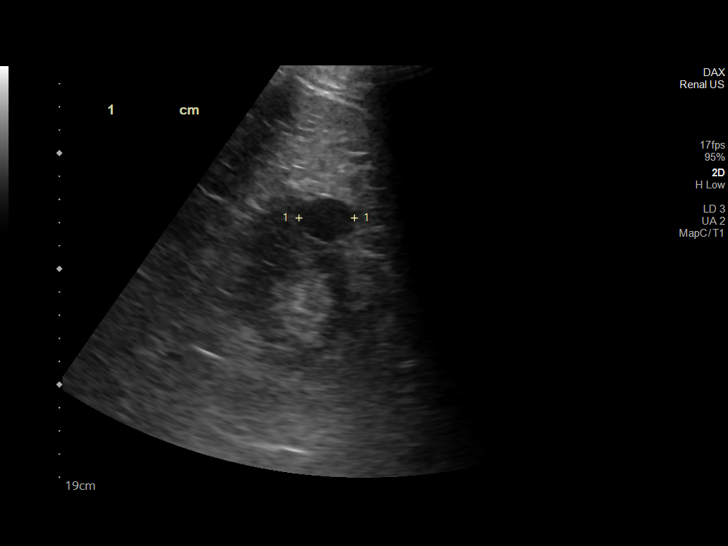
[im 37/45]
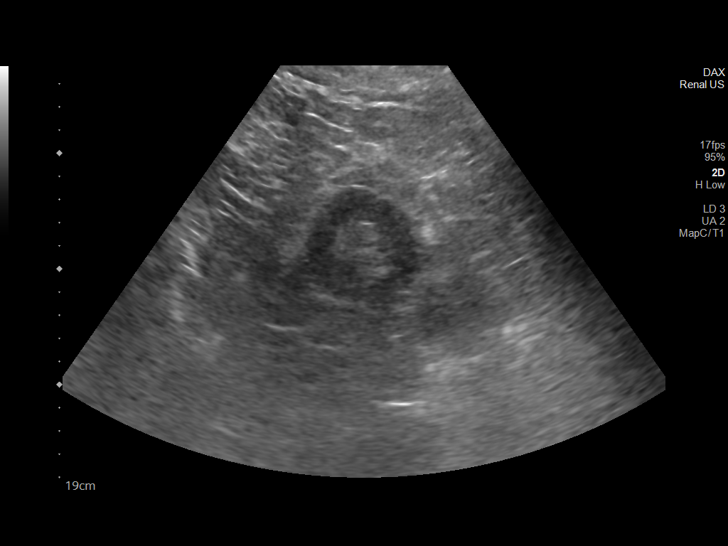
[im 41/45]
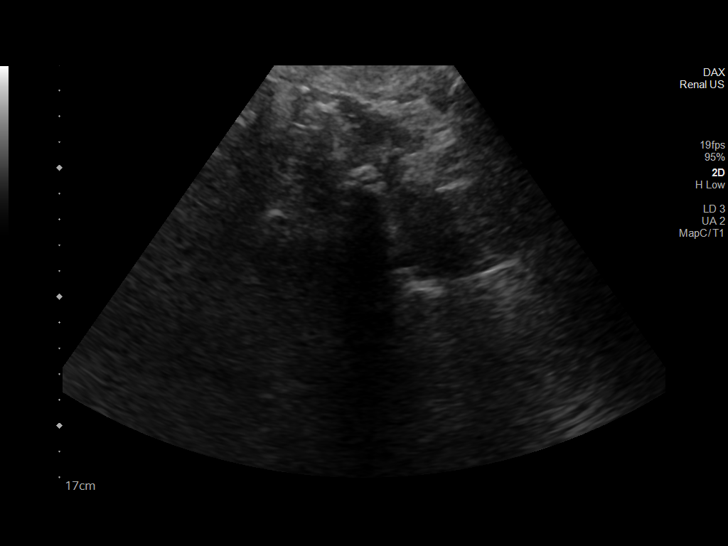
[im 45/45]
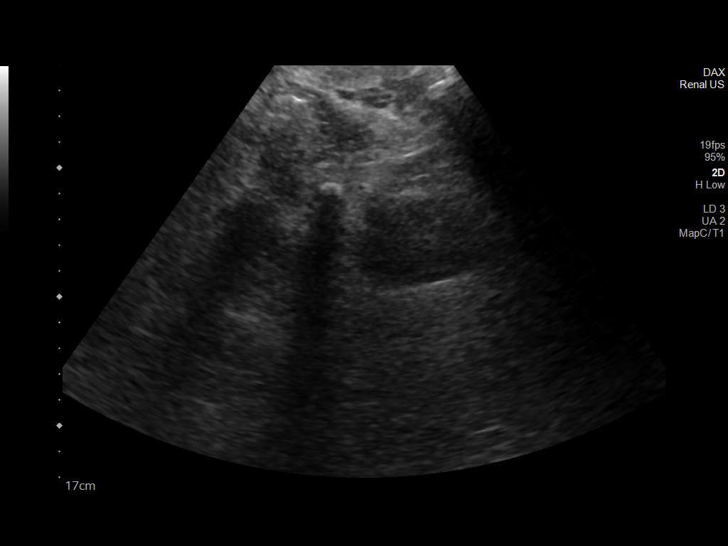

[14 of 25 positions shown; findings below may reference images not displayed]

FINDINGS: Right Kidney:

Renal measurements: 12.1 x 6.6 x 6.9 cm = volume: 288 mL. Multiple
cysts, less than 10, identified throughout the right kidney. The
largest inferiorly measures 2.3 x 2.1 x 2.6 cm.

Left Kidney:

Renal measurements: 14.1 x 7.6 x 7.0 cm = volume: 391 mL. Several
cysts, less than 10, identified throughout the left kidney. The
largest superiorly measures 2.0 x 2.3 x 2.4 cm.

Bladder:

Decompressed with a Foley catheter.

Other:

None.
IMPRESSION: 1. Bilateral renal cysts.  No other significant abnormalities.

## 2022-10-23 IMAGING — DX DG CHEST 1V PORT
1 series · 1 of 1 positions shown · non-contrast
Comparison: Radiograph 07/22/2020

CLINICAL DATA: Severe shortness of breath.  COVID positive

EXAM:
PORTABLE CHEST 1 VIEW

[chest]
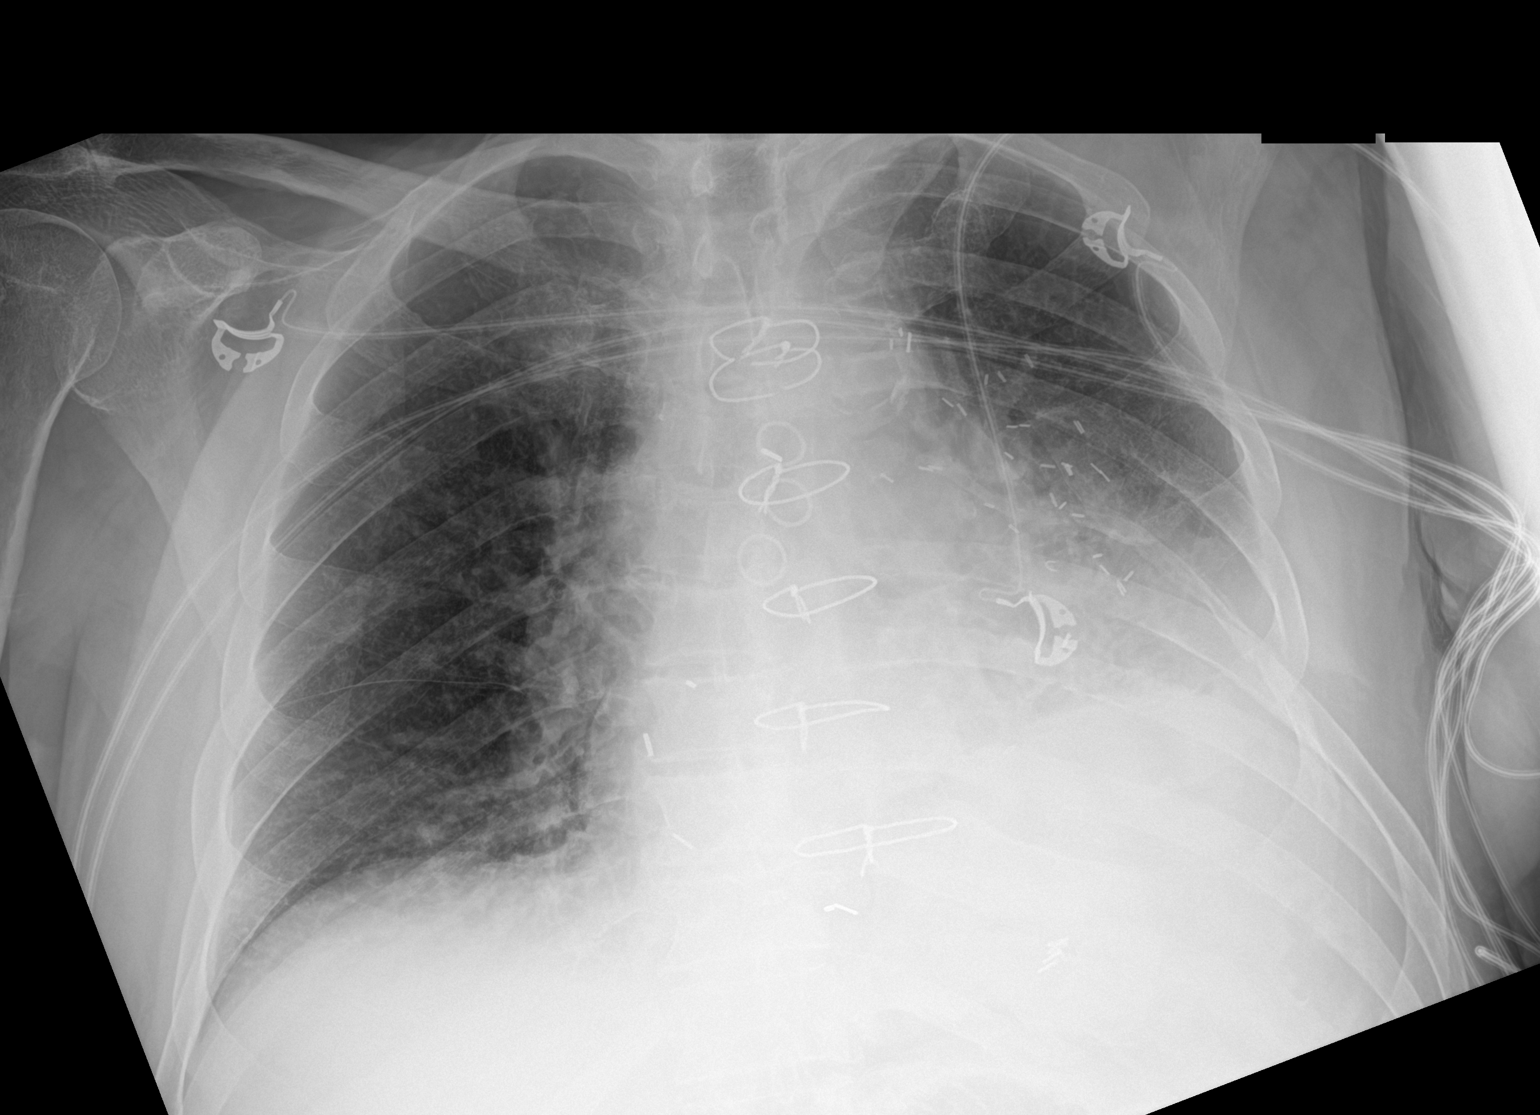

[1 of 1 positions shown; findings below may reference images not displayed]

FINDINGS: Sternal wires over enlarged cardiac silhouette. There is LEFT lower
lobe atelectasis and small effusion. RIGHT lung clear.
IMPRESSION: 1. No interval change.
2. LEFT lower lobe atelectasis and effusion.

## 2022-10-26 ENCOUNTER — Other Ambulatory Visit: Payer: Self-pay | Admitting: Cardiovascular Disease

## 2022-10-26 ENCOUNTER — Telehealth: Payer: Self-pay | Admitting: Cardiovascular Disease

## 2022-10-26 MED ORDER — FUROSEMIDE 20 MG PO TABS: 20.0000 mg | ORAL_TABLET | Freq: Every day | ORAL | 0 refills | Status: DC

## 2022-10-26 NOTE — Telephone Encounter (Signed)
Pt is requesting a refill on furosemide 20 mg tablets. This medication has 2 different sigs. Please clarify which directions pt should be taking. Please address

## 2022-10-26 NOTE — Telephone Encounter (Signed)
*  STAT* If patient is at the pharmacy, call can be transferred to refill team.   1. Which medications need to be refilled? (please list name of each medication and dose if known)  furosemide (LASIX) 20 MG tablet  2. Which pharmacy/location (including street and city if local pharmacy) is medication to be sent to? [id number] - [place], [place] - [address]  3. Do they need a 30 day or 90 day supply?   90 day supply  Patient is completely out of medication.

## 2022-10-26 NOTE — Telephone Encounter (Signed)
Sent refill of lasix 20 mg daily.  Will confirm what does to maintain on at hospital follow up appointment with Dr. Clifton James on 10/30/22.  Called patient's wife and let her know prescription refill sent.

## 2022-10-30 ENCOUNTER — Ambulatory Visit: Payer: Medicare HMO | Attending: Cardiovascular Disease | Admitting: Cardiovascular Disease

## 2022-10-30 ENCOUNTER — Encounter: Payer: Self-pay | Admitting: Cardiovascular Disease

## 2022-10-30 VITALS — BP 104/60 | HR 73 | Ht 70.0 in | Wt 161.8 lb

## 2022-10-30 DIAGNOSIS — E785 Hyperlipidemia, unspecified: Secondary | ICD-10-CM

## 2022-10-30 DIAGNOSIS — I2510 Atherosclerotic heart disease of native coronary artery without angina pectoris: Secondary | ICD-10-CM

## 2022-10-30 DIAGNOSIS — I493 Ventricular premature depolarization: Secondary | ICD-10-CM | POA: Diagnosis not present

## 2022-10-30 DIAGNOSIS — I4729 Other ventricular tachycardia: Secondary | ICD-10-CM | POA: Diagnosis not present

## 2022-10-30 DIAGNOSIS — I255 Ischemic cardiomyopathy: Secondary | ICD-10-CM | POA: Diagnosis not present

## 2022-10-30 DIAGNOSIS — I10 Essential (primary) hypertension: Secondary | ICD-10-CM | POA: Diagnosis not present

## 2022-10-30 DIAGNOSIS — I5022 Chronic systolic (congestive) heart failure: Secondary | ICD-10-CM | POA: Diagnosis not present

## 2022-10-30 DIAGNOSIS — I340 Nonrheumatic mitral (valve) insufficiency: Secondary | ICD-10-CM | POA: Diagnosis not present

## 2022-10-30 MED ORDER — FUROSEMIDE 20 MG PO TABS: 20.0000 mg | ORAL_TABLET | Freq: Every day | ORAL | 3 refills | Status: DC

## 2022-10-30 NOTE — Progress Notes (Signed)
Chief Complaint  Patient presents with   Follow-up    Systolic CHF, CAD   History of Present Illness: 89 yo male with history of CAD s/p 5V CABG 1996, ischemic cardiomyopathy, chronic systolic CHF, sinus node dysfunction s/p pacemaker, LBBB, HTN, HLD, DM, mitral regurgitation, CKD, aortic valve insufficiency, NSVT and PVCs here today for cardiac follow up. Echo October 2015 with LVEF=35=40%, moderate LVH, trivial AI, moderate MR. Stress myoview May 2014 without ischemia, lateral scar. Cardiac monitor 2015 with PVCs. He refused to consider ICD placement. He was seen in March 2019 by Norma Fredrickson, NP and described a syncopal event at home. He refused any workup. Echo September 2019 with LVEF=25-30%, severe LVH. Mild AI, moderate MR. He had a left lobectomy as a teenager. He was admitted to [facility] July 2022 with minimal troponin elevation. He was found to have pneumonia and Covid 19. His hospital course was complicated by severe delirium. He had NSVT and was placed on amiodarone. Echo July 2022 with LVEF=45-50% with moderate MR. He was admitted to [facility] in November 2022 with bradycardia/sinus node dysfunction and had a pacemaker placed. He has not tolerated beta blockers and has not been on an ARB due to renal insufficiency. He was seen in the ED at [facility] on 10/18/22 with dyspnea and weight gain/LE edema and was found to be volume overloaded. He was given IV Lasix with good diuresis and discharged home with plans to take Lasix 20 mg po daily. He takes amiodarone with history of PVCs and NSVT.   He is here today for follow up. The patient denies any chest pain, dyspnea, palpitations, lower extremity edema, orthopnea, PND, dizziness, near syncope or syncope.    Primary Care Physician: Corwin Levins, MD  Past Medical History:  Diagnosis Date   Aortic insufficiency    Coronary artery disease    coronary artery bypass graft x 5 in 1996   Diabetes mellitus    Hypercholesterolemia    non-insulin  dependent   Hypertension    Ischemic cardiomyopathy    Mitral regurgitation    PVC (premature ventricular contraction)    Sinus pause 12/07/2020   s/p PPM    Past Surgical History:  Procedure Laterality Date   CORONARY ARTERY BYPASS GRAFT     LUNG SURGERY     OTHER SURGICAL HISTORY     percutaneous coronary intervention of the  posterolateral segment on 01/27/1998   PACEMAKER IMPLANT N/A 12/07/2020   Procedure: PACEMAKER IMPLANT;  Surgeon: Regan Lemming, MD;  Location: [facility];  Service: Cardiovascular;  Laterality: N/A;    Current Outpatient Medications  Medication Sig Dispense Refill   acetaminophen (TYLENOL) 325 MG tablet Take 650 mg by mouth every 6 (six) hours as needed for moderate pain or headache.     albuterol (VENTOLIN HFA) 108 (90 Base) MCG/ACT inhaler Inhale 2 puffs into the lungs every 6 (six) hours as needed for wheezing or shortness of breath. 8 g 0   amiodarone (PACERONE) 200 MG tablet Take 1 tablet (200 mg total) by mouth daily. 90 tablet 3   amLODipine (NORVASC) 5 MG tablet Take 1 tablet (5 mg total) by mouth daily. 90 tablet 3   Ascorbic Acid (VITAMIN C) 1000 MG tablet Take 1,000 mg by mouth daily.     aspirin EC 81 MG tablet Take 1 tablet (81 mg total) by mouth daily. Swallow whole. 30 tablet 11   bisacodyl (DULCOLAX) 10 MG suppository Place 10 mg rectally as  needed for moderate constipation.     Cholecalciferol (VITAMIN D3) 10 MCG (400 UNIT) tablet Take 400 Units by mouth daily.     Cinnamon 500 MG capsule Take 1,000 mg by mouth daily.     diclofenac Sodium (VOLTAREN) 1 % GEL Apply 2 g topically 4 (four) times daily. 150 g 0   diphenhydramine-acetaminophen (TYLENOL PM) 25-500 MG TABS tablet Take 2 tablets by mouth at bedtime.     docusate sodium (COLACE) 250 MG capsule Take 250 mg by mouth daily as needed for mild constipation.     doxazosin (CARDURA) 2 MG tablet TAKE 1 TABLET BY MOUTH AT BEDTIME. 90 tablet 2   ondansetron (ZOFRAN) 4 MG tablet  Take 1 tablet (4 mg total) by mouth every 8 (eight) hours as needed for nausea or vomiting. 20 tablet 0   oxybutynin (DITROPAN) 5 MG tablet TAKE 1 TABLET EVERY 8 HOURS AS NEEDED FOR UP TO 14 DAYS FOR BLADDER SPASMS. 270 tablet 2   predniSONE (DELTASONE) 10 MG tablet 3 tabs by mouth per day for 3 days,2tabs per day for 3 days,1tab per day for 3 days 18 tablet 0   QUEtiapine (SEROQUEL) 50 MG tablet Take by mouth.     rosuvastatin (CRESTOR) 20 MG tablet TAKE 1 TABLET BY MOUTH EVERY DAY 90 tablet 2   tamsulosin (FLOMAX) 0.4 MG CAPS capsule TAKE 1 CAPSULE BY MOUTH EVERY DAY AFTER SUPPER 90 capsule 3   traZODone (DESYREL) 50 MG tablet TAKE 1/2 TO 1 TABLET BY MOUTH AT BEDTIME AS NEEDED FOR SLEEP 90 tablet 1   vitamin E 180 MG (400 UNITS) capsule Take 400 Units by mouth daily.     furosemide (LASIX) 20 MG tablet Take 1 tablet (20 mg total) by mouth daily. 90 tablet 3   No current facility-administered medications for this visit.    No Known Allergies  Social History   Socioeconomic History   Marital status: Married    Spouse name: Not on file   Number of children: 1   Years of education: Not on file   Highest education level: Not on file  Occupational History   Occupation: Runs a Science writer: SELF-EMPLOYED  Tobacco Use   Smoking status: Never   Smokeless tobacco: Never  Vaping Use   Vaping status: Never Used  Substance and Sexual Activity   Alcohol use: No   Drug use: No   Sexual activity: Not Currently  Other Topics Concern   Not on file  Social History Narrative   Not on file   Social Determinants of Health   Financial Resource Strain: Low Risk  (08/02/2021)   Overall Financial Resource Strain (CARDIA)    Difficulty of Paying Living Expenses: Not hard at all  Food Insecurity: No Food Insecurity (08/02/2021)   Hunger Vital Sign    Worried About Running Out of Food in the Last Year: Never true    Ran Out of Food in the Last Year: Never true  Transportation Needs:  No Transportation Needs (08/02/2021)   PRAPARE - Administrator, Civil Service (Medical): No    Lack of Transportation (Non-Medical): No  Physical Activity: Sufficiently Active (08/02/2021)   Exercise Vital Sign    Days of Exercise per Week: 5 days    Minutes of Exercise per Session: 30 min  Stress: No Stress Concern Present (08/02/2021)   [employer] of Occupational Health - Occupational Stress Questionnaire    Feeling of Stress : Not at all  Social Connections: Socially Integrated (08/02/2021)   Social Connection and Isolation Panel [NHANES]    Frequency of Communication with Friends and Family: More than three times a week    Frequency of Social Gatherings with Friends and Family: More than three times a week    Attends Religious Services: More than 4 times per year    Active Member of [employer] or Organizations: Yes    Attends Engineer, structural: More than 4 times per year    Marital Status: Married  Catering manager Violence: Not At Risk (08/02/2021)   Humiliation, Afraid, Rape, and Kick questionnaire    Fear of Current or Ex-Partner: No    Emotionally Abused: No    Physically Abused: No    Sexually Abused: No    Family History  Adopted: Yes  Problem Relation Age of Onset   CAD Neg Hx     Review of Systems:  As stated in the HPI and otherwise negative.   BP 104/60   Pulse 73   Ht 5\' 10"  (1.778 m)   Wt 73.4 kg   SpO2 97%   BMI 23.22 kg/m   Physical Examination: General: Well developed, well nourished, NAD  HEENT: OP clear, mucus membranes moist  SKIN: warm, dry. No rashes. Neuro: No focal deficits  Musculoskeletal: Muscle strength 5/5 all ext  Psychiatric: Mood and affect normal  Neck: No JVD, no carotid bruits, no thyromegaly, no lymphadenopathy.  Lungs: Scattered coarse breath sounds in upper lungs. No wheezes, rhonci, crackles Cardiovascular: Regular rate and rhythm. Systolic murmur.  Abdomen:Soft. Bowel sounds present. Non-tender.   Extremities: No lower extremity edema.   Echo July 2022:  1. Left ventricular ejection fraction, by estimation, is 45 to 50%. The  left ventricle has mildly decreased function. Left ventricular endocardial  border not optimally defined to evaluate regional wall motion. There is  severe asymmetric left ventricular   hypertrophy of the septal segment. Left ventricular diastolic parameters  are indeterminate.   2. Right ventricular systolic function is normal. The right ventricular  size is normal. Tricuspid regurgitation signal is inadequate for assessing  PA pressure.   3. Left atrial size was mildly dilated.   4. The mitral valve is abnormal. Moderate mitral valve regurgitation. No  evidence of mitral stenosis.   5. Tricuspid valve regurgitation is mild to moderate.   6. The aortic valve is tricuspid. There is mild calcification of the  aortic valve. There is mild thickening of the aortic valve.   7. Aortic dilatation noted. There is mild dilatation of the aortic root,  measuring 40 mm.  EKG:  EKG is not ordered today. The ekg ordered today demonstrates   Recent Labs: 10/12/2022: ALT 15; TSH 1.97 10/17/2022: B Natriuretic Peptide 2,431.6; BUN 27; Creatinine, Ser 1.49; Hemoglobin 11.2; Platelets 208; Potassium 4.1; Sodium 138   Lipid Panel    Component Value Date/Time   CHOL 86 10/12/2022 1442   CHOL 98 (L) 12/01/2018 0808   TRIG 69.0 10/12/2022 1442   TRIG 102 10/22/2005 0730   HDL 53.30 10/12/2022 1442   HDL 41 12/01/2018 0808   CHOLHDL 2 10/12/2022 1442   VLDL 13.8 10/12/2022 1442   LDLCALC 18 10/12/2022 1442   LDLCALC 44 07/17/2019 1022     Wt Readings from Last 3 Encounters:  10/30/22 73.4 kg  10/17/22 78.9 kg  10/12/22 78.9 kg    Assessment and Plan:   1. CAD without angina: No chest pain. Continue ASA and statin.    2.  Cardiomyopathy, ischemic: LVEF=45-50% by echo July 2022. He has not been on a beta blocker due to intolerance and has not been on an ARB due  to renal insufficiency. Will repeat echo now   3. Hyperlipidemia: LDL at goal in October 2024. Continue statin.     4. LBBB: Chronic.   5. HTN: BP is well controlled. No changes  6. Chronic systolic CHF: Volume status ok today. Weight is back down to baseline following recent ED visit with dyspnea and volume overload and daily use of Lasix. Continue Lasix 20 mg daily. BMET today  7. Sinus node dysfunction: Pacemaker in place  8. Mitral regurgitation: Moderate by echo in July 2022. Will repeat echo now.   Labs/ tests ordered today include:   Orders Placed This Encounter  Procedures   Basic Metabolic Panel (BMET)   ECHOCARDIOGRAM COMPLETE   Disposition:   F/U with me in 3 months  Signed, Verne Carrow, MD 10/30/2022 3:10 PM    [facility] [address] [place], [place], [place]  [address] Phone: [phone]; Fax: [phone]

## 2022-10-30 NOTE — Patient Instructions (Signed)
Medication Instructions:  No changes - continue furosemide (Lasix) 20 mg - one tablet daily *If you need a refill on your cardiac medications before your next appointment, please call your pharmacy*   Lab Work:  If you have labs (blood work) drawn today and your tests are completely normal, you will receive your results only by: MyChart Message (if you have MyChart) OR A paper copy in the mail If you have any lab test that is abnormal or we need to change your treatment, we will call you to review the results.   Testing/Procedures: Your physician has requested that you have an echocardiogram. Echocardiography is a painless test that uses sound waves to create images of your heart. It provides your doctor with information about the size and shape of your heart and how well your heart's chambers and valves are working. This procedure takes approximately one hour. There are no restrictions for this procedure. Please do NOT wear cologne, perfume, aftershave, or lotions (deodorant is allowed). Please arrive 15 minutes prior to your appointment time.    Follow-Up: At [facility], you and your health needs are our priority.  As part of our continuing mission to provide you with exceptional heart care, we have created designated Provider Care Teams.  These Care Teams include your primary Cardiologist (physician) and Advanced Practice Providers (APPs -  Physician Assistants and Nurse Practitioners) who all work together to provide you with the care you need, when you need it.   Your next appointment:   3 month(s)  Provider:   Verne Carrow, MD  or Advanced Practice Provider (NP or PA-C)

## 2022-10-31 LAB — BASIC METABOLIC PANEL
BUN/Creatinine Ratio: 23 (ref 10–24)
BUN: 30 mg/dL — ABNORMAL HIGH (ref 8–27)
CO2: 24 mmol/L (ref 20–29)
Calcium: 8.8 mg/dL (ref 8.6–10.2)
Chloride: 102 mmol/L (ref 96–106)
Creatinine, Ser: 1.32 mg/dL — ABNORMAL HIGH (ref 0.76–1.27)
Glucose: 128 mg/dL — ABNORMAL HIGH (ref 70–99)
Potassium: 4.5 mmol/L (ref 3.5–5.2)
Sodium: 141 mmol/L (ref 134–144)
eGFR: 52 mL/min/{1.73_m2} — ABNORMAL LOW (ref >59)

## 2022-11-09 DIAGNOSIS — R338 Other retention of urine: Secondary | ICD-10-CM | POA: Diagnosis not present

## 2022-11-26 ENCOUNTER — Ambulatory Visit (HOSPITAL_COMMUNITY): Payer: Medicare HMO | Attending: Cardiovascular Disease

## 2022-11-26 DIAGNOSIS — I340 Nonrheumatic mitral (valve) insufficiency: Secondary | ICD-10-CM | POA: Diagnosis not present

## 2022-11-26 LAB — ECHOCARDIOGRAM COMPLETE
Area-P 1/2: 4.01 cm2
P 1/2 time: 271 ms
S' Lateral: 4.9 cm

## 2022-11-30 ENCOUNTER — Other Ambulatory Visit: Payer: Self-pay | Admitting: Internal Medicine

## 2022-12-03 ENCOUNTER — Other Ambulatory Visit: Payer: Self-pay

## 2022-12-07 ENCOUNTER — Ambulatory Visit: Payer: Medicare HMO

## 2022-12-07 DIAGNOSIS — I255 Ischemic cardiomyopathy: Secondary | ICD-10-CM | POA: Diagnosis not present

## 2022-12-07 LAB — CUP PACEART REMOTE DEVICE CHECK
Battery Remaining Longevity: 155 mo
Battery Voltage: 3.04 V
Brady Statistic AP VP Percent: 0.09 %
Brady Statistic AP VS Percent: 4.22 %
Brady Statistic AS VP Percent: 0.16 %
Brady Statistic AS VS Percent: 95.53 %
Brady Statistic RA Percent Paced: 5.02 %
Brady Statistic RV Percent Paced: 0.25 %
Date Time Interrogation Session: 20241128192027
Implantable Lead Connection Status: 753985
Implantable Lead Connection Status: 753985
Implantable Lead Implant Date: 20221130
Implantable Lead Implant Date: 20221130
Implantable Lead Location: 753859
Implantable Lead Location: 753860
Implantable Lead Model: 5076
Implantable Lead Model: 5076
Implantable Pulse Generator Implant Date: 20221130
Lead Channel Impedance Value: 266 Ohm
Lead Channel Impedance Value: 399 Ohm
Lead Channel Impedance Value: 437 Ohm
Lead Channel Impedance Value: 589 Ohm
Lead Channel Pacing Threshold Amplitude: 0.625 V
Lead Channel Pacing Threshold Amplitude: 0.625 V
Lead Channel Pacing Threshold Pulse Width: 0.4 ms
Lead Channel Pacing Threshold Pulse Width: 0.4 ms
Lead Channel Sensing Intrinsic Amplitude: 1.625 mV
Lead Channel Sensing Intrinsic Amplitude: 1.625 mV
Lead Channel Sensing Intrinsic Amplitude: 10 mV
Lead Channel Sensing Intrinsic Amplitude: 10 mV
Lead Channel Setting Pacing Amplitude: 1.5 V
Lead Channel Setting Pacing Amplitude: 2 V
Lead Channel Setting Pacing Pulse Width: 0.4 ms
Lead Channel Setting Sensing Sensitivity: 0.9 mV
Zone Setting Status: 755011
Zone Setting Status: 755011

## 2022-12-14 DIAGNOSIS — R338 Other retention of urine: Secondary | ICD-10-CM | POA: Diagnosis not present

## 2022-12-22 ENCOUNTER — Other Ambulatory Visit: Payer: Self-pay | Admitting: Internal Medicine

## 2022-12-27 NOTE — Progress Notes (Signed)
Remote pacemaker transmission.   

## 2023-01-14 DIAGNOSIS — R338 Other retention of urine: Secondary | ICD-10-CM | POA: Diagnosis not present

## 2023-01-29 NOTE — Progress Notes (Deleted)
Cardiology Office Note:  .   Date:  01/29/2023  ID:  [name], DOB 06-27-1933, MRN [id number] PCP: Corwin Levins, MD  [facility] Providers Cardiologist:  Verne Carrow, MD { Click to update primary MD,subspecialty MD or APP then REFRESH:1}   Patient Profile: .      PMH Coronary artery disease S/p CABG x 5 in 1996 Ischemic cardiomyopathy/Chronic HFrEF Sinus node dysfunction s/p PPM implant LBBB Type 2 DM CKD Hypertension Mitral regurgitation Aortic valve insufficiency NSVT PVCs Left lobectomy as a teenager  He underwent CABG x 5 in 1996.  Evidence of lateral scar on stress Myoview May 2014, no ischemia.  Echocardiogram October 2015 with LVEF 35 to 40%, moderate LVH, trivial AI, moderate MR.  Cardiac monitor 2015 with PVCs.  He refused to consider ICD placement.  Seen by Norma Fredrickson, NP March 2019 and described a syncopal event at home.  He refused any further workup.  Echocardiogram September 2019 with LV EF 25 to 30%, severe LVH, mild AI, moderate MR.  Admission July 2022 with minimal troponin elevation.  He was found to have pneumonia and COVID-19.  His hospitalization was complicated by severe delirium. He had NSVT and was placed on amiodarone. Echo July 2022 with LVEF 45 to 50%, severe LVH, and moderate MR.   In November 2022 he was admitted with bradycardia/sinus node dysfunction and had a pacemaker placed by Dr. Elberta Fortis.  He has not tolerated beta-blockers and has not been on ARB due to renal insufficiency. .  Admission 10/18/2022 with dyspnea and weight gain/LE edema and was found to be volume overloaded.  He had good diuresis with IV Lasix and was discharged home with plans to take Lasix 20 mg daily.  He remained on amiodarone for history of PVCs and NSVT.  Last cardiology clinic visit was 10/30/2022 with Dr. Clifton James.  His weight was stable.  Echocardiogram ordered and completed 11/26/2022 which revealed reduced LVEF 30 to 35%, moderate LVH, G2 DD,  elevated left atrial pressure, severe LAE, moderate RAE, severe mitral regurgitation, mild to moderate TR, mild to moderate AI.        History of Present Illness: .   Philip Kerr is a *** 89 y.o. male who is here today for follow-up of HFrEF.   Discussed the use of AI scribe software for clinical note transcription with the patient, who gave verbal consent to proceed.   ROS: ***       Studies Reviewed: .        *** Risk Assessment/Calculations:   {Does this patient have ATRIAL FIBRILLATION?:[id number]} No BP recorded.  {Refresh Note OR Click here to enter BP  :1}***       Physical Exam:   VS:  There were no vitals taken for this visit.   Wt Readings from Last 3 Encounters:  10/30/22 161 lb 12.8 oz (73.4 kg)  10/17/22 173 lb 15.1 oz (78.9 kg)  10/12/22 174 lb (78.9 kg)    GEN: Well nourished, well developed in no acute distress NECK: No JVD; No carotid bruits CARDIAC: ***RRR, no murmurs, rubs, gallops RESPIRATORY:  Clear to auscultation without rales, wheezing or rhonchi  ABDOMEN: Soft, non-tender, non-distended EXTREMITIES:  No edema; No deformity     ASSESSMENT AND PLAN: .    Chronic HFrEF/ICM     {Are you ordering a CV Procedure (e.g. stress test, cath, DCCV, TEE, etc)?   Press F2        :[id number]}  Dispo: ***  Signed,  Eligha Bridegroom, NP-C

## 2023-01-31 ENCOUNTER — Ambulatory Visit: Payer: Medicare HMO | Attending: Nurse Practitioner | Admitting: Nurse Practitioner

## 2023-02-01 ENCOUNTER — Ambulatory Visit (INDEPENDENT_AMBULATORY_CARE_PROVIDER_SITE_OTHER): Payer: Medicare HMO

## 2023-02-01 DIAGNOSIS — Z0000 Encounter for general adult medical examination without abnormal findings: Secondary | ICD-10-CM

## 2023-02-01 NOTE — Progress Notes (Signed)
Subjective:   Philip Kerr is a 89 y.o. male who presents for Medicare Annual/Subsequent preventive examination.  Visit Complete: Virtual I connected with  [name] on 02/01/23 by a audio enabled telemedicine application and verified that I am speaking with the correct person using two identifiers. Wife also on call. Interactive audio and video telecommunications were attempted between this provider and patient, however failed, due to patient having technical difficulties OR patient did not have access to video capability.  We continued and completed visit with audio only.  Patient Location: Home  Provider Location: Home Office  I discussed the limitations of evaluation and management by telemedicine. The patient expressed understanding and agreed to proceed.  Vital Signs: Because this visit was a virtual/telehealth visit, some criteria may be missing or patient reported. Any vitals not documented were not able to be obtained and vitals that have been documented are patient reported.    Cardiac Risk Factors include: advanced age (>35men, >67 women);diabetes mellitus;hypertension;male gender     Objective:    Today's Vitals   There is no height or weight on file to calculate BMI.     02/01/2023    2:03 PM 10/17/2022    4:40 PM 08/02/2021   10:57 AM 05/23/2021    5:24 PM 08/02/2020   11:31 AM 07/22/2020    6:06 AM 11/27/2016   10:29 AM  Advanced Directives  Does Patient Have a Medical Advance Directive? Yes No Yes No No No No  Type of Advance Directive Out of facility DNR (pink MOST or yellow form)  Out of facility DNR (pink MOST or yellow form)      Would patient like information on creating a medical advance directive?     No - Patient declined  No - Patient declined    Current Medications (verified) Outpatient Encounter Medications as of 02/01/2023  Medication Sig   acetaminophen (TYLENOL) 325 MG tablet Take 650 mg by mouth every 6 (six) hours as needed for  moderate pain or headache.   albuterol (VENTOLIN HFA) 108 (90 Base) MCG/ACT inhaler TAKE 2 PUFFS BY MOUTH EVERY 6 HOURS AS NEEDED FOR WHEEZE OR SHORTNESS OF BREATH   amiodarone (PACERONE) 200 MG tablet Take 1 tablet (200 mg total) by mouth daily.   amLODipine (NORVASC) 5 MG tablet Take 1 tablet (5 mg total) by mouth daily.   Ascorbic Acid (VITAMIN C) 1000 MG tablet Take 1,000 mg by mouth daily.   aspirin EC 81 MG tablet Take 1 tablet (81 mg total) by mouth daily. Swallow whole.   bisacodyl (DULCOLAX) 10 MG suppository Place 10 mg rectally as needed for moderate constipation.   Cholecalciferol (VITAMIN D3) 10 MCG (400 UNIT) tablet Take 400 Units by mouth daily.   Cinnamon 500 MG capsule Take 1,000 mg by mouth daily.   diclofenac Sodium (VOLTAREN) 1 % GEL Apply 2 g topically 4 (four) times daily.   diphenhydramine-acetaminophen (TYLENOL PM) 25-500 MG TABS tablet Take 2 tablets by mouth at bedtime.   docusate sodium (COLACE) 250 MG capsule Take 250 mg by mouth daily as needed for mild constipation.   doxazosin (CARDURA) 2 MG tablet TAKE 1 TABLET BY MOUTH AT BEDTIME.   furosemide (LASIX) 20 MG tablet Take 1 tablet (20 mg total) by mouth daily.   ondansetron (ZOFRAN) 4 MG tablet Take 1 tablet (4 mg total) by mouth every 8 (eight) hours as needed for nausea or vomiting.   oxybutynin (DITROPAN) 5 MG tablet TAKE 1 TABLET EVERY 8  HOURS AS NEEDED FOR UP TO 14 DAYS FOR BLADDER SPASMS.   predniSONE (DELTASONE) 10 MG tablet 3 tabs by mouth per day for 3 days,2tabs per day for 3 days,1tab per day for 3 days   QUEtiapine (SEROQUEL) 50 MG tablet TAKE 1/2 TAB BY MOUTH IN THE AM, AND 1 TAB AT BEDTIME   rosuvastatin (CRESTOR) 20 MG tablet TAKE 1 TABLET BY MOUTH EVERY DAY   tamsulosin (FLOMAX) 0.4 MG CAPS capsule TAKE 1 CAPSULE BY MOUTH EVERY DAY AFTER SUPPER   traZODone (DESYREL) 50 MG tablet TAKE 1/2 TO 1 TABLET BY MOUTH AT BEDTIME AS NEEDED FOR SLEEP   vitamin E 180 MG (400 UNITS) capsule Take 400 Units by mouth  daily.   No facility-administered encounter medications on file as of 02/01/2023.    Allergies (verified) Patient has no known allergies.   History: Past Medical History:  Diagnosis Date   Aortic insufficiency    Coronary artery disease    coronary artery bypass graft x 5 in 1996   Diabetes mellitus    Hypercholesterolemia    non-insulin dependent   Hypertension    Ischemic cardiomyopathy    Mitral regurgitation    PVC (premature ventricular contraction)    Sinus pause 12/07/2020   s/p PPM   Past Surgical History:  Procedure Laterality Date   CORONARY ARTERY BYPASS GRAFT     LUNG SURGERY     OTHER SURGICAL HISTORY     percutaneous coronary intervention of the  posterolateral segment on 01/27/1998   PACEMAKER IMPLANT N/A 12/07/2020   Procedure: PACEMAKER IMPLANT;  Surgeon: Regan Lemming, MD;  Location: [facility];  Service: Cardiovascular;  Laterality: N/A;   Family History  Adopted: Yes  Problem Relation Age of Onset   CAD Neg Hx    Social History   Socioeconomic History   Marital status: Married    Spouse name: Not on file   Number of children: 1   Years of education: Not on file   Highest education level: Not on file  Occupational History   Occupation: Runs a Science writer: SELF-EMPLOYED  Tobacco Use   Smoking status: Never   Smokeless tobacco: Never  Vaping Use   Vaping status: Never Used  Substance and Sexual Activity   Alcohol use: Not Currently   Drug use: No   Sexual activity: Not Currently  Other Topics Concern   Not on file  Social History Narrative   Not on file   Social Drivers of Health   Financial Resource Strain: Low Risk  (02/01/2023)   Overall Financial Resource Strain (CARDIA)    Difficulty of Paying Living Expenses: Not hard at all  Food Insecurity: No Food Insecurity (02/01/2023)   Hunger Vital Sign    Worried About Running Out of Food in the Last Year: Never true    Ran Out of Food in the Last Year:  Never true  Transportation Needs: No Transportation Needs (02/01/2023)   PRAPARE - Administrator, Civil Service (Medical): No    Lack of Transportation (Non-Medical): No  Physical Activity: Sufficiently Active (02/01/2023)   Exercise Vital Sign    Days of Exercise per Week: 7 days    Minutes of Exercise per Session: 30 min  Stress: No Stress Concern Present (02/01/2023)   [employer] of Occupational Health - Occupational Stress Questionnaire    Feeling of Stress : Not at all  Social Connections: Unknown (02/01/2023)   Social Connection and  Isolation Panel [NHANES]    Frequency of Communication with Friends and Family: Never    Frequency of Social Gatherings with Friends and Family: Not on file    Attends Religious Services: Never    Database administrator or Organizations: No    Attends Engineer, structural: Never    Marital Status: Married    Tobacco Counseling Counseling given: Not Answered   Clinical Intake:  Pre-visit preparation completed: Yes  Pain : No/denies pain     Nutritional Risks: None Diabetes: Yes CBG done?: No Did pt. bring in CBG monitor from home?: No  How often do you need to have someone help you when you read instructions, pamphlets, or other written materials from your doctor or pharmacy?: 1 - Never  Interpreter Needed?: No  Information entered by :: NAllen LPN   Activities of Daily Living    02/01/2023    1:58 PM  In your present state of health, do you have any difficulty performing the following activities:  Hearing? 1  Vision? 0  Difficulty concentrating or making decisions? 1  Comment dementia  Walking or climbing stairs? 0  Dressing or bathing? 0  Doing errands, shopping? 1  Comment wife with him  Preparing Food and eating ? N  Using the Toilet? N  In the past six months, have you accidently leaked urine? N  Comment has a catheter  Do you have problems with loss of bowel control? N  Managing your  Medications? N  Managing your Finances? Y  Housekeeping or managing your Housekeeping? N    Patient Care Team: Corwin Levins, MD as PCP - General (Internal Medicine) Kathleene Hazel, MD as PCP - Cardiology (Cardiology) Kathleene Hazel, MD as Consulting Physician (Cardiology)  Indicate any recent Medical Services you may have received from other than [facility] providers in the past year (date may be approximate).     Assessment:   This is a routine wellness examination for Philip Kerr.  Hearing/Vision screen Hearing Screening - Comments:: Trouble hearing, but no hearing aids Vision Screening - Comments:: No regular eye exams   Goals Addressed             This Visit's Progress    Patient Stated       02/01/2023, stay healthy       Depression Screen    02/01/2023    2:05 PM 10/12/2022    2:06 PM 06/13/2022    3:21 PM 08/02/2021   10:56 AM 05/12/2021    3:53 PM 08/29/2020    4:16 PM 08/12/2020    3:19 PM  PHQ 2/9 Scores  PHQ - 2 Score  0 0 0 0  0  Exception Documentation Medical reason     Patient refusal     Fall Risk    02/01/2023    2:04 PM 10/12/2022    2:06 PM 06/13/2022    3:21 PM 08/02/2021   10:51 AM 05/12/2021    3:53 PM  Fall Risk   Falls in the past year? 0 0 0 0 1  Number falls in past yr: 0 0 0 0 1  Injury with Fall? 0 0 0 0 0  Risk for fall due to : Medication side effect No Fall Risks No Fall Risks No Fall Risks   Follow up Falls prevention discussed;Falls evaluation completed Falls evaluation completed Falls evaluation completed Falls evaluation completed     MEDICARE RISK AT HOME: Medicare Risk at Home Any stairs in or around  the home?: No (has a ramp) If so, are there any without handrails?: No Home free of loose throw rugs in walkways, pet beds, electrical cords, etc?: Yes Adequate lighting in your home to reduce risk of falls?: Yes Life alert?: No Use of a cane, walker or w/c?: No Grab bars in the bathroom?: No Shower chair or bench in  shower?: Yes Elevated toilet seat or a handicapped toilet?: Yes  TIMED UP AND GO:  Was the test performed?  No    Cognitive Function:  6 CIT not administered. Patient has diagnosis or dementia.      08/02/2021   10:59 AM 11/27/2016   10:32 AM  MMSE - Mini Mental State Exam  Not completed: Unable to complete   Orientation to time  5  Orientation to Place  5  Registration  3  Attention/ Calculation  5  Recall  0  Language- name 2 objects  2  Language- repeat  1  Language- follow 3 step command  3  Language- read & follow direction  1  Write a sentence  1  Copy design  1  Total score  27        Immunizations Immunization History  Administered Date(s) Administered   Fluad Quad(high Dose 65+) 11/03/2020, 12/25/2021   Influenza, High Dose Seasonal PF 09/06/2017, 11/19/2018, 10/01/2022   Influenza-Unspecified 10/08/2016   PFIZER(Purple Top)SARS-COV-2 Vaccination 08/16/2019   PNEUMOCOCCAL CONJUGATE-20 08/24/2022   Pneumococcal Conjugate-13 07/17/2019   Tdap 05/22/2019    TDAP status: Up to date  Flu Vaccine status: Up to date  Pneumococcal vaccine status: Up to date  Covid-19 vaccine status: Declined, Education has been provided regarding the importance of this vaccine but patient still declined. Advised may receive this vaccine at local pharmacy or Health Dept.or vaccine clinic. Aware to provide a copy of the vaccination record if obtained from local pharmacy or Health Dept. Verbalized acceptance and understanding.  Qualifies for Shingles Vaccine? Yes   Zostavax completed No   Shingrix Completed?: No.    Education has been provided regarding the importance of this vaccine. Patient has been advised to call insurance company to determine out of pocket expense if they have not yet received this vaccine. Advised may also receive vaccine at local pharmacy or Health Dept. Verbalized acceptance and understanding.  Screening Tests Health Maintenance  Topic Date Due    OPHTHALMOLOGY EXAM  Never done   COVID-19 Vaccine (2 - Pfizer risk series) 09/06/2019   FOOT EXAM  11/03/2021   HEMOGLOBIN A1C  04/12/2023   Medicare Annual Wellness (AWV)  02/01/2024   DTaP/Tdap/Td (2 - Td or Tdap) 05/21/2029   Pneumonia Vaccine 80+ Years old  Completed   INFLUENZA VACCINE  Completed   HPV VACCINES  Aged Out   Zoster Vaccines- Shingrix  Discontinued    Health Maintenance  Health Maintenance Due  Topic Date Due   OPHTHALMOLOGY EXAM  Never done   COVID-19 Vaccine (2 - Pfizer risk series) 09/06/2019   FOOT EXAM  11/03/2021    Colorectal cancer screening: No longer required.   Lung Cancer Screening: (Low Dose CT Chest recommended if Age 8-80 years, 20 pack-year currently smoking OR have quit w/in 15years.) does not qualify.   Lung Cancer Screening Referral: no  Additional Screening:  Hepatitis C Screening: does not qualify;   Vision Screening: Recommended annual ophthalmology exams for early detection of glaucoma and other disorders of the eye. Is the patient up to date with their annual eye exam?  No  Who is the provider or what is the name of the office in which the patient attends annual eye exams? none If pt is not established with a provider, would they like to be referred to a provider to establish care? No .   Dental Screening: Recommended annual dental exams for proper oral hygiene  Diabetic Foot Exam: Diabetic Foot Exam: Overdue, Pt has been advised about the importance in completing this exam. Pt is scheduled for diabetic foot exam on next appointment.  Community Resource Referral / Chronic Care Management: CRR required this visit?  No   CCM required this visit?  No     Plan:     I have personally reviewed and noted the following in the patient's chart:   Medical and social history Use of alcohol, tobacco or illicit drugs  Current medications and supplements including opioid prescriptions. Patient is not currently taking opioid  prescriptions. Functional ability and status Nutritional status Physical activity Advanced directives List of other physicians Hospitalizations, surgeries, and ER visits in previous 12 months Vitals Screenings to include cognitive, depression, and falls Referrals and appointments  In addition, I have reviewed and discussed with patient certain preventive protocols, quality metrics, and best practice recommendations. A written personalized care plan for preventive services as well as general preventive health recommendations were provided to patient.     Barb Merino, LPN   1/61/0960   After Visit Summary: (Pick Up) Due to this being a telephonic visit, with patients personalized plan was offered to patient and patient has requested to Pick up at office.  Nurse Notes: none

## 2023-02-01 NOTE — Patient Instructions (Signed)
Philip Kerr , Thank you for taking time to come for your Medicare Wellness Visit. I appreciate your ongoing commitment to your health goals. Please review the following plan we discussed and let me know if I can assist you in the future.   Referrals/Orders/Follow-Ups/Clinician Recommendations: none  This is a list of the screening recommended for you and due dates:  Health Maintenance  Topic Date Due   Eye exam for diabetics  Never done   COVID-19 Vaccine (2 - Pfizer risk series) 09/06/2019   Complete foot exam   11/03/2021   Hemoglobin A1C  04/12/2023   Medicare Annual Wellness Visit  02/01/2024   DTaP/Tdap/Td vaccine (2 - Td or Tdap) 05/21/2029   Pneumonia Vaccine  Completed   Flu Shot  Completed   HPV Vaccine  Aged Out   Zoster (Shingles) Vaccine  Discontinued    Advanced directives: (In Chart) A copy of your advanced directives are scanned into your chart should your provider ever need it.  Next Medicare Annual Wellness Visit scheduled for next year: Yes  insert Preventive Care attachment Insert FALL PREVENTION attachment if needed

## 2023-02-05 NOTE — Progress Notes (Unsigned)
Cardiology Office Note    Patient Name: Philip Kerr Date of Encounter: 02/06/2023  Primary Care Provider:  Corwin Levins, MD Primary Cardiologist:  Verne Carrow, MD Primary Electrophysiologist: None   Past Medical History    Past Medical History:  Diagnosis Date   Aortic insufficiency    Coronary artery disease    coronary artery bypass graft x 5 in 1996   Diabetes mellitus    Hypercholesterolemia    non-insulin dependent   Hypertension    Ischemic cardiomyopathy    Mitral regurgitation    PVC (premature ventricular contraction)    Sinus pause 12/07/2020   s/p PPM    History of Present Illness  Philip Kerr is a 89 y.o. male with a PMH of CAD s/p CABG x 5 in 1996, ICM, chronic systolic CHF, sinus node dysfunction s/p PPM, LBBB, HTN, HLD, DM type II, mild aortic dilation, CKD, MR, and SVT and PVCs who presents today for 96-month follow-up.  Philip Kerr was previously followed by Dr. Riley Kill and is currently followed by Dr. Clifton James for management of CAD.  He has a PMH significant for CABG x 5 and was initially seen by Dr. Clifton James in 2015.  He completed previous 2D echo in 2014 that showed decreased EF of 35% with stress Myoview in 2014 showing no evidence of ischemia.  Was reluctant to complete further cardiac testing and refused ICD.  He was seen by Norma Fredrickson in 2019 a syncopal event but refused workup.  2D echo at that time showed decreased EF of 25-30% with severe LVH and mild AI with moderate MR.  He was seen in follow-up on 11/2019 with elevated BP and losartan was increased to 75 mg and patient self increase losartan to 100 mg for 2 months.  He was seen in the ED on 07/2020 with chest pain which he described as central and nonradiating.  He was found to be COVID-positive and hospitalization was complicated by severe delirium and experienced nonsustained VT and was placed on beta-blocker with 8-second pause noted. Echocardiogram during admission showed LVEF  improved to 45-50%, severe LVH, moderate MR. He was started on amiodarone 200 mg and was admitted on 11/2020 with sinus pauses underwent implant of Medtronic dual-chamber pacemaker on 12/07/2020.    He was last seen by Dr. Clifton James on 10/30/2022 following recent admission for dyspnea and weight gain and was managed for volume overload with IV Lasix.  During visit patient denied chest pain or shortness of breath.  He has not been on ARB's due to renal insufficiency and 2D echo was repeated that showed weekend EF of 30-35% with moderately decreased LV function and RWMA with moderately dilated LV and concentric LVH with grade 2 DD, severe MR moderately dilated RA and severely dilated LA with mild to moderate TVR and mild to moderate AVR with mild dilation of the ascending aorta (42 mm).  He was continued on Lasix 20 mg daily at that time.  Philip Kerr was seen today for 63-month follow-up.   During today's visit he reports feeling well overall and is active at home, doing exercises such as push-ups and walking about a mile daily. He denies any shortness of breath during these activities. He has a long-standing cough, which he reports has been present since he had a lung removed in his youth. He has noticed some weight gain, about nine pounds since his last visit in October. He denies any difficulty breathing when lying down and uses only one pillow  for sleep. He is currently taking Lasix daily and reports urinating two to three times a day. He is mindful of his fluid intake, keeping it around 64 ounces a day, and tries to limit his salt intake.   Patient denies chest pain, palpitations, dyspnea, PND, orthopnea, nausea, vomiting, dizziness, syncope, edema, weight gain, or early satiety.  Review of Systems  Please see the history of present illness.    All other systems reviewed and are otherwise negative except as noted above.  Physical Exam    Wt Readings from Last 3 Encounters:  02/06/23 170 lb 6.4 oz  (77.3 kg)  10/30/22 161 lb 12.8 oz (73.4 kg)  10/17/22 173 lb 15.1 oz (78.9 kg)   VS: Vitals:   02/06/23 1540  BP: (!) 118/58  Pulse: 80  SpO2: 97%  ,Body mass index is 24.45 kg/m. GEN: Well nourished, well developed in no acute distress Neck: No JVD; No carotid bruits Pulmonary: Clear to auscultation without rales, wheezing or rhonchi  Cardiovascular: Normal rate. Regular rhythm. Normal S1. Normal S2.   Murmurs: There is no murmur.  ABDOMEN: Soft, non-tender, non-distended EXTREMITIES:  No edema; No deformity   EKG/LABS/ Recent Cardiac Studies   ECG personally reviewed by me today -none completed today  Risk Assessment/Calculations:          Lab Results  Component Value Date   WBC 11.8 (H) 10/17/2022   HGB 11.2 (L) 10/17/2022   HCT 36.0 (L) 10/17/2022   MCV 101.1 (H) 10/17/2022   PLT 208 10/17/2022   Lab Results  Component Value Date   CREATININE 1.32 (H) 10/30/2022   BUN 30 (H) 10/30/2022   NA 141 10/30/2022   K 4.5 10/30/2022   CL 102 10/30/2022   CO2 24 10/30/2022   Lab Results  Component Value Date   CHOL 86 10/12/2022   HDL 53.30 10/12/2022   LDLCALC 18 10/12/2022   TRIG 69.0 10/12/2022   CHOLHDL 2 10/12/2022    Lab Results  Component Value Date   HGBA1C 6.4 10/12/2022   Assessment & Plan    1.  CAD: -s/p CABG x 5 in 1996  -Today patient reports no chest pain or angina since previous follow-up. -Continue current GDMT with ASA 81 mg, Crestor 20 mg  2.  HFrEF/ICM: -Today patient is stable and euvolemic on examination. -He reports some shortness of breath with exertion but denies any orthopnea. -Weight gain of approximately 9 pounds since last visit. No current symptoms of fluid overload. Patient is taking Lasix daily. -Increase Lasix to 40mg  daily for three days, then return to 20mg  daily. -Check BNP and BMP today and repeat in two weeks to monitor kidney function. -Low sodium diet, fluid restriction <2L, and daily weights encouraged. Educated  to contact our office for weight gain of 2 lbs overnight or 5 lbs in one week.   3.  Sinus node dysfunction: -s/p Medtronic PPM 2022 -Normal Paceart reports with battery and leads WNL  4.  Severe MR: -Previous 2D echo completed 11/2022 showing moderately reduced EF of 30-35% with moderately dilated LV and grade 2 DD with mild to moderate TVR and severe MVR -Patient reports no significant shortness of breath with stable weight. -Continue Lasix 20 mg daily  5.  HLD: -Patient's last LDL cholesterol was 18 -Continue Crestor 20 mg daily  6.  History of NSVT: -Patient reports no episodes of palpitations or sustained arrhythmia -Continue amiodarone 200 mg  Disposition: Follow-up with Verne Carrow, MD or APP in 6  months   Signed, Napoleon Form, Leodis Rains, NP 02/06/2023, 4:08 PM [facility]

## 2023-02-06 ENCOUNTER — Encounter: Payer: Self-pay | Admitting: Nurse Practitioner

## 2023-02-06 ENCOUNTER — Ambulatory Visit: Payer: Medicare HMO | Attending: Nurse Practitioner | Admitting: Nurse Practitioner

## 2023-02-06 VITALS — BP 118/58 | HR 80 | Ht 70.0 in | Wt 170.4 lb

## 2023-02-06 DIAGNOSIS — I340 Nonrheumatic mitral (valve) insufficiency: Secondary | ICD-10-CM | POA: Diagnosis not present

## 2023-02-06 DIAGNOSIS — Z950 Presence of cardiac pacemaker: Secondary | ICD-10-CM

## 2023-02-06 DIAGNOSIS — I495 Sick sinus syndrome: Secondary | ICD-10-CM

## 2023-02-06 DIAGNOSIS — I4729 Other ventricular tachycardia: Secondary | ICD-10-CM | POA: Diagnosis not present

## 2023-02-06 DIAGNOSIS — I5022 Chronic systolic (congestive) heart failure: Secondary | ICD-10-CM

## 2023-02-06 DIAGNOSIS — I255 Ischemic cardiomyopathy: Secondary | ICD-10-CM

## 2023-02-06 DIAGNOSIS — I2510 Atherosclerotic heart disease of native coronary artery without angina pectoris: Secondary | ICD-10-CM

## 2023-02-06 NOTE — Patient Instructions (Signed)
Medication Instructions:  Lasix 40 mg for 3 day then back to 20 mg  *If you need a refill on your cardiac medications before your next appointment, please call your pharmacy*   Lab Work: BMET BNP today  If you have labs (blood work) drawn today and your tests are completely normal, you will receive your results only by: MyChart Message (if you have MyChart) OR A paper copy in the mail If you have any lab test that is abnormal or we need to change your treatment, we will call you to review the results.   Testing/Procedures: None    Follow-Up: At [facility], you and your health needs are our priority.  As part of our continuing mission to provide you with exceptional heart care, we have created designated Provider Care Teams.  These Care Teams include your primary Cardiologist (physician) and Advanced Practice Providers (APPs -  Physician Assistants and Nurse Practitioners) who all work together to provide you with the care you need, when you need it.     Your next appointment:   6 month(s)  Provider:   Verne Carrow, MD     Other Instructions

## 2023-02-07 DIAGNOSIS — I340 Nonrheumatic mitral (valve) insufficiency: Secondary | ICD-10-CM | POA: Diagnosis not present

## 2023-02-07 DIAGNOSIS — I255 Ischemic cardiomyopathy: Secondary | ICD-10-CM | POA: Diagnosis not present

## 2023-02-07 DIAGNOSIS — I495 Sick sinus syndrome: Secondary | ICD-10-CM | POA: Diagnosis not present

## 2023-02-07 DIAGNOSIS — I5022 Chronic systolic (congestive) heart failure: Secondary | ICD-10-CM | POA: Diagnosis not present

## 2023-02-07 DIAGNOSIS — I2510 Atherosclerotic heart disease of native coronary artery without angina pectoris: Secondary | ICD-10-CM | POA: Diagnosis not present

## 2023-02-08 LAB — BASIC METABOLIC PANEL
BUN/Creatinine Ratio: 16 (ref 10–24)
BUN: 24 mg/dL (ref 8–27)
CO2: 24 mmol/L (ref 20–29)
Calcium: 9.3 mg/dL (ref 8.6–10.2)
Chloride: 103 mmol/L (ref 96–106)
Creatinine, Ser: 1.52 mg/dL — ABNORMAL HIGH (ref 0.76–1.27)
Glucose: 141 mg/dL — ABNORMAL HIGH (ref 70–99)
Potassium: 4.7 mmol/L (ref 3.5–5.2)
Sodium: 140 mmol/L (ref 134–144)
eGFR: 44 mL/min/{1.73_m2} — ABNORMAL LOW (ref >59)

## 2023-02-08 LAB — BRAIN NATRIURETIC PEPTIDE: BNP: 1161.7 pg/mL — ABNORMAL HIGH (ref 0.0–100.0)

## 2023-02-11 DIAGNOSIS — R338 Other retention of urine: Secondary | ICD-10-CM | POA: Diagnosis not present

## 2023-02-12 ENCOUNTER — Encounter: Payer: Self-pay | Admitting: Internal Medicine

## 2023-02-12 ENCOUNTER — Ambulatory Visit: Payer: Medicare HMO | Admitting: Internal Medicine

## 2023-02-12 VITALS — BP 110/62 | HR 93 | Temp 97.7°F | Ht 70.0 in | Wt 167.0 lb

## 2023-02-12 DIAGNOSIS — Z0000 Encounter for general adult medical examination without abnormal findings: Secondary | ICD-10-CM | POA: Diagnosis not present

## 2023-02-12 DIAGNOSIS — E119 Type 2 diabetes mellitus without complications: Secondary | ICD-10-CM | POA: Diagnosis not present

## 2023-02-12 DIAGNOSIS — Z0001 Encounter for general adult medical examination with abnormal findings: Secondary | ICD-10-CM

## 2023-02-12 DIAGNOSIS — J449 Chronic obstructive pulmonary disease, unspecified: Secondary | ICD-10-CM | POA: Diagnosis not present

## 2023-02-12 DIAGNOSIS — I10 Essential (primary) hypertension: Secondary | ICD-10-CM

## 2023-02-12 DIAGNOSIS — N1831 Chronic kidney disease, stage 3a: Secondary | ICD-10-CM

## 2023-02-12 DIAGNOSIS — E559 Vitamin D deficiency, unspecified: Secondary | ICD-10-CM

## 2023-02-12 DIAGNOSIS — H61102 Unspecified noninfective disorders of pinna, left ear: Secondary | ICD-10-CM

## 2023-02-12 LAB — CBC WITH DIFFERENTIAL/PLATELET
Basophils Absolute: 0.1 10*3/uL (ref 0.0–0.1)
Basophils Relative: 0.9 % (ref 0.0–3.0)
Eosinophils Absolute: 0.3 10*3/uL (ref 0.0–0.7)
Eosinophils Relative: 3.8 % (ref 0.0–5.0)
HCT: 38.6 % — ABNORMAL LOW (ref 39.0–52.0)
Hemoglobin: 12.7 g/dL — ABNORMAL LOW (ref 13.0–17.0)
Lymphocytes Relative: 38.3 % (ref 12.0–46.0)
Lymphs Abs: 3.2 10*3/uL (ref 0.7–4.0)
MCHC: 33 g/dL (ref 30.0–36.0)
MCV: 100.4 fL — ABNORMAL HIGH (ref 78.0–100.0)
Monocytes Absolute: 0.6 10*3/uL (ref 0.1–1.0)
Monocytes Relative: 7 % (ref 3.0–12.0)
Neutro Abs: 4.1 10*3/uL (ref 1.4–7.7)
Neutrophils Relative %: 50 % (ref 43.0–77.0)
Platelets: 213 10*3/uL (ref 150.0–400.0)
RBC: 3.84 Mil/uL — ABNORMAL LOW (ref 4.22–5.81)
RDW: 14.5 % (ref 11.5–15.5)
WBC: 8.3 10*3/uL (ref 4.0–10.5)

## 2023-02-12 LAB — HEPATIC FUNCTION PANEL
ALT: 10 U/L (ref 0–53)
AST: 14 U/L (ref 0–37)
Albumin: 3.9 g/dL (ref 3.5–5.2)
Alkaline Phosphatase: 56 U/L (ref 39–117)
Bilirubin, Direct: 0.1 mg/dL (ref 0.0–0.3)
Total Bilirubin: 0.4 mg/dL (ref 0.2–1.2)
Total Protein: 7.2 g/dL (ref 6.0–8.3)

## 2023-02-12 LAB — HEMOGLOBIN A1C: Hgb A1c MFr Bld: 6.9 % — ABNORMAL HIGH (ref 4.6–6.5)

## 2023-02-12 LAB — TSH: TSH: 1.68 u[IU]/mL (ref 0.35–5.50)

## 2023-02-12 LAB — MICROALBUMIN / CREATININE URINE RATIO
Creatinine,U: 167.8 mg/dL
Microalb Creat Ratio: 19.2 mg/g (ref 0.0–30.0)
Microalb, Ur: 32.2 mg/dL — ABNORMAL HIGH (ref 0.0–1.9)

## 2023-02-12 LAB — VITAMIN D 25 HYDROXY (VIT D DEFICIENCY, FRACTURES): VITD: 52.45 ng/mL (ref 30.00–100.00)

## 2023-02-12 LAB — LIPID PANEL
Cholesterol: 99 mg/dL (ref 0–200)
HDL: 40.3 mg/dL (ref >39.00)
LDL Cholesterol: 32 mg/dL (ref 0–99)
NonHDL: 58.71
Total CHOL/HDL Ratio: 2
Triglycerides: 133 mg/dL (ref 0.0–149.0)
VLDL: 26.6 mg/dL (ref 0.0–40.0)

## 2023-02-12 MED ORDER — TRELEGY ELLIPTA 100-62.5-25 MCG/ACT IN AEPB: 1.0000 | INHALATION_SPRAY | Freq: Every day | RESPIRATORY_TRACT | 11 refills | Status: DC

## 2023-02-12 NOTE — Progress Notes (Signed)
 The test results show that your current treatment is OK, as the tests are stable.  Please continue the same plan.  There is no other need for change of treatment or further evaluation based on these results, at this time.  thanks

## 2023-02-12 NOTE — Progress Notes (Signed)
 Patient ID: Philip Kerr, male   DOB: [birth year], 89 y.o.   MRN: [id number]         Chief Complaint:: wellness exam and Medical Management of Chronic Issues (4 month follow up)  With worsneing sod doe copd, left pinna lesion, dm, lo vit d, ckd3a       HPI:  Philip Kerr is a 89 y.o. male here for wellness exam; wife to call for eye exam for him soon, declines covid booster, o/w up to date                        Also Pt denies chest pain, wheezing, orthopnea, PND, increased LE swelling, palpitations, dizziness or syncope but has mild worsening sob doe and current in haler not covered by insurance.  .   Pt denies polydipsia, polyuria, or new focal neuro s/s.    Pt denies fever, wt loss, night sweats, loss of appetite, or other constitutional symptoms     Wt Readings from Last 3 Encounters:  02/12/23 167 lb (75.8 kg)  02/06/23 170 lb 6.4 oz (77.3 kg)  10/30/22 161 lb 12.8 oz (73.4 kg)   BP Readings from Last 3 Encounters:  02/12/23 110/62  02/06/23 (!) 118/58  10/30/22 104/60   Immunization History  Administered Date(s) Administered   Fluad Quad(high Dose 65+) 11/03/2020, 12/25/2021   Influenza, High Dose Seasonal PF 09/06/2017, 11/19/2018, 10/01/2022   Influenza-Unspecified 10/08/2016   PFIZER(Purple Top)SARS-COV-2 Vaccination 08/16/2019   PNEUMOCOCCAL CONJUGATE-20 08/24/2022   Pneumococcal Conjugate-13 07/17/2019   Tdap 05/22/2019   Health Maintenance Due  Topic Date Due   OPHTHALMOLOGY EXAM  Never done      Past Medical History:  Diagnosis Date   Aortic insufficiency    Coronary artery disease    coronary artery bypass graft x 5 in 1996   Diabetes mellitus    Hypercholesterolemia    non-insulin  dependent   Hypertension    Ischemic cardiomyopathy    Mitral regurgitation    PVC (premature ventricular contraction)    Sinus pause 12/07/2020   s/p PPM   Past Surgical History:  Procedure Laterality Date   CORONARY ARTERY BYPASS GRAFT     LUNG SURGERY     OTHER  SURGICAL HISTORY     percutaneous coronary intervention of the  posterolateral segment on 01/27/1998   PACEMAKER IMPLANT N/A 12/07/2020   Procedure: PACEMAKER IMPLANT;  Surgeon: Inocencio Soyla Lunger, MD;  Location: [facility];  Service: Cardiovascular;  Laterality: N/A;    reports that he has never smoked. He has never used smokeless tobacco. He reports that he does not currently use alcohol . He reports that he does not use drugs. family history is not on file. He was adopted. No Known Allergies Current Outpatient Medications on File Prior to Visit  Medication Sig Dispense Refill   acetaminophen  (TYLENOL ) 325 MG tablet Take 650 mg by mouth every 6 (six) hours as needed for moderate pain or headache.     amiodarone  (PACERONE ) 200 MG tablet Take 1 tablet (200 mg total) by mouth daily. 90 tablet 3   amLODipine  (NORVASC ) 5 MG tablet Take 1 tablet (5 mg total) by mouth daily. 90 tablet 3   Ascorbic Acid (VITAMIN C) 1000 MG tablet Take 1,000 mg by mouth daily.     aspirin  EC 81 MG tablet Take 1 tablet (81 mg total) by mouth daily. Swallow whole. 30 tablet 11   Cholecalciferol (VITAMIN D3) 10 MCG (400 UNIT)  tablet Take 400 Units by mouth daily.     Cinnamon 500 MG capsule Take 1,000 mg by mouth daily.     diclofenac  Sodium (VOLTAREN ) 1 % GEL Apply 2 g topically 4 (four) times daily. 150 g 0   diphenhydramine-acetaminophen  (TYLENOL  PM) 25-500 MG TABS tablet Take 2 tablets by mouth at bedtime.     docusate sodium  (COLACE) 250 MG capsule Take 250 mg by mouth daily as needed for mild constipation.     doxazosin  (CARDURA ) 2 MG tablet TAKE 1 TABLET BY MOUTH AT BEDTIME. 90 tablet 2   furosemide  (LASIX ) 20 MG tablet Take 1 tablet (20 mg total) by mouth daily. 90 tablet 3   ondansetron  (ZOFRAN ) 4 MG tablet Take 1 tablet (4 mg total) by mouth every 8 (eight) hours as needed for nausea or vomiting. 20 tablet 0   predniSONE  (DELTASONE ) 10 MG tablet 3 tabs by mouth per day for 3 days,2tabs per day for 3  days,1tab per day for 3 days 18 tablet 0   QUEtiapine  (SEROQUEL ) 50 MG tablet TAKE 1/2 TAB BY MOUTH IN THE AM, AND 1 TAB AT BEDTIME 135 tablet 3   rosuvastatin  (CRESTOR ) 20 MG tablet TAKE 1 TABLET BY MOUTH EVERY DAY 90 tablet 2   tamsulosin  (FLOMAX ) 0.4 MG CAPS capsule TAKE 1 CAPSULE BY MOUTH EVERY DAY AFTER SUPPER 90 capsule 3   traZODone  (DESYREL ) 50 MG tablet TAKE 1/2 TO 1 TABLET BY MOUTH AT BEDTIME AS NEEDED FOR SLEEP 90 tablet 1   vitamin E 180 MG (400 UNITS) capsule Take 400 Units by mouth daily.     No current facility-administered medications on file prior to visit.        ROS:  All others reviewed and negative.  Objective        PE:  BP 110/62 (BP Location: Right Arm, Patient Position: Sitting, Cuff Size: Normal)   Pulse 93   Temp 97.7 F (36.5 C) (Oral)   Ht 5' 10 (1.778 m)   Wt 167 lb (75.8 kg)   SpO2 98%   BMI 23.96 kg/m                 Constitutional: Pt appears in NAD               HENT: Head: NCAT.                Right Ear: External ear normal.                 Left Ear: External ear normal.                Eyes: . Pupils are equal, round, and reactive to light. Conjunctivae and EOM are normal               Nose: without d/c or deformity               Neck: Neck supple. Gross normal ROM               Cardiovascular: Normal rate and regular rhythm.                 Pulmonary/Chest: Effort normal and breath sounds without rales or wheezing.                Abd:  Soft, NT, ND, + BS, no organomegaly               Neurological: Pt is alert. At baseline orientation, motor grossly intact  Skin: Skin is warm. No rashes,  LE edema - none, left pinna with mid aspect edge with 10 mm dark lesion slight raised               Psychiatric: Pt behavior is normal without agitation   Micro: none  Cardiac tracings I have personally interpreted today:  none  Pertinent Radiological findings (summarize): none   Lab Results  Component Value Date   WBC 8.3 02/12/2023    HGB 12.7 (L) 02/12/2023   HCT 38.6 (L) 02/12/2023   PLT 213.0 02/12/2023   GLUCOSE 141 (H) 02/07/2023   CHOL 99 02/12/2023   TRIG 133.0 02/12/2023   HDL 40.30 02/12/2023   LDLCALC 32 02/12/2023   ALT 10 02/12/2023   AST 14 02/12/2023   NA 140 02/07/2023   K 4.7 02/07/2023   CL 103 02/07/2023   CREATININE 1.52 (H) 02/07/2023   BUN 24 02/07/2023   CO2 24 02/07/2023   TSH 1.68 02/12/2023   PSA 1.97 09/06/2017   INR 1.2 12/07/2020   HGBA1C 6.9 (H) 02/12/2023   MICROALBUR 32.2 (H) 02/12/2023   Assessment/Plan:  Philip Kerr is a 89 y.o. White or Caucasian [1] male with  has a past medical history of Aortic insufficiency, Coronary artery disease, Diabetes mellitus, Hypercholesterolemia, Hypertension, Ischemic cardiomyopathy, Mitral regurgitation, PVC (premature ventricular contraction), and Sinus pause (12/07/2020).  Encounter for well adult exam with abnormal findings Age and sex appropriate education and counseling updated with regular exercise and diet Referrals for preventative services - pt wife to call for eye exam soon Immunizations addressed - declines covid booster Smoking counseling  - none needed Evidence for depression or other mood disorder - none significant Most recent labs reviewed. I have personally reviewed and have noted: 1) the patient's medical and social history 2) The patient's current medications and supplements 3) The patient's height, weight, and BMI have been recorded in the chart   COPD (chronic obstructive pulmonary disease) (HCC) Mild uncontrolled, for change inhaler to trelegy 1 qd  Chronic kidney disease, stage 3a (HCC) Lab Results  Component Value Date   CREATININE 1.52 (H) 02/07/2023   Stable overall, cont to avoid nephrotoxins   Diabetes mellitus type II, non insulin  dependent (HCC) Lab Results  Component Value Date   HGBA1C 6.9 (H) 02/12/2023   Stable, pt to continue current medical treatment  - diet, wt control   Essential  hypertension BP Readings from Last 3 Encounters:  02/12/23 110/62  02/06/23 (!) 118/58  10/30/22 104/60   Stable, pt to continue medical treatment norvasc  5 every day, cardura  2 mg at bedtime,    Lesion of left pinna Small can't r/o skin ca - for derm referral  Vitamin D  deficiency Last vitamin D  Lab Results  Component Value Date   VD25OH 52.45 02/12/2023   Stable, cont oral replacement  Followup: Return in about 6 months (around 08/12/2023).  [name], MD 02/16/2023 9:32 PM [facility]

## 2023-02-12 NOTE — Patient Instructions (Signed)
 Please take all new medication as prescribed -the trelegy for the lungs  Please continue all other medications as before, and refills have been done if requested.  Please have the pharmacy call with any other refills you may need.  Please continue your efforts at being more active, low cholesterol diet, and weight control.  You are otherwise up to date with prevention measures today.  Please keep your appointments with your specialists as you may have planned  You will be contacted regarding the referral for: dermatology  Please go to the LAB at the blood drawing area for the tests to be done  You will be contacted by phone if any changes need to be made immediately.  Otherwise, you will receive a letter about your results with an explanation, but please check with MyChart first.  Please make an Appointment to return in 6 months, or sooner if needed

## 2023-02-16 ENCOUNTER — Encounter: Payer: Self-pay | Admitting: Internal Medicine

## 2023-02-16 NOTE — Assessment & Plan Note (Signed)
 Age and sex appropriate education and counseling updated with regular exercise and diet Referrals for preventative services - pt wife to call for eye exam soon Immunizations addressed - declines covid booster Smoking counseling  - none needed Evidence for depression or other mood disorder - none significant Most recent labs reviewed. I have personally reviewed and have noted: 1) the patient's medical and social history 2) The patient's current medications and supplements 3) The patient's height, weight, and BMI have been recorded in the chart

## 2023-02-16 NOTE — Assessment & Plan Note (Signed)
 Lab Results  Component Value Date   HGBA1C 6.9 (H) 02/12/2023   Stable, pt to continue current medical treatment  - diet, wt control

## 2023-02-16 NOTE — Assessment & Plan Note (Signed)
 Small can't r/o skin ca - for derm referral

## 2023-02-16 NOTE — Assessment & Plan Note (Signed)
 Last vitamin D  Lab Results  Component Value Date   VD25OH 52.45 02/12/2023   Stable, cont oral replacement

## 2023-02-16 NOTE — Assessment & Plan Note (Signed)
 Mild uncontrolled, for change inhaler to trelegy 1 qd

## 2023-02-16 NOTE — Assessment & Plan Note (Signed)
 Lab Results  Component Value Date   CREATININE 1.52 (H) 02/07/2023   Stable overall, cont to avoid nephrotoxins

## 2023-02-16 NOTE — Assessment & Plan Note (Signed)
 BP Readings from Last 3 Encounters:  02/12/23 110/62  02/06/23 (!) 118/58  10/30/22 104/60   Stable, pt to continue medical treatment norvasc  5 every day, cardura  2 mg at bedtime,

## 2023-03-08 ENCOUNTER — Ambulatory Visit (INDEPENDENT_AMBULATORY_CARE_PROVIDER_SITE_OTHER): Payer: Medicare HMO

## 2023-03-08 DIAGNOSIS — I255 Ischemic cardiomyopathy: Secondary | ICD-10-CM

## 2023-03-09 ENCOUNTER — Other Ambulatory Visit: Payer: Self-pay

## 2023-03-09 ENCOUNTER — Encounter (HOSPITAL_COMMUNITY): Payer: Self-pay

## 2023-03-09 ENCOUNTER — Emergency Department (HOSPITAL_COMMUNITY)
Admission: EM | Admit: 2023-03-09 | Discharge: 2023-03-09 | Disposition: A | Discharge status: Home / Self Care | Attending: Emergency Medicine | Admitting: Emergency Medicine

## 2023-03-09 DIAGNOSIS — K0889 Other specified disorders of teeth and supporting structures: Secondary | ICD-10-CM | POA: Insufficient documentation

## 2023-03-09 DIAGNOSIS — Z7982 Long term (current) use of aspirin: Secondary | ICD-10-CM | POA: Insufficient documentation

## 2023-03-09 MED ORDER — AMOXICILLIN 500 MG PO CAPS: 500.0000 mg | ORAL_CAPSULE | Freq: Three times a day (TID) | ORAL | 0 refills | Status: DC

## 2023-03-09 NOTE — ED Triage Notes (Signed)
 Pt having pain with right upper tooth. Pt states pain has been intermittent and worsened yesterday.

## 2023-03-09 NOTE — Discharge Instructions (Addendum)
 Take the antibiotics as discussed.  Follow-up with your dentist as soon as possible.  Return to the emergency room if you have any worsening symptoms.

## 2023-03-09 NOTE — ED Provider Notes (Signed)
 [place] EMERGENCY DEPARTMENT AT [facility] Provider Note   CSN: [id number] Arrival date & time: 03/09/23  0940     History  Chief Complaint  Patient presents with   Dental Pain    Philip Kerr is a 89 y.o. male.  Patient is a 89 year old who presents with pain in his right upper tooth.  History is obtained from the patient and his wife.  His wife states that his teeth have been falling out.  He has recently been having increased pain and some swelling around one of his remaining teeth in his right upper mouth.  He wants me to pull the tooth out.  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  His wife took him up to a dentist yesterday but did not realize that it closed at 3.  He has not had any fevers or vomiting.       Home Medications Prior to Admission medications   Medication Sig Start Date End Date Taking? Authorizing Provider  amoxicillin (AMOXIL) 500 MG capsule Take 1 capsule (500 mg total) by mouth 3 (three) times daily. 03/09/23  Yes Rolan Bucco, MD  acetaminophen (TYLENOL) 325 MG tablet Take 650 mg by mouth every 6 (six) hours as needed for moderate pain or headache.    [provider]  amiodarone (PACERONE) 200 MG tablet Take 1 tablet (200 mg total) by mouth daily. 01/13/21   Alver Sorrow, NP  amLODipine (NORVASC) 5 MG tablet Take 1 tablet (5 mg total) by mouth daily. 06/06/22   Kathleene Hazel, MD  Ascorbic Acid (VITAMIN C) 1000 MG tablet Take 1,000 mg by mouth daily.    [provider]  aspirin EC 81 MG tablet Take 1 tablet (81 mg total) by mouth daily. Swallow whole. 12/12/20   Graciella Freer, PA-C  Cholecalciferol (VITAMIN D3) 10 MCG (400 UNIT) tablet Take 400 Units by mouth daily.    [provider]  Cinnamon 500 MG capsule Take 1,000 mg by mouth daily.    [provider]  diclofenac Sodium (VOLTAREN) 1 % GEL Apply 2 g topically 4 (four) times daily. 06/13/22   Henson, Vickie L, NP-C   diphenhydramine-acetaminophen (TYLENOL PM) 25-500 MG TABS tablet Take 2 tablets by mouth at bedtime.    [provider]  docusate sodium (COLACE) 250 MG capsule Take 250 mg by mouth daily as needed for mild constipation.    [provider]  doxazosin (CARDURA) 2 MG tablet TAKE 1 TABLET BY MOUTH AT BEDTIME. 08/09/22   Myrlene Broker, MD  Fluticasone-Umeclidin-Vilant (TRELEGY ELLIPTA) 100-62.5-25 MCG/ACT AEPB Inhale 1 puff into the lungs daily. 02/12/23   Corwin Levins, MD  furosemide (LASIX) 20 MG tablet Take 1 tablet (20 mg total) by mouth daily. 10/30/22   Kathleene Hazel, MD  ondansetron (ZOFRAN) 4 MG tablet Take 1 tablet (4 mg total) by mouth every 8 (eight) hours as needed for nausea or vomiting. 12/13/20   Corwin Levins, MD  predniSONE (DELTASONE) 10 MG tablet 3 tabs by mouth per day for 3 days,2tabs per day for 3 days,1tab per day for 3 days 10/12/22   Corwin Levins, MD  QUEtiapine (SEROQUEL) 50 MG tablet TAKE 1/2 TAB BY MOUTH IN THE AM, AND 1 TAB AT BEDTIME 12/24/22   Corwin Levins, MD  rosuvastatin (CRESTOR) 20 MG tablet TAKE 1 TABLET BY MOUTH EVERY DAY 08/27/22   Kathleene Hazel, MD  tamsulosin (FLOMAX) 0.4 MG CAPS capsule TAKE 1  CAPSULE BY MOUTH EVERY DAY AFTER SUPPER 09/24/22   Corwin Levins, MD  traZODone (DESYREL) 50 MG tablet TAKE 1/2 TO 1 TABLET BY MOUTH AT BEDTIME AS NEEDED FOR SLEEP 12/24/22   Corwin Levins, MD  vitamin E 180 MG (400 UNITS) capsule Take 400 Units by mouth daily.    [provider]      Allergies    Patient has no known allergies.    Review of Systems   Review of Systems  Constitutional:  Negative for fever.  HENT:  Positive for dental problem.   Gastrointestinal:  Negative for nausea and vomiting.  Neurological:  Negative for headaches.    Physical Exam Updated Vital Signs BP 125/65 (BP Location: Right Arm)   Pulse 93   Temp 97.6 F (36.4 C) (Oral)   Resp 18   Ht 5\' 10"  (1.778 m)   Wt 76 kg   SpO2 99%    BMI 24.04 kg/m  Physical Exam HENT:     Head: Normocephalic and atraumatic.     Mouth/Throat:     Comments: Very poor dentition.  He has few remaining teeth in his mouth.  He has a single tooth in his right upper gum which appears to be a canine tooth.  It is well-seated in the gumline.  It is not loose.  There is some mild swelling of the gingiva around it.  No fluctuant abscesses noted.  No drainage.  No other facial swelling is noted.  No trismus.  Uvula is midline. Cardiovascular:     Rate and Rhythm: Normal rate.  Pulmonary:     Effort: Pulmonary effort is normal.  Skin:    General: Skin is warm and dry.  Neurological:     Mental Status: He is alert.     ED Results / Procedures / Treatments   Labs (all labs ordered are listed, but only abnormal results are displayed) Labs Reviewed - No data to display  EKG None  Radiology No results found.  Procedures Procedures    Medications Ordered in ED Medications - No data to display  ED Course/ Medical Decision Making/ A&P                                 Medical Decision Making Risk Prescription drug management.   Patient presents with some increased pain and swelling around the decayed tooth.  I do not see an overt abscess.  Will start him on amoxicillin.  I explained to him that we are not able to pull the tooth out here in the emergency department today.  Recommended close follow-up with his dentist.  Return precautions were given.  Final Clinical Impression(s) / ED Diagnoses Final diagnoses:  Pain, dental    Rx / DC Orders ED Discharge Orders          Ordered    amoxicillin (AMOXIL) 500 MG capsule  3 times daily        03/09/23 1106              Rolan Bucco, MD 03/09/23 1111

## 2023-03-11 DIAGNOSIS — R338 Other retention of urine: Secondary | ICD-10-CM | POA: Diagnosis not present

## 2023-03-11 LAB — CUP PACEART REMOTE DEVICE CHECK
Battery Remaining Longevity: 153 mo
Battery Voltage: 3.04 V
Brady Statistic AP VP Percent: 0.06 %
Brady Statistic AP VS Percent: 1.4 %
Brady Statistic AS VP Percent: 0.3 %
Brady Statistic AS VS Percent: 98.23 %
Brady Statistic RA Percent Paced: 1.75 %
Brady Statistic RV Percent Paced: 0.36 %
Date Time Interrogation Session: 20250228040440
Implantable Lead Connection Status: 753985
Implantable Lead Connection Status: 753985
Implantable Lead Implant Date: 20221130
Implantable Lead Implant Date: 20221130
Implantable Lead Location: 753859
Implantable Lead Location: 753860
Implantable Lead Model: 5076
Implantable Lead Model: 5076
Implantable Pulse Generator Implant Date: 20221130
Lead Channel Impedance Value: 285 Ohm
Lead Channel Impedance Value: 380 Ohm
Lead Channel Impedance Value: 418 Ohm
Lead Channel Impedance Value: 551 Ohm
Lead Channel Pacing Threshold Amplitude: 0.5 V
Lead Channel Pacing Threshold Amplitude: 0.625 V
Lead Channel Pacing Threshold Pulse Width: 0.4 ms
Lead Channel Pacing Threshold Pulse Width: 0.4 ms
Lead Channel Sensing Intrinsic Amplitude: 1.625 mV
Lead Channel Sensing Intrinsic Amplitude: 1.625 mV
Lead Channel Sensing Intrinsic Amplitude: 10.125 mV
Lead Channel Sensing Intrinsic Amplitude: 10.125 mV
Lead Channel Setting Pacing Amplitude: 1.5 V
Lead Channel Setting Pacing Amplitude: 2 V
Lead Channel Setting Pacing Pulse Width: 0.4 ms
Lead Channel Setting Sensing Sensitivity: 0.9 mV
Zone Setting Status: 755011
Zone Setting Status: 755011

## 2023-03-17 ENCOUNTER — Other Ambulatory Visit: Payer: Self-pay | Admitting: Cardiovascular Disease

## 2023-03-17 ENCOUNTER — Other Ambulatory Visit: Payer: Self-pay | Admitting: Internal Medicine

## 2023-03-26 ENCOUNTER — Other Ambulatory Visit (HOSPITAL_BASED_OUTPATIENT_CLINIC_OR_DEPARTMENT_OTHER): Payer: Self-pay | Admitting: Cardiovascular Disease

## 2023-03-26 ENCOUNTER — Other Ambulatory Visit: Payer: Self-pay | Admitting: Internal Medicine

## 2023-03-26 DIAGNOSIS — I4729 Other ventricular tachycardia: Secondary | ICD-10-CM

## 2023-03-26 DIAGNOSIS — R000 Tachycardia, unspecified: Secondary | ICD-10-CM

## 2023-03-26 DIAGNOSIS — Z79899 Other long term (current) drug therapy: Secondary | ICD-10-CM

## 2023-03-29 ENCOUNTER — Telehealth: Payer: Self-pay | Admitting: *Deleted

## 2023-03-29 NOTE — Telephone Encounter (Signed)
 Left message to call back

## 2023-03-29 NOTE — Telephone Encounter (Signed)
-----   Message from [facility] sent at 03/19/2023  7:33 PM EDT ----- Abnormal device interrogation reviewed.   Lead threshold, impedence, sensing and battery status stable and appropriate for patient.   Device parameters stable for the patient Start toprol xl 100 mg for VT

## 2023-04-05 NOTE — Progress Notes (Signed)
 Remote pacemaker transmission.

## 2023-04-05 NOTE — Addendum Note (Signed)
 Addended by: Elease Etienne A on: 04/05/2023 11:48 AM   Modules accepted: Orders

## 2023-04-11 DIAGNOSIS — R338 Other retention of urine: Secondary | ICD-10-CM | POA: Diagnosis not present

## 2023-04-12 ENCOUNTER — Ambulatory Visit: Payer: Medicare HMO | Admitting: Internal Medicine

## 2023-04-30 ENCOUNTER — Encounter: Payer: Medicare HMO | Admitting: Physician Assistant

## 2023-05-06 NOTE — Progress Notes (Unsigned)
 Cardiology Office Note Date:  05/06/2023  Patient ID:  Philip Kerr, Philip Kerr [birth year], MRN [id number] PCP:  Roslyn Coombe, MD  Cardiologist:  Dr. Abel Hoe Electrophysiologist: Dr. Lawana Pray    Chief Complaint:   *** annual EP/device visit  History of Present Illness: Philip Kerr is a 89 y.o. male with history of  CAD (CABG 1996), HTN, HLD,  DM, CKD (II), sinus node dysfunction w/PPM,  LBBB, PVCs, ICM, chronic CHF PPM  Hx of L lobectomy as a child, he reports living in an orphanage his entire childhood, at about 89 y/o developed a persistent cough, lost significant weight and eventually evaluated, found to have "some kind of infection" and they took out part of a lung, he wasn't really given much information that he recalls, but has had a chronic cough ever since  March 2019 by Isabelle Maple, NP and described a syncopal event at home. He refused any workup.  Eventually Echo September 2019 with LVEF=25-30%, severe LVH. Mild AI, moderate MR   Echo July 2022 with LVEF=45-50% with moderate MR.   He saw Dr. Lawana Pray 03/16/21, doing well, unclear medication list, pt was to call with accurate list, volume stable, no changes were made.  Saw Dr. Abel Hoe 09/27/21, doing well, no symptoms, no changes were made.  I saw him April 2024 Normal device function Baseline cough, no SOB Amio labs updated No changes made  Had a hospitalization Oct 2024 for volume OL LVEF down to 30-35%, severe MR  Seeing cardiology team a few time since then Most recently [facility] 02/06/23, active, exercising, longstanding chronic cough unchanged MR being managed with diuretic    TODAY  *** amio labs are UTD and OK *** volume  Device information MDT dual chamber PPM implanted 12/07/20  AAD hx July 2022 amiodarone  for PVCs/NSVTs during a hospitalization  Past Medical History:  Diagnosis Date   Aortic insufficiency    Coronary artery disease    coronary artery bypass graft x 5 in 1996    Diabetes mellitus    Hypercholesterolemia    non-insulin  dependent   Hypertension    Ischemic cardiomyopathy    Mitral regurgitation    PVC (premature ventricular contraction)    Sinus pause 12/07/2020   s/p PPM    Past Surgical History:  Procedure Laterality Date   CORONARY ARTERY BYPASS GRAFT     LUNG SURGERY     OTHER SURGICAL HISTORY     percutaneous coronary intervention of the  posterolateral segment on 01/27/1998   PACEMAKER IMPLANT N/A 12/07/2020   Procedure: PACEMAKER IMPLANT;  Surgeon: Lei Pump, MD;  Location: [facility];  Service: Cardiovascular;  Laterality: N/A;    Current Outpatient Medications  Medication Sig Dispense Refill   acetaminophen  (TYLENOL ) 325 MG tablet Take 650 mg by mouth every 6 (six) hours as needed for moderate pain or headache.     amiodarone  (PACERONE ) 200 MG tablet Take 1 tablet (200 mg total) by mouth daily. 90 tablet 3   amLODipine  (NORVASC ) 5 MG tablet TAKE 1 TABLET (5 MG TOTAL) BY MOUTH DAILY. 90 tablet 3   amoxicillin  (AMOXIL ) 500 MG capsule Take 1 capsule (500 mg total) by mouth 3 (three) times daily. 21 capsule 0   Ascorbic Acid (VITAMIN C) 1000 MG tablet Take 1,000 mg by mouth daily.     aspirin  EC 81 MG tablet Take 1 tablet (81 mg total) by mouth daily. Swallow whole. 30 tablet 11   Cholecalciferol (VITAMIN D3) 10  MCG (400 UNIT) tablet Take 400 Units by mouth daily.     Cinnamon 500 MG capsule Take 1,000 mg by mouth daily.     diclofenac  Sodium (VOLTAREN ) 1 % GEL Apply 2 g topically 4 (four) times daily. 150 g 0   diphenhydramine-acetaminophen  (TYLENOL  PM) 25-500 MG TABS tablet Take 2 tablets by mouth at bedtime.     docusate sodium  (COLACE) 250 MG capsule Take 250 mg by mouth daily as needed for mild constipation.     doxazosin  (CARDURA ) 2 MG tablet TAKE 1 TABLET BY MOUTH AT BEDTIME. 90 tablet 2   Fluticasone-Umeclidin-Vilant (TRELEGY ELLIPTA ) 100-62.5-25 MCG/ACT AEPB Inhale 1 puff into the lungs daily. 1 each 11    furosemide  (LASIX ) 20 MG tablet Take 1 tablet (20 mg total) by mouth daily. 90 tablet 3   ondansetron  (ZOFRAN ) 4 MG tablet Take 1 tablet (4 mg total) by mouth every 8 (eight) hours as needed for nausea or vomiting. 20 tablet 0   predniSONE  (DELTASONE ) 10 MG tablet 3 tabs by mouth per day for 3 days,2tabs per day for 3 days,1tab per day for 3 days 18 tablet 0   QUEtiapine  (SEROQUEL ) 50 MG tablet TAKE 1/2 TAB BY MOUTH IN THE AM, AND 1 TAB AT BEDTIME 135 tablet 3   rosuvastatin  (CRESTOR ) 20 MG tablet TAKE 1 TABLET BY MOUTH EVERY DAY 90 tablet 3   tamsulosin  (FLOMAX ) 0.4 MG CAPS capsule TAKE 1 CAPSULE BY MOUTH EVERY DAY AFTER SUPPER 90 capsule 3   traZODone  (DESYREL ) 50 MG tablet TAKE 1/2 TO 1 TABLET BY MOUTH AT BEDTIME AS NEEDED FOR SLEEP 90 tablet 1   vitamin E 180 MG (400 UNITS) capsule Take 400 Units by mouth daily.     No current facility-administered medications for this visit.    Allergies:   Patient has no known allergies.   Social History:  The patient  reports that he has never smoked. He has never used smokeless tobacco. He reports that he does not currently use alcohol . He reports that he does not use drugs.   Family History:  The patient's family history is not on file. He was adopted.  ROS:  Please see the history of present illness.    All other systems are reviewed and otherwise negative.   PHYSICAL EXAM:  VS:  There were no vitals taken for this visit. BMI: There is no height or weight on file to calculate BMI. Well nourished, well developed, in no acute distress HEENT: normocephalic, atraumatic Neck: no JVD, carotid bruits or masses Cardiac: *** RRR; no significant murmurs, no rubs, or gallops Lungs: ***  CTA b/l, no wheezing, rhonchi or rales Abd: soft, nontender MS: no deformity or  atrophy Ext: *** no edema Skin: warm and dry, no rash Neuro:  No gross deficits appreciated Psych: euthymic mood, full affect  *** PPM site is stable, no tethering or  discomfort   EKG:  not done today  Device interrogation done today and reviewed by myself:  *** Battery and lead measurements are good ***   11/26/22 1. Left ventricular ejection fraction, by estimation, is 30 to 35%. The  left ventricle has moderately decreased function. The left ventricle  demonstrates regional wall motion abnormalities (see scoring  diagram/findings for description). The left  ventricular internal cavity size was moderately dilated. There is moderate  concentric left ventricular hypertrophy. Left ventricular diastolic  parameters are consistent with Grade II diastolic dysfunction  (pseudonormalization). Elevated left atrial  pressure.   2. Right ventricular  systolic function is low normal. The right  ventricular size is normal.   3. Left atrial size was severely dilated.   4. Right atrial size was mild to moderately dilated.   5. The mitral valve is normal in structure. Severe mitral valve  regurgitation.   6. Tricuspid valve regurgitation is mild to moderate.   7. The aortic valve is tricuspid. Aortic valve regurgitation is mild to  moderate. No aortic stenosis is present.   8. There is mild dilatation of the ascending aorta, measuring 42 mm.   Comparison(s): The left ventricular function is worsened. The left  ventricular wall motion worse. There is worsened contractility in the  lateral wall. The left ventricle has dilated further. The mitral  insufficiency is now severe, probably secondary  (icshemic) mechanism.     TTE 07/31/20   1. Left ventricular ejection fraction, by estimation, is 45 to 50%. The  left ventricle has mildly decreased function. Left ventricular endocardial  border not optimally defined to evaluate regional wall motion. There is  severe asymmetric left ventricular   hypertrophy of the septal segment. Left ventricular diastolic parameters  are indeterminate.   2. Right ventricular systolic function is normal. The right ventricular   size is normal. Tricuspid regurgitation signal is inadequate for assessing  PA pressure.   3. Left atrial size was mildly dilated.   4. The mitral valve is abnormal. Moderate mitral valve regurgitation. No  evidence of mitral stenosis.   5. Tricuspid valve regurgitation is mild to moderate.   6. The aortic valve is tricuspid. There is mild calcification of the  aortic valve. There is mild thickening of the aortic valve.   7. Aortic dilatation noted. There is mild dilatation of the aortic root,  measuring 40 mm.   Recent Labs: 02/07/2023: BNP 1,161.7; BUN 24; Creatinine, Ser 1.52; Potassium 4.7; Sodium 140 02/12/2023: ALT 10; Hemoglobin 12.7; Platelets 213.0; TSH 1.68  02/12/2023: Cholesterol 99; HDL 40.30; LDL Cholesterol 32; Total CHOL/HDL Ratio 2; Triglycerides 133.0; VLDL 26.6   CrCl cannot be calculated (Patient's most recent lab result is older than the maximum 21 days allowed.).   Wt Readings from Last 3 Encounters:  03/09/23 167 lb 8.8 oz (76 kg)  02/12/23 167 lb (75.8 kg)  02/06/23 170 lb 6.4 oz (77.3 kg)     Other studies reviewed: Additional studies/records reviewed today include: summarized above  ASSESSMENT AND PLAN:  PPM *** Intact function *** No changes made  PVCs NSVT *** chronic Amiodarone  *** No SOB or change to his chronic cough *** Labs today  CAD *** No CP C/w Dr. McAlhany/team  ICM Chronic CHF VHD w/severe MR *** No symptoms or exam findings of volume OL C/w Dr. McAlhany/team    Disposition: *** F/u with remotes as usual, in clinic with EP again in a year, sooner if needed  Current medicines are reviewed at length with the patient today.  The patient did not have any concerns regarding medicines.  [place], PA-C 05/06/2023 8:50 AM     [facility] [address] [place] [address] [phone] (office)  [phone] (fax)

## 2023-05-07 ENCOUNTER — Encounter: Payer: Self-pay | Admitting: Physician Assistant

## 2023-05-07 ENCOUNTER — Ambulatory Visit: Attending: Physician Assistant | Admitting: Physician Assistant

## 2023-05-07 VITALS — BP 120/50 | HR 66 | Ht 70.0 in | Wt 165.8 lb

## 2023-05-07 DIAGNOSIS — I340 Nonrheumatic mitral (valve) insufficiency: Secondary | ICD-10-CM | POA: Diagnosis not present

## 2023-05-07 DIAGNOSIS — I4729 Other ventricular tachycardia: Secondary | ICD-10-CM | POA: Diagnosis not present

## 2023-05-07 DIAGNOSIS — I255 Ischemic cardiomyopathy: Secondary | ICD-10-CM | POA: Diagnosis not present

## 2023-05-07 DIAGNOSIS — Z950 Presence of cardiac pacemaker: Secondary | ICD-10-CM

## 2023-05-07 DIAGNOSIS — I5022 Chronic systolic (congestive) heart failure: Secondary | ICD-10-CM

## 2023-05-07 LAB — CUP PACEART INCLINIC DEVICE CHECK
Battery Remaining Longevity: 151 mo
Battery Voltage: 3.03 V
Brady Statistic AP VP Percent: 0.09 %
Brady Statistic AP VS Percent: 7.15 %
Brady Statistic AS VP Percent: 0.26 %
Brady Statistic AS VS Percent: 92.5 %
Brady Statistic RA Percent Paced: 8.1 %
Brady Statistic RV Percent Paced: 0.35 %
Date Time Interrogation Session: 20250429164836
Implantable Lead Connection Status: 753985
Implantable Lead Connection Status: 753985
Implantable Lead Implant Date: 20221130
Implantable Lead Implant Date: 20221130
Implantable Lead Location: 753859
Implantable Lead Location: 753860
Implantable Lead Model: 5076
Implantable Lead Model: 5076
Implantable Pulse Generator Implant Date: 20221130
Lead Channel Impedance Value: 304 Ohm
Lead Channel Impedance Value: 418 Ohm
Lead Channel Impedance Value: 494 Ohm
Lead Channel Impedance Value: 627 Ohm
Lead Channel Pacing Threshold Amplitude: 0.5 V
Lead Channel Pacing Threshold Amplitude: 0.75 V
Lead Channel Pacing Threshold Pulse Width: 0.4 ms
Lead Channel Pacing Threshold Pulse Width: 0.4 ms
Lead Channel Sensing Intrinsic Amplitude: 1.5 mV
Lead Channel Sensing Intrinsic Amplitude: 12.875 mV
Lead Channel Sensing Intrinsic Amplitude: 13.75 mV
Lead Channel Sensing Intrinsic Amplitude: 2.125 mV
Lead Channel Setting Pacing Amplitude: 1.5 V
Lead Channel Setting Pacing Amplitude: 2 V
Lead Channel Setting Pacing Pulse Width: 0.4 ms
Lead Channel Setting Sensing Sensitivity: 0.9 mV
Zone Setting Status: 755011
Zone Setting Status: 755011

## 2023-05-07 NOTE — Patient Instructions (Addendum)
 Medication Instructions:   Your physician recommends that you continue on your current medications as directed. Please refer to the Current Medication list given to you today.   *If you need a refill on your cardiac medications before your next appointment, please call your pharmacy*  Lab Work: NONE ORDERED  TODAY   If you have labs (blood work) drawn today and your tests are completely normal, you will receive your results only by: MyChart Message (if you have MyChart) OR A paper copy in the mail If you have any lab test that is abnormal or we need to change your treatment, we will call you to review the results.  Testing/Procedures: NONE ORDERED  TODAY   Follow-Up: At [facility], you and your health needs are our priority.  As part of our continuing mission to provide you with exceptional heart care, our providers are all part of one team.  This team includes your primary Cardiologist (physician) and Advanced Practice Providers or APPs (Physician Assistants and Nurse Practitioners) who all work together to provide you with the care you need, when you need it.  Your next appointment:  DR MCALHANY/APP  IN ONE - TWO  MONTHS  1 year(s)   Provider:     You may see Dr. Lawana Pray  or one of the following Advanced Practice Providers on your designated Care Team:   Mertha Abrahams, [place]   We recommend signing up for the patient portal called "MyChart".  Sign up information is provided on this After Visit Summary.  MyChart is used to connect with patients for Virtual Visits (Telemedicine).  Patients are able to view lab/test results, encounter notes, upcoming appointments, etc.  Non-urgent messages can be sent to your provider as well.   To learn more about what you can do with MyChart, go to [email or web address].   Other Instructions

## 2023-05-10 ENCOUNTER — Other Ambulatory Visit: Payer: Self-pay | Admitting: Internal Medicine

## 2023-05-10 DIAGNOSIS — R338 Other retention of urine: Secondary | ICD-10-CM | POA: Diagnosis not present

## 2023-06-05 ENCOUNTER — Encounter: Admitting: Physician Assistant

## 2023-06-07 ENCOUNTER — Ambulatory Visit (INDEPENDENT_AMBULATORY_CARE_PROVIDER_SITE_OTHER): Payer: Medicare HMO

## 2023-06-07 DIAGNOSIS — I255 Ischemic cardiomyopathy: Secondary | ICD-10-CM

## 2023-06-07 DIAGNOSIS — R338 Other retention of urine: Secondary | ICD-10-CM | POA: Diagnosis not present

## 2023-06-07 LAB — CUP PACEART REMOTE DEVICE CHECK
Battery Remaining Longevity: 150 mo
Battery Voltage: 3.03 V
Brady Statistic AP VP Percent: 0.08 %
Brady Statistic AP VS Percent: 16.44 %
Brady Statistic AS VP Percent: 0.18 %
Brady Statistic AS VS Percent: 83.3 %
Brady Statistic RA Percent Paced: 18.07 %
Brady Statistic RV Percent Paced: 0.26 %
Date Time Interrogation Session: 20250530051214
Implantable Lead Connection Status: 753985
Implantable Lead Connection Status: 753985
Implantable Lead Implant Date: 20221130
Implantable Lead Implant Date: 20221130
Implantable Lead Location: 753859
Implantable Lead Location: 753860
Implantable Lead Model: 5076
Implantable Lead Model: 5076
Implantable Pulse Generator Implant Date: 20221130
Lead Channel Impedance Value: 285 Ohm
Lead Channel Impedance Value: 361 Ohm
Lead Channel Impedance Value: 475 Ohm
Lead Channel Impedance Value: 551 Ohm
Lead Channel Pacing Threshold Amplitude: 0.375 V
Lead Channel Pacing Threshold Amplitude: 0.625 V
Lead Channel Pacing Threshold Pulse Width: 0.4 ms
Lead Channel Pacing Threshold Pulse Width: 0.4 ms
Lead Channel Sensing Intrinsic Amplitude: 1.75 mV
Lead Channel Sensing Intrinsic Amplitude: 1.75 mV
Lead Channel Sensing Intrinsic Amplitude: 13.625 mV
Lead Channel Sensing Intrinsic Amplitude: 13.625 mV
Lead Channel Setting Pacing Amplitude: 1.5 V
Lead Channel Setting Pacing Amplitude: 2 V
Lead Channel Setting Pacing Pulse Width: 0.4 ms
Lead Channel Setting Sensing Sensitivity: 0.9 mV
Zone Setting Status: 755011
Zone Setting Status: 755011

## 2023-06-09 ENCOUNTER — Ambulatory Visit: Payer: Self-pay | Admitting: Cardiology

## 2023-06-11 ENCOUNTER — Ambulatory Visit

## 2023-06-21 ENCOUNTER — Ambulatory Visit: Attending: Physician Assistant | Admitting: Physician Assistant

## 2023-06-22 NOTE — Progress Notes (Signed)
 This encounter was created in error - please disregard.

## 2023-07-08 ENCOUNTER — Other Ambulatory Visit: Payer: Self-pay

## 2023-07-08 ENCOUNTER — Other Ambulatory Visit: Payer: Self-pay | Admitting: Internal Medicine

## 2023-07-08 DIAGNOSIS — R338 Other retention of urine: Secondary | ICD-10-CM | POA: Diagnosis not present

## 2023-07-24 NOTE — Progress Notes (Deleted)
 Cardiology Office Note   Date:  07/24/2023  ID:  Philip Kerr, DOB 1933-04-23, MRN [id number] PCP: Philip Lynwood ORN, MD  Philip Kerr Providers Cardiologist:  Philip Cash, MD {   History of Present Illness Philip Kerr is a 89 y.o. male with a past medical history of CAD (CABG in 1996), HTN, HLD, DM, CKD stage II, sinus node dysfunction with PPM, LBBB, PVCs, ICM, chronic CHF, and PPM here for follow-up visit.  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  At about 89 years old he developed a persistent cough, loss of significant weight and eventually evaluated to have some kind of infection.  They took part of his lung out and has a chronic cough ever since.  March 2019 was seen by Philip Jude, NP and described a syncopal event at home.  Refused any workup.  Eventually, echocardiogram September 2018 showed LVEF 25 to 30%, severe LVH.  Mild AI and moderate MR.  Echocardiogram July 2022 with LVEF 45 to 50% with moderate MR.  He saw Dr. Inocencio 03/16/2021 and was doing better.  Some confusion about his medication list.  Volume level stable.  No changes were made.  In September 2023 he saw Dr. Cash was doing well at that time without any symptoms.  No changes were made.  Was then seen in the clinic April 2024 by EP with normal device check.  Baseline cough and shortness of breath.  Amiodarone  labs are updated.  No changes made.  Had hospitalization October 2024 for volume overload.  LVEF down to 30-35%.  Now with severe MR.  He had seen cardiology a few times since then stated that he has remained active and exercising.  MR is being managed with diuretic.  He was seen in April 2025 by EP and he reported he was doing fine at that time.  Denied any escalation in baseline chronic cough.  Reports that he gets tired quickly and more shortness of breath with less effort.  No chest pain or palpitations.  No syncope or near syncope.   His wife mentions that he keeps jerking his legs throughout the night.  Today, he***  ROS: ***  Studies Reviewed      11/26/22 1. Left ventricular ejection fraction, by estimation, is 30 to 35%. The  left ventricle has moderately decreased function. The left ventricle  demonstrates regional wall motion abnormalities (see scoring  diagram/findings for description). The left  ventricular internal cavity size was moderately dilated. There is moderate  concentric left ventricular hypertrophy. Left ventricular diastolic  parameters are consistent with Grade II diastolic dysfunction  (pseudonormalization). Elevated left atrial  pressure.   2. Right ventricular systolic function is low normal. The right  ventricular size is normal.   3. Left atrial size was severely dilated.   4. Right atrial size was mild to moderately dilated.   5. The mitral valve is normal in structure. Severe mitral valve  regurgitation.   6. Tricuspid valve regurgitation is mild to moderate.   7. The aortic valve is tricuspid. Aortic valve regurgitation is mild to  moderate. No aortic stenosis is present.   8. There is mild dilatation of the ascending aorta, measuring 42 mm.   Comparison(s): The left ventricular function is worsened. The left  ventricular wall motion worse. There is worsened contractility in the  lateral wall. The left ventricle has dilated further. The mitral  insufficiency is now severe, probably secondary  (icshemic)  mechanism.       TTE 07/31/20   1. Left ventricular ejection fraction, by estimation, is 45 to 50%. The  left ventricle has mildly decreased function. Left ventricular endocardial  border not optimally defined to evaluate regional wall motion. There is  severe asymmetric left ventricular   hypertrophy of the septal segment. Left ventricular diastolic parameters  are indeterminate.   2. Right ventricular systolic function is normal. The right ventricular  size is normal.  Tricuspid regurgitation signal is inadequate for assessing  PA pressure.   3. Left atrial size was mildly dilated.   4. The mitral valve is abnormal. Moderate mitral valve regurgitation. No  evidence of mitral stenosis.   5. Tricuspid valve regurgitation is mild to moderate.   6. The aortic valve is tricuspid. There is mild calcification of the  aortic valve. There is mild thickening of the aortic valve.   7. Aortic dilatation noted. There is mild dilatation of the aortic root,  measuring 40 mm.    Recent Labs: 02/07/2023: BNP 1,161.7; BUN 24; Creatinine, Ser 1.52; Potassium 4.7; Sodium 140 02/12/2023: ALT 10; Hemoglobin 12.7; Platelets 213.0; TSH 1.68  02/12/2023: Cholesterol 99; HDL 40.30; LDL Cholesterol 32; Total CHOL/HDL Ratio 2; Triglycerides 133.0; VLDL 26.6    CrCl cannot be calculated (Patient's most recent lab result is older than the maximum 21 days allowed.).       Wt Readings from Last 3 Encounters:  03/09/23 167 lb 8.8 oz (76 kg)  02/12/23 167 lb (75.8 kg)  02/06/23 170 lb 6.4 oz (77.3 kg)      Other studies reviewed: Additional studies/records reviewed today include: summarized above Risk Assessment/Calculations {Does this patient have ATRIAL FIBRILLATION?[phone]} No BP recorded.  {Refresh Note OR Click here to enter BP  :1}***       Physical Exam VS:  There were no vitals taken for this visit.       Wt Readings from Last 3 Encounters:  05/07/23 165 lb 12.8 oz (75.2 kg)  03/09/23 167 lb 8.8 oz (76 kg)  02/12/23 167 lb (75.8 kg)    GEN: Well nourished, well developed in no acute distress NECK: No JVD; No carotid bruits CARDIAC: ***RRR, no murmurs, rubs, gallops RESPIRATORY:  Clear to auscultation without rales, wheezing or rhonchi  ABDOMEN: Soft, non-tender, non-distended EXTREMITIES:  No edema; No deformity   ASSESSMENT AND PLAN PPM PVCs CAD ICM/Chronic CHF Severe MR    {Are you ordering a CV Procedure (e.g. stress test, cath, DCCV, TEE, etc)?    Press F2        :[id number]}  Dispo: ***  Signed, Philip LOISE Fabry, PA-C

## 2023-07-25 ENCOUNTER — Ambulatory Visit: Admitting: Physician Assistant

## 2023-07-25 DIAGNOSIS — I10 Essential (primary) hypertension: Secondary | ICD-10-CM

## 2023-07-25 DIAGNOSIS — I4729 Other ventricular tachycardia: Secondary | ICD-10-CM

## 2023-07-25 DIAGNOSIS — I2510 Atherosclerotic heart disease of native coronary artery without angina pectoris: Secondary | ICD-10-CM

## 2023-07-25 DIAGNOSIS — I255 Ischemic cardiomyopathy: Secondary | ICD-10-CM

## 2023-07-25 DIAGNOSIS — Z950 Presence of cardiac pacemaker: Secondary | ICD-10-CM

## 2023-07-25 DIAGNOSIS — E785 Hyperlipidemia, unspecified: Secondary | ICD-10-CM

## 2023-07-25 DIAGNOSIS — I5022 Chronic systolic (congestive) heart failure: Secondary | ICD-10-CM

## 2023-07-25 DIAGNOSIS — I340 Nonrheumatic mitral (valve) insufficiency: Secondary | ICD-10-CM

## 2023-07-25 NOTE — Progress Notes (Signed)
 Remote pacemaker transmission.

## 2023-07-26 ENCOUNTER — Ambulatory Visit: Attending: Physician Assistant | Admitting: Physician Assistant

## 2023-07-26 ENCOUNTER — Encounter: Payer: Self-pay | Admitting: Physician Assistant

## 2023-07-26 VITALS — BP 127/75 | HR 85 | Ht 70.0 in | Wt 168.0 lb

## 2023-07-26 DIAGNOSIS — I2510 Atherosclerotic heart disease of native coronary artery without angina pectoris: Secondary | ICD-10-CM

## 2023-07-26 DIAGNOSIS — I4729 Other ventricular tachycardia: Secondary | ICD-10-CM

## 2023-07-26 DIAGNOSIS — I340 Nonrheumatic mitral (valve) insufficiency: Secondary | ICD-10-CM

## 2023-07-26 DIAGNOSIS — E785 Hyperlipidemia, unspecified: Secondary | ICD-10-CM

## 2023-07-26 DIAGNOSIS — I255 Ischemic cardiomyopathy: Secondary | ICD-10-CM | POA: Diagnosis not present

## 2023-07-26 DIAGNOSIS — I10 Essential (primary) hypertension: Secondary | ICD-10-CM | POA: Diagnosis not present

## 2023-07-26 DIAGNOSIS — Z950 Presence of cardiac pacemaker: Secondary | ICD-10-CM | POA: Diagnosis not present

## 2023-07-26 DIAGNOSIS — I5022 Chronic systolic (congestive) heart failure: Secondary | ICD-10-CM

## 2023-07-26 NOTE — Patient Instructions (Addendum)
 Medication Instructions:  Continue to alternate the Lasix  40mg  every other day. Take 20mg  on the other days.  Monitor your weight daily.   *If you need a refill on your cardiac medications before your next appointment, please call your pharmacy*   Lab Work: Labs will be drawn today................. BMET, BNP If you have labs (blood work) drawn today and your tests are completely normal, you will receive your results only by: MyChart Message (if you have MyChart) OR A paper copy in the mail If you have any lab test that is abnormal or we need to change your treatment, we will call you to review the results.   Testing/Procedures: Your physician has requested that you have an echocardiogram in Noverber 2025. Echocardiography is a painless test that uses sound waves to create images of your heart. It provides your doctor with information about the size and shape of your heart and how well your heart's chambers and valves are working. This procedure takes approximately one hour. There are no restrictions for this procedure. Please do NOT wear cologne, perfume, aftershave, or lotions (deodorant is allowed). Please arrive 15 minutes prior to your appointment time.  Please note: We ask at that you not bring children with you during ultrasound (echo/ vascular) testing. Due to room size and safety concerns, children are not allowed in the ultrasound rooms during exams. Our front office staff cannot provide observation of children in our lobby area while testing is being conducted. An adult accompanying a patient to their appointment will only be allowed in the ultrasound room at the discretion of the ultrasound technician under special circumstances. We apologize for any inconvenience.    Follow-Up: At [facility], you and your health needs are our priority.  As part of our continuing mission to provide you with exceptional heart care, we have created designated Provider Care Teams.  These  Care Teams include your primary Cardiologist (physician) and Advanced Practice Providers (APPs -  Physician Assistants and Nurse Practitioners) who all work together to provide you with the care you need, when you need it.  We recommend signing up for the patient portal called MyChart.  Sign up information is provided on this After Visit Summary.  MyChart is used to connect with patients for Virtual Visits (Telemedicine).  Patients are able to view lab/test results, encounter notes, upcoming appointments, etc.  Non-urgent messages can be sent to your provider as well.   To learn more about what you can do with MyChart, go to [email or web address].    Your next appointment:  After the Echo in November 2025  Provider:   Lonni Cash, MD    Other Instructions Thank you for choosing [facility]!     WEIGHT LOG  DATE TIME WT HR COMMENT DATE  TIME  BP HR COMMENT

## 2023-07-26 NOTE — Progress Notes (Signed)
 Cardiology Office Note   Date:  07/26/2023  ID:  Philip Kerr, DOB 12-20-1933, MRN [id number] PCP: Norleen Lynwood ORN, MD  [facility] Providers Cardiologist:  Lonni Cash, MD {   History of Present Illness Philip Kerr is a 89 y.o. male with a past medical history of CAD (CABG in 1996), HTN, HLD, DM, CKD stage II, sinus node dysfunction with PPM, LBBB, PVCs, ICM, chronic CHF, and PPM here for follow-up visit.  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  At about 89 years old he developed a persistent cough, loss of significant weight and eventually evaluated to have some kind of infection.  They took part of his lung out and has a chronic cough ever since.  March 2019 was seen by [name], NP and described a syncopal event at home.  Refused any workup.  Eventually, echocardiogram September 2018 showed LVEF 25 to 30%, severe LVH.  Mild AI and moderate MR.  Echocardiogram July 2022 with LVEF 45 to 50% with moderate MR.  He saw Dr. Inocencio 03/16/2021 and was doing better.  Some confusion about his medication list.  Volume level stable.  No changes were made.  In September 2023 he saw Dr. Cash was doing well at that time without any symptoms.  No changes were made.  Was then seen in the clinic April 2024 by EP with normal device check.  Baseline cough and shortness of breath.  Amiodarone  labs are updated.  No changes made.  Had hospitalization October 2024 for volume overload.  LVEF down to 30-35%.  Now with severe MR.  He had seen cardiology a few times since then stated that he has remained active and exercising.  MR is being managed with diuretic.  He was seen in April 2025 by EP and he reported he was doing fine at that time.  Denied any escalation in baseline chronic cough.  Reports that he gets tired quickly and more shortness of breath with less effort.  No chest pain or palpitations.  No syncope or near syncope.   His wife mentions that he keeps jerking his legs throughout the night.  Today, he presents with severe mitral regurgitation for follow-up of his cardiac condition.  He experiences improvement in fatigue and shortness of breath since April. He denies current chest pain or shortness of breath. He experiences palpitations, described as 'skipping' beats, monitored with his ICD.  He manages fluid retention with Lasix , alternating between 40 mg and 20 mg every other day. He notes his feet are less bony than usual and monitors his weight for fluid status.  He is scheduled for an echocardiogram in November to assess heart valve and pump function.  Reports no shortness of breath nor dyspnea on exertion. Reports no chest pain, pressure, or tightness. No edema, orthopnea, PND. Reports no palpitations.   Discussed the use of AI scribe software for clinical note transcription with the patient, who gave verbal consent to proceed.   ROS: pertinent ROS in HPI  Studies Reviewed      11/26/22 1. Left ventricular ejection fraction, by estimation, is 30 to 35%. The  left ventricle has moderately decreased function. The left ventricle  demonstrates regional wall motion abnormalities (see scoring  diagram/findings for description). The left  ventricular internal cavity size was moderately dilated. There is moderate  concentric left ventricular hypertrophy. Left ventricular diastolic  parameters are consistent with Grade II diastolic dysfunction  (pseudonormalization). Elevated left  atrial  pressure.   2. Right ventricular systolic function is low normal. The right  ventricular size is normal.   3. Left atrial size was severely dilated.   4. Right atrial size was mild to moderately dilated.   5. The mitral valve is normal in structure. Severe mitral valve  regurgitation.   6. Tricuspid valve regurgitation is mild to moderate.   7. The aortic valve is tricuspid. Aortic valve regurgitation is mild to   moderate. No aortic stenosis is present.   8. There is mild dilatation of the ascending aorta, measuring 42 mm.   Comparison(s): The left ventricular function is worsened. The left  ventricular wall motion worse. There is worsened contractility in the  lateral wall. The left ventricle has dilated further. The mitral  insufficiency is now severe, probably secondary  (icshemic) mechanism.       TTE 07/31/20   1. Left ventricular ejection fraction, by estimation, is 45 to 50%. The  left ventricle has mildly decreased function. Left ventricular endocardial  border not optimally defined to evaluate regional wall motion. There is  severe asymmetric left ventricular   hypertrophy of the septal segment. Left ventricular diastolic parameters  are indeterminate.   2. Right ventricular systolic function is normal. The right ventricular  size is normal. Tricuspid regurgitation signal is inadequate for assessing  PA pressure.   3. Left atrial size was mildly dilated.   4. The mitral valve is abnormal. Moderate mitral valve regurgitation. No  evidence of mitral stenosis.   5. Tricuspid valve regurgitation is mild to moderate.   6. The aortic valve is tricuspid. There is mild calcification of the  aortic valve. There is mild thickening of the aortic valve.   7. Aortic dilatation noted. There is mild dilatation of the aortic root,  measuring 40 mm.    Recent Labs: 02/07/2023: BNP 1,161.7; BUN 24; Creatinine, Ser 1.52; Potassium 4.7; Sodium 140 02/12/2023: ALT 10; Hemoglobin 12.7; Platelets 213.0; TSH 1.68  02/12/2023: Cholesterol 99; HDL 40.30; LDL Cholesterol 32; Total CHOL/HDL Ratio 2; Triglycerides 133.0; VLDL 26.6    CrCl cannot be calculated (Patient's most recent lab result is older than the maximum 21 days allowed.).       Wt Readings from Last 3 Encounters:  03/09/23 167 lb 8.8 oz (76 kg)  02/12/23 167 lb (75.8 kg)  02/06/23 170 lb 6.4 oz (77.3 kg)      Other studies  reviewed: Additional studies/records reviewed today include: summarized above  Physical Exam VS:  BP 127/75 (BP Location: Left Arm, Patient Position: Sitting)   Pulse 85   Ht 5' 10 (1.778 m)   Wt 168 lb (76.2 kg)   SpO2 99%   BMI 24.11 kg/m        Wt Readings from Last 3 Encounters:  07/26/23 168 lb (76.2 kg)  05/07/23 165 lb 12.8 oz (75.2 kg)  03/09/23 167 lb 8.8 oz (76 kg)    GEN: Well nourished, well developed in no acute distress NECK: No JVD; No carotid bruits CARDIAC: RRR, no murmurs, rubs, gallops RESPIRATORY:  Clear to auscultation without rales, wheezing or rhonchi  ABDOMEN: Soft, non-tender, non-distended EXTREMITIES:  No edema; No deformity   ASSESSMENT AND PLAN  Severe mitral regurgitation Severe mitral regurgitation with history of weakened myocardium. Asymptomatic for dyspnea or angina. - Schedule echocardiogram in November to assess cardiac function and valve status. - Advise to report any new symptoms of dyspnea immediately.  Fluid retention Intermittent fluid retention managed with Lasix .  No significant edema, but subjective fluid retention in feet. - Adjust Lasix  dosing to 40 mg every other day and 20 mg on alternate days. - Check labs including BMP and BNP to assess renal function and fluid status. - Monitor daily weight; adjust Lasix  if weight increases by more than 2 pounds overnight. - Consider potassium supplementation based on lab results.  Chronic cough Chronic cough since childhood, associated with partial lung resection. Produces bright yellow sputum, no fever or chills.  PPM/ICM -stable without any recent shocks  CAD -no chest pain -continue current medication regimen  Hyperlipidemia -LDL 32 back in Feb -continue crestor  20mg  daily   Dispo: He can follow-up after his echocardiogram in November with Dr. Verlin  Signed, Orren LOISE Fabry, PA-C

## 2023-07-27 LAB — BASIC METABOLIC PANEL WITH GFR
BUN/Creatinine Ratio: 18 (ref 10–24)
BUN: 31 mg/dL — ABNORMAL HIGH (ref 8–27)
CO2: 22 mmol/L (ref 20–29)
Calcium: 8.9 mg/dL (ref 8.6–10.2)
Chloride: 100 mmol/L (ref 96–106)
Creatinine, Ser: 1.77 mg/dL — ABNORMAL HIGH (ref 0.76–1.27)
Glucose: 155 mg/dL — ABNORMAL HIGH (ref 70–99)
Potassium: 4.5 mmol/L (ref 3.5–5.2)
Sodium: 138 mmol/L (ref 134–144)
eGFR: 37 mL/min/1.73 — ABNORMAL LOW (ref >59)

## 2023-07-27 LAB — BRAIN NATRIURETIC PEPTIDE: BNP: 686.7 pg/mL — ABNORMAL HIGH (ref 0.0–100.0)

## 2023-07-29 ENCOUNTER — Ambulatory Visit: Payer: Self-pay | Admitting: *Deleted

## 2023-08-06 DIAGNOSIS — R338 Other retention of urine: Secondary | ICD-10-CM | POA: Diagnosis not present

## 2023-08-12 ENCOUNTER — Ambulatory Visit: Payer: Medicare HMO | Admitting: Internal Medicine

## 2023-08-20 ENCOUNTER — Ambulatory Visit: Admitting: Student

## 2023-09-02 ENCOUNTER — Ambulatory Visit (HOSPITAL_COMMUNITY)
Admission: RE | Admit: 2023-09-02 | Discharge: 2023-09-02 | Disposition: A | Discharge status: Home / Self Care | Source: Ambulatory Visit | Attending: Cardiology | Admitting: Cardiology

## 2023-09-02 DIAGNOSIS — I340 Nonrheumatic mitral (valve) insufficiency: Secondary | ICD-10-CM | POA: Diagnosis not present

## 2023-09-02 LAB — ECHOCARDIOGRAM COMPLETE
MV M vel: 5.02 m/s
MV Peak grad: 100.8 mmHg
P 1/2 time: 359 ms
S' Lateral: 4.2 cm

## 2023-09-06 DIAGNOSIS — R338 Other retention of urine: Secondary | ICD-10-CM | POA: Diagnosis not present

## 2023-09-08 ENCOUNTER — Other Ambulatory Visit: Payer: Self-pay | Admitting: Internal Medicine

## 2023-09-10 ENCOUNTER — Ambulatory Visit (INDEPENDENT_AMBULATORY_CARE_PROVIDER_SITE_OTHER)

## 2023-09-10 DIAGNOSIS — I255 Ischemic cardiomyopathy: Secondary | ICD-10-CM | POA: Diagnosis not present

## 2023-09-12 ENCOUNTER — Other Ambulatory Visit: Payer: Self-pay

## 2023-09-12 ENCOUNTER — Ambulatory Visit: Payer: Self-pay | Admitting: Cardiology

## 2023-09-12 LAB — CUP PACEART REMOTE DEVICE CHECK
Battery Remaining Longevity: 147 mo
Battery Voltage: 3.03 V
Brady Statistic AP VP Percent: 0.09 %
Brady Statistic AP VS Percent: 6.14 %
Brady Statistic AS VP Percent: 0.35 %
Brady Statistic AS VS Percent: 93.43 %
Brady Statistic RA Percent Paced: 7.1 %
Brady Statistic RV Percent Paced: 0.44 %
Date Time Interrogation Session: 20250902055723
Implantable Lead Connection Status: 753985
Implantable Lead Connection Status: 753985
Implantable Lead Implant Date: 20221130
Implantable Lead Implant Date: 20221130
Implantable Lead Location: 753859
Implantable Lead Location: 753860
Implantable Lead Model: 5076
Implantable Lead Model: 5076
Implantable Pulse Generator Implant Date: 20221130
Lead Channel Impedance Value: 285 Ohm
Lead Channel Impedance Value: 399 Ohm
Lead Channel Impedance Value: 437 Ohm
Lead Channel Impedance Value: 513 Ohm
Lead Channel Pacing Threshold Amplitude: 0.5 V
Lead Channel Pacing Threshold Amplitude: 0.75 V
Lead Channel Pacing Threshold Pulse Width: 0.4 ms
Lead Channel Pacing Threshold Pulse Width: 0.4 ms
Lead Channel Sensing Intrinsic Amplitude: 1.625 mV
Lead Channel Sensing Intrinsic Amplitude: 1.625 mV
Lead Channel Sensing Intrinsic Amplitude: 12.625 mV
Lead Channel Sensing Intrinsic Amplitude: 12.625 mV
Lead Channel Setting Pacing Amplitude: 1.5 V
Lead Channel Setting Pacing Amplitude: 2 V
Lead Channel Setting Pacing Pulse Width: 0.4 ms
Lead Channel Setting Sensing Sensitivity: 0.9 mV
Zone Setting Status: 755011
Zone Setting Status: 755011

## 2023-09-12 MED ORDER — FUROSEMIDE 20 MG PO TABS: 20.0000 mg | ORAL_TABLET | Freq: Every day | ORAL | 0 refills | Status: DC

## 2023-09-17 NOTE — Progress Notes (Signed)
 Remote PPM Transmission

## 2023-10-06 ENCOUNTER — Other Ambulatory Visit: Payer: Self-pay | Admitting: Cardiovascular Disease

## 2023-10-07 DIAGNOSIS — R338 Other retention of urine: Secondary | ICD-10-CM | POA: Diagnosis not present

## 2023-10-08 ENCOUNTER — Emergency Department (HOSPITAL_BASED_OUTPATIENT_CLINIC_OR_DEPARTMENT_OTHER)
Admission: EM | Admit: 2023-10-08 | Discharge: 2023-10-08 | Disposition: A | Discharge status: Home / Self Care | Attending: Emergency Medicine | Admitting: Emergency Medicine

## 2023-10-08 ENCOUNTER — Encounter (HOSPITAL_BASED_OUTPATIENT_CLINIC_OR_DEPARTMENT_OTHER): Payer: Self-pay | Admitting: Urology

## 2023-10-08 DIAGNOSIS — Z7982 Long term (current) use of aspirin: Secondary | ICD-10-CM | POA: Insufficient documentation

## 2023-10-08 DIAGNOSIS — R339 Retention of urine, unspecified: Secondary | ICD-10-CM | POA: Diagnosis not present

## 2023-10-08 LAB — URINALYSIS, W/ REFLEX TO CULTURE (INFECTION SUSPECTED)
Bilirubin Urine: NEGATIVE
Glucose, UA: NEGATIVE mg/dL
Ketones, ur: NEGATIVE mg/dL
Nitrite: NEGATIVE
Protein, ur: 100 mg/dL — AB
RBC / HPF: >50 RBC/hpf (ref 0–5)
Specific Gravity, Urine: 1.018 (ref 1.005–1.030)
WBC, UA: >50 WBC/hpf (ref 0–5)
pH: 5.5 (ref 5.0–8.0)

## 2023-10-08 MED ORDER — NITROFURANTOIN MONOHYD MACRO 100 MG PO CAPS: 100.0000 mg | ORAL_CAPSULE | Freq: Two times a day (BID) | ORAL | 0 refills | Status: DC

## 2023-10-08 NOTE — ED Provider Notes (Signed)
 [place] EMERGENCY DEPARTMENT AT [facility] Provider Note   CSN: [id number] Arrival date & time: 10/08/23  0015     Patient presents with: Foley Problem    Philip Kerr is a 89 y.o. male.   Presents to the emergency department for evaluation of problems with his chronic indwelling Foley catheter.  Patient reports that he had his catheter replaced at urology today.  Since having it placed, he has been having a lot of pain around the catheter and thinks there has been blood in his urine.       Prior to Admission medications   Medication Sig Start Date End Date Taking? Authorizing Provider  nitrofurantoin, macrocrystal-monohydrate, (MACROBID) 100 MG capsule Take 1 capsule (100 mg total) by mouth 2 (two) times daily. 10/08/23  Yes Keaja Reaume, Lonni PARAS, MD  acetaminophen  (TYLENOL ) 325 MG tablet Take 650 mg by mouth every 6 (six) hours as needed for moderate pain or headache.    [provider]  amiodarone  (PACERONE ) 200 MG tablet Take 1 tablet (200 mg total) by mouth daily. 01/13/21   Vannie Reche RAMAN, NP  amLODipine  (NORVASC ) 5 MG tablet TAKE 1 TABLET (5 MG TOTAL) BY MOUTH DAILY. 03/20/23   Verlin Lonni BIRCH, MD  Ascorbic Acid (VITAMIN C) 1000 MG tablet Take 1,000 mg by mouth daily.    [provider]  aspirin  EC 81 MG tablet Take 1 tablet (81 mg total) by mouth daily. Swallow whole. 12/12/20   Lesia Ozell Barter, PA-C  Cholecalciferol (VITAMIN D3) 10 MCG (400 UNIT) tablet Take 400 Units by mouth daily.    [provider]  Cinnamon 500 MG capsule Take 1,000 mg by mouth daily.    [provider]  diphenhydramine-acetaminophen  (TYLENOL  PM) 25-500 MG TABS tablet Take 2 tablets by mouth at bedtime.    [provider]  docusate sodium  (COLACE) 250 MG capsule Take 250 mg by mouth daily as needed for mild constipation.    [provider]  doxazosin  (CARDURA ) 2 MG tablet TAKE 1 TABLET BY MOUTH EVERYDAY AT BEDTIME  05/10/23   Norleen Lynwood ORN, MD  furosemide  (LASIX ) 20 MG tablet Take 1 tablet (20 mg total) by mouth daily. 09/12/23   Verlin Lonni BIRCH, MD  QUEtiapine  (SEROQUEL ) 50 MG tablet TAKE 1/2 TAB BY MOUTH IN THE AM, AND 1 TAB AT BEDTIME 12/24/22   Norleen Lynwood ORN, MD  rosuvastatin  (CRESTOR ) 20 MG tablet TAKE 1 TABLET BY MOUTH EVERY DAY 03/28/23   Verlin Lonni BIRCH, MD  tamsulosin  (FLOMAX ) 0.4 MG CAPS capsule TAKE 1 CAPSULE BY MOUTH EVERY DAY AFTER SUPPER 09/10/23   Norleen Lynwood ORN, MD  traZODone  (DESYREL ) 50 MG tablet TAKE 1/2 TO 1 TABLET BY MOUTH AT BEDTIME AS NEEDED FOR SLEEP 07/08/23   Norleen Lynwood ORN, MD  vitamin E 180 MG (400 UNITS) capsule Take 400 Units by mouth daily.    [provider]    Allergies: Patient has no known allergies.    Review of Systems  Updated Vital Signs BP (!) 117/54 (BP Location: Right Arm)   Pulse 89   Temp 98 F (36.7 C)   Resp 20   Ht 5' 10 (1.778 m)   Wt 76.2 kg   SpO2 93%   BMI 24.10 kg/m   Physical Exam Vitals and nursing note reviewed.  Constitutional:      General: He is not in acute distress.    Appearance: He is well-developed.  HENT:     Head: Normocephalic and  atraumatic.     Mouth/Throat:     Mouth: Mucous membranes are moist.  Eyes:     General: Vision grossly intact. Gaze aligned appropriately.     Extraocular Movements: Extraocular movements intact.     Conjunctiva/sclera: Conjunctivae normal.  Cardiovascular:     Rate and Rhythm: Normal rate and regular rhythm.     Pulses: Normal pulses.     Heart sounds: Normal heart sounds, S1 normal and S2 normal. No murmur heard.    No friction rub. No gallop.  Pulmonary:     Effort: Pulmonary effort is normal. No respiratory distress.     Breath sounds: Normal breath sounds.  Abdominal:     Palpations: Abdomen is soft.     Tenderness: There is no abdominal tenderness. There is no guarding or rebound.     Hernia: No hernia is present.  Musculoskeletal:        General: No swelling.      Cervical back: Full passive range of motion without pain, normal range of motion and neck supple. No pain with movement, spinous process tenderness or muscular tenderness. Normal range of motion.     Right lower leg: No edema.     Left lower leg: No edema.  Skin:    General: Skin is warm and dry.     Capillary Refill: Capillary refill takes less than 2 seconds.     Findings: No ecchymosis, erythema, lesion or wound.  Neurological:     Mental Status: He is alert and oriented to person, place, and time.     GCS: GCS eye subscore is 4. GCS verbal subscore is 5. GCS motor subscore is 6.     Cranial Nerves: Cranial nerves 2-12 are intact.     Sensory: Sensation is intact.     Motor: Motor function is intact. No weakness or abnormal muscle tone.     Coordination: Coordination is intact.  Psychiatric:        Mood and Affect: Mood normal.        Speech: Speech normal.        Behavior: Behavior normal.     (all labs ordered are listed, but only abnormal results are displayed) Labs Reviewed  URINALYSIS, W/ REFLEX TO CULTURE (INFECTION SUSPECTED) - Abnormal; Notable for the following components:      Result Value   APPearance HAZY (*)    Hgb urine dipstick LARGE (*)    Protein, ur 100 (*)    Leukocytes,Ua LARGE (*)    Bacteria, UA MANY (*)    Crystals PRESENT (*)    All other components within normal limits  URINE CULTURE    EKG: None  Radiology: No results found.   Procedures   Medications Ordered in the ED - No data to display                                  Medical Decision Making Amount and/or Complexity of Data Reviewed Labs: ordered.  Risk Prescription drug management.   Differential diagnosis considered includes, but not limited to: Foley malfunction; UTI  Dents to Emergency Department with pain secondary to recent Foley catheter exchange.  Patient's catheter was replaced and he is draining freely now, pain has resolved.     Final diagnoses:  Urinary  retention    ED Discharge Orders          Ordered    nitrofurantoin, macrocrystal-monohydrate, (MACROBID) 100 MG  capsule  2 times daily        10/08/23 0202               Haze Lonni PARAS, MD 10/08/23 [phone]

## 2023-10-08 NOTE — ED Triage Notes (Signed)
 Pt states pain with urination  Has catheter with leg bag, changed it today, very little urine output per pt, noticed blood in it

## 2023-10-08 NOTE — Discharge Instructions (Addendum)
 Schedule follow-up with your urologist for a recheck in the office for as soon as possible.

## 2023-10-10 LAB — URINE CULTURE: Culture: >=100000 — AB

## 2023-10-11 ENCOUNTER — Telehealth (HOSPITAL_BASED_OUTPATIENT_CLINIC_OR_DEPARTMENT_OTHER): Payer: Self-pay

## 2023-10-11 NOTE — Telephone Encounter (Signed)
 Post ED Visit - Positive Culture Follow-up: Successful Patient Follow-Up  Culture assessed and recommendations reviewed by:  [x]  Maurilio Patten, Pharm.D. []  Venetia Gully, Pharm.D., BCPS AQ-ID []  Garrel Crews, Pharm.D., BCPS []  Almarie Lunger, Pharm.D., BCPS []  [place], [address].D., BCPS, AAHIVP []  Rosaline Bihari, Pharm.D., BCPS, AAHIVP []  Vernell Meier, PharmD, BCPS []  Latanya Hint, PharmD, BCPS []  Donald Medley, PharmD, BCPS []  Rocky Bold, PharmD  Positive urine culture  []  Patient discharged without antimicrobial prescription and treatment is now indicated [x]  Organism is resistant to prescribed ED discharge antimicrobial []  Patient with positive blood cultures  Changes discussed with ED provider: Lavonia Pat, MD New antibiotic prescription Cephalexin  500 mg po Q 12 hours x 7 days Called to [facility] in [place]  Contacted patient, date 10/11/23, time 11:10 am   Ruth Camelia Elbe 10/11/2023, 11:13 AM

## 2023-10-13 ENCOUNTER — Other Ambulatory Visit: Payer: Self-pay

## 2023-10-13 ENCOUNTER — Emergency Department (HOSPITAL_BASED_OUTPATIENT_CLINIC_OR_DEPARTMENT_OTHER)
Admission: EM | Admit: 2023-10-13 | Discharge: 2023-10-13 | Disposition: A | Discharge status: Home / Self Care | Attending: Emergency Medicine | Admitting: Emergency Medicine

## 2023-10-13 DIAGNOSIS — E119 Type 2 diabetes mellitus without complications: Secondary | ICD-10-CM | POA: Diagnosis not present

## 2023-10-13 DIAGNOSIS — Z79899 Other long term (current) drug therapy: Secondary | ICD-10-CM | POA: Diagnosis not present

## 2023-10-13 DIAGNOSIS — Z7982 Long term (current) use of aspirin: Secondary | ICD-10-CM | POA: Diagnosis not present

## 2023-10-13 DIAGNOSIS — Z950 Presence of cardiac pacemaker: Secondary | ICD-10-CM | POA: Diagnosis not present

## 2023-10-13 DIAGNOSIS — R339 Retention of urine, unspecified: Secondary | ICD-10-CM | POA: Insufficient documentation

## 2023-10-13 DIAGNOSIS — I10 Essential (primary) hypertension: Secondary | ICD-10-CM | POA: Insufficient documentation

## 2023-10-13 DIAGNOSIS — I2510 Atherosclerotic heart disease of native coronary artery without angina pectoris: Secondary | ICD-10-CM | POA: Insufficient documentation

## 2023-10-13 DIAGNOSIS — R1030 Lower abdominal pain, unspecified: Secondary | ICD-10-CM | POA: Insufficient documentation

## 2023-10-13 LAB — URINALYSIS, ROUTINE W REFLEX MICROSCOPIC
Bilirubin Urine: NEGATIVE
Glucose, UA: NEGATIVE mg/dL
Ketones, ur: NEGATIVE mg/dL
Nitrite: NEGATIVE
Protein, ur: 30 mg/dL — AB
RBC / HPF: >50 RBC/hpf (ref 0–5)
Specific Gravity, Urine: 1.015 (ref 1.005–1.030)
pH: 5.5 (ref 5.0–8.0)

## 2023-10-13 NOTE — ED Notes (Signed)
 Pt requested to take supplies home for further irrigation if needed... Provider cleared... Pt given supplies and instructed how to use supplies... Pt stated that he understood... Pt also understood to only use the supplies once and if not successful to visit a hospital/his urologist..SABRA

## 2023-10-13 NOTE — ED Notes (Signed)
 Foley, flushed with sterile water and drained after... Pt stated that he no longer had pain after flushing... Urine sample taken to the lab... Provider informed.SABRASABRA

## 2023-10-13 NOTE — ED Provider Notes (Signed)
  EMERGENCY DEPARTMENT AT [facility] Provider Note   CSN: [id number] Arrival date & time: 10/13/23  9167     Patient presents with: Foley Problem   Philip Kerr is a 89 y.o. male with chronic indwelling Foley catheter presents with urinary retention that started last night.  He denies any bleeding from the catheter.  Denies significant abdominal pain.  Foley was just replaced on 9/30.  UA at that time was consistent with UTI.  He was discharged on Macrobid.  On 10/3 culture returned, showing resistance.  He was switched to Keflex .  He has been consistent with this.    HPI  Past Medical History:  Diagnosis Date   Aortic insufficiency    Coronary artery disease    coronary artery bypass graft x 5 in 1996   Diabetes mellitus    Hypercholesterolemia    non-insulin  dependent   Hypertension    Ischemic cardiomyopathy    Mitral regurgitation    PVC (premature ventricular contraction)    Sinus pause 12/07/2020   s/p PPM   Past Surgical History:  Procedure Laterality Date   CORONARY ARTERY BYPASS GRAFT     LUNG SURGERY     OTHER SURGICAL HISTORY     percutaneous coronary intervention of the  posterolateral segment on 01/27/1998   PACEMAKER IMPLANT N/A 12/07/2020   Procedure: PACEMAKER IMPLANT;  Surgeon: Inocencio Soyla Lunger, MD;  Location: [facility] INVASIVE CV LAB;  Service: Cardiovascular;  Laterality: N/A;       Prior to Admission medications   Medication Sig Start Date End Date Taking? Authorizing Provider  acetaminophen  (TYLENOL ) 325 MG tablet Take 650 mg by mouth every 6 (six) hours as needed for moderate pain or headache.    [provider]  amiodarone  (PACERONE ) 200 MG tablet Take 1 tablet (200 mg total) by mouth daily. 01/13/21   Walker, Caitlin S, NP  amLODipine  (NORVASC ) 5 MG tablet TAKE 1 TABLET (5 MG TOTAL) BY MOUTH DAILY. 03/20/23   Verlin Lonni BIRCH, MD  Ascorbic Acid (VITAMIN C) 1000 MG tablet Take 1,000 mg by mouth daily.    [provider]  aspirin  EC 81 MG tablet Take 1 tablet (81 mg total) by mouth daily. Swallow whole. 12/12/20   Lesia Ozell Barter, PA-C  Cholecalciferol (VITAMIN D3) 10 MCG (400 UNIT) tablet Take 400 Units by mouth daily.    [provider]  Cinnamon 500 MG capsule Take 1,000 mg by mouth daily.    [provider]  diphenhydramine-acetaminophen  (TYLENOL  PM) 25-500 MG TABS tablet Take 2 tablets by mouth at bedtime.    [provider]  docusate sodium  (COLACE) 250 MG capsule Take 250 mg by mouth daily as needed for mild constipation.    [provider]  doxazosin  (CARDURA ) 2 MG tablet TAKE 1 TABLET BY MOUTH EVERYDAY AT BEDTIME 05/10/23   Norleen Lynwood ORN, MD  furosemide  (LASIX ) 20 MG tablet Take 1 tablet (20 mg total) by mouth daily. 09/12/23   Verlin Lonni BIRCH, MD  nitrofurantoin, macrocrystal-monohydrate, (MACROBID) 100 MG capsule Take 1 capsule (100 mg total) by mouth 2 (two) times daily. 10/08/23   Haze Lonni PARAS, MD  QUEtiapine  (SEROQUEL ) 50 MG tablet TAKE 1/2 TAB BY MOUTH IN THE AM, AND 1 TAB AT BEDTIME 12/24/22   Norleen Lynwood ORN, MD  rosuvastatin  (CRESTOR ) 20 MG tablet TAKE 1 TABLET BY MOUTH EVERY DAY 03/28/23   Verlin Lonni BIRCH, MD  tamsulosin  (FLOMAX ) 0.4 MG CAPS capsule TAKE 1 CAPSULE BY  MOUTH EVERY DAY AFTER SUPPER 09/10/23   Norleen Lynwood ORN, MD  traZODone  (DESYREL ) 50 MG tablet TAKE 1/2 TO 1 TABLET BY MOUTH AT BEDTIME AS NEEDED FOR SLEEP 07/08/23   Norleen Lynwood ORN, MD  vitamin E 180 MG (400 UNITS) capsule Take 400 Units by mouth daily.    [provider]    Allergies: Patient has no known allergies.    Review of Systems  Genitourinary:  Positive for decreased urine volume.    Updated Vital Signs BP (!) 143/64   Pulse 99   Temp 97.9 F (36.6 C) (Oral)   Resp 20   Ht 5' 10 (1.778 m)   Wt 76.2 kg   SpO2 98%   BMI 24.10 kg/m   Physical Exam Vitals and nursing note reviewed.  Constitutional:      General: He is not in acute  distress.    Appearance: He is well-developed.  HENT:     Head: Normocephalic and atraumatic.  Eyes:     Conjunctiva/sclera: Conjunctivae normal.  Cardiovascular:     Rate and Rhythm: Normal rate and regular rhythm.     Heart sounds: No murmur heard. Pulmonary:     Effort: Pulmonary effort is normal. No respiratory distress.     Breath sounds: Normal breath sounds.  Abdominal:     Palpations: Abdomen is soft.     Comments: Mild suprapubic abdominal tenderness  Genitourinary:    Comments: Indwelling Foley catheter in place Musculoskeletal:        General: No swelling.     Cervical back: Neck supple.  Skin:    General: Skin is warm and dry.     Capillary Refill: Capillary refill takes less than 2 seconds.  Neurological:     Mental Status: He is alert.  Psychiatric:        Mood and Affect: Mood normal.     (all labs ordered are listed, but only abnormal results are displayed) Labs Reviewed  URINALYSIS, ROUTINE W REFLEX MICROSCOPIC - Abnormal; Notable for the following components:      Result Value   Color, Urine ORANGE (*)    APPearance HAZY (*)    Hgb urine dipstick LARGE (*)    Protein, ur 30 (*)    Leukocytes,Ua TRACE (*)    Bacteria, UA RARE (*)    All other components within normal limits  URINE CULTURE    EKG: None  Radiology: No results found.   Procedures   Medications Ordered in the ED - No data to display  Clinical Course as of 10/13/23 1017  Sun Oct 13, 2023  0930 Patient with chronic indwelling Foley catheter evaluated for urinary retention that started last night.  He is hemodynamically stable.  On exam he has minimal abdominal tenderness.  Bladder scan with 280 cc.  Will attempt flushing before considering replacement. [JT]  [id number] Successfully flushed with 480 cc drained.  Reports marked improvement of symptoms.  Will obtain urine to assess for UTI. [JT]  1012 Urinalysis, Routine w reflex microscopic -Urine, Catheterized; Indwelling urinary  catheter(!) UA with 21-50 WBCs, rare bacteria, trace leukocytes. [JT]  1016 Overall appears to be improving from UA on 9/30.  Patient is currently on Keflex .  Encouraged to complete full course and follow-up with PCP/urology to ensure symptoms are improving.  Strict return precautions provided.  Patient educated on flushing catheter at home should he experience any recurrence of urinary retention.  Instructed to attempt once if unsuccessful to return to the ER.  He is understanding agreement with plan. [JT]    Clinical Course User Index [JT] Donnajean Lynwood DEL, PA-C                                 Medical Decision Making  This patient presents to the ED with chief complaint(s) of Foley issue.  The complaint involves an extensive differential diagnosis and also carries with it a high risk of complications and morbidity.   Pertinent past medical history as listed in HPI   Additional history obtained: Records reviewed Care Everywhere/External Records  Disposition:   Patient will be discharged home. The patient has been appropriately medically screened and/or stabilized in the ED. I have low suspicion for any other emergent medical condition which would require further screening, evaluation or treatment in the ED or require inpatient management. At time of discharge the patient is hemodynamically stable and in no acute distress. I have discussed work-up results and diagnosis with patient and answered all questions. Patient is agreeable with discharge plan. We discussed strict return precautions for returning to the emergency department and they verbalized understanding.     Social Determinants of Health:   none  This note was dictated with voice recognition software.  Despite best efforts at proofreading, errors may have occurred which can change the documentation meaning.       Final diagnoses:  Urinary retention    ED Discharge Orders     None          Donnajean Lynwood DEL DEVONNA 10/13/23 1019    Roselyn Carlin NOVAK, MD 10/13/23 [phone]

## 2023-10-13 NOTE — Discharge Instructions (Addendum)
 You were evaluated in the emergency room for urinary retention.  Your catheter was successfully flushed.  Your urine analysis was consistent with a UTI that is actively being treated.  Please complete the full course of antibiotics. If you experience any new or worsening symptoms please return to emergency room.  Otherwise please follow with your urologist.

## 2023-10-13 NOTE — ED Triage Notes (Signed)
 Arrives ambulatory to the ED with complaints of increased catheter pain x1 day. Patient also reports that the catheter has not been draining like usual.

## 2023-10-13 NOTE — ED Notes (Addendum)
 DC paperwork given and  verbally understood.... Wife was on site and paperwork explained to her aswell.SABRASABRA

## 2023-10-14 DIAGNOSIS — R338 Other retention of urine: Secondary | ICD-10-CM | POA: Diagnosis not present

## 2023-10-14 DIAGNOSIS — N401 Enlarged prostate with lower urinary tract symptoms: Secondary | ICD-10-CM | POA: Diagnosis not present

## 2023-10-14 DIAGNOSIS — R399 Unspecified symptoms and signs involving the genitourinary system: Secondary | ICD-10-CM | POA: Diagnosis not present

## 2023-10-14 LAB — URINE CULTURE: Culture: <10000 — AB

## 2023-11-05 ENCOUNTER — Other Ambulatory Visit: Payer: Self-pay | Admitting: Cardiovascular Disease

## 2023-11-06 NOTE — Telephone Encounter (Signed)
 Called and left message for patient to call back. At his last office visit with Orren Fabry PA he was taking his lasix  20 mg every other day and taking 40 mg on alternate days. Would like to see if patient is still doing this, or just taking lasix  20 mg.   Orren Fabry PA's note on 07/26/23- Fluid retention Intermittent fluid retention managed with Lasix . No significant edema, but subjective fluid retention in feet. - Adjust Lasix  dosing to 40 mg every other day and 20 mg on alternate days. - Check labs including BMP and BNP to assess renal function and fluid status. - Monitor daily weight; adjust Lasix  if weight increases by more than 2 pounds overnight. - Consider potassium supplementation based on lab results.

## 2023-11-10 ENCOUNTER — Other Ambulatory Visit: Payer: Self-pay | Admitting: Cardiovascular Disease

## 2023-11-14 DIAGNOSIS — R338 Other retention of urine: Secondary | ICD-10-CM | POA: Diagnosis not present

## 2023-12-10 ENCOUNTER — Ambulatory Visit

## 2023-12-10 DIAGNOSIS — I255 Ischemic cardiomyopathy: Secondary | ICD-10-CM

## 2023-12-11 LAB — CUP PACEART REMOTE DEVICE CHECK
Battery Remaining Longevity: 144 mo
Battery Voltage: 3.03 V
Brady Statistic AP VP Percent: 0.13 %
Brady Statistic AP VS Percent: 9.79 %
Brady Statistic AS VP Percent: 0.29 %
Brady Statistic AS VS Percent: 89.78 %
Brady Statistic RA Percent Paced: 11.28 %
Brady Statistic RV Percent Paced: 0.43 %
Date Time Interrogation Session: 20251201223227
Implantable Lead Connection Status: 753985
Implantable Lead Connection Status: 753985
Implantable Lead Implant Date: 20221130
Implantable Lead Implant Date: 20221130
Implantable Lead Location: 753859
Implantable Lead Location: 753860
Implantable Lead Model: 5076
Implantable Lead Model: 5076
Implantable Pulse Generator Implant Date: 20221130
Lead Channel Impedance Value: 285 Ohm
Lead Channel Impedance Value: 361 Ohm
Lead Channel Impedance Value: 456 Ohm
Lead Channel Impedance Value: 513 Ohm
Lead Channel Pacing Threshold Amplitude: 0.5 V
Lead Channel Pacing Threshold Amplitude: 0.625 V
Lead Channel Pacing Threshold Pulse Width: 0.4 ms
Lead Channel Pacing Threshold Pulse Width: 0.4 ms
Lead Channel Sensing Intrinsic Amplitude: 1.625 mV
Lead Channel Sensing Intrinsic Amplitude: 1.625 mV
Lead Channel Sensing Intrinsic Amplitude: 11.25 mV
Lead Channel Sensing Intrinsic Amplitude: 11.25 mV
Lead Channel Setting Pacing Amplitude: 1.5 V
Lead Channel Setting Pacing Amplitude: 2 V
Lead Channel Setting Pacing Pulse Width: 0.4 ms
Lead Channel Setting Sensing Sensitivity: 0.9 mV
Zone Setting Status: 755011
Zone Setting Status: 755011

## 2023-12-12 ENCOUNTER — Ambulatory Visit: Payer: Self-pay | Admitting: Cardiology

## 2023-12-13 NOTE — Progress Notes (Signed)
 Remote PPM Transmission

## 2023-12-18 ENCOUNTER — Emergency Department (HOSPITAL_COMMUNITY)

## 2023-12-18 ENCOUNTER — Ambulatory Visit: Payer: Self-pay

## 2023-12-18 ENCOUNTER — Encounter (HOSPITAL_COMMUNITY): Payer: Self-pay

## 2023-12-18 ENCOUNTER — Other Ambulatory Visit: Payer: Self-pay

## 2023-12-18 ENCOUNTER — Inpatient Hospital Stay (HOSPITAL_COMMUNITY)
Admission: EM | Admit: 2023-12-18 | Discharge: 2023-12-21 | DRG: 698 | Disposition: A | Attending: Internal Medicine | Admitting: Internal Medicine

## 2023-12-18 DIAGNOSIS — E119 Type 2 diabetes mellitus without complications: Secondary | ICD-10-CM

## 2023-12-18 DIAGNOSIS — N1831 Chronic kidney disease, stage 3a: Secondary | ICD-10-CM | POA: Diagnosis present

## 2023-12-18 DIAGNOSIS — Z978 Presence of other specified devices: Secondary | ICD-10-CM | POA: Diagnosis not present

## 2023-12-18 DIAGNOSIS — Z66 Do not resuscitate: Secondary | ICD-10-CM | POA: Diagnosis present

## 2023-12-18 DIAGNOSIS — N1832 Chronic kidney disease, stage 3b: Secondary | ICD-10-CM | POA: Diagnosis present

## 2023-12-18 DIAGNOSIS — Z789 Other specified health status: Secondary | ICD-10-CM | POA: Diagnosis not present

## 2023-12-18 DIAGNOSIS — N3000 Acute cystitis without hematuria: Secondary | ICD-10-CM | POA: Diagnosis not present

## 2023-12-18 DIAGNOSIS — I2489 Other forms of acute ischemic heart disease: Secondary | ICD-10-CM | POA: Diagnosis not present

## 2023-12-18 DIAGNOSIS — Z79899 Other long term (current) drug therapy: Secondary | ICD-10-CM | POA: Diagnosis not present

## 2023-12-18 DIAGNOSIS — I5022 Chronic systolic (congestive) heart failure: Secondary | ICD-10-CM | POA: Diagnosis present

## 2023-12-18 DIAGNOSIS — I2510 Atherosclerotic heart disease of native coronary artery without angina pectoris: Secondary | ICD-10-CM | POA: Diagnosis present

## 2023-12-18 DIAGNOSIS — Z1611 Resistance to penicillins: Secondary | ICD-10-CM | POA: Diagnosis present

## 2023-12-18 DIAGNOSIS — Z950 Presence of cardiac pacemaker: Secondary | ICD-10-CM | POA: Diagnosis not present

## 2023-12-18 DIAGNOSIS — K4020 Bilateral inguinal hernia, without obstruction or gangrene, not specified as recurrent: Secondary | ICD-10-CM | POA: Diagnosis present

## 2023-12-18 DIAGNOSIS — R531 Weakness: Secondary | ICD-10-CM | POA: Diagnosis present

## 2023-12-18 DIAGNOSIS — J181 Lobar pneumonia, unspecified organism: Secondary | ICD-10-CM | POA: Diagnosis present

## 2023-12-18 DIAGNOSIS — N3001 Acute cystitis with hematuria: Secondary | ICD-10-CM | POA: Diagnosis not present

## 2023-12-18 DIAGNOSIS — F03918 Unspecified dementia, unspecified severity, with other behavioral disturbance: Secondary | ICD-10-CM | POA: Diagnosis present

## 2023-12-18 DIAGNOSIS — Z7984 Long term (current) use of oral hypoglycemic drugs: Secondary | ICD-10-CM | POA: Diagnosis not present

## 2023-12-18 DIAGNOSIS — N390 Urinary tract infection, site not specified: Secondary | ICD-10-CM | POA: Diagnosis present

## 2023-12-18 DIAGNOSIS — I495 Sick sinus syndrome: Secondary | ICD-10-CM | POA: Diagnosis present

## 2023-12-18 DIAGNOSIS — R062 Wheezing: Secondary | ICD-10-CM | POA: Diagnosis present

## 2023-12-18 DIAGNOSIS — E7800 Pure hypercholesterolemia, unspecified: Secondary | ICD-10-CM | POA: Diagnosis present

## 2023-12-18 DIAGNOSIS — Y846 Urinary catheterization as the cause of abnormal reaction of the patient, or of later complication, without mention of misadventure at the time of the procedure: Secondary | ICD-10-CM | POA: Diagnosis present

## 2023-12-18 DIAGNOSIS — I255 Ischemic cardiomyopathy: Secondary | ICD-10-CM | POA: Diagnosis present

## 2023-12-18 DIAGNOSIS — J440 Chronic obstructive pulmonary disease with acute lower respiratory infection: Secondary | ICD-10-CM | POA: Diagnosis present

## 2023-12-18 DIAGNOSIS — E1169 Type 2 diabetes mellitus with other specified complication: Secondary | ICD-10-CM | POA: Diagnosis not present

## 2023-12-18 DIAGNOSIS — I340 Nonrheumatic mitral (valve) insufficiency: Secondary | ICD-10-CM | POA: Diagnosis present

## 2023-12-18 DIAGNOSIS — Z951 Presence of aortocoronary bypass graft: Secondary | ICD-10-CM | POA: Diagnosis not present

## 2023-12-18 DIAGNOSIS — I130 Hypertensive heart and chronic kidney disease with heart failure and stage 1 through stage 4 chronic kidney disease, or unspecified chronic kidney disease: Secondary | ICD-10-CM | POA: Diagnosis present

## 2023-12-18 DIAGNOSIS — Z7982 Long term (current) use of aspirin: Secondary | ICD-10-CM | POA: Diagnosis not present

## 2023-12-18 DIAGNOSIS — E1122 Type 2 diabetes mellitus with diabetic chronic kidney disease: Secondary | ICD-10-CM | POA: Diagnosis present

## 2023-12-18 DIAGNOSIS — I455 Other specified heart block: Secondary | ICD-10-CM | POA: Diagnosis present

## 2023-12-18 DIAGNOSIS — T83518A Infection and inflammatory reaction due to other urinary catheter, initial encounter: Secondary | ICD-10-CM | POA: Diagnosis present

## 2023-12-18 DIAGNOSIS — I10 Essential (primary) hypertension: Secondary | ICD-10-CM | POA: Diagnosis present

## 2023-12-18 DIAGNOSIS — Z1152 Encounter for screening for COVID-19: Secondary | ICD-10-CM | POA: Diagnosis not present

## 2023-12-18 LAB — CBC WITH DIFFERENTIAL/PLATELET
Abs Immature Granulocytes: 0.02 K/uL (ref 0.00–0.07)
Basophils Absolute: 0.1 K/uL (ref 0.0–0.1)
Basophils Relative: 1 %
Eosinophils Absolute: 0.2 K/uL (ref 0.0–0.5)
Eosinophils Relative: 2 %
HCT: 40 % (ref 39.0–52.0)
Hemoglobin: 13.1 g/dL (ref 13.0–17.0)
Immature Granulocytes: 0 %
Lymphocytes Relative: 32 %
Lymphs Abs: 2.7 K/uL (ref 0.7–4.0)
MCH: 32.9 pg (ref 26.0–34.0)
MCHC: 32.8 g/dL (ref 30.0–36.0)
MCV: 100.5 fL — ABNORMAL HIGH (ref 80.0–100.0)
Monocytes Absolute: 0.6 K/uL (ref 0.1–1.0)
Monocytes Relative: 7 %
Neutro Abs: 4.8 K/uL (ref 1.7–7.7)
Neutrophils Relative %: 58 %
Platelets: 185 K/uL (ref 150–400)
RBC: 3.98 MIL/uL — ABNORMAL LOW (ref 4.22–5.81)
RDW: 13.2 % (ref 11.5–15.5)
WBC: 8.3 K/uL (ref 4.0–10.5)
nRBC: 0 % (ref 0.0–0.2)

## 2023-12-18 LAB — COMPREHENSIVE METABOLIC PANEL WITH GFR
ALT: 7 U/L (ref 0–44)
AST: 17 U/L (ref 15–41)
Albumin: 3.9 g/dL (ref 3.5–5.0)
Alkaline Phosphatase: 87 U/L (ref 38–126)
Anion gap: 13 (ref 5–15)
BUN: 20 mg/dL (ref 8–23)
CO2: 22 mmol/L (ref 22–32)
Calcium: 8.8 mg/dL — ABNORMAL LOW (ref 8.9–10.3)
Chloride: 105 mmol/L (ref 98–111)
Creatinine, Ser: 1.47 mg/dL — ABNORMAL HIGH (ref 0.61–1.24)
GFR, Estimated: 46 mL/min — ABNORMAL LOW (ref >60)
Glucose, Bld: 154 mg/dL — ABNORMAL HIGH (ref 70–99)
Potassium: 3.9 mmol/L (ref 3.5–5.1)
Sodium: 140 mmol/L (ref 135–145)
Total Bilirubin: 0.5 mg/dL (ref 0.0–1.2)
Total Protein: 7.1 g/dL (ref 6.5–8.1)

## 2023-12-18 LAB — URINALYSIS, ROUTINE W REFLEX MICROSCOPIC
Bilirubin Urine: NEGATIVE
Glucose, UA: NEGATIVE mg/dL
Ketones, ur: NEGATIVE mg/dL
Nitrite: POSITIVE — AB
Protein, ur: NEGATIVE mg/dL
Specific Gravity, Urine: 1.012 (ref 1.005–1.030)
pH: 5 (ref 5.0–8.0)

## 2023-12-18 LAB — TROPONIN T, HIGH SENSITIVITY
Troponin T High Sensitivity: 48 ng/L — ABNORMAL HIGH (ref 0–19)
Troponin T High Sensitivity: 47 ng/L — ABNORMAL HIGH (ref 0–19)

## 2023-12-18 LAB — RESP PANEL BY RT-PCR (RSV, FLU A&B, COVID)  RVPGX2
Influenza A by PCR: NEGATIVE
Influenza B by PCR: NEGATIVE
Resp Syncytial Virus by PCR: NEGATIVE
SARS Coronavirus 2 by RT PCR: NEGATIVE

## 2023-12-18 MED ORDER — SODIUM CHLORIDE 0.9 % IV SOLN
1.0000 g | Freq: Once | INTRAVENOUS | Status: AC
  Administered 2023-12-18: 1 g via INTRAVENOUS
  Filled 2023-12-18: qty 10

## 2023-12-18 MED ORDER — IOHEXOL 300 MG/ML  SOLN
80.0000 mL | Freq: Once | INTRAMUSCULAR | Status: AC | PRN
  Administered 2023-12-18: 80 mL via INTRAVENOUS

## 2023-12-18 NOTE — Telephone Encounter (Signed)
 Ok good, thanks, no new orders

## 2023-12-18 NOTE — ED Provider Notes (Signed)
 [place] EMERGENCY DEPARTMENT AT [facility] Provider Note   CSN: [id number] Arrival date & time: 12/18/23  1655     Patient presents with: Dizziness   Philip Kerr is a 89 y.o. male.   HPI 89 year old male presents with leg weakness.  History is primarily from the son at the bedside.  Patient had some dry heaving last night.  He indicates he had a little bit of weakness last night as well and the son states that he was having a hard time walking yesterday evening.  Today starting this afternoon he developed dramatic bilateral leg weakness and could not walk.  He has also had continued dry heaving.  No fevers, shortness of breath, chest pain, abdominal pain.  He has chronic diarrhea and has a chronic Foley catheter.  The patient has a chronic cough but no new or worsening cough.  Prior to Admission medications   Medication Sig Start Date End Date Taking? Authorizing Provider  acetaminophen  (TYLENOL ) 325 MG tablet Take 650 mg by mouth every 6 (six) hours as needed for moderate pain or headache.    [provider]  amiodarone  (PACERONE ) 200 MG tablet Take 1 tablet (200 mg total) by mouth daily. 01/13/21   Walker, Caitlin S, NP  amLODipine  (NORVASC ) 5 MG tablet TAKE 1 TABLET (5 MG TOTAL) BY MOUTH DAILY. 03/20/23   Verlin Lonni BIRCH, MD  Ascorbic Acid (VITAMIN C) 1000 MG tablet Take 1,000 mg by mouth daily.    [provider]  aspirin  EC 81 MG tablet Take 1 tablet (81 mg total) by mouth daily. Swallow whole. 12/12/20   Lesia Ozell Barter, PA-C  Cholecalciferol (VITAMIN D3) 10 MCG (400 UNIT) tablet Take 400 Units by mouth daily.    [provider]  Cinnamon 500 MG capsule Take 1,000 mg by mouth daily.    [provider]  diphenhydramine-acetaminophen  (TYLENOL  PM) 25-500 MG TABS tablet Take 2 tablets by mouth at bedtime.    [provider]  docusate sodium  (COLACE) 250 MG capsule Take 250 mg by mouth daily as needed for mild  constipation.    [provider]  doxazosin  (CARDURA ) 2 MG tablet TAKE 1 TABLET BY MOUTH EVERYDAY AT BEDTIME 05/10/23   Norleen Lynwood ORN, MD  furosemide  (LASIX ) 20 MG tablet TAKE 1 TABLET BY MOUTH EVERY DAY 11/12/23   Verlin Lonni BIRCH, MD  nitrofurantoin , macrocrystal-monohydrate, (MACROBID ) 100 MG capsule Take 1 capsule (100 mg total) by mouth 2 (two) times daily. 10/08/23   Haze Lonni PARAS, MD  QUEtiapine  (SEROQUEL ) 50 MG tablet TAKE 1/2 TAB BY MOUTH IN THE AM, AND 1 TAB AT BEDTIME 12/24/22   Norleen Lynwood ORN, MD  rosuvastatin  (CRESTOR ) 20 MG tablet TAKE 1 TABLET BY MOUTH EVERY DAY 03/28/23   Verlin Lonni BIRCH, MD  tamsulosin  (FLOMAX ) 0.4 MG CAPS capsule TAKE 1 CAPSULE BY MOUTH EVERY DAY AFTER SUPPER 09/10/23   Norleen Lynwood ORN, MD  traZODone  (DESYREL ) 50 MG tablet TAKE 1/2 TO 1 TABLET BY MOUTH AT BEDTIME AS NEEDED FOR SLEEP 07/08/23   Norleen Lynwood ORN, MD  vitamin E 180 MG (400 UNITS) capsule Take 400 Units by mouth daily.    [provider]    Allergies: Patient has no known allergies.    Review of Systems  Constitutional:  Negative for fever.  Respiratory:  Positive for cough. Negative for shortness of breath.   Cardiovascular:  Negative for chest pain.  Gastrointestinal:  Positive for nausea and vomiting. Negative for abdominal  pain.  Neurological:  Positive for weakness. Negative for headaches.    Updated Vital Signs BP 115/69   Pulse 82   Temp 98 F (36.7 C) (Oral)   Resp (!) 21   Ht 5' 10 (1.778 m)   Wt 67.1 kg   SpO2 97%   BMI 21.24 kg/m   Physical Exam Vitals and nursing note reviewed.  Constitutional:      Appearance: He is well-developed.  HENT:     Head: Normocephalic and atraumatic.  Cardiovascular:     Rate and Rhythm: Normal rate and regular rhythm.     Heart sounds: Normal heart sounds.  Pulmonary:     Effort: Pulmonary effort is normal.     Breath sounds: Wheezing (mild generalized wheezing) present.  Abdominal:     General: There is  no distension.     Palpations: Abdomen is soft.     Tenderness: There is no abdominal tenderness.  Skin:    General: Skin is warm and dry.  Neurological:     Mental Status: He is alert.     Comments: Awake, alert, clear speech. 5/5 strength in all 4 extremities.     (all labs ordered are listed, but only abnormal results are displayed) Labs Reviewed  URINALYSIS, ROUTINE W REFLEX MICROSCOPIC - Abnormal; Notable for the following components:      Result Value   APPearance HAZY (*)    Hgb urine dipstick SMALL (*)    Nitrite POSITIVE (*)    Leukocytes,Ua LARGE (*)    Bacteria, UA RARE (*)    All other components within normal limits  CBC WITH DIFFERENTIAL/PLATELET - Abnormal; Notable for the following components:   RBC 3.98 (*)    MCV 100.5 (*)    All other components within normal limits  COMPREHENSIVE METABOLIC PANEL WITH GFR - Abnormal; Notable for the following components:   Glucose, Bld 154 (*)    Creatinine, Ser 1.47 (*)    Calcium  8.8 (*)    GFR, Estimated 46 (*)    All other components within normal limits  TROPONIN T, HIGH SENSITIVITY - Abnormal; Notable for the following components:   Troponin T High Sensitivity 48 (*)    All other components within normal limits  TROPONIN T, HIGH SENSITIVITY - Abnormal; Notable for the following components:   Troponin T High Sensitivity 47 (*)    All other components within normal limits  URINE CULTURE  RESP PANEL BY RT-PCR (RSV, FLU A&B, COVID)  [id number]    EKG: EKG Interpretation Date/Time:  Wednesday December 18 2023 21:40:45 EST Ventricular Rate:  84 PR Interval:  214 QRS Duration:  152 QT Interval:  420 QTC Calculation: 497 R Axis:   -59  Text Interpretation: Sinus rhythm Multiform ventricular premature complexes Borderline prolonged PR interval Right bundle branch block LVH with IVCD and secondary repol abnrm Borderline prolonged QT interval Confirmed by Freddi Hamilton [phone]) on 12/18/2023 10:11:02 PM  Radiology: [facility]  Chest 2 View Result Date: 12/18/2023 EXAM: 2 VIEW(S) XRAY OF THE CHEST 12/18/2023 10:31:00 PM COMPARISON: 10/17/2022 CLINICAL HISTORY: weakness FINDINGS: LINES, TUBES AND DEVICES: Left chest wall pacemaker leads in place projecting over right atrium and ventricle. LUNGS AND PLEURA: Low lung volumes with bronchovascular crowding. Bilateral interstitial opacities. Left basilar patchy opacities. Small left pleural effusion. No pneumothorax. HEART AND MEDIASTINUM: Stable cardiomegaly. Unchanged cardiomediastinal silhouette. Median sternotomy and CABG noted. Atherosclerotic plaque. BONES AND SOFT TISSUES: Median sternotomy and CABG noted. No acute osseous abnormality. IMPRESSION: 1. Left basilar patchy  opacities with small left pleural effusion, which may reflect atelectasis or superimposed infection/aspiration. Electronically signed by: Morgane Naveau MD 12/18/2023 10:36 PM EST RP Workstation: [id number]   CT ABDOMEN PELVIS W CONTRAST Result Date: 12/18/2023 EXAM: CT ABDOMEN AND PELVIS WITH CONTRAST 12/18/2023 10:23:35 PM TECHNIQUE: CT of the abdomen and pelvis was performed with the administration of intravenous contrast. Multiplanar reformatted images are provided for review. Automated exposure control, iterative reconstruction, and/or weight-based adjustment of the mA/kV was utilized to reduce the radiation dose to as low as reasonably achievable. COMPARISON: 05/29/2021. CLINICAL HISTORY: Bowel obstruction suspected. FINDINGS: LOWER CHEST: Cardiomegaly. Pacer wires in the right heart. Scarring in the left lung base. Trace right pleural effusion. LIVER: The liver is unremarkable. GALLBLADDER AND BILE DUCTS: Gallbladder is unremarkable. No biliary ductal dilatation. SPLEEN: No acute abnormality. PANCREAS: No acute abnormality. ADRENAL GLANDS: No acute abnormality. KIDNEYS, URETERS AND BLADDER: Bilateral renal cortical thinning with numerous bilateral cysts which appear benign. Per consensus, no follow-up is  needed for simple Bosniak type 1 and 2 renal cysts, unless the patient has a malignancy history or risk factors. Foley catheter present in the bladder, which is decompressed. A portion of the anterior bladder wall is within a right inguinal hernia. No stones in the kidneys or ureters. No hydronephrosis. No perinephric or periureteral stranding. GI AND BOWEL: Stomach demonstrates no acute abnormality. Sigmoid diverticulosis. A portion of the sigmoid colon is within a large left inguinal hernia. No bowel obstruction. PERITONEUM AND RETROPERITONEUM: No ascites. No free air. VASCULATURE: Aorta is normal in caliber. Aortoiliac atherosclerosis. LYMPH NODES: No lymphadenopathy. REPRODUCTIVE ORGANS: Prostate enlargement. BONES AND SOFT TISSUES: No acute osseous abnormality. Right inguinal hernia containing a portion of the anterior bladder wall. Large left inguinal hernia containing a portion of the sigmoid colon. IMPRESSION: 1. No bowel obstruction. 2. Large left inguinal hernia containing a portion of the sigmoid colon. 3. Right inguinal hernia containing a portion of the anterior bladder wall. 4. Cardiomegaly , aortic atherosclerosis. 5. Sigmoid diverticulosis. Electronically signed by: Franky Crease MD 12/18/2023 10:29 PM EST RP Workstation: [id number]   CT Head Wo Contrast Result Date: 12/18/2023 EXAM: CT HEAD WITHOUT 12/18/2023 10:23:35 PM TECHNIQUE: CT of the head was performed without the administration of intravenous contrast. Automated exposure control, iterative reconstruction, and/or weight based adjustment of the mA/kV was utilized to reduce the radiation dose to as low as reasonably achievable. COMPARISON: 05/22/2019 CLINICAL HISTORY: Neuro deficit, acute, stroke suspected FINDINGS: BRAIN AND VENTRICLES: No acute intracranial hemorrhage. No mass effect or midline shift. No extra-axial fluid collection. No evidence of acute infarct. No hydrocephalus. Atrophy and chronic small vessel disease throughout the  deep white matter. ORBITS: No acute abnormality. SINUSES AND MASTOIDS: No acute abnormality. SOFT TISSUES AND SKULL: No acute skull fracture. No acute soft tissue abnormality. IMPRESSION: 1. No acute intracranial abnormality. 2. Chronic small vessel ischemic changes and cerebral atrophy. Electronically signed by: Franky Crease MD 12/18/2023 10:26 PM EST RP Workstation: [id number]     Procedures   Medications Ordered in the ED  iohexol (OMNIPAQUE) 300 MG/ML solution 80 mL (80 mLs Intravenous Contrast Given 12/18/23 2201)  cefTRIAXone  (ROCEPHIN ) 1 g in sodium chloride  0.9 % 100 mL IVPB (1 g Intravenous New Bag/Given 12/18/23 2153)                                    Medical Decision Making Amount and/or Complexity of Data Reviewed Labs: ordered.  Details: UTI.  Stable but mildly elevated troponins Radiology: ordered and independent interpretation performed.    Details: No bowel obstruction ECG/medicine tests: ordered and independent interpretation performed.    Details: No significant change from baseline  Risk Prescription drug management. Decision regarding hospitalization.   Patient presents with generalized weakness.  Unable to walk at home.  Could be a UTI associated with his chronic catheter.  He has had previous lung surgery with think accounts for the findings on chest x-ray.  He has known hernias but I do not think these are symptomatic today.  Benign abdominal exam.  Given his generalized weakness and inability to walk you will need admission for treatment for this UTI which I think is the likely cause.  No unilateral weakness to suggest stroke.  Given IV Rocephin .  Discussed with Dr. Adefeso for admission.  He is requesting the catheter be exchanged.     Final diagnoses:  Acute urinary tract infection    ED Discharge Orders     None          Freddi Hamilton, MD 12/18/23 [phone]

## 2023-12-18 NOTE — Telephone Encounter (Signed)
 Called patient, spoke with wife [name], patient has not gotten better and still refuses to go to the emergency room. She stated she cannot get him up to go to the ED and that she will call EMS to come for an evaluation.  She did state she was going to wait for him to be in the mood so not made at her and likley to go. Educated that he needs evaluation soon as could be a stroke or something else, and best to not wait. Expressed understanding, no additional questions at this time.

## 2023-12-18 NOTE — ED Notes (Signed)
 Spoke with Son and Wife of patient, informed them of

## 2023-12-18 NOTE — Telephone Encounter (Signed)
 FYI Only or Action Required?: Action required by provider: Refused ED and 911.  Patient was last seen in primary care on 02/12/2023 by Philip Kerr ORN, MD.  Called Nurse Triage reporting Dizziness.  Symptoms began yesterday.  Interventions attempted: Rest, hydration, or home remedies.  Symptoms are: gradually worsening.  Triage Disposition: Go to ED Now (or PCP Triage)  Patient/caregiver understands and will follow disposition?: No, wishes to speak with PCP   Copied from CRM #[id number]. Topic: Clinical - Red Word Triage >> Dec 18, 2023  2:45 PM Harlene ORN wrote: Red Word that prompted transfer to Nurse Triage: difficulty walking, nausea and dizziness; getting worse. Reason for Disposition  SEVERE dizziness (e.g., unable to stand, requires support to walk, feels like passing out now)  Answer Assessment - Initial Assessment Questions Wife [name] calling on [place] request to ask Dr. Norleen to send something in for dizziness. [name] developed sudden dizziness on 12/17/23 feels like his head is swimming he is also weak upon standing, he stayed in bed yesterday and tried to get up a short time before this call, he almost fell when he go up but spouse was with him to assist and got him back on to the bed.  ED advised, [name] states she could not get him their, this writer advised 911 but she states he will get mad if she calls ambulance, this clinical research associate offered to call 911 on their behalf but [name] refused and began crying, she is asking for message to be sent to Dr. Norleen requesting medication.     1. DESCRIPTION: Describe your dizziness.     Swimming headed 2. LIGHTHEADED: Do you feel lightheaded? (e.g., somewhat faint, woozy, weak upon standing)     Weak upon standing, unable to stand  3. VERTIGO: Do you feel like either you or the room is spinning or tilting? (i.e., vertigo)     Swimming head 4. SEVERITY: How bad is it?  Do you feel like you are going to faint? Can you stand and  walk?     Unable to ambulate 5. ONSET:  When did the dizziness begin?     yesterday 6. AGGRAVATING FACTORS: Does anything make it worse? (e.g., standing, change in head position)     Standing  7. HEART RATE: Can you tell me your heart rate? How many beats in 15 seconds?  (Note: Not all patients can do this.)        8. CAUSE: What do you think is causing the dizziness? (e.g., decreased fluids or food, diarrhea, emotional distress, heat exposure, new medicine, sudden standing, vomiting; unknown)     unsure 9. RECURRENT SYMPTOM: Have you had dizziness before? If Yes, ask: When was the last time? What happened that time?      10. OTHER SYMPTOMS: Do you have any other symptoms? (e.g., fever, chest pain, vomiting, diarrhea, bleeding)       Nausea, dry heaves.  11. PREGNANCY: Is there any chance you are pregnant? When was your last menstrual period?  Protocols used: Dizziness - Lightheadedness-A-AH

## 2023-12-18 NOTE — ED Triage Notes (Signed)
 Pt arrived via [facility] from home after family called 911 reporting the Pt has been experience dizziness and increased weakness since yesterday afternoon. Pt presents with IV Access established and EMS report administering 400ml NS PTA. Pt does have foley catheter in place and [facility] report the Pts urine has been darker in color.

## 2023-12-19 ENCOUNTER — Inpatient Hospital Stay (HOSPITAL_COMMUNITY)

## 2023-12-19 DIAGNOSIS — Z978 Presence of other specified devices: Secondary | ICD-10-CM | POA: Diagnosis not present

## 2023-12-19 DIAGNOSIS — I5022 Chronic systolic (congestive) heart failure: Secondary | ICD-10-CM | POA: Diagnosis present

## 2023-12-19 DIAGNOSIS — I2510 Atherosclerotic heart disease of native coronary artery without angina pectoris: Secondary | ICD-10-CM | POA: Diagnosis not present

## 2023-12-19 DIAGNOSIS — Z789 Other specified health status: Secondary | ICD-10-CM

## 2023-12-19 DIAGNOSIS — R29898 Other symptoms and signs involving the musculoskeletal system: Secondary | ICD-10-CM

## 2023-12-19 DIAGNOSIS — E1169 Type 2 diabetes mellitus with other specified complication: Secondary | ICD-10-CM

## 2023-12-19 DIAGNOSIS — R7989 Other specified abnormal findings of blood chemistry: Secondary | ICD-10-CM | POA: Insufficient documentation

## 2023-12-19 DIAGNOSIS — N390 Urinary tract infection, site not specified: Secondary | ICD-10-CM | POA: Diagnosis not present

## 2023-12-19 LAB — GLUCOSE, CAPILLARY
Glucose-Capillary: 109 mg/dL — ABNORMAL HIGH (ref 70–99)
Glucose-Capillary: 108 mg/dL — ABNORMAL HIGH (ref 70–99)

## 2023-12-19 LAB — CBG MONITORING, ED: Glucose-Capillary: 139 mg/dL — ABNORMAL HIGH (ref 70–99)

## 2023-12-19 LAB — HEMOGLOBIN A1C
Hgb A1c MFr Bld: 6.4 % — ABNORMAL HIGH (ref 4.8–5.6)
Mean Plasma Glucose: 136.98 mg/dL

## 2023-12-19 MED ORDER — ACETAMINOPHEN 325 MG PO TABS: 650.0000 mg | ORAL_TABLET | Freq: Four times a day (QID) | ORAL | Status: DC | PRN

## 2023-12-19 MED ORDER — ENOXAPARIN SODIUM 40 MG/0.4ML IJ SOSY
40.0000 mg | PREFILLED_SYRINGE | INTRAMUSCULAR | Status: DC
  Administered 2023-12-19 – 2023-12-21 (×3): 40 mg via SUBCUTANEOUS
  Filled 2023-12-19 (×3): qty 0.4

## 2023-12-19 MED ORDER — QUETIAPINE FUMARATE 25 MG PO TABS
25.0000 mg | ORAL_TABLET | Freq: Two times a day (BID) | ORAL | Status: DC
  Administered 2023-12-19 – 2023-12-21 (×4): 25 mg via ORAL
  Filled 2023-12-19 (×4): qty 1

## 2023-12-19 MED ORDER — SODIUM CHLORIDE 0.9 % IV SOLN
2.0000 g | INTRAVENOUS | Status: DC
  Administered 2023-12-20 – 2023-12-21 (×2): 2 g via INTRAVENOUS
  Filled 2023-12-19 (×2): qty 20

## 2023-12-19 MED ORDER — INSULIN ASPART 100 UNIT/ML IJ SOLN
0.0000 [IU] | Freq: Three times a day (TID) | INTRAMUSCULAR | Status: DC
  Administered 2023-12-19: 1 [IU] via SUBCUTANEOUS
  Administered 2023-12-20: 2 [IU] via SUBCUTANEOUS
  Administered 2023-12-21: 3 [IU] via SUBCUTANEOUS
  Filled 2023-12-19 (×4): qty 1

## 2023-12-19 MED ORDER — FUROSEMIDE 20 MG PO TABS
20.0000 mg | ORAL_TABLET | Freq: Every day | ORAL | Status: DC
  Administered 2023-12-19: 20 mg via ORAL
  Filled 2023-12-19: qty 1

## 2023-12-19 MED ORDER — TRAZODONE HCL 50 MG PO TABS: 25.0000 mg | ORAL_TABLET | Freq: Every evening | ORAL | Status: DC | PRN

## 2023-12-19 MED ORDER — SODIUM CHLORIDE 0.9 % IV SOLN
1.0000 g | INTRAVENOUS | Status: DC
  Administered 2023-12-19: 1 g via INTRAVENOUS
  Filled 2023-12-19: qty 10

## 2023-12-19 MED ORDER — ACETAMINOPHEN 650 MG RE SUPP: 650.0000 mg | Freq: Four times a day (QID) | RECTAL | Status: DC | PRN

## 2023-12-19 MED ORDER — PROCHLORPERAZINE EDISYLATE 10 MG/2ML IJ SOLN: 10.0000 mg | Freq: Four times a day (QID) | INTRAMUSCULAR | Status: DC | PRN

## 2023-12-19 MED ORDER — SODIUM CHLORIDE 0.9 % IV SOLN
500.0000 mg | Freq: Every day | INTRAVENOUS | Status: DC
  Administered 2023-12-19 – 2023-12-20 (×2): 500 mg via INTRAVENOUS
  Filled 2023-12-19 (×2): qty 5

## 2023-12-19 MED ORDER — ROSUVASTATIN CALCIUM 20 MG PO TABS
20.0000 mg | ORAL_TABLET | Freq: Every day | ORAL | Status: DC
  Administered 2023-12-19 – 2023-12-21 (×3): 20 mg via ORAL
  Filled 2023-12-19 (×3): qty 1

## 2023-12-19 MED ADMIN — Aspirin Tab Delayed Release 81 MG: 81 mg | ORAL | NDC 10135072962

## 2023-12-19 MED FILL — Aspirin Tab Delayed Release 81 MG: 81.0000 mg | ORAL | Qty: 1 | Status: AC

## 2023-12-19 MED FILL — Clotrimazole Cream 1%: CUTANEOUS | Qty: 15 | Status: AC

## 2023-12-19 NOTE — Assessment & Plan Note (Signed)
 Troponin 40s on presentation No active chest pain  Suspect this is likely chronic/baseline Monitor

## 2023-12-19 NOTE — Assessment & Plan Note (Signed)
 Baseline mild cognitive issues in discussion with wife Continue home regimen including Seroquel  and trazodone  Monitor

## 2023-12-19 NOTE — Assessment & Plan Note (Signed)
 Severe MR 2D echo August 2025 with EF of 25 to 30%, mild aortic regurgitation, severe mitral regurgitation Appears fairly euvolemic at present Continue home regimen including Lasix  as volume status and renal function dictate

## 2023-12-19 NOTE — Assessment & Plan Note (Addendum)
 Chronic indwelling foley  Pt with noted generalized weakness, nausea/vomiting with change in urine output over last 1-2 days per the wife  Recent foley exchange 12/5 with [facility] per pt and wife  Minimal abdominal pain  Noted evaluation of penile/abdominal pain 09/2023 with urine culture growing Klebsiella (+ sensitivity to rocephin )  UA indicative of infection  Started on IV rocephin  in the ER- Will continue  Repeat urine culture  Foley exchanged in the ER  Monitor

## 2023-12-19 NOTE — Assessment & Plan Note (Addendum)
 COPD  Chronic wheezing on presentation w/ noted prior lobectomy in the past and no overt increased WOB   Will add on prn duonebs Noted ? Atelectasis vs. PNA on CXR  Will check CT chest to better assess  No hypoxia at present  Will otherwise monitor pending imaging and clinical observation

## 2023-12-19 NOTE — H&P (Addendum)
 History and Physical    Patient: Philip Kerr FMW:[id number] DOB: 1933/03/21 DOA: 12/18/2023 DOS: the patient was seen and examined on 12/19/2023 PCP: Philip Lynwood ORN, MD  Patient coming from: Home  Chief Complaint:  Chief Complaint  Patient presents with   Dizziness   HPI: Philip Kerr is a 89 y.o. male with medical history significant of coronary artery disease status post CABG, type 2 diabetes, hyperlipidemia, hypertension, chronic HFrEF, sinus node dysfunction status post permanent pacemaker, chronic indwelling Foley presenting with weakness, UTI.  History from patient as well as wife.  Per report, patient with worsening weakness at home over the past 1 to 2 days.  Patient usually fairly ambulatory able to move about and do majority of ADLs on his own.  Per the wife, had difficulty with ambulation with noted nausea vomiting as well as generalized weakness.  Has 1-2 episodes of diarrhea.  Unclear if acute on chronic.  Mild cough and wheezing.  No shortness of breath reported.  Is followed by [facility] for chronic indwelling Foley.  Last exchange was on December 5.  Noted to have been evaluated September 2025 for penile pain status post replacement with finding of urinary tract infection and Klebsiella growing in the urine culture.  Sensitivities showed resistance to ampicillin as well as Macrobid  but otherwise sensitive-including Rocephin .  No reported fevers or chills.  Wife does report change in urine nephrology in the Foley bag.  No focal hemiparesis or confusion.  No reported slurred speech. Presented to the ER temperature 98.1, heart rate 60s to 80s, respiration in the teens to 20s, blood pressure 110s to 140s over 50s to 60s.  Satting well on room air.  White count 8.3, hemoglobin 13.1, platelets 185, creatinine 1.47.  Glucose 154.  Troponin in the 40s.  Urinalysis indicative of infection.  CT head as well as CT abdomen pelvis grossly within normal limits apart from large left  inguinal hernia.  Chest x-ray with basilar opacities on the left.?  Infection versus atelectasis. Review of Systems: As mentioned in the history of present illness. All other systems reviewed and are negative. Past Medical History:  Diagnosis Date   Aortic insufficiency    Coronary artery disease    coronary artery bypass graft x 5 in 1996   Diabetes mellitus    Hypercholesterolemia    non-insulin  dependent   Hypertension    Ischemic cardiomyopathy    Mitral regurgitation    PVC (premature ventricular contraction)    Sinus pause 12/07/2020   s/p PPM   Past Surgical History:  Procedure Laterality Date   CORONARY ARTERY BYPASS GRAFT     LUNG SURGERY     OTHER SURGICAL HISTORY     percutaneous coronary intervention of the  posterolateral segment on 01/27/1998   PACEMAKER IMPLANT N/A 12/07/2020   Procedure: PACEMAKER IMPLANT;  Surgeon: Philip Soyla Lunger, MD;  Location: [facility];  Service: Cardiovascular;  Laterality: N/A;   Social History:  reports that he has never smoked. He has never been exposed to tobacco smoke. He has never used smokeless tobacco. He reports that he does not currently use alcohol . He reports that he does not use drugs.  Allergies[1]  Family History  Adopted: Yes  Problem Relation Age of Onset   CAD Neg Hx     Prior to Admission medications  Medication Sig Start Date End Date Taking? Authorizing Provider  acetaminophen  (TYLENOL ) 325 MG tablet Take 650 mg by mouth every 6 (six) hours as  needed for moderate pain or headache.    [provider]  amiodarone  (PACERONE ) 200 MG tablet Take 1 tablet (200 mg total) by mouth daily. 01/13/21   Philip Reche RAMAN, NP  amLODipine  (NORVASC ) 5 MG tablet TAKE 1 TABLET (5 MG TOTAL) BY MOUTH DAILY. 03/20/23   Philip Lonni BIRCH, MD  Ascorbic Acid (VITAMIN C) 1000 MG tablet Take 1,000 mg by mouth daily.    [provider]  aspirin  EC 81 MG tablet Take 1 tablet (81 mg total) by mouth daily.  Swallow whole. 12/12/20   Philip Ozell Barter, PA-C  Cholecalciferol (VITAMIN D3) 10 MCG (400 UNIT) tablet Take 400 Units by mouth daily.    [provider]  Cinnamon 500 MG capsule Take 1,000 mg by mouth daily.    [provider]  diphenhydramine-acetaminophen  (TYLENOL  PM) 25-500 MG TABS tablet Take 2 tablets by mouth at bedtime.    [provider]  docusate sodium  (COLACE) 250 MG capsule Take 250 mg by mouth daily as needed for mild constipation.    [provider]  doxazosin  (CARDURA ) 2 MG tablet TAKE 1 TABLET BY MOUTH EVERYDAY AT BEDTIME 05/10/23   Philip Lynwood ORN, MD  furosemide  (LASIX ) 20 MG tablet TAKE 1 TABLET BY MOUTH EVERY DAY 11/12/23   Philip Lonni BIRCH, MD  nitrofurantoin , macrocrystal-monohydrate, (MACROBID ) 100 MG capsule Take 1 capsule (100 mg total) by mouth 2 (two) times daily. 10/08/23   Philip Lonni PARAS, MD  QUEtiapine  (SEROQUEL ) 50 MG tablet TAKE 1/2 TAB BY MOUTH IN THE AM, AND 1 TAB AT BEDTIME 12/24/22   Philip Lynwood ORN, MD  rosuvastatin  (CRESTOR ) 20 MG tablet TAKE 1 TABLET BY MOUTH EVERY DAY 03/28/23   Philip Lonni BIRCH, MD  tamsulosin  (FLOMAX ) 0.4 MG CAPS capsule TAKE 1 CAPSULE BY MOUTH EVERY DAY AFTER SUPPER 09/10/23   Philip Lynwood ORN, MD  traZODone  (DESYREL ) 50 MG tablet TAKE 1/2 TO 1 TABLET BY MOUTH AT BEDTIME AS NEEDED FOR SLEEP 07/08/23   Philip Lynwood ORN, MD  vitamin E 180 MG (400 UNITS) capsule Take 400 Units by mouth daily.    [provider]    Physical Exam: Vitals:   12/19/23 0500 12/19/23 0700 12/19/23 0800 12/19/23 0849  BP: 119/60 112/65 107/75 103/70  Pulse: 81 83 73 90  Resp: 17   18  Temp:    98.2 F (36.8 C)  TempSrc:    Oral  SpO2: 92% 97% 97% 90%  Weight:      Height:       Physical Exam HENT:     Head: Normocephalic and atraumatic.     Nose: Nose normal.  Eyes:     Pupils: Pupils are equal, round, and reactive to light.  Cardiovascular:     Rate and Rhythm: Normal rate and regular rhythm.   Pulmonary:     Effort: Pulmonary effort is normal.  Abdominal:     General: Bowel sounds are normal.  Genitourinary:    Penis: Normal.      Comments: + hernia present  Mild penile discharge s/p foley replacement  Skin:    General: Skin is warm.  Neurological:     General: No focal deficit present.     Mental Status: He is alert.  Psychiatric:        Mood and Affect: Mood normal.     Data Reviewed:  There are no new results to review at this time. DG Chest 2 View EXAM: 2 VIEW(S) XRAY OF THE CHEST 12/18/2023  10:31:00 PM  COMPARISON: 10/17/2022  CLINICAL HISTORY: weakness  FINDINGS:  LINES, TUBES AND DEVICES: Left chest wall pacemaker leads in place projecting over right atrium and ventricle.  LUNGS AND PLEURA: Low lung volumes with bronchovascular crowding. Bilateral interstitial opacities. Left basilar patchy opacities. Small left pleural effusion. No pneumothorax.  HEART AND MEDIASTINUM: Stable cardiomegaly. Unchanged cardiomediastinal silhouette. Median sternotomy and CABG noted. Atherosclerotic plaque.  BONES AND SOFT TISSUES: Median sternotomy and CABG noted. No acute osseous abnormality.  IMPRESSION: 1. Left basilar patchy opacities with small left pleural effusion, which may reflect atelectasis or superimposed infection/aspiration.  Electronically signed by: Kate Plummer MD 12/18/2023 10:36 PM EST RP Workstation: [id number] CT ABDOMEN PELVIS W CONTRAST EXAM: CT ABDOMEN AND PELVIS WITH CONTRAST 12/18/2023 10:23:35 PM  TECHNIQUE: CT of the abdomen and pelvis was performed with the administration of intravenous contrast. Multiplanar reformatted images are provided for review. Automated exposure control, iterative reconstruction, and/or weight-based adjustment of the mA/kV was utilized to reduce the radiation dose to as low as reasonably achievable.  COMPARISON: 05/29/2021.  CLINICAL HISTORY: Bowel obstruction  suspected.  FINDINGS:  LOWER CHEST: Cardiomegaly. Pacer wires in the right heart. Scarring in the left lung base. Trace right pleural effusion.  LIVER: The liver is unremarkable.  GALLBLADDER AND BILE DUCTS: Gallbladder is unremarkable. No biliary ductal dilatation.  SPLEEN: No acute abnormality.  PANCREAS: No acute abnormality.  ADRENAL GLANDS: No acute abnormality.  KIDNEYS, URETERS AND BLADDER: Bilateral renal cortical thinning with numerous bilateral cysts which appear benign. Per consensus, no follow-up is needed for simple Bosniak type 1 and 2 renal cysts, unless the patient has a malignancy history or risk factors. Foley catheter present in the bladder, which is decompressed. A portion of the anterior bladder wall is within a right inguinal hernia. No stones in the kidneys or ureters. No hydronephrosis. No perinephric or periureteral stranding.  GI AND BOWEL: Stomach demonstrates no acute abnormality. Sigmoid diverticulosis. A portion of the sigmoid colon is within a large left inguinal hernia. No bowel obstruction.  PERITONEUM AND RETROPERITONEUM: No ascites. No free air.  VASCULATURE: Aorta is normal in caliber. Aortoiliac atherosclerosis.  LYMPH NODES: No lymphadenopathy.  REPRODUCTIVE ORGANS: Prostate enlargement.  BONES AND SOFT TISSUES: No acute osseous abnormality. Right inguinal hernia containing a portion of the anterior bladder wall. Large left inguinal hernia containing a portion of the sigmoid colon.  IMPRESSION: 1. No bowel obstruction. 2. Large left inguinal hernia containing a portion of the sigmoid colon. 3. Right inguinal hernia containing a portion of the anterior bladder wall. 4. Cardiomegaly , aortic atherosclerosis. 5. Sigmoid diverticulosis.  Electronically signed by: Franky Crease MD 12/18/2023 10:29 PM EST RP Workstation: [id number] CT Head Wo Contrast EXAM: CT HEAD WITHOUT 12/18/2023 10:23:35 PM  TECHNIQUE: CT of the  head was performed without the administration of intravenous contrast. Automated exposure control, iterative reconstruction, and/or weight based adjustment of the mA/kV was utilized to reduce the radiation dose to as low as reasonably achievable.  COMPARISON: 05/22/2019  CLINICAL HISTORY: Neuro deficit, acute, stroke suspected  FINDINGS:  BRAIN AND VENTRICLES: No acute intracranial hemorrhage. No mass effect or midline shift. No extra-axial fluid collection. No evidence of acute infarct. No hydrocephalus. Atrophy and chronic small vessel disease throughout the deep white matter.  ORBITS: No acute abnormality.  SINUSES AND MASTOIDS: No acute abnormality.  SOFT TISSUES AND SKULL: No acute skull fracture. No acute soft tissue abnormality.  IMPRESSION: 1. No acute intracranial abnormality. 2. Chronic small vessel ischemic changes and cerebral atrophy.  Electronically signed by: Franky Crease MD 12/18/2023 10:26 PM EST RP Workstation: [id number]  Lab Results  Component Value Date   WBC 8.3 12/18/2023   HGB 13.1 12/18/2023   HCT 40.0 12/18/2023   MCV 100.5 (H) 12/18/2023   PLT 185 12/18/2023   Last metabolic panel Lab Results  Component Value Date   GLUCOSE 154 (H) 12/18/2023   NA 140 12/18/2023   K 3.9 12/18/2023   CL 105 12/18/2023   CO2 22 12/18/2023   BUN 20 12/18/2023   CREATININE 1.47 (H) 12/18/2023   GFRNONAA 46 (L) 12/18/2023   CALCIUM  8.8 (L) 12/18/2023   PHOS 3.5 08/01/2020   PROT 7.1 12/18/2023   ALBUMIN  3.9 12/18/2023   LABGLOB 2.8 05/04/2022   AGRATIO 1.3 05/04/2022   BILITOT 0.5 12/18/2023   ALKPHOS 87 12/18/2023   AST 17 12/18/2023   ALT 7 12/18/2023   ANIONGAP 13 12/18/2023    Assessment and Plan: * UTI (urinary tract infection) Chronic indwelling foley  Pt with noted generalized weakness, nausea/vomiting with change in urine output over last 1-2 days per the wife  Recent foley exchange 12/5 with [facility] per pt and wife  Minimal  abdominal pain  Noted evaluation of penile/abdominal pain 09/2023 with urine culture growing Klebsiella (+ sensitivity to rocephin )  UA indicative of infection  Started on IV rocephin  in the ER- Will continue  Repeat urine culture  Foley exchanged in the ER  Monitor    Lower extremity weakness Noted lower extremity weakness x 2 days- now resolved  No reported falls at home Was able to ambulate in the room for 40 to 50 feet without any difficulty on assessment Nonfocal neuromusculoskeletal exam Deferring any imaging given clinical resolution at presentation Will otherwise monitor for now Suspect transient weakness in setting of UTI and?  Pneumonia PT OT evaluation Monitor  Elevated troponin Troponin 40s on presentation No active chest pain  Suspect this is likely chronic/baseline Monitor  Wheezing COPD  Chronic wheezing on presentation w/ noted prior lobectomy in the past and no overt increased WOB   Will add on prn duonebs Noted ? Atelectasis vs. PNA on CXR  Will check CT chest to better assess  No hypoxia at present  Will otherwise monitor pending imaging and clinical observation     Sinus pause Noted baseline sinus node dysfunction status post permanent pacemaker 2022 Rate stable at present Monitor  Chronic kidney disease, stage 3a (HCC) Baseline Cr 1.4-1.7  Cr 1.47 today  Appears euvolemic  Monitor  Dementia with behavioral disturbance Baseline mild cognitive issues in discussion with wife Continue home regimen including Seroquel  and trazodone  Monitor  Chronic systolic CHF (congestive heart failure) (HCC) Severe MR 2D echo August 2025 with EF of 25 to 30%, mild aortic regurgitation, severe mitral regurgitation Appears fairly euvolemic at present Continue home regimen including Lasix  as volume status and renal function dictate  Coronary artery disease -s/p CABG x 5 in 1996  No active CP at present  EKG NSR and multiform PVCs, QTc 487  Continue current  GDMT with ASA 81 mg Hold crestor  pending CK level in setting of LE weakness    Essential hypertension SBP 110s-140s  Will titrate BP regimen for now  Otherwise follow closely    Diabetes mellitus type II, non insulin  dependent (HCC) Blood sugar in 150s  SSI  A1c  Monitor        Advance Care Planning:   Code Status: Limited: Do not attempt resuscitation (DNR) -DNR-LIMITED -Do Not  Intubate/DNI    Consults: None   Family Communication: Wife at the bedside   Severity of Illness: The appropriate patient status for this patient is OBSERVATION. Observation status is judged to be reasonable and necessary in order to provide the required intensity of service to ensure the patient's safety. The patient's presenting symptoms, physical exam findings, and initial radiographic and laboratory data in the context of their medical condition is felt to place them at decreased risk for further clinical deterioration. Furthermore, it is anticipated that the patient will be medically stable for discharge from the hospital within 2 midnights of admission.   Author: Elspeth JINNY Masters, MD 12/19/2023 9:02 AM  For on call review [email or web address].      [1] No Known Allergies

## 2023-12-19 NOTE — Assessment & Plan Note (Signed)
 Blood sugar in 150s  SSI  A1c  Monitor

## 2023-12-19 NOTE — Assessment & Plan Note (Addendum)
 Noted lower extremity weakness x 2 days- now resolved  No reported falls at home Was able to ambulate in the room for 40 to 50 feet without any difficulty on assessment Nonfocal neuromusculoskeletal exam Deferring any imaging given clinical resolution at presentation Will otherwise monitor for now Suspect transient weakness in setting of UTI and?  Pneumonia PT OT evaluation Monitor

## 2023-12-19 NOTE — Assessment & Plan Note (Signed)
 SBP 110s-140s  Will titrate BP regimen for now  Otherwise follow closely

## 2023-12-19 NOTE — ED Notes (Signed)
 Pt back from CT

## 2023-12-19 NOTE — Assessment & Plan Note (Signed)
 Baseline Cr 1.4-1.7  Cr 1.47 today  Appears euvolemic  Monitor

## 2023-12-19 NOTE — ED Notes (Signed)
Report given to 300 nurse 

## 2023-12-19 NOTE — Assessment & Plan Note (Signed)
-  s/p CABG x 5 in 1996  No active CP at present  EKG NSR and multiform PVCs, QTc 487  Continue current GDMT with ASA 81 mg Hold crestor  pending CK level in setting of LE weakness

## 2023-12-19 NOTE — Assessment & Plan Note (Signed)
 Noted baseline sinus node dysfunction status post permanent pacemaker 2022 Rate stable at present Monitor

## 2023-12-19 NOTE — TOC CM/SW Note (Signed)
 Transition of Care [facility]) - Inpatient Brief Assessment   Patient Details  Name: Philip Kerr MRN: [id number] Date of Birth: 01-22-1933  Transition of [facility]) CM/SW Contact:    Lucie Lunger, LCSWA Phone Number: 12/19/2023, 10:11 AM   Clinical Narrative: Transition of Care Department [facility]) has reviewed patient and no TOC needs have been identified at this time. We will continue to monitor patient advancement through interdiciplinary progression rounds. If new patient transition needs arise, please place a TOC consult.  Transition of Care Asessment: Insurance and Status: Insurance coverage has been reviewed Patient has primary care physician: Yes Home environment has been reviewed: From home with wife Prior level of function:: Independent Prior/Current Home Services: No current home services Social Drivers of Health Review: SDOH reviewed no interventions necessary Readmission risk has been reviewed: Yes Transition of care needs: no transition of care needs at this time

## 2023-12-20 DIAGNOSIS — J181 Lobar pneumonia, unspecified organism: Secondary | ICD-10-CM

## 2023-12-20 DIAGNOSIS — N3000 Acute cystitis without hematuria: Secondary | ICD-10-CM

## 2023-12-20 DIAGNOSIS — N1832 Chronic kidney disease, stage 3b: Secondary | ICD-10-CM

## 2023-12-20 LAB — FOLATE: Folate: 5.2 ng/mL — ABNORMAL LOW (ref >5.9)

## 2023-12-20 LAB — CBC
HCT: 38.1 % — ABNORMAL LOW (ref 39.0–52.0)
Hemoglobin: 12.7 g/dL — ABNORMAL LOW (ref 13.0–17.0)
MCH: 33.4 pg (ref 26.0–34.0)
MCHC: 33.3 g/dL (ref 30.0–36.0)
MCV: 100.3 fL — ABNORMAL HIGH (ref 80.0–100.0)
Platelets: 164 K/uL (ref 150–400)
RBC: 3.8 MIL/uL — ABNORMAL LOW (ref 4.22–5.81)
RDW: 13.3 % (ref 11.5–15.5)
WBC: 7.3 K/uL (ref 4.0–10.5)
nRBC: 0 % (ref 0.0–0.2)

## 2023-12-20 LAB — GLUCOSE, CAPILLARY
Glucose-Capillary: 86 mg/dL (ref 70–99)
Glucose-Capillary: 152 mg/dL — ABNORMAL HIGH (ref 70–99)
Glucose-Capillary: 103 mg/dL — ABNORMAL HIGH (ref 70–99)
Glucose-Capillary: 151 mg/dL — ABNORMAL HIGH (ref 70–99)

## 2023-12-20 LAB — URINE CULTURE

## 2023-12-20 LAB — MAGNESIUM: Magnesium: 1.9 mg/dL (ref 1.7–2.4)

## 2023-12-20 LAB — COMPREHENSIVE METABOLIC PANEL WITH GFR
ALT: 8 U/L (ref 0–44)
AST: 18 U/L (ref 15–41)
Albumin: 3.5 g/dL (ref 3.5–5.0)
Alkaline Phosphatase: 69 U/L (ref 38–126)
Anion gap: 7 (ref 5–15)
BUN: 18 mg/dL (ref 8–23)
CO2: 28 mmol/L (ref 22–32)
Calcium: 8.9 mg/dL (ref 8.9–10.3)
Chloride: 106 mmol/L (ref 98–111)
Creatinine, Ser: 1.43 mg/dL — ABNORMAL HIGH (ref 0.61–1.24)
GFR, Estimated: 47 mL/min — ABNORMAL LOW (ref >60)
Glucose, Bld: 87 mg/dL (ref 70–99)
Potassium: 4 mmol/L (ref 3.5–5.1)
Sodium: 140 mmol/L (ref 135–145)
Total Bilirubin: 0.5 mg/dL (ref 0.0–1.2)
Total Protein: 6.2 g/dL — ABNORMAL LOW (ref 6.5–8.1)

## 2023-12-20 LAB — TSH: TSH: 2.51 u[IU]/mL (ref 0.350–4.500)

## 2023-12-20 LAB — RESPIRATORY PANEL BY PCR

## 2023-12-20 LAB — PHOSPHORUS: Phosphorus: 2.1 mg/dL — ABNORMAL LOW (ref 2.5–4.6)

## 2023-12-20 LAB — PROCALCITONIN: Procalcitonin: <0.1 ng/mL

## 2023-12-20 LAB — VITAMIN B12: Vitamin B-12: 624 pg/mL (ref 180–914)

## 2023-12-20 LAB — T4, FREE: Free T4: 0.89 ng/dL (ref 0.61–1.12)

## 2023-12-20 LAB — HIV ANTIBODY (ROUTINE TESTING W REFLEX): HIV Screen 4th Generation wRfx: NONREACTIVE

## 2023-12-20 MED ORDER — CHLORHEXIDINE GLUCONATE CLOTH 2 % EX PADS
6.0000 | MEDICATED_PAD | Freq: Every day | CUTANEOUS | Status: DC
  Administered 2023-12-20 – 2023-12-21 (×2): 6 via TOPICAL

## 2023-12-20 MED ADMIN — Tamsulosin HCl Cap 0.4 MG: 0.4 mg | ORAL | NDC 65862059801

## 2023-12-20 MED ADMIN — Aspirin Tab Delayed Release 81 MG: 81 mg | ORAL | NDC 10135072962

## 2023-12-20 MED FILL — Tamsulosin HCl Cap 0.4 MG: 0.4000 mg | ORAL | Qty: 1 | Status: AC

## 2023-12-20 NOTE — Plan of Care (Signed)

## 2023-12-20 NOTE — Progress Notes (Signed)
 PROGRESS NOTE  PAL SHELL FMW:[id number] DOB: 01-26-1933 DOA: 12/18/2023 PCP: Norleen Lynwood ORN, MD  Brief History:  89 year old male with a history of hypertension, hyperlipidemia, diabetes mellitus type 2, sinus node dysfunction status post PPM, coronary artery disease, hyperlipidemia, severe mitral regurgitation, CKD, left bundle branch block, nonsustained V. tach presenting with 1 to 2-day history of worsening generalized weakness, coughing, congestion.  The patient also had some nausea and vomiting.  He has had some loose stools. At baseline, the patient is independent with his ADLs and able to ambulate without difficulty.  Because of his generalized weakness he was having difficulty with ambulation. Notably, the patient has a chronic indwelling Foley catheter followed by [facility].  It was last exchanged on 12/13/2023.  No reported fevers or chills. Wife does report change in urine nephrology in the Foley bag. No focal hemiparesis or confusion. No reported slurred speech.  In the ED, the patient was hemodynamically stable with low-grade temperature of 99.0 F.  Oxygen saturation was 96% room air.  WBC 8.3, hemoglobin 13.1, platelets 185.  Sodium 140, potassium 3.9, bicarbonate 22, serum creatinine 1.47.  CT chest showed airspace opacity in the right lower lobe with peripheral airway obstructions.  There is a more consolidative area in the left lower lobe.  CT abdomen and pelvis was negative for bowel obstruction.  There is a large left inguinal hernia with sigmoid colon and right inguinal hernia with a portion of the bladder.  There is sigmoid diverticulosis.   Assessment/Plan: Lobar pneumonia - 12/18/2023 CT chest as discussed above - Continue ceftriaxone  and azithromycin  - Follow blood cultures  UTI/CAUTI - Patient has a indwelling Foley catheter - Foley catheter changed in the emergency department - UA 21-50 WBC - Continue ceftriaxone  pending urine  culture  Generalized weakness - PT evaluation - Secondary to infectious process - B12 - Folic acid  - TSH  CKD stage IIIb - Baseline creatinine 1.4-1.7 - Monitor BMP  Sinus node dysfunction - Post PPM 2022 - Follow-up Dr. Verlin  Chronic HFrEF -09/02/2023 echo EF 25-30%, global HK, normal RVF, moderate to severe MR, mild AI - Clinically euvolemic  Coronary artery disease - No chest pain presently -s/p CABG x 5 in 1996  - Continue aspirin  81 mg daily - Continue statin  Controlled diabetes mellitus type 2 -12/19/2023 hemoglobin A1c 6.4 -NovoLog  sliding scale-  Elevated troponin -Secondary to demand ischemia - No chest pain presently - Troponin 48>> 47  Essential hypertension - Holding amlodipine  and monitor BP  Cognitive impairment -Baseline mild cognitive issues in discussion with wife Continue home regimen including Seroquel  and trazodone  Monitor        Family Communication:   no Family at bedside  Consultants:  none  Code Status:  DNR  DVT Prophylaxis:    Lovenox    Procedures: As Listed in Progress Note Above  Antibiotics: Ceftriaxone  12/11>> Azithro 12/11>>     Subjective: Patient denies fevers, chills, headache, chest pain, dyspnea, nausea, vomiting, diarrhea, abdominal pain, dysuria, hematuria, hematochezia, and melena.   Objective: Vitals:   12/19/23 1548 12/19/23 2009 12/20/23 0044 12/20/23 0403  BP: 117/67 115/63 118/65 (!) 123/43  Pulse: 83 85 91 83  Resp: 20 18 19 18   Temp:  98.9 F (37.2 C) 97.8 F (36.6 C) 99 F (37.2 C)  TempSrc:  Oral Axillary Oral  SpO2: 98% 96% 96% 94%  Weight:      Height:  Intake/Output Summary (Last 24 hours) at 12/20/2023 0848 Last data filed at 12/20/2023 0606 Gross per 24 hour  Intake 370 ml  Output 1450 ml  Net -1080 ml   Weight change:  Exam:  General:  Pt is alert, follows commands appropriately, not in acute distress HEENT: No icterus, No thrush, No neck mass,  [place]/AT Cardiovascular: RRR, S1/S2, no rubs, no gallops Respiratory: Scattered bilateral rales.  No wheezing.  Good air movement Abdomen: Soft/+BS, non tender, non distended, no guarding Extremities: No edema, No lymphangitis, No petechiae, No rashes, no synovitis   Data Reviewed: I have personally reviewed following labs and imaging studies Basic Metabolic Panel: Recent Labs  Lab 12/18/23 1734 12/20/23 0630  NA 140 140  K 3.9 4.0  CL 105 106  CO2 22 28  GLUCOSE 154* 87  BUN 20 18  CREATININE 1.47* 1.43*  CALCIUM  8.8* 8.9  MG  --  1.9  PHOS  --  2.1*   Liver Function Tests: Recent Labs  Lab 12/18/23 1734 12/20/23 0630  AST 17 18  ALT 7 8  ALKPHOS 87 69  BILITOT 0.5 0.5  PROT 7.1 6.2*  ALBUMIN  3.9 3.5   No results for input(s): LIPASE, AMYLASE in the last 168 hours. No results for input(s): AMMONIA in the last 168 hours. Coagulation Profile: No results for input(s): INR, PROTIME in the last 168 hours. CBC: Recent Labs  Lab 12/18/23 1734 12/20/23 0630  WBC 8.3 7.3  NEUTROABS 4.8  --   HGB 13.1 12.7*  HCT 40.0 38.1*  MCV 100.5* 100.3*  PLT 185 164   Cardiac Enzymes: No results for input(s): CKTOTAL, CKMB, CKMBINDEX, TROPONINI in the last 168 hours. BNP: Invalid input(s): POCBNP CBG: Recent Labs  Lab 12/19/23 0938 12/19/23 1613 12/19/23 2131 12/20/23 0807  GLUCAP 139* 109* 108* 86   HbA1C: Recent Labs    12/19/23 0933  HGBA1C 6.4*   Urine analysis:    Component Value Date/Time   COLORURINE YELLOW 12/18/2023 1907   APPEARANCEUR HAZY (A) 12/18/2023 1907   LABSPEC 1.012 12/18/2023 1907   PHURINE 5.0 12/18/2023 1907   GLUCOSEU NEGATIVE 12/18/2023 1907   GLUCOSEU NEGATIVE 10/12/2022 1442   HGBUR SMALL (A) 12/18/2023 1907   BILIRUBINUR NEGATIVE 12/18/2023 1907   BILIRUBINUR negative 08/23/2017 0922   KETONESUR NEGATIVE 12/18/2023 1907   PROTEINUR NEGATIVE 12/18/2023 1907   UROBILINOGEN 0.2 10/12/2022 1442   NITRITE  POSITIVE (A) 12/18/2023 1907   LEUKOCYTESUR LARGE (A) 12/18/2023 1907   Sepsis Labs: @LABRCNTIP (procalcitonin:4,lacticidven:4) ) Recent Results (from the past 240 hours)  Urine Culture     Status: Abnormal   Collection Time: 12/18/23  7:07 PM   Specimen: Urine, Catheterized  Result Value Ref Range Status   Specimen Description   Final    URINE, CATHETERIZED Performed at [facility], [address] [place], [place] [address]    Special Requests   Final    NONE Performed at [facility], [address] [place], [place] [address]    Culture MULTIPLE SPECIES PRESENT, SUGGEST RECOLLECTION (A)  Final   Report Status 12/20/2023 FINAL  Final  Resp panel by RT-PCR (RSV, Flu A&B, Covid) Anterior Nasal Swab     Status: None   Collection Time: 12/18/23  9:55 PM   Specimen: Anterior Nasal Swab  Result Value Ref Range Status   SARS Coronavirus 2 by RT PCR NEGATIVE NEGATIVE Final    Comment: (NOTE) SARS-CoV-2 target nucleic acids are NOT DETECTED.  The SARS-CoV-2 RNA is generally detectable in  upper respiratory specimens during the acute phase of infection. The lowest concentration of SARS-CoV-2 viral copies this assay can detect is 138 copies/mL. A negative result does not preclude SARS-Cov-2 infection and should not be used as the sole basis for treatment or other patient management decisions. A negative result may occur with  improper specimen collection/handling, submission of specimen other than nasopharyngeal swab, presence of viral mutation(s) within the areas targeted by this assay, and inadequate number of viral copies(<138 copies/mL). A negative result must be combined with clinical observations, patient history, and epidemiological information. The expected result is Negative.  Fact Sheet for Patients:  bloggercourse.com  Fact Sheet for Healthcare Providers:  seriousbroker.it  This test is no t yet approved or cleared by  the [place]  FDA and  has been authorized for detection and/or diagnosis of SARS-CoV-2 by FDA under an Emergency Use Authorization (EUA). This EUA will remain  in effect (meaning this test can be used) for the duration of the COVID-19 declaration under Section 564(b)(1) of the Act, 21 U.S.C.section 360bbb-3(b)(1), unless the authorization is terminated  or revoked sooner.       Influenza A by PCR NEGATIVE NEGATIVE Final   Influenza B by PCR NEGATIVE NEGATIVE Final    Comment: (NOTE) The Xpert Xpress SARS-CoV-2/FLU/RSV plus assay is intended as an aid in the diagnosis of influenza from Nasopharyngeal swab specimens and should not be used as a sole basis for treatment. Nasal washings and aspirates are unacceptable for Xpert Xpress SARS-CoV-2/FLU/RSV testing.  Fact Sheet for Patients: bloggercourse.com  Fact Sheet for Healthcare Providers: seriousbroker.it  This test is not yet approved or cleared by the [place]  FDA and has been authorized for detection and/or diagnosis of SARS-CoV-2 by FDA under an Emergency Use Authorization (EUA). This EUA will remain in effect (meaning this test can be used) for the duration of the COVID-19 declaration under Section 564(b)(1) of the Act, 21 U.S.C. section 360bbb-3(b)(1), unless the authorization is terminated or revoked.     Resp Syncytial Virus by PCR NEGATIVE NEGATIVE Final    Comment: (NOTE) Fact Sheet for Patients: bloggercourse.com  Fact Sheet for Healthcare Providers: seriousbroker.it  This test is not yet approved or cleared by the [place]  FDA and has been authorized for detection and/or diagnosis of SARS-CoV-2 by FDA under an Emergency Use Authorization (EUA). This EUA will remain in effect (meaning this test can be used) for the duration of the COVID-19 declaration under Section 564(b)(1) of the Act, 21  U.S.C. section 360bbb-3(b)(1), unless the authorization is terminated or revoked.  Performed at [facility], [address], [place], [place] [address]      Scheduled Meds:  aspirin  EC  81 mg Oral Daily   clotrimazole   Topical BID   enoxaparin  (LOVENOX ) injection  40 mg Subcutaneous Q24H   insulin  aspart  0-9 Units Subcutaneous TID WC   QUEtiapine   25 mg Oral BID   rosuvastatin   20 mg Oral Daily   Continuous Infusions:  azithromycin  Stopped (12/19/23 2309)   cefTRIAXone  (ROCEPHIN )  IV      Procedures/Studies: CT CHEST WO CONTRAST Result Date: 12/19/2023 CLINICAL DATA:  Pneumonia. EXAM: CT CHEST WITHOUT CONTRAST TECHNIQUE: Multidetector CT imaging of the chest was performed following the standard protocol without IV contrast. RADIATION DOSE REDUCTION: This exam was performed according to the departmental dose-optimization program which includes automated exposure control, adjustment of the mA and/or kV according to patient size and/or use of iterative reconstruction technique. COMPARISON:  No comparison studies available.  FINDINGS: Cardiovascular: The heart is enlarged. No substantial pericardial effusion. Status post CABG. Coronary artery calcification is evident. Moderate atherosclerotic calcification is noted in the wall of the thoracic aorta. Mediastinum/Nodes: No mediastinal lymphadenopathy. No evidence for gross hilar lymphadenopathy although assessment is limited by the lack of intravenous contrast on the current study. The esophagus has normal imaging features. There is no axillary lymphadenopathy. Lungs/Pleura: Subpleural parenchymal opacity in the anterior right apex just deep to the anterior end of the right first rib is probably chronic atelectasis or scarring. Minimal airspace opacity with peripheral airway obstruction identified in the paraspinal right lung base. More prominent consolidative disease is seen in the posterior left lower lobe, also with peripheral small airway  impaction. Clustered micro nodularity in the left upper lobe shows a tree-in-bud configuration. Small right pleural effusion. Upper Abdomen: Visualized portion of the upper abdomen shows no acute findings. Musculoskeletal: No worrisome lytic or sclerotic osseous abnormality. IMPRESSION: 1. Minimal airspace opacity with peripheral airway obstruction in the paraspinal right lung base. More prominent consolidative disease is seen in the posterior left lower lobe, also with peripheral small airway impaction. Imaging features are compatible with multifocal pneumonia. Aspiration not excluded. 2. Clustered micro nodularity in the left upper lobe shows a tree-in-bud configuration, likely infectious/inflammatory. Atypical etiology should be considered. 3. Small right pleural effusion. 4.  Aortic Atherosclerosis (ICD10-I70.0). Electronically Signed   By: Camellia Candle M.D.   On: 12/19/2023 09:18   DG Chest 2 View Result Date: 12/18/2023 EXAM: 2 VIEW(S) XRAY OF THE CHEST 12/18/2023 10:31:00 PM COMPARISON: 10/17/2022 CLINICAL HISTORY: weakness FINDINGS: LINES, TUBES AND DEVICES: Left chest wall pacemaker leads in place projecting over right atrium and ventricle. LUNGS AND PLEURA: Low lung volumes with bronchovascular crowding. Bilateral interstitial opacities. Left basilar patchy opacities. Small left pleural effusion. No pneumothorax. HEART AND MEDIASTINUM: Stable cardiomegaly. Unchanged cardiomediastinal silhouette. Median sternotomy and CABG noted. Atherosclerotic plaque. BONES AND SOFT TISSUES: Median sternotomy and CABG noted. No acute osseous abnormality. IMPRESSION: 1. Left basilar patchy opacities with small left pleural effusion, which may reflect atelectasis or superimposed infection/aspiration. Electronically signed by: Morgane Naveau MD 12/18/2023 10:36 PM EST RP Workstation: [id number]   CT ABDOMEN PELVIS W CONTRAST Result Date: 12/18/2023 EXAM: CT ABDOMEN AND PELVIS WITH CONTRAST 12/18/2023 10:23:35 PM  TECHNIQUE: CT of the abdomen and pelvis was performed with the administration of intravenous contrast. Multiplanar reformatted images are provided for review. Automated exposure control, iterative reconstruction, and/or weight-based adjustment of the mA/kV was utilized to reduce the radiation dose to as low as reasonably achievable. COMPARISON: 05/29/2021. CLINICAL HISTORY: Bowel obstruction suspected. FINDINGS: LOWER CHEST: Cardiomegaly. Pacer wires in the right heart. Scarring in the left lung base. Trace right pleural effusion. LIVER: The liver is unremarkable. GALLBLADDER AND BILE DUCTS: Gallbladder is unremarkable. No biliary ductal dilatation. SPLEEN: No acute abnormality. PANCREAS: No acute abnormality. ADRENAL GLANDS: No acute abnormality. KIDNEYS, URETERS AND BLADDER: Bilateral renal cortical thinning with numerous bilateral cysts which appear benign. Per consensus, no follow-up is needed for simple Bosniak type 1 and 2 renal cysts, unless the patient has a malignancy history or risk factors. Foley catheter present in the bladder, which is decompressed. A portion of the anterior bladder wall is within a right inguinal hernia. No stones in the kidneys or ureters. No hydronephrosis. No perinephric or periureteral stranding. GI AND BOWEL: Stomach demonstrates no acute abnormality. Sigmoid diverticulosis. A portion of the sigmoid colon is within a large left inguinal hernia. No bowel obstruction. PERITONEUM AND RETROPERITONEUM: No ascites.  No free air. VASCULATURE: Aorta is normal in caliber. Aortoiliac atherosclerosis. LYMPH NODES: No lymphadenopathy. REPRODUCTIVE ORGANS: Prostate enlargement. BONES AND SOFT TISSUES: No acute osseous abnormality. Right inguinal hernia containing a portion of the anterior bladder wall. Large left inguinal hernia containing a portion of the sigmoid colon. IMPRESSION: 1. No bowel obstruction. 2. Large left inguinal hernia containing a portion of the sigmoid colon. 3. Right  inguinal hernia containing a portion of the anterior bladder wall. 4. Cardiomegaly , aortic atherosclerosis. 5. Sigmoid diverticulosis. Electronically signed by: Franky Crease MD 12/18/2023 10:29 PM EST RP Workstation: [id number]   CT Head Wo Contrast Result Date: 12/18/2023 EXAM: CT HEAD WITHOUT 12/18/2023 10:23:35 PM TECHNIQUE: CT of the head was performed without the administration of intravenous contrast. Automated exposure control, iterative reconstruction, and/or weight based adjustment of the mA/kV was utilized to reduce the radiation dose to as low as reasonably achievable. COMPARISON: 05/22/2019 CLINICAL HISTORY: Neuro deficit, acute, stroke suspected FINDINGS: BRAIN AND VENTRICLES: No acute intracranial hemorrhage. No mass effect or midline shift. No extra-axial fluid collection. No evidence of acute infarct. No hydrocephalus. Atrophy and chronic small vessel disease throughout the deep white matter. ORBITS: No acute abnormality. SINUSES AND MASTOIDS: No acute abnormality. SOFT TISSUES AND SKULL: No acute skull fracture. No acute soft tissue abnormality. IMPRESSION: 1. No acute intracranial abnormality. 2. Chronic small vessel ischemic changes and cerebral atrophy. Electronically signed by: Franky Crease MD 12/18/2023 10:26 PM EST RP Workstation: [id number]   CUP PACEART REMOTE DEVICE CHECK Result Date: 12/11/2023 Pacemaker: Scheduled remote reviewed. Normal device function.  Presenting rhythm: AS/VS 8 NSVT, V>A, 9-24 beats, V-rates 182-273 bpm.  (#31 09/11/2023) 24 beats 188 bpm, sent to triage Next remote transmission per protocol. ML, CVRS   Alm Schneider, DO  [facility]  If 7PM-7AM, please contact night-coverage [email or web address] Password [email or web address] 12/20/2023, 8:48 AM   LOS: 2 days

## 2023-12-20 NOTE — Hospital Course (Addendum)
 89 year old male with a history of hypertension, hyperlipidemia, diabetes mellitus type 2, sinus node dysfunction status post PPM, coronary artery disease, hyperlipidemia, severe mitral regurgitation, CKD, left bundle branch block, nonsustained V. tach presenting with 1 to 2-day history of worsening generalized weakness, coughing, congestion.  The patient also had some nausea and vomiting.  He has had some loose stools. At baseline, the patient is independent with his ADLs and able to ambulate without difficulty.  Because of his generalized weakness he was having difficulty with ambulation. Notably, the patient has a chronic indwelling Foley catheter followed by [facility].  It was last exchanged on 12/13/2023.  No reported fevers or chills. Wife does report change in urine nephrology in the Foley bag. No focal hemiparesis or confusion. No reported slurred speech.  In the ED, the patient was hemodynamically stable with low-grade temperature of 99.0 F.  Oxygen saturation was 96% room air.  WBC 8.3, hemoglobin 13.1, platelets 185.  Sodium 140, potassium 3.9, bicarbonate 22, serum creatinine 1.47.  CT chest showed airspace opacity in the right lower lobe with peripheral airway obstructions.  There is a more consolidative area in the left lower lobe.  CT abdomen and pelvis was negative for bowel obstruction.  There is a large left inguinal hernia with sigmoid colon and right inguinal hernia with a portion of the bladder.  There is sigmoid diverticulosis.

## 2023-12-20 NOTE — Progress Notes (Signed)
 Mobility Specialist Progress Note:    12/20/23 0940  Mobility  Activity Ambulated with assistance  Level of Assistance Contact guard assist, steadying assist  Assistive Device None  Distance Ambulated (ft) 15 ft  Range of Motion/Exercises Active;All extremities  Activity Response Tolerated well  Mobility Referral Yes  Mobility visit 1 Mobility  Mobility Specialist Start Time (ACUTE ONLY) 0940  Mobility Specialist Stop Time (ACUTE ONLY) 1000  Mobility Specialist Time Calculation (min) (ACUTE ONLY) 20 min   Pt received in bed, requesting assistance to bathroom. Required CGA to stand and ambulate with no AD. Tolerated well, ax throughout. Left in bathroom, family in room. All needs met.  Latima Hamza Mobility Specialist Please contact via Special Educational Needs Teacher or  Rehab office at [phone]

## 2023-12-21 DIAGNOSIS — N3001 Acute cystitis with hematuria: Secondary | ICD-10-CM

## 2023-12-21 DIAGNOSIS — I5022 Chronic systolic (congestive) heart failure: Secondary | ICD-10-CM | POA: Diagnosis not present

## 2023-12-21 DIAGNOSIS — N1832 Chronic kidney disease, stage 3b: Secondary | ICD-10-CM | POA: Diagnosis not present

## 2023-12-21 DIAGNOSIS — J181 Lobar pneumonia, unspecified organism: Secondary | ICD-10-CM | POA: Diagnosis not present

## 2023-12-21 LAB — CBC
HCT: 35.1 % — ABNORMAL LOW (ref 39.0–52.0)
Hemoglobin: 11.7 g/dL — ABNORMAL LOW (ref 13.0–17.0)
MCH: 33 pg (ref 26.0–34.0)
MCHC: 33.3 g/dL (ref 30.0–36.0)
MCV: 98.9 fL (ref 80.0–100.0)
Platelets: 164 K/uL (ref 150–400)
RBC: 3.55 MIL/uL — ABNORMAL LOW (ref 4.22–5.81)
RDW: 13.2 % (ref 11.5–15.5)
WBC: 8.8 K/uL (ref 4.0–10.5)
nRBC: 0 % (ref 0.0–0.2)

## 2023-12-21 LAB — BASIC METABOLIC PANEL WITH GFR
Anion gap: 8 (ref 5–15)
BUN: 14 mg/dL (ref 8–23)
CO2: 25 mmol/L (ref 22–32)
Calcium: 8.3 mg/dL — ABNORMAL LOW (ref 8.9–10.3)
Chloride: 104 mmol/L (ref 98–111)
Creatinine, Ser: 1.41 mg/dL — ABNORMAL HIGH (ref 0.61–1.24)
GFR, Estimated: 48 mL/min — ABNORMAL LOW (ref >60)
Glucose, Bld: 89 mg/dL (ref 70–99)
Potassium: 3.6 mmol/L (ref 3.5–5.1)
Sodium: 137 mmol/L (ref 135–145)

## 2023-12-21 LAB — GLUCOSE, CAPILLARY
Glucose-Capillary: 95 mg/dL (ref 70–99)
Glucose-Capillary: 206 mg/dL — ABNORMAL HIGH (ref 70–99)

## 2023-12-21 LAB — MAGNESIUM: Magnesium: 1.8 mg/dL (ref 1.7–2.4)

## 2023-12-21 MED ORDER — CEFDINIR 300 MG PO CAPS: 300.0000 mg | ORAL_CAPSULE | Freq: Two times a day (BID) | ORAL | 0 refills | Status: DC

## 2023-12-21 MED ORDER — AZITHROMYCIN 500 MG PO TABS: 500.0000 mg | ORAL_TABLET | Freq: Every day | ORAL | 0 refills | Status: DC

## 2023-12-21 MED ORDER — AZITHROMYCIN 250 MG PO TABS: 500.0000 mg | ORAL_TABLET | Freq: Every day | ORAL | Status: DC

## 2023-12-21 MED ORDER — FOLIC ACID 1 MG PO TABS
1.0000 mg | ORAL_TABLET | Freq: Every day | ORAL | Status: DC
  Administered 2023-12-21: 1 mg via ORAL
  Filled 2023-12-21: qty 1

## 2023-12-21 MED ORDER — FOLIC ACID 1 MG PO TABS: 1.0000 mg | ORAL_TABLET | Freq: Every day | ORAL | Status: DC

## 2023-12-21 MED ORDER — FOSFOMYCIN TROMETHAMINE 3 G PO PACK
3.0000 g | PACK | Freq: Once | ORAL | Status: AC
  Administered 2023-12-21: 3 g via ORAL
  Filled 2023-12-21: qty 3

## 2023-12-21 MED ADMIN — Tamsulosin HCl Cap 0.4 MG: 0.4 mg | ORAL | NDC 65862059801

## 2023-12-21 MED ADMIN — Aspirin Tab Delayed Release 81 MG: 81 mg | ORAL | NDC 10135072962

## 2023-12-21 NOTE — Evaluation (Signed)
 Physical Therapy Evaluation Patient Details Name: Philip Kerr MRN: [id number] DOB: 12-02-1933 Today's Date: 12/21/2023  History of Present Illness  89 year old male with a history of hypertension, hyperlipidemia, diabetes mellitus type 2, sinus node dysfunction status post PPM, coronary artery disease, hyperlipidemia, severe mitral regurgitation, CKD, left bundle branch block, nonsustained V. tach presenting with 1 to 2-day history of worsening generalized weakness, coughing, congestion.  The patient also had some nausea and vomiting.  He has had some loose stools.  At baseline, the patient is independent with his ADLs and able to ambulate without difficulty.  Because of his generalized weakness he was having difficulty with ambulation.  Notably, the patient has a chronic indwelling Foley catheter followed by [facility].  It was last exchanged on 12/13/2023.   Clinical Impression  Patient demonstrates decreased LE strength, abnormal gait pattern, and impaired balance. Patient is modified independent with bed mobility, functional transfers and ambulation this date. Patient also demonstrates slight imbalance towards the end of ambulation today, pt would likely benefit from cane for decreased falls risk although not receptive to AD this date. Patient requires education on role of PT and recommendations. Patient discharged from acute physical therapy to care of nursing for ambulation daily as tolerated for length of stay and will receive all further needs at next venue of care.          If plan is discharge home, recommend the following: A little help with walking and/or transfers;A little help with bathing/dressing/bathroom;Assist for transportation;Help with stairs or ramp for entrance   Can travel by private vehicle        Equipment Recommendations Cane  Recommendations for Other Services       Functional Status Assessment Patient has had a recent decline in their functional status  and demonstrates the ability to make significant improvements in function in a reasonable and predictable amount of time.     Precautions / Restrictions Precautions Precautions: Fall Recall of Precautions/Restrictions: Intact Restrictions Weight Bearing Restrictions Per Provider Order: No      Mobility  Bed Mobility Overal bed mobility: Modified Independent             General bed mobility comments: increased time required    Transfers Overall transfer level: Modified independent                 General transfer comment: increased time    Ambulation/Gait Ambulation/Gait assistance: Modified independent (Device/Increase time) Gait Distance (Feet): 20 Feet Assistive device: None Gait Pattern/deviations: Decreased stride length, Shuffle, Wide base of support Gait velocity: decreased     General Gait Details: WBOS  Stairs            Wheelchair Mobility     Tilt Bed    Modified Rankin (Stroke Patients Only)       Balance Overall balance assessment: Independent                                           Pertinent Vitals/Pain Pain Assessment Pain Assessment: No/denies pain    Home Living Family/patient expects to be discharged to:: Private residence Living Arrangements: Spouse/significant other Available Help at Discharge: Family;Available 24 hours/day Type of Home: House Home Access: Stairs to enter Entrance Stairs-Rails: Right Entrance Stairs-Number of Steps: 3   Home Layout: One level Home Equipment: None      Prior Function Prior Level of Function :  Independent/Modified Independent             Mobility Comments: wife does most of the driving, community ambulator ADLs Comments: wife assists as needed     Extremity/Trunk Assessment   Upper Extremity Assessment Upper Extremity Assessment: Overall WFL for tasks assessed    Lower Extremity Assessment Lower Extremity Assessment: Generalized weakness     Cervical / Trunk Assessment Cervical / Trunk Assessment: Kyphotic  Communication   Communication Factors Affecting Communication: Hearing impaired    Cognition Arousal: Alert Behavior During Therapy: WFL for tasks assessed/performed   PT - Cognitive impairments: No apparent impairments                         Following commands: Intact       Cueing Cueing Techniques: Verbal cues     General Comments      Exercises     Assessment/Plan    PT Assessment All further PT needs can be met in the next venue of care  PT Problem List Decreased strength;Decreased mobility;Decreased activity tolerance;Decreased balance       PT Treatment Interventions      PT Goals (Current goals can be found in the Care Plan section)  Acute Rehab PT Goals Patient Stated Goal: to return home PT Goal Formulation: With patient Time For Goal Achievement: 12/23/23 Potential to Achieve Goals: Good    Frequency       Co-evaluation               AM-PAC PT 6 Clicks Mobility  Outcome Measure Help needed turning from your back to your side while in a flat bed without using bedrails?: None Help needed moving from lying on your back to sitting on the side of a flat bed without using bedrails?: None Help needed moving to and from a bed to a chair (including a wheelchair)?: None Help needed standing up from a chair using your arms (e.g., wheelchair or bedside chair)?: None Help needed to walk in hospital room?: A Little Help needed climbing 3-5 steps with a railing? : A Little 6 Click Score: 22    End of Session Equipment Utilized During Treatment: Gait belt Activity Tolerance: Patient tolerated treatment well Patient left: in bed;with bed alarm set   PT Visit Diagnosis: Other abnormalities of gait and mobility (R26.89);Muscle weakness (generalized) (M62.81)    Time: 8941-8885 PT Time Calculation (min) (ACUTE ONLY): 16 min   Charges:   PT Evaluation $PT Eval Low  Complexity: 1 Low   PT General Charges $$ ACUTE PT VISIT: 1 Visit         Lang Ada, PT, DPT [facility] Office: [phone] 11:23 AM, 12/21/2023

## 2023-12-21 NOTE — Discharge Summary (Signed)
 Physician Discharge Summary   Patient: Philip Kerr MRN: [id number] DOB: November 05, 1933  Admit date:     12/18/2023  Discharge date: 12/21/2023  Discharge Physician: Alm Homer Miller   PCP: Norleen Lynwood ORN, MD   Recommendations at discharge:   Please follow up with primary care provider within 1-2 weeks  Please repeat BMP and CBC in one week    Hospital Course: 89 year old male with a history of hypertension, hyperlipidemia, diabetes mellitus type 2, sinus node dysfunction status post PPM, coronary artery disease, hyperlipidemia, severe mitral regurgitation, CKD, left bundle branch block, nonsustained V. tach presenting with 1 to 2-day history of worsening generalized weakness, coughing, congestion.  The patient also had some nausea and vomiting.  He has had some loose stools. At baseline, the patient is independent with his ADLs and able to ambulate without difficulty.  Because of his generalized weakness he was having difficulty with ambulation. Notably, the patient has a chronic indwelling Foley catheter followed by [facility].  It was last exchanged on 12/13/2023.  No reported fevers or chills. Wife does report change in urine nephrology in the Foley bag. No focal hemiparesis or confusion. No reported slurred speech.  In the ED, the patient was hemodynamically stable with low-grade temperature of 99.0 F.  Oxygen saturation was 96% room air.  WBC 8.3, hemoglobin 13.1, platelets 185.  Sodium 140, potassium 3.9, bicarbonate 22, serum creatinine 1.47.  CT chest showed airspace opacity in the right lower lobe with peripheral airway obstructions.  There is a more consolidative area in the left lower lobe.  CT abdomen and pelvis was negative for bowel obstruction.  There is a large left inguinal hernia with sigmoid colon and right inguinal hernia with a portion of the bladder.  There is sigmoid diverticulosis.  Assessment and Plan:  Lobar pneumonia - 12/18/2023 CT chest as discussed above -  Continue ceftriaxone  and azithromycin  -d/c home with cefdinir  and azithro x 3 more days - viral respiratory panel>>positive for parainfluenza 3 virus - COVID/RSV/Flu neg   UTI/CAUTI - Patient has a indwelling Foley catheter - Foley catheter changed in the emergency department 12/11 - UA 21-50 WBC - Continue ceftriaxone  pending urine culture - culture shows multiple organisms - pt received 3 days ceftriaxone  during hospitalization and given a dose fosfomycin  on day of d/c   Generalized weakness - PT evaluation - Secondary to infectious process - B12--624 - Folic acid  5.2>>supplement - TSH--2.510   CKD stage IIIb - Baseline creatinine 1.4-1.7 - Monitor BMP - serum creatinine 1.41 on day of dc   Sinus node dysfunction - Post PPM 2022 - Follow-up Dr. Verlin   Chronic HFrEF -09/02/2023 echo EF 25-30%, global HK, normal RVF, moderate to severe MR, mild AI - Clinically euvolemic - restart lasix  after d/c   Coronary artery disease - No chest pain presently -s/p CABG x 5 in 1996  - Continue aspirin  81 mg daily - Continue statin   Controlled diabetes mellitus type 2 -12/19/2023 hemoglobin A1c 6.4 -NovoLog  sliding scale- - CBGs remained controlled   Elevated troponin -Secondary to demand ischemia - No chest pain presently - Troponin 48>> 47   Essential hypertension - Holding amlodipine  and monitor BP - will not restart amlodipine  as his BP remains well controlled off amlodipine  - follow up PCP for BP check   Cognitive impairment -Baseline mild cognitive issues in discussion with wife Continue home regimen including Seroquel  and trazodone  Monitor        Consultants: none Procedures performed: none  Disposition:  Home Diet recommendation:  Regular diet DISCHARGE MEDICATION: Allergies as of 12/21/2023   No Known Allergies      Medication List     STOP taking these medications    amLODipine  5 MG tablet Commonly known as: NORVASC        TAKE these  medications    acetaminophen  325 MG tablet Commonly known as: TYLENOL  Take 650 mg by mouth every 6 (six) hours as needed for moderate pain or headache.   aspirin  EC 81 MG tablet Take 1 tablet (81 mg total) by mouth daily. Swallow whole.   azithromycin  500 MG tablet Commonly known as: ZITHROMAX  Take 1 tablet (500 mg total) by mouth daily.   cefdinir  300 MG capsule Commonly known as: OMNICEF  Take 1 capsule (300 mg total) by mouth 2 (two) times daily.   Cinnamon 500 MG capsule Take 1,000 mg by mouth daily.   diphenhydramine-acetaminophen  25-500 MG Tabs tablet Commonly known as: TYLENOL  PM Take 2 tablets by mouth at bedtime.   docusate sodium  250 MG capsule Commonly known as: COLACE Take 250 mg by mouth daily as needed for mild constipation.   doxazosin  2 MG tablet Commonly known as: CARDURA  TAKE 1 TABLET BY MOUTH EVERYDAY AT BEDTIME   folic acid  1 MG tablet Commonly known as: FOLVITE  Take 1 tablet (1 mg total) by mouth daily.   furosemide  20 MG tablet Commonly known as: LASIX  TAKE 1 TABLET BY MOUTH EVERY DAY   QUEtiapine  50 MG tablet Commonly known as: SEROQUEL  TAKE 1/2 TAB BY MOUTH IN THE AM, AND 1 TAB AT BEDTIME   rosuvastatin  20 MG tablet Commonly known as: CRESTOR  TAKE 1 TABLET BY MOUTH EVERY DAY   tamsulosin  0.4 MG Caps capsule Commonly known as: FLOMAX  TAKE 1 CAPSULE BY MOUTH EVERY DAY AFTER SUPPER   traZODone  50 MG tablet Commonly known as: DESYREL  TAKE 1/2 TO 1 TABLET BY MOUTH AT BEDTIME AS NEEDED FOR SLEEP   vitamin C 1000 MG tablet Take 1,000 mg by mouth daily.   Vitamin D3 10 MCG (400 UNIT) tablet Take 400 Units by mouth daily.   vitamin E 180 MG (400 UNITS) capsule Take 400 Units by mouth daily.        Discharge Exam: Filed Weights   12/18/23 1714  Weight: 67.1 kg   HEENT:  [place]/AT, No thrush, no icterus CV:  RRR, no rub, no S3, no S4 Lung:  bibasilar rales.  No wheeze Abd:  soft/+BS, NT Ext:  No edema, no lymphangitis, no  synovitis, no rash   Condition at discharge: stable  The results of significant diagnostics from this hospitalization (including imaging, microbiology, ancillary and laboratory) are listed below for reference.   Imaging Studies: CT CHEST WO CONTRAST Result Date: 12/19/2023 CLINICAL DATA:  Pneumonia. EXAM: CT CHEST WITHOUT CONTRAST TECHNIQUE: Multidetector CT imaging of the chest was performed following the standard protocol without IV contrast. RADIATION DOSE REDUCTION: This exam was performed according to the departmental dose-optimization program which includes automated exposure control, adjustment of the mA and/or kV according to patient size and/or use of iterative reconstruction technique. COMPARISON:  No comparison studies available. FINDINGS: Cardiovascular: The heart is enlarged. No substantial pericardial effusion. Status post CABG. Coronary artery calcification is evident. Moderate atherosclerotic calcification is noted in the wall of the thoracic aorta. Mediastinum/Nodes: No mediastinal lymphadenopathy. No evidence for gross hilar lymphadenopathy although assessment is limited by the lack of intravenous contrast on the current study. The esophagus has normal imaging features. There is no axillary lymphadenopathy. Lungs/Pleura:  Subpleural parenchymal opacity in the anterior right apex just deep to the anterior end of the right first rib is probably chronic atelectasis or scarring. Minimal airspace opacity with peripheral airway obstruction identified in the paraspinal right lung base. More prominent consolidative disease is seen in the posterior left lower lobe, also with peripheral small airway impaction. Clustered micro nodularity in the left upper lobe shows a tree-in-bud configuration. Small right pleural effusion. Upper Abdomen: Visualized portion of the upper abdomen shows no acute findings. Musculoskeletal: No worrisome lytic or sclerotic osseous abnormality. IMPRESSION: 1. Minimal  airspace opacity with peripheral airway obstruction in the paraspinal right lung base. More prominent consolidative disease is seen in the posterior left lower lobe, also with peripheral small airway impaction. Imaging features are compatible with multifocal pneumonia. Aspiration not excluded. 2. Clustered micro nodularity in the left upper lobe shows a tree-in-bud configuration, likely infectious/inflammatory. Atypical etiology should be considered. 3. Small right pleural effusion. 4.  Aortic Atherosclerosis (ICD10-I70.0). Electronically Signed   By: Camellia Candle M.D.   On: 12/19/2023 09:18   DG Chest 2 View Result Date: 12/18/2023 EXAM: 2 VIEW(S) XRAY OF THE CHEST 12/18/2023 10:31:00 PM COMPARISON: 10/17/2022 CLINICAL HISTORY: weakness FINDINGS: LINES, TUBES AND DEVICES: Left chest wall pacemaker leads in place projecting over right atrium and ventricle. LUNGS AND PLEURA: Low lung volumes with bronchovascular crowding. Bilateral interstitial opacities. Left basilar patchy opacities. Small left pleural effusion. No pneumothorax. HEART AND MEDIASTINUM: Stable cardiomegaly. Unchanged cardiomediastinal silhouette. Median sternotomy and CABG noted. Atherosclerotic plaque. BONES AND SOFT TISSUES: Median sternotomy and CABG noted. No acute osseous abnormality. IMPRESSION: 1. Left basilar patchy opacities with small left pleural effusion, which may reflect atelectasis or superimposed infection/aspiration. Electronically signed by: Morgane Naveau MD 12/18/2023 10:36 PM EST RP Workstation: [id number]   CT ABDOMEN PELVIS W CONTRAST Result Date: 12/18/2023 EXAM: CT ABDOMEN AND PELVIS WITH CONTRAST 12/18/2023 10:23:35 PM TECHNIQUE: CT of the abdomen and pelvis was performed with the administration of intravenous contrast. Multiplanar reformatted images are provided for review. Automated exposure control, iterative reconstruction, and/or weight-based adjustment of the mA/kV was utilized to reduce the radiation dose to  as low as reasonably achievable. COMPARISON: 05/29/2021. CLINICAL HISTORY: Bowel obstruction suspected. FINDINGS: LOWER CHEST: Cardiomegaly. Pacer wires in the right heart. Scarring in the left lung base. Trace right pleural effusion. LIVER: The liver is unremarkable. GALLBLADDER AND BILE DUCTS: Gallbladder is unremarkable. No biliary ductal dilatation. SPLEEN: No acute abnormality. PANCREAS: No acute abnormality. ADRENAL GLANDS: No acute abnormality. KIDNEYS, URETERS AND BLADDER: Bilateral renal cortical thinning with numerous bilateral cysts which appear benign. Per consensus, no follow-up is needed for simple Bosniak type 1 and 2 renal cysts, unless the patient has a malignancy history or risk factors. Foley catheter present in the bladder, which is decompressed. A portion of the anterior bladder wall is within a right inguinal hernia. No stones in the kidneys or ureters. No hydronephrosis. No perinephric or periureteral stranding. GI AND BOWEL: Stomach demonstrates no acute abnormality. Sigmoid diverticulosis. A portion of the sigmoid colon is within a large left inguinal hernia. No bowel obstruction. PERITONEUM AND RETROPERITONEUM: No ascites. No free air. VASCULATURE: Aorta is normal in caliber. Aortoiliac atherosclerosis. LYMPH NODES: No lymphadenopathy. REPRODUCTIVE ORGANS: Prostate enlargement. BONES AND SOFT TISSUES: No acute osseous abnormality. Right inguinal hernia containing a portion of the anterior bladder wall. Large left inguinal hernia containing a portion of the sigmoid colon. IMPRESSION: 1. No bowel obstruction. 2. Large left inguinal hernia containing a portion of the sigmoid colon.  3. Right inguinal hernia containing a portion of the anterior bladder wall. 4. Cardiomegaly , aortic atherosclerosis. 5. Sigmoid diverticulosis. Electronically signed by: Franky Crease MD 12/18/2023 10:29 PM EST RP Workstation: [id number]   CT Head Wo Contrast Result Date: 12/18/2023 EXAM: CT HEAD WITHOUT  12/18/2023 10:23:35 PM TECHNIQUE: CT of the head was performed without the administration of intravenous contrast. Automated exposure control, iterative reconstruction, and/or weight based adjustment of the mA/kV was utilized to reduce the radiation dose to as low as reasonably achievable. COMPARISON: 05/22/2019 CLINICAL HISTORY: Neuro deficit, acute, stroke suspected FINDINGS: BRAIN AND VENTRICLES: No acute intracranial hemorrhage. No mass effect or midline shift. No extra-axial fluid collection. No evidence of acute infarct. No hydrocephalus. Atrophy and chronic small vessel disease throughout the deep white matter. ORBITS: No acute abnormality. SINUSES AND MASTOIDS: No acute abnormality. SOFT TISSUES AND SKULL: No acute skull fracture. No acute soft tissue abnormality. IMPRESSION: 1. No acute intracranial abnormality. 2. Chronic small vessel ischemic changes and cerebral atrophy. Electronically signed by: Franky Crease MD 12/18/2023 10:26 PM EST RP Workstation: [id number]   CUP PACEART REMOTE DEVICE CHECK Result Date: 12/11/2023 Pacemaker: Scheduled remote reviewed. Normal device function.  Presenting rhythm: AS/VS 8 NSVT, V>A, 9-24 beats, V-rates 182-273 bpm.  (#31 09/11/2023) 24 beats 188 bpm, sent to triage Next remote transmission per protocol. ML, CVRS   Microbiology: Results for orders placed or performed during the hospital encounter of 12/18/23  Urine Culture     Status: Abnormal   Collection Time: 12/18/23  7:07 PM   Specimen: Urine, Catheterized  Result Value Ref Range Status   Specimen Description   Final    URINE, CATHETERIZED Performed at [facility] [address], [place], [place] [address]    Special Requests   Final    NONE Performed at [facility], [address] [place], [place] [address]    Culture MULTIPLE SPECIES PRESENT, SUGGEST RECOLLECTION (A)  Final   Report Status 12/20/2023 FINAL  Final  Resp panel by RT-PCR (RSV, Flu A&B, Covid) Anterior Nasal Swab     Status:  None   Collection Time: 12/18/23  9:55 PM   Specimen: Anterior Nasal Swab  Result Value Ref Range Status   SARS Coronavirus 2 by RT PCR NEGATIVE NEGATIVE Final    Comment: (NOTE) SARS-CoV-2 target nucleic acids are NOT DETECTED.  The SARS-CoV-2 RNA is generally detectable in upper respiratory specimens during the acute phase of infection. The lowest concentration of SARS-CoV-2 viral copies this assay can detect is 138 copies/mL. A negative result does not preclude SARS-Cov-2 infection and should not be used as the sole basis for treatment or other patient management decisions. A negative result may occur with  improper specimen collection/handling, submission of specimen other than nasopharyngeal swab, presence of viral mutation(s) within the areas targeted by this assay, and inadequate number of viral copies(<138 copies/mL). A negative result must be combined with clinical observations, patient history, and epidemiological information. The expected result is Negative.  Fact Sheet for Patients:  bloggercourse.com  Fact Sheet for Healthcare Providers:  seriousbroker.it  This test is no t yet approved or cleared by the [place]  FDA and  has been authorized for detection and/or diagnosis of SARS-CoV-2 by FDA under an Emergency Use Authorization (EUA). This EUA will remain  in effect (meaning this test can be used) for the duration of the COVID-19 declaration under Section 564(b)(1) of the Act, 21 U.S.C.section 360bbb-3(b)(1), unless the authorization is terminated  or revoked sooner.  Influenza A by PCR NEGATIVE NEGATIVE Final   Influenza B by PCR NEGATIVE NEGATIVE Final    Comment: (NOTE) The Xpert Xpress SARS-CoV-2/FLU/RSV plus assay is intended as an aid in the diagnosis of influenza from Nasopharyngeal swab specimens and should not be used as a sole basis for treatment. Nasal washings and aspirates are unacceptable  for Xpert Xpress SARS-CoV-2/FLU/RSV testing.  Fact Sheet for Patients: bloggercourse.com  Fact Sheet for Healthcare Providers: seriousbroker.it  This test is not yet approved or cleared by the [place]  FDA and has been authorized for detection and/or diagnosis of SARS-CoV-2 by FDA under an Emergency Use Authorization (EUA). This EUA will remain in effect (meaning this test can be used) for the duration of the COVID-19 declaration under Section 564(b)(1) of the Act, 21 U.S.C. section 360bbb-3(b)(1), unless the authorization is terminated or revoked.     Resp Syncytial Virus by PCR NEGATIVE NEGATIVE Final    Comment: (NOTE) Fact Sheet for Patients: bloggercourse.com  Fact Sheet for Healthcare Providers: seriousbroker.it  This test is not yet approved or cleared by the [place]  FDA and has been authorized for detection and/or diagnosis of SARS-CoV-2 by FDA under an Emergency Use Authorization (EUA). This EUA will remain in effect (meaning this test can be used) for the duration of the COVID-19 declaration under Section 564(b)(1) of the Act, 21 U.S.C. section 360bbb-3(b)(1), unless the authorization is terminated or revoked.  Performed at [facility], [address], [place], [place] [address]   Respiratory (~20 pathogens) panel by PCR     Status: Abnormal   Collection Time: 12/20/23  9:15 AM   Specimen: Nasopharyngeal Swab; Respiratory  Result Value Ref Range Status   Adenovirus NOT DETECTED NOT DETECTED Final   Coronavirus 229E NOT DETECTED NOT DETECTED Final    Comment: (NOTE) The Coronavirus on the Respiratory Panel, DOES NOT test for the novel  Coronavirus (2019 nCoV)    Coronavirus HKU1 NOT DETECTED NOT DETECTED Final   Coronavirus NL63 NOT DETECTED NOT DETECTED Final   Coronavirus OC43 NOT DETECTED NOT DETECTED Final   Metapneumovirus NOT DETECTED NOT  DETECTED Final   Rhinovirus / Enterovirus NOT DETECTED NOT DETECTED Final   Influenza A NOT DETECTED NOT DETECTED Final   Influenza B NOT DETECTED NOT DETECTED Final   Parainfluenza Virus 1 NOT DETECTED NOT DETECTED Final   Parainfluenza Virus 2 NOT DETECTED NOT DETECTED Final   Parainfluenza Virus 3 DETECTED (A) NOT DETECTED Final   Parainfluenza Virus 4 NOT DETECTED NOT DETECTED Final   Respiratory Syncytial Virus NOT DETECTED NOT DETECTED Final   Bordetella pertussis NOT DETECTED NOT DETECTED Final   Bordetella Parapertussis NOT DETECTED NOT DETECTED Final   Chlamydophila pneumoniae NOT DETECTED NOT DETECTED Final   Mycoplasma pneumoniae NOT DETECTED NOT DETECTED Final    Comment: Performed at [facility], [address] [place], [place] [address]    Labs: CBC: Recent Labs  Lab 12/18/23 1734 12/20/23 0630 12/21/23 0545  WBC 8.3 7.3 8.8  NEUTROABS 4.8  --   --   HGB 13.1 12.7* 11.7*  HCT 40.0 38.1* 35.1*  MCV 100.5* 100.3* 98.9  PLT 185 164 164   Basic Metabolic Panel: Recent Labs  Lab 12/18/23 1734 12/20/23 0630 12/21/23 0545  NA 140 140 137  K 3.9 4.0 3.6  CL 105 106 104  CO2 22 28 25   GLUCOSE 154* 87 89  BUN 20 18 14   CREATININE 1.47* 1.43* 1.41*  CALCIUM  8.8* 8.9 8.3*  MG  --  1.9 1.8  PHOS  --  2.1*  --    Liver Function Tests: Recent Labs  Lab 12/18/23 1734 12/20/23 0630  AST 17 18  ALT 7 8  ALKPHOS 87 69  BILITOT 0.5 0.5  PROT 7.1 6.2*  ALBUMIN  3.9 3.5   CBG: Recent Labs  Lab 12/20/23 0807 12/20/23 1134 12/20/23 1659 12/20/23 2052 12/21/23 0729  GLUCAP 86 152* 103* 151* 95    Discharge time spent: greater than 30 minutes.  Signed: Alm Schneider, MD [facility] 12/21/2023

## 2023-12-21 NOTE — Plan of Care (Signed)

## 2023-12-21 NOTE — TOC Transition Note (Addendum)
 Transition of Care [facility]) - Discharge Note   Patient Details  Name: Philip Kerr MRN: [id number] Date of Birth: 1933/05/03  Transition of Care [facility]) CM/SW Contact:  Philip Kerr Phone Number: 12/21/2023, 11:33 AM   Clinical Narrative:     Patient is discharging home today with HHPT. Nurse advised CSW to call spouse. Spouse was called prior to going to see patient at bedside. Spouse agreeable to HHPT/RNwith Philip Kerr who accepts patient insurance. ICM signing off.   Final next level of care: Home w Home Health Services Barriers to Discharge: Barriers Resolved   Patient Goals and CMS Choice Patient states their goals for this hospitalization and ongoing recovery are:: return home CMS Medicare.gov Compare Post Acute Care list provided to:: Patient Represenative (must comment) (Spouse Philip Kerr) Choice offered to / list presented to : Spouse      Discharge Placement                  Name of family member notified: Philip Kerr Patient and family notified of of transfer: 12/21/23  Discharge Plan and Services Additional resources added to the After Visit Summary for                            [facility] Arranged: PT/RN HH Agency: [facility] Date [facility] Agency Contacted: 12/21/23 Time HH Agency Contacted: 1132 Representative spoke with at [facility] Agency: Philip Kerr  Social Drivers of Health (SDOH) Interventions SDOH Screenings   Food Insecurity: No Food Insecurity (12/20/2023)  Housing: Low Risk (12/20/2023)  Transportation Needs: No Transportation Needs (12/20/2023)  Utilities: Not At Risk (12/20/2023)  Alcohol  Screen: Low Risk (02/01/2023)  Depression (PHQ2-9): Low Risk (02/12/2023)  Financial Resource Strain: Low Risk (02/01/2023)  Physical Activity: Sufficiently Active (02/01/2023)  Social Connections: Socially Isolated (12/20/2023)  Stress: No Stress Concern Present (02/01/2023)  Tobacco Use: Low Risk (12/18/2023)  Health Literacy: Adequate Health Literacy (02/01/2023)      Readmission Risk Interventions    12/21/2023   11:31 AM 12/20/2023    3:01 PM  Readmission Risk Prevention Plan  Transportation Screening Complete Complete  Home Care Screening  Complete  Medication Review (RN CM)  Complete  HRI or Home Care Consult Complete   Social Work Consult for Recovery Care Planning/Counseling Complete   Palliative Care Screening Not Applicable   Medication Review Oceanographer) Complete

## 2023-12-23 ENCOUNTER — Encounter: Payer: Self-pay | Admitting: Internal Medicine

## 2023-12-23 ENCOUNTER — Ambulatory Visit: Payer: Self-pay

## 2023-12-23 ENCOUNTER — Ambulatory Visit: Admitting: Internal Medicine

## 2023-12-23 ENCOUNTER — Telehealth: Payer: Self-pay | Admitting: *Deleted

## 2023-12-23 VITALS — BP 136/6 | HR 83 | Temp 97.6°F | Ht 70.0 in | Wt 159.2 lb

## 2023-12-23 DIAGNOSIS — J449 Chronic obstructive pulmonary disease, unspecified: Secondary | ICD-10-CM

## 2023-12-23 DIAGNOSIS — Z8701 Personal history of pneumonia (recurrent): Secondary | ICD-10-CM | POA: Diagnosis not present

## 2023-12-23 DIAGNOSIS — R5381 Other malaise: Secondary | ICD-10-CM | POA: Insufficient documentation

## 2023-12-23 DIAGNOSIS — Z978 Presence of other specified devices: Secondary | ICD-10-CM | POA: Diagnosis not present

## 2023-12-23 DIAGNOSIS — J189 Pneumonia, unspecified organism: Secondary | ICD-10-CM

## 2023-12-23 NOTE — Assessment & Plan Note (Signed)
 Pt for exchange soon

## 2023-12-23 NOTE — Assessment & Plan Note (Signed)
Clinically resolved, cont to follow 

## 2023-12-23 NOTE — Progress Notes (Signed)
 Patient ID: Philip Kerr, male   DOB: [birth year], 89 y.o.   MRN: [id number]        Chief Complaint: follow up post hospn dec 10 - 13 with uti, pneumonia,       HPI:  Philip Kerr is a 89 y.o. male here with above, tx with IV antibiotic rocephin  and azithromycin , and resp panel + for parainfluenza.  Rocephin  d/c after mult orgs on urine culture, and had fosphomcyin on day of d/c.  Has plan for foley exchange next wk.  HH with PT to start tomorrow. Denies urinary symptoms such as dysuria, frequency, urgency, flank pain, hematuria or n/v, fever, chills.  Pt denies chest pain, increased sob or doe, wheezing, orthopnea, PND, increased LE swelling, palpitations, dizziness or syncope.     Wt Readings from Last 3 Encounters:  12/23/23 159 lb 3.2 oz (72.2 kg)  12/18/23 148 lb (67.1 kg)  10/13/23 167 lb 15.9 oz (76.2 kg)   BP Readings from Last 3 Encounters:  12/23/23 (!) 136/6  12/21/23 126/62  10/13/23 116/63         Past Medical History:  Diagnosis Date   Aortic insufficiency    Coronary artery disease    coronary artery bypass graft x 5 in 1996   Diabetes mellitus    Hypercholesterolemia    non-insulin  dependent   Hypertension    Ischemic cardiomyopathy    Mitral regurgitation    PVC (premature ventricular contraction)    Sinus pause 12/07/2020   s/p PPM   Past Surgical History:  Procedure Laterality Date   CORONARY ARTERY BYPASS GRAFT     LUNG SURGERY     OTHER SURGICAL HISTORY     percutaneous coronary intervention of the  posterolateral segment on 01/27/1998   PACEMAKER IMPLANT N/A 12/07/2020   Procedure: PACEMAKER IMPLANT;  Surgeon: Inocencio Soyla Lunger, MD;  Location: [facility];  Service: Cardiovascular;  Laterality: N/A;    reports that he has never smoked. He has never been exposed to tobacco smoke. He has never used smokeless tobacco. He reports that he does not currently use alcohol . He reports that he does not use drugs. family history is not on file. He  was adopted. Allergies[1] Medications Ordered Prior to Encounter[2]      ROS:  All others reviewed and negative.  Objective        PE:  BP (!) 136/6   Pulse 83   Temp 97.6 F (36.4 C)   Ht 5' 10 (1.778 m)   Wt 159 lb 3.2 oz (72.2 kg)   SpO2 98%   BMI 22.84 kg/m                 Constitutional: Pt appears in NAD               HENT: Head: NCAT.                Right Ear: External ear normal.                 Left Ear: External ear normal.                Eyes: . Pupils are equal, round, and reactive to light. Conjunctivae and EOM are normal               Nose: without d/c or deformity               Neck: Neck supple. Gross normal ROM  Cardiovascular: Normal rate and regular rhythm.                 Pulmonary/Chest: Effort normal and breath sounds without rales or wheezing.                Abd:  Soft, NT, ND, + BS, no organomegaly               Neurological: Pt is alert. At baseline orientation, motor grossly intact               Skin: Skin is warm. No rashes, no other new lesions, LE edema - none               Psychiatric: Pt behavior is normal without agitation   Micro: none  Cardiac tracings I have personally interpreted today:  none  Pertinent Radiological findings (summarize): none   Lab Results  Component Value Date   WBC 8.8 12/21/2023   HGB 11.7 (L) 12/21/2023   HCT 35.1 (L) 12/21/2023   PLT 164 12/21/2023   GLUCOSE 89 12/21/2023   CHOL 99 02/12/2023   TRIG 133.0 02/12/2023   HDL 40.30 02/12/2023   LDLCALC 32 02/12/2023   ALT 8 12/20/2023   AST 18 12/20/2023   NA 137 12/21/2023   K 3.6 12/21/2023   CL 104 12/21/2023   CREATININE 1.41 (H) 12/21/2023   BUN 14 12/21/2023   CO2 25 12/21/2023   TSH 2.510 12/20/2023   PSA 1.97 09/06/2017   INR 1.2 12/07/2020   HGBA1C 6.4 (H) 12/19/2023   MICROALBUR 4.2 07/17/2019   Assessment/Plan:  Philip Kerr is a 89 y.o. White or Caucasian [1] male with  has a past medical history of Aortic insufficiency,  Coronary artery disease, Diabetes mellitus, Hypercholesterolemia, Hypertension, Ischemic cardiomyopathy, Mitral regurgitation, PVC (premature ventricular contraction), and Sinus pause (12/07/2020).  Community acquired pneumonia of left lower lobe of lung Clinically resolved, cont to follow  Foley catheter in place Pt for exchange soon  Debility Improved, but pt for [facility] with PT start tomorrow  COPD (chronic obstructive pulmonary disease) (HCC) Stable, cont current inhaler prn asd  Followup: Return in about 6 months (around 06/22/2024).  Philip Rush, MD 12/23/2023 3:13 PM [facility]     [1] No Known Allergies [2]  Current Outpatient Medications on File Prior to Visit  Medication Sig Dispense Refill   acetaminophen  (TYLENOL ) 325 MG tablet Take 650 mg by mouth every 6 (six) hours as needed for moderate pain or headache.     Ascorbic Acid (VITAMIN C) 1000 MG tablet Take 1,000 mg by mouth daily.     aspirin  EC 81 MG tablet Take 1 tablet (81 mg total) by mouth daily. Swallow whole. 30 tablet 11   azithromycin  (ZITHROMAX ) 500 MG tablet Take 1 tablet (500 mg total) by mouth daily. 3 tablet 0   cefdinir  (OMNICEF ) 300 MG capsule Take 1 capsule (300 mg total) by mouth 2 (two) times daily. 7 capsule 0   Cholecalciferol (VITAMIN D3) 10 MCG (400 UNIT) tablet Take 400 Units by mouth daily.     Cinnamon 500 MG capsule Take 1,000 mg by mouth daily.     diphenhydramine-acetaminophen  (TYLENOL  PM) 25-500 MG TABS tablet Take 2 tablets by mouth at bedtime.     docusate sodium  (COLACE) 250 MG capsule Take 250 mg by mouth daily as needed for mild constipation.     doxazosin  (CARDURA ) 2 MG tablet TAKE 1  TABLET BY MOUTH EVERYDAY AT BEDTIME 90 tablet 2   folic acid  (FOLVITE ) 1 MG tablet Take 1 tablet (1 mg total) by mouth daily.     furosemide  (LASIX ) 20 MG tablet TAKE 1 TABLET BY MOUTH EVERY DAY 90 tablet 0   QUEtiapine  (SEROQUEL ) 50 MG  tablet TAKE 1/2 TAB BY MOUTH IN THE AM, AND 1 TAB AT BEDTIME 135 tablet 3   rosuvastatin  (CRESTOR ) 20 MG tablet TAKE 1 TABLET BY MOUTH EVERY DAY 90 tablet 3   tamsulosin  (FLOMAX ) 0.4 MG CAPS capsule TAKE 1 CAPSULE BY MOUTH EVERY DAY AFTER SUPPER 90 capsule 3   traZODone  (DESYREL ) 50 MG tablet TAKE 1/2 TO 1 TABLET BY MOUTH AT BEDTIME AS NEEDED FOR SLEEP 90 tablet 1   vitamin E 180 MG (400 UNITS) capsule Take 400 Units by mouth daily.     No current facility-administered medications on file prior to visit.

## 2023-12-23 NOTE — Transitions of Care (Post Inpatient/ED Visit) (Signed)
 12/23/2023  Name: Philip Kerr MRN: [id number] DOB: [birth year]  Today's TOC FU Call Status: Today's TOC FU Call Status:: Successful TOC FU Call Completed TOC FU Call Complete Date: 12/23/23  Patient's Name and Date of Birth confirmed. Name, DOB (per spouse/ caregiver [name]- verified on [facility] DPR)  Transition Care Management Follow-up Telephone Call Date of Discharge: 12/21/23 [facility] Type of Discharge: Inpatient Admission Primary Inpatient Discharge Diagnosis:: UTI How have you been since you were released from the hospital?: Same (He is doing fine, back to his stubborn self, but overall better; we are at the urology doctor's office now and going to see his PCP this afternoon; we don't need follow up calls- he wouldn't agree to talk to you anyway, he manages most of his own care) Any questions or concerns?: No  Items Reviewed: Did you receive and understand the discharge instructions provided?: Yes (briefly reviewed with patient's spouse who verbalizes good understanding of same: declined detailed review: currently at specialist provider appointment) Medications obtained,verified, and reconciled?: Partial Review Completed (Partial medication review completed with spouse; confirmed patient self-manages medications and spouse denies questions/ concerns around medications today: reports we will pick up the new antibiotics today as soon as we leave the doctor appointment) Medications Not Reviewed Reasons:: Other: Reason for Partial Mediation Review: Per patient's spouse declined full medication review: currently at specialist provider appointment Any new allergies since your discharge?: No Dietary orders reviewed?: Yes Type of Diet Ordered:: Pretty much regular food Do you have support at home?: Yes People in Home [RPT]: spouse Name of Support/Comfort Primary Source: Spouse reports independent in self-care activities; resides with supportive spouse  [name]: assists as/ if needed/ indicated  Medications Reviewed Today: Medications Reviewed Today     Reviewed by Jiselle Sheu M, RN (Registered Nurse) on 12/23/23 at 1238  Med List Status: <None>   Medication Order Taking? Sig Documenting Provider Last Dose Status Informant  acetaminophen  (TYLENOL ) 325 MG tablet [id number]  Take 650 mg by mouth every 6 (six) hours as needed for moderate pain or headache. [provider]  Active Spouse/Significant Other, Self, Pharmacy Records, Multiple Informants  Ascorbic Acid (VITAMIN C) 1000 MG tablet [id number]  Take 1,000 mg by mouth daily. [provider]  Active Spouse/Significant Other, Self, Pharmacy Records, Multiple Informants  aspirin  EC 81 MG tablet [id number]  Take 1 tablet (81 mg total) by mouth daily. Swallow whole. Lesia Ozell Barter, PA-C  Active Spouse/Significant Other, Self, Pharmacy Records, Multiple Informants  azithromycin  (ZITHROMAX ) 500 MG tablet [id number]  Take 1 tablet (500 mg total) by mouth daily. Evonnie Lenis, MD  Active   cefdinir  (OMNICEF ) 300 MG capsule [id number]  Take 1 capsule (300 mg total) by mouth 2 (two) times daily. Evonnie Lenis, MD  Active   Cholecalciferol (VITAMIN D3) 10 MCG (400 UNIT) tablet [id number]  Take 400 Units by mouth daily. [provider]  Active Spouse/Significant Other, Self, Pharmacy Records, Multiple Informants  Cinnamon 500 MG capsule [id number] Yes Take 1,000 mg by mouth daily. [provider]  Active Spouse/Significant Other, Self, Pharmacy Records, Multiple Informants  diphenhydramine-acetaminophen  (TYLENOL  PM) 25-500 MG TABS tablet [id number]  Take 2 tablets by mouth at bedtime. [provider]  Active Spouse/Significant Other, Self, Pharmacy Records, Multiple Informants  docusate sodium  (COLACE) 250 MG capsule [id number]  Take 250 mg by mouth daily as needed for mild constipation. [provider]  Active Spouse/Significant Other, Self, Pharmacy Records,  Multiple Informants  doxazosin  (CARDURA ) 2 MG tablet  [id number]  TAKE 1 TABLET BY MOUTH EVERYDAY AT BEDTIME Norleen Lynwood ORN, MD  Active Spouse/Significant Other, Self, Pharmacy Records, Multiple Informants  folic acid  (FOLVITE ) 1 MG tablet [id number]  Take 1 tablet (1 mg total) by mouth daily. Evonnie Lenis, MD  Active   furosemide  (LASIX ) 20 MG tablet [id number] Yes TAKE 1 TABLET BY MOUTH EVERY DAY Verlin Lonni BIRCH, MD  Active Spouse/Significant Other, Self, Pharmacy Records, Multiple Informants  QUEtiapine  (SEROQUEL ) 50 MG tablet [id number]  TAKE 1/2 TAB BY MOUTH IN THE AM, AND 1 TAB AT BEDTIME Norleen Lynwood ORN, MD  Active Spouse/Significant Other, Self, Pharmacy Records, Multiple Informants  rosuvastatin  (CRESTOR ) 20 MG tablet [id number]  TAKE 1 TABLET BY MOUTH EVERY DAY Verlin Lonni BIRCH, MD  Active Spouse/Significant Other, Self, Pharmacy Records, Multiple Informants  tamsulosin  (FLOMAX ) 0.4 MG CAPS capsule [id number] Yes TAKE 1 CAPSULE BY MOUTH EVERY DAY AFTER SUPPER Norleen Lynwood ORN, MD  Active Spouse/Significant Other, Self, Pharmacy Records, Multiple Informants  traZODone  (DESYREL ) 50 MG tablet [id number]  TAKE 1/2 TO 1 TABLET BY MOUTH AT BEDTIME AS NEEDED FOR SLEEP Norleen Lynwood ORN, MD  Active Spouse/Significant Other, Self, Pharmacy Records, Multiple Informants  vitamin E 180 MG (400 UNITS) capsule [id number]  Take 400 Units by mouth daily. [provider]  Active Spouse/Significant Other, Self, Pharmacy Records, Multiple Informants           Home Care and Equipment/Supplies: Were Home Health Services Ordered?: Yes Name of Home Health Agency:: [facility] Has Agency set up a time to come to your home?: Yes First Home Health Visit Date: 12/24/23 Any new equipment or medical supplies ordered?: No  Functional Questionnaire: Do you need assistance with bathing/showering or dressing?: No Do you need assistance with meal preparation?: No Do you need assistance with eating?: No Do you  have difficulty maintaining continence: No Do you need assistance with getting out of bed/getting out of a chair/moving?: No Do you have difficulty managing or taking your medications?: No  Follow up appointments reviewed: PCP Follow-up appointment confirmed?: Yes Date of PCP follow-up appointment?: 12/23/23 Follow-up Provider: PCP: Dr. Norleen Specialist [facility] Follow-up appointment confirmed?: Yes Date of Specialist follow-up appointment?: 12/23/23 Follow-Up Specialty Provider:: Urology provider: confirmed currently at office for appointment Do you need transportation to your follow-up appointment?: No Do you understand care options if your condition(s) worsen?: Yes-patient verbalized understanding  SDOH Interventions Today    Flowsheet Row Most Recent Value  SDOH Interventions   Food Insecurity Interventions Intervention Not Indicated  [per spouse report: entirety of TOC call completed with spouse]  Housing Interventions Intervention Not Indicated  [per spouse report: entirety of TOC call completed with spouse]  Transportation Interventions Intervention Not Indicated  [per spouse report: entirety of TOC call completed with spouse: spouse provides all trtansportation as per baseline]  Utilities Interventions Intervention Not Indicated  [per spouse report: entirety of TOC call completed with spouse]   See TOC assessment tabs for additional assessment/ TOC intervention information Provided education around expected/ usual side effects of antibiotics  Provided education around potential benefits of OTC probiotics in setting of antibiotic therapy- encouraged pt. to discuss if probiotics are indicated during HFU office visits Provided education/ reinforced signs/ symptoms UTI along with corresponding action plan in setting of chronic foley catheter; reinforced foley catheter care at home: spouse reports patient manages foley independently and makes sure he does everything he is supposed to;  confirms patient has been self-managing catheter care- maintenance for over 3 years now  Patient's spouse declines need for ongoing/ further care management outreach; declines enrollment in 30-day TOC program; provided my direct contact information should questions/ concerns/ needs arise post-TOC call   Pls call/ message for questions,  Krystalynn Ridgeway Mckinney Diogenes Whirley, RN, BSN, CCRN Alumnus RN Care Manager  Transitions of Care  [facility] - [facility] [phone]: direct office

## 2023-12-23 NOTE — Telephone Encounter (Signed)
 FYI Only or Action Required?: FYI only for provider: appointment scheduled on 12/15.  Patient was last seen in primary care on 02/12/2023 by Norleen Lynwood ORN, MD.  Called Nurse Triage reporting Fatigue.  Symptoms began several days ago.  Interventions attempted: Rest, hydration, or home remedies.  Symptoms are: unchanged.  Triage Disposition: See Physician Within 24 Hours  Patient/caregiver understands and will follow disposition?: Yes            Copied from CRM #[id number]. Topic: Clinical - Red Word Triage >> Dec 23, 2023  8:03 AM Eva FALCON wrote: Red Word that prompted transfer to Nurse Triage: discharged from hospital Saturday, he's still feeling weak, extremely fatigue. Reason for Disposition  [1] MODERATE weakness (e.g., interferes with work, school, normal activities) AND [2] persists > 3 days  Answer Assessment - Initial Assessment Questions 1. DESCRIPTION: Describe how you are feeling.      Wife states he is fatigued and has weakness from hospital visit on Saturday for a UTI. Patient has catheter.     2. SEVERITY: How bad is it?  Can you stand and walk?     Wife stated she just hopes she can get him to the car.   3. ONSET: When did these symptoms begin? (e.g., hours, days, weeks, months)     Ongoing x 3 days    4. CAUSE: What do you think is causing the weakness or fatigue? (e.g., not drinking enough fluids, medical problem, trouble sleeping)     Unsure     5. NEW MEDICINES:  Have you started on any new medicines recently? (e.g., opioid pain medicines, benzodiazepines, muscle relaxants, antidepressants, antihistamines, neuroleptics, beta blockers)     No     6. OTHER SYMPTOMS: Do you have any other symptoms? (e.g., chest pain, fever, cough, SOB, vomiting, diarrhea, bleeding, other areas of pain)  No    Patient's wife  called in to triage with complaints of fatigue and weakness in patient. This has been ongoing for several days. The  patient was diagnosed with a UTI on 12/10 during ED visit.   Appointment scheduled for further evaluation; and agrees with the plan of care, and will reach out if symptoms worsen or persist.  Protocols used: Weakness (Generalized) and Fatigue-A-AH

## 2023-12-23 NOTE — Assessment & Plan Note (Signed)
 Improved, but pt for [facility] with PT start tomorrow

## 2023-12-23 NOTE — Patient Instructions (Signed)
 Please continue all other medications as before, and refills have been done if requested.  Please have the pharmacy call with any other refills you may need.  Please continue your efforts at being more active, low cholesterol diet, and weight control.  Please keep your appointments with your specialists as you may have planned - urology, and PT at home  Please make an Appointment to return in 6 months, or sooner if needed

## 2023-12-23 NOTE — Assessment & Plan Note (Signed)
 Stable, cont current inhaler prn asd

## 2023-12-24 ENCOUNTER — Telehealth: Payer: Self-pay

## 2023-12-24 MED ORDER — DONEPEZIL HCL 5 MG PO TABS: 5.0000 mg | ORAL_TABLET | Freq: Every day | ORAL | 3 refills | Status: DC

## 2023-12-24 NOTE — Telephone Encounter (Signed)
 Copied from CRM #[id number]. Topic: Clinical - Medical Advice >> Dec 24, 2023  1:15 PM Mia F wrote: Reason for CRM: Pt wife [name] says that the physical therapist (or nurse) that came out to see pt yesterday suggested pt takes Aricept  for memory loss. They would like a script for this medication.   Please call [name] regarding this

## 2023-12-24 NOTE — Addendum Note (Signed)
 Addended by: NORLEEN LYNWOOD ORN on: 12/24/2023 04:27 PM   Modules accepted: Orders

## 2023-12-24 NOTE — Telephone Encounter (Signed)
 Ok sure this is done  Aricept  5 mg per day

## 2023-12-26 ENCOUNTER — Telehealth: Payer: Self-pay

## 2023-12-26 NOTE — Telephone Encounter (Signed)
 Verneita called in stating that upon patients discharge summary form hospital he was suppose to have BMP and CBC redrawn in one week.She stated that she can draw them next week at visit if provider would like her to. She will just need verbal approval along with orders that were called in on yesterday.

## 2023-12-26 NOTE — Telephone Encounter (Signed)
 Copied from CRM #[id number]. Topic: Clinical - Home Health Verbal Orders >> Dec 25, 2023  4:47 PM Rea ORN wrote: Caller/Agency: [facility] Callback Number: [phone], voicemail can be left (include name and title in the call) Service Requested: Skilled Nursing Frequency: weekly for 5 weeks, 1 prn if pt needs it Any new concerns about the patient? No

## 2023-12-27 NOTE — Telephone Encounter (Signed)
 Called and left voicemail giving verbals.

## 2023-12-27 NOTE — Telephone Encounter (Signed)
 Ok sure, ok for verbals

## 2024-01-02 ENCOUNTER — Other Ambulatory Visit: Payer: Self-pay | Admitting: Internal Medicine

## 2024-01-15 ENCOUNTER — Other Ambulatory Visit: Payer: Self-pay | Admitting: Internal Medicine

## 2024-01-23 ENCOUNTER — Other Ambulatory Visit: Payer: Self-pay | Admitting: Internal Medicine

## 2024-01-23 ENCOUNTER — Other Ambulatory Visit: Payer: Self-pay

## 2024-02-03 ENCOUNTER — Ambulatory Visit: Payer: Medicare HMO

## 2024-02-04 ENCOUNTER — Ambulatory Visit

## 2024-02-04 ENCOUNTER — Telehealth: Payer: Self-pay

## 2024-02-04 NOTE — Telephone Encounter (Signed)
 Please let Dr Norleen know that he passed away on 2024-03-18.

## 2024-02-04 NOTE — Telephone Encounter (Signed)
 Ok this is noted, no new orders  thanks

## 2024-02-09 DEATH — deceased

## 2024-03-10 ENCOUNTER — Ambulatory Visit

## 2024-06-09 ENCOUNTER — Ambulatory Visit
